# Patient Record
Sex: Female | Born: 1980
Health system: Southern US, Community
[De-identification: ages and names within clinical notes are randomized; demographics above are authoritative.]

## PROBLEM LIST (undated history)

## (undated) ENCOUNTER — Emergency Department (HOSPITAL_COMMUNITY): Discharge status: Absent without leave | Payer: Medicare Other

## (undated) DIAGNOSIS — F29 Unspecified psychosis not due to a substance or known physiological condition: Secondary | ICD-10-CM

## (undated) DIAGNOSIS — F419 Anxiety disorder, unspecified: Secondary | ICD-10-CM

## (undated) DIAGNOSIS — M549 Dorsalgia, unspecified: Secondary | ICD-10-CM

## (undated) DIAGNOSIS — R32 Unspecified urinary incontinence: Secondary | ICD-10-CM

## (undated) DIAGNOSIS — I4891 Unspecified atrial fibrillation: Secondary | ICD-10-CM

## (undated) DIAGNOSIS — F209 Schizophrenia, unspecified: Secondary | ICD-10-CM

## (undated) DIAGNOSIS — I499 Cardiac arrhythmia, unspecified: Secondary | ICD-10-CM

## (undated) DIAGNOSIS — F259 Schizoaffective disorder, unspecified: Secondary | ICD-10-CM

## (undated) DIAGNOSIS — F200 Paranoid schizophrenia: Secondary | ICD-10-CM

## (undated) DIAGNOSIS — G8929 Other chronic pain: Secondary | ICD-10-CM

## (undated) DIAGNOSIS — R440 Auditory hallucinations: Secondary | ICD-10-CM

## (undated) HISTORY — DX: Paranoid schizophrenia: F20.0

## (undated) HISTORY — DX: Unspecified psychosis not due to a substance or known physiological condition: F29

## (undated) HISTORY — DX: Schizoaffective disorder, unspecified: F25.9

## (undated) HISTORY — DX: Auditory hallucinations: R44.0

## (undated) HISTORY — DX: Unspecified atrial fibrillation: I48.91

---

## 2011-04-04 ENCOUNTER — Emergency Department (HOSPITAL_COMMUNITY)
Admission: EM | Admit: 2011-04-04 | Discharge: 2011-04-07 | Disposition: A | Discharge status: Other Acute Inpatient Hospital | Payer: Medicaid Other | Source: Home / Self Care | Attending: Emergency Medicine | Admitting: Emergency Medicine

## 2011-04-04 DIAGNOSIS — F29 Unspecified psychosis not due to a substance or known physiological condition: Secondary | ICD-10-CM | POA: Insufficient documentation

## 2011-04-04 LAB — DIFFERENTIAL
Basophils Absolute: 0 10*3/uL (ref 0.0–0.1)
Basophils Relative: 1 % (ref 0–1)
Eosinophils Absolute: 0.1 10*3/uL (ref 0.0–0.7)
Eosinophils Relative: 2 % (ref 0–5)
Monocytes Absolute: 0.4 10*3/uL (ref 0.1–1.0)
Monocytes Relative: 7 % (ref 3–12)

## 2011-04-04 LAB — URINE MICROSCOPIC-ADD ON

## 2011-04-04 LAB — BASIC METABOLIC PANEL
BUN: 12 mg/dL (ref 6–23)
Calcium: 10.1 mg/dL (ref 8.4–10.5)
GFR calc non Af Amer: >60 mL/min (ref >60)
Glucose, Bld: 103 mg/dL — ABNORMAL HIGH (ref 70–99)
Sodium: 135 mEq/L (ref 135–145)

## 2011-04-04 LAB — URINALYSIS, ROUTINE W REFLEX MICROSCOPIC
Specific Gravity, Urine: 1.006 (ref 1.005–1.030)
Urobilinogen, UA: 0.2 mg/dL (ref 0.0–1.0)
pH: 6.5 (ref 5.0–8.0)

## 2011-04-04 LAB — CBC
Hemoglobin: 14.6 g/dL (ref 12.0–15.0)
MCH: 30.2 pg (ref 26.0–34.0)
MCHC: 33.9 g/dL (ref 30.0–36.0)
Platelets: 219 10*3/uL (ref 150–400)
RDW: 12.6 % (ref 11.5–15.5)

## 2011-04-04 LAB — RAPID URINE DRUG SCREEN, HOSP PERFORMED
Opiates: NOT DETECTED
Tetrahydrocannabinol: NOT DETECTED

## 2011-04-06 ENCOUNTER — Emergency Department (HOSPITAL_COMMUNITY): Payer: Medicaid Other

## 2011-04-06 DIAGNOSIS — F29 Unspecified psychosis not due to a substance or known physiological condition: Secondary | ICD-10-CM

## 2011-04-07 ENCOUNTER — Inpatient Hospital Stay (HOSPITAL_COMMUNITY)
Admission: AD | Admit: 2011-04-07 | Discharge: 2011-04-14 | DRG: 885 | Disposition: A | Discharge status: Home / Self Care | Payer: Medicaid Other | Source: Ambulatory Visit | Attending: Psychiatry | Admitting: Psychiatry

## 2011-04-07 DIAGNOSIS — F29 Unspecified psychosis not due to a substance or known physiological condition: Secondary | ICD-10-CM

## 2011-04-07 DIAGNOSIS — Z733 Stress, not elsewhere classified: Secondary | ICD-10-CM

## 2011-04-07 NOTE — Consult Note (Addendum)
NAMEMILEIDY, ATKIN             ACCOUNT NO.:  [id number]  MEDICAL RECORD NO.:  [id number]  LOCATION:  WLED                         FACILITY:  [facility]  PHYSICIAN:  Shirin Echeverry T. Desani Sprung, M.D.   DATE OF BIRTH:  03/06/1981  DATE OF CONSULTATION: 04/06/11                                 CONSULTATION   HISTORY OF PRESENT ILLNESS:  The patient is 30 year old African-American female who is brought to the ED after the patient was experiencing severe psychosis and mother was concerned about her behavior. Apparently the patient has been hallucinating and talking to herself, believing that her baby daddy was killed by the family member.  The patient was given Geodon in the unit that helped somewhat.  However, the patient remains very psychotic, delusional and talking to herself.  The patient told that she has moved from [place] almost 4 months ago to live close to her mother who is in [place] and she believes that she has missed her injection, however, she mentioned that she missed her Depo injection so that she do not get pregnant.  She admitted since she has stopped the medication for past 3 months she is having lot of sleep issue.  She is scared, terrified and having nightmares.  The patient believes that lot of her family members have been doing conspiracy against her.  She has seen her baby daddy either killed or maybe tortured in detail.  The patient is very distraught, tearful and difficult to engage in the conversation.  The patient denies any previous psych history.  She does not recall seeing psychiatrist in the past.  PAST PSYCHIATRIC HISTORY:  At this time the patient is denying any previous psych history, however, the patient is very psychotic and did not provide much information.  MEDICAL HISTORY:  The patient endorsed sometimes she do get chest pain, however, there has been no documented active medical illness.  ALCOHOL AND SUBSTANCE USE HISTORY:  The patient's urine drug  screen was negative.  PSYCHOSOCIAL HISTORY:  The patient told that she was born in [place]. [place] but then she moved to [place] when she lived there for many years and recently she moved to [place] to live close to her mother.  The patient does not want to go to [place] as she remembers "bad things happen there."  The patient did not provide much information about her family history and psychosocial history.  MENTAL STATUS EXAM:  The patient is mildly obese female who is wearing the hospital PJs.  She is tearful, anxious and easily crying.  Her thought process was circumstantial and tangential.  She has poor concentration, poor attention.  She endorsed auditory hallucinations that people are calling her name.  She also endorsed paranoid delusions that her baby daddy has been killed by the family member or maybe he is tortured in the jail.  She is alert and oriented only x2.  She denies any suicidal thinking or homicidal thinking but appears very paranoid and psychotic.  At times she was seen talking to herself as responding to internal stimuli.  Her insight, judgment, impulse control is poor.  DIAGNOSIS:  AXIS I:  Psychosis, not otherwise specified. AXIS II:  Borderline  intellect. AXIS III:  See medical history. AXIS IV:  Severe. AXIS V:  20-25.  PLAN:  We will start the Haldol starting medication 5 mg twice a day. We will get increase collateral information.  At this time it is unclear if the patient has any previous psych history.  I would recommended her to have CT scan to rule out any organic cause of psychosis.  If she is medically cleared, we will admit this patient to [facility] for stabilization.  She will continue to get Geodon 20 mg IM for severe agitation.     Kathy Brown, M.D.     STA/MEDQ  D:  04/06/2011  T:  04/06/2011  Job:  [id number]  Electronically Signed by Kathryne Sharper M.D. on 04/07/2011 08:45:55 AM

## 2011-04-15 NOTE — Discharge Summary (Signed)
  NAMEKAMIA, INSALACO NO.:  [id number]  MEDICAL RECORD NO.:  [id number]  LOCATION:  0303                          FACILITY:  [facility]  PHYSICIAN:  Eulogio Ditch, MD DATE OF BIRTH:  1981/09/05  DATE OF ADMISSION:  04/07/2011 DATE OF DISCHARGE:  04/14/2011                              DISCHARGE SUMMARY   HISTORY OF PRESENT ILLNESS:  A 30 year old African American female who was admitted for hallucinations and paranoid behavior.  Patient was saying that family members are doing conspiracy against her.  HOSPITAL COURSE:  During the hospital stay, patient was started on 5 mg of Haldol which was increased to 10 mg at bedtime.  Patient was also given Cogentin 0.5 mg twice a day to prevent any EPS symptoms on the Haldol.  The patient responded to the medication well without any side effects.  The patient participated in all the groups.  No agitation was present during the hospital stay.  Patient's family was involved in the treatment.  At the time of discharge, the patient was not suicidal or homicidal, not delusional, not hallucinating.  She was very calm, cooperative, pleasant on approach.  Patient's insight improved and she was agreeable to take medications daily.  DISCHARGE DIAGNOSES:  AXIS I:  Psychosis, not otherwise specified. AXIS II:  Deferred. AXIS III:  No active medical issue. AXIS IV:  None. AXIS V:  50.  DISCHARGE MEDICATIONS: 1. Cogentin 0.5 mg at bedtime. 2. Haldol 10 mg at bedtime. 3. Ativan 1 mg 2 tablets at bedtime as needed for anxiety.  DISCHARGE FOLLOWUP:  She will follow with [facility], phone number [phone], appointment April 16, 2011, between 9 to 11 a.m.     Eulogio Ditch, MD     SA/MEDQ  D:  04/15/2011  T:  04/15/2011  Job:  [id number]  Electronically Signed by Eulogio Ditch  on 04/15/2011 09:58:36 AM

## 2011-04-26 ENCOUNTER — Emergency Department (HOSPITAL_COMMUNITY)
Admission: EM | Admit: 2011-04-26 | Discharge: 2011-04-29 | Disposition: A | Discharge status: Home / Self Care | Payer: Medicare Other | Attending: Emergency Medicine | Admitting: Emergency Medicine

## 2011-04-26 DIAGNOSIS — F29 Unspecified psychosis not due to a substance or known physiological condition: Secondary | ICD-10-CM

## 2011-04-26 DIAGNOSIS — Z79899 Other long term (current) drug therapy: Secondary | ICD-10-CM | POA: Insufficient documentation

## 2011-04-26 LAB — CK TOTAL AND CKMB (NOT AT ARMC)
CK, MB: 3 ng/mL (ref 0.3–4.0)
Relative Index: 1.2 (ref 0.0–2.5)
Total CK: 260 U/L — ABNORMAL HIGH (ref 7–177)

## 2011-04-26 LAB — POCT I-STAT, CHEM 8
BUN: 5 mg/dL — ABNORMAL LOW (ref 6–23)
Calcium, Ion: 1.16 mmol/L (ref 1.12–1.32)
Creatinine, Ser: 0.8 mg/dL (ref 0.50–1.10)
TCO2: 23 mmol/L (ref 0–100)

## 2011-04-26 LAB — RAPID URINE DRUG SCREEN, HOSP PERFORMED
Barbiturates: NOT DETECTED
Tetrahydrocannabinol: NOT DETECTED

## 2011-04-27 DIAGNOSIS — F29 Unspecified psychosis not due to a substance or known physiological condition: Secondary | ICD-10-CM

## 2011-05-02 ENCOUNTER — Inpatient Hospital Stay (HOSPITAL_COMMUNITY)
Admission: AD | Admit: 2011-05-02 | Discharge: 2011-05-02 | Disposition: A | Discharge status: Home / Self Care | Payer: Medicaid Other | Source: Ambulatory Visit | Attending: Obstetrics and Gynecology | Admitting: Obstetrics and Gynecology

## 2011-05-02 DIAGNOSIS — N938 Other specified abnormal uterine and vaginal bleeding: Secondary | ICD-10-CM | POA: Insufficient documentation

## 2011-05-02 DIAGNOSIS — N949 Unspecified condition associated with female genital organs and menstrual cycle: Secondary | ICD-10-CM | POA: Insufficient documentation

## 2011-05-02 LAB — CBC
HCT: 42.3 % (ref 36.0–46.0)
MCH: 30.5 pg (ref 26.0–34.0)
MCHC: 34 g/dL (ref 30.0–36.0)
MCV: 89.6 fL (ref 78.0–100.0)
RDW: 12.5 % (ref 11.5–15.5)

## 2011-05-02 LAB — WET PREP, GENITAL: Clue Cells Wet Prep HPF POC: NONE SEEN

## 2011-05-02 NOTE — H&P (Signed)
Kathy Brown, Kathy Brown NO.:  [id number]  MEDICAL RECORD NO.:  [id number]  LOCATION:  0405                          FACILITY:  [facility]  PHYSICIAN:  Vic Ripper, P.A.-C.DATE OF BIRTH:  [birth year]  DATE OF ADMISSION:  04/07/2011 DATE OF DISCHARGE:                      PSYCHIATRIC ADMISSION ASSESSMENT   This is a voluntary admission to the services of Dr. Rogers Blocker.  This is a 30 year old single [place] American female.  She was brought to the emergency department in florid psychosis.  She was hearing voices and seeing family members.  She was very paranoid, very concerned about her family members.  She also reported that her "baby's daddy" had been shot by her mother's boyfriend.  The patient's mother reported that the patient had been having delusional thinking along with auditory/visual hallucinations for about three days; this is new.  The patient's mother fears that she is a danger to others because she has been making threatening remarks and is not interpreting reality correctly.  The patient herself denied being suicidal.  PAST PSYCHIATRIC HISTORY:  There is no prior psych history as far as we know.  MEDICAL HISTORY:  She reports that occasionally she gets chest pain; however, there has been no documented active medical illness.  ALCOHOL AND DRUG HISTORY:  She denies any issues with substance abuse, and her UDS was negative.  PSYCHOSOCIAL HISTORY:  She was born in [place]. [place].  She then moved to [place], lived there for many years.  Recently has come to [place] to live close to her mother.  I am not sure what her child age is; despite asking her, she could not say.  MEDICATIONS:  She is not prescribed any.  MEDICAL PROBLEMS:  None are known.  DRUG ALLERGIES:  There are no known drug allergies.  POSITIVE PHYSICAL FINDINGS:  Well-developed, well-nourished African American female who appears her stated age.  She is, however, in distress  responding to auditory/visual hallucinations.  MENTAL STATUS EXAM:  Tonight the only clear conversation I could get out of her was that she wanted to shower.  She was drooling.  It was very difficult to understand her speech.  Dr. Lolly Mustache did interview her over in the emergency room.  She was in hospital pajamas.  She was tearful, anxious, labile, cried easily.  Her thought process was circumstantial and tangential.  She had poor concentration and attention.  However, she did acknowledge auditory hallucinations (people were calling her name). She also endorsed paranoid delusions that her baby's daddy had been killed by a family member or maybe he is being tortured in jail.  She was alert.  She was only oriented x2.  She denied any suicidal thinking or homicidal thinking, but she appeared very paranoid and psychotic. She was observed talking to herself and responding to internal stimuli. Her insight, judgment, impulse control were poor.  AXIS I:  First psychotic break, psychosis not otherwise specified. AXIS II:  Evaluate for borderline intellect. AXIS III:  She is reporting some chest pain but this could be anxiety. AXIS IV:  Severe. AXIS V:  20-25.  She was started on Haldol and Cogentin.  She was also ordered Geodon 20 mg IM for severe agitation.  To  my knowledge, she did not have to have that.  The plan is to admit for safety and stabilization, to adjust her meds as indicated.  We will have to get further data and see if her prehospitalization setting is appropriate once discharged.     Vic Ripper, P.A.-C.     MD/MEDQ  D:  04/07/2011  T:  04/08/2011  Job:  [id number]  Electronically Signed by Jaci Lazier ADAMS P.A.-C. on 04/10/2011 07:36:20 PM Electronically Signed by Eulogio Ditch  on 05/02/2011 07:31:16 AM

## 2011-05-03 LAB — GC/CHLAMYDIA PROBE AMP, GENITAL: Chlamydia, DNA Probe: NEGATIVE

## 2011-10-03 ENCOUNTER — Encounter: Payer: Self-pay | Admitting: *Deleted

## 2011-10-03 ENCOUNTER — Emergency Department (HOSPITAL_COMMUNITY)
Admission: EM | Admit: 2011-10-03 | Discharge: 2011-10-04 | Disposition: A | Discharge status: Home / Self Care | Payer: Medicare Other | Attending: Emergency Medicine | Admitting: Emergency Medicine

## 2011-10-03 DIAGNOSIS — R109 Unspecified abdominal pain: Secondary | ICD-10-CM | POA: Insufficient documentation

## 2011-10-03 DIAGNOSIS — R10816 Epigastric abdominal tenderness: Secondary | ICD-10-CM | POA: Insufficient documentation

## 2011-10-03 DIAGNOSIS — R1110 Vomiting, unspecified: Secondary | ICD-10-CM | POA: Insufficient documentation

## 2011-10-03 NOTE — ED Notes (Signed)
Pt in c/o abd pain with n/v x1 week

## 2011-10-04 ENCOUNTER — Emergency Department (HOSPITAL_COMMUNITY): Payer: Medicare Other

## 2011-10-04 ENCOUNTER — Encounter (HOSPITAL_COMMUNITY): Payer: Self-pay | Admitting: Emergency Medicine

## 2011-10-04 LAB — COMPREHENSIVE METABOLIC PANEL
AST: 17 U/L (ref 0–37)
Albumin: 4.2 g/dL (ref 3.5–5.2)
Alkaline Phosphatase: 62 U/L (ref 39–117)
Chloride: 100 mEq/L (ref 96–112)
Potassium: 3.6 mEq/L (ref 3.5–5.1)
Sodium: 139 mEq/L (ref 135–145)
Total Bilirubin: 0.4 mg/dL (ref 0.3–1.2)

## 2011-10-04 LAB — DIFFERENTIAL
Basophils Absolute: 0 10*3/uL (ref 0.0–0.1)
Basophils Relative: 1 % (ref 0–1)
Neutro Abs: 3.6 10*3/uL (ref 1.7–7.7)
Neutrophils Relative %: 56 % (ref 43–77)

## 2011-10-04 LAB — CBC
Hemoglobin: 14.6 g/dL (ref 12.0–15.0)
MCHC: 34.5 g/dL (ref 30.0–36.0)
RDW: 12.3 % (ref 11.5–15.5)

## 2011-10-04 MED ORDER — ONDANSETRON HCL 4 MG/2ML IJ SOLN
4.0000 mg | Freq: Once | INTRAMUSCULAR | Status: AC
  Administered 2011-10-04: 4 mg via INTRAVENOUS
  Filled 2011-10-04: qty 2

## 2011-10-04 MED ORDER — ONDANSETRON HCL 4 MG PO TABS: 4.0000 mg | ORAL_TABLET | Freq: Four times a day (QID) | ORAL | Status: AC

## 2011-10-04 MED ORDER — SODIUM CHLORIDE 0.9 % IV BOLUS (SEPSIS)
1000.0000 mL | Freq: Once | INTRAVENOUS | Status: AC
  Administered 2011-10-04: 1000 mL via INTRAVENOUS

## 2011-10-04 MED ORDER — HYDROMORPHONE HCL PF 1 MG/ML IJ SOLN
1.0000 mg | Freq: Once | INTRAMUSCULAR | Status: AC
  Administered 2011-10-04: 1 mg via INTRAVENOUS
  Filled 2011-10-04: qty 1

## 2011-10-04 MED ORDER — ONDANSETRON HCL 4 MG/2ML IJ SOLN
INTRAMUSCULAR | Status: AC
  Administered 2011-10-04: 4 mg
  Filled 2011-10-04: qty 2

## 2011-10-04 NOTE — ED Notes (Signed)
Ultrasound at bedside

## 2011-10-04 NOTE — ED Provider Notes (Signed)
History     CSN: [id number] Arrival date & time: 10/03/2011 11:04 PM   First MD Initiated Contact with Patient 10/04/11 0305      Chief Complaint  Patient presents with  . Emesis    (Consider location/radiation/quality/duration/timing/severity/associated sxs/prior treatment) Patient is a 30 y.o. female presenting with vomiting. The history is provided by the patient.  Emesis  This is a new problem. Episode onset: About a week ago. The problem occurs 2 to 4 times per day. The problem has not changed since onset.The emesis has an appearance of stomach contents. There has been no fever. Pertinent negatives include no abdominal pain, no arthralgias, no chills, no cough, no diarrhea, no fever, no headaches, no myalgias and no sweats. Risk factors: No recent travel or antibiotics or known sick contacts.   moderate in severity. Has some associated epigastric discomfort and no history of gallbladder problems. No radiation. No heartburn. No bloody or bilious emesis. Has not tried any medications for this and has no known aggravating or alleviating factors other than she is unable to hold anything down.  History reviewed. No pertinent past medical history.  History reviewed. No pertinent past surgical history.  History reviewed. No pertinent family history.  History  Substance Use Topics  . Smoking status: Never Smoker   . Smokeless tobacco: Not on file  . Alcohol Use: No    OB History    Grav Para Term Preterm Abortions TAB SAB Ect Mult Living                  Review of Systems  Constitutional: Negative for fever and chills.  HENT: Negative for neck pain and neck stiffness.   Eyes: Negative for pain.  Respiratory: Negative for cough and shortness of breath.   Cardiovascular: Negative for chest pain and palpitations.  Gastrointestinal: Positive for nausea and vomiting. Negative for abdominal pain, diarrhea, blood in stool and rectal pain.  Genitourinary: Negative for dysuria.    Musculoskeletal: Negative for myalgias, back pain and arthralgias.  Skin: Negative for rash.  Neurological: Negative for headaches.  All other systems reviewed and are negative.    Allergies  Review of patient's allergies indicates no known allergies.  Home Medications  No current outpatient prescriptions on file.  BP 109/50  Pulse 89  Temp(Src) 98.6 F (37 C) (Oral)  Resp 18  SpO2 96%  LMP 10/03/2011  Physical Exam  Constitutional: She is oriented to person, place, and time. She appears well-developed and well-nourished.  HENT:  Head: Normocephalic and atraumatic.  Eyes: Conjunctivae and EOM are normal. Pupils are equal, round, and reactive to light.  Neck: Trachea normal. Neck supple. No thyromegaly present.  Cardiovascular: Normal rate, regular rhythm, S1 normal, S2 normal and normal pulses.     No systolic murmur is present   No diastolic murmur is present  Pulses:      Radial pulses are 2+ on the right side, and 2+ on the left side.  Pulmonary/Chest: Effort normal and breath sounds normal. She has no wheezes. She has no rhonchi. She has no rales. She exhibits no tenderness.  Abdominal: Soft. Normal appearance and bowel sounds are normal. She exhibits no distension. There is no rebound, no guarding, no CVA tenderness and negative Murphy's sign.       Mild epigastric tenderness without any peritonitis  Musculoskeletal:       BLE:s Calves nontender, no cords or erythema, negative Homans sign  Neurological: She is alert and oriented to person, place, and  time. She has normal strength. No cranial nerve deficit or sensory deficit. GCS eye subscore is 4. GCS verbal subscore is 5. GCS motor subscore is 6.  Skin: Skin is warm and dry. No rash noted. She is not diaphoretic.  Psychiatric: Her speech is normal.       Cooperative and appropriate    ED Course  Procedures (including critical care time)  Labs Reviewed  COMPREHENSIVE METABOLIC PANEL - Abnormal; Notable for the  following:    Glucose, Bld 112 (*)    All other components within normal limits  CBC  DIFFERENTIAL  LIPASE, BLOOD   US Abdomen Complete  10/04/2011  *RADIOLOGY REPORT*  Clinical Data:  Right upper quadrant and epigastric pain with nausea and vomiting for 5 days.  COMPLETE ABDOMINAL ULTRASOUND  Comparison:  None  Findings:  Gallbladder:  No gallstones, gallbladder wall thickening, or pericholecystic fluid.  Common bile duct:  Normal caliber, with diameter measured at about 4 mm.  Only segmental visualization is available due to overlying bowel gas.  Liver:  Limited visualization with difficulty penetrating, possibly due to the patient's body habitus.  Infiltrative process such as fatty infiltration or cirrhosis are not excluded.  IVC:  Appears normal.  Pancreas:  The pancreas is mostly obscured by overlying bowel gas and is not well visualized.  Spleen:  Spleen measures 7.3 cm in length.  Parenchymal echotexture appears homogeneous.  Limited visualization due to overlying rib shadows.  Right Kidney:  Right kidney measures 10.8 cm length.  No hydronephrosis.  Left Kidney:  Left kidney measures 10.3 cm length.  No hydronephrosis.  Abdominal aorta:  Distal abdominal aorta is not visualized due to overlying bowel gas.  Visualized portions of the aorta demonstrate normal caliber.  IMPRESSION: Technically limited study due to the patient's body habitus and bowel gas.  No specific abnormality is identified.  Original Report Authenticated By: Marlon Pel, M.D.      MDM   IV fluids and Zofran provided. Serial ABD exams no peritonitis. Ultrasound obtained and gallbladder appears normal. Patient feeling much better at 7 AM and is requesting something to eat and she tolerates fluids well. She is improved and stable for discharge home and primary care followup as needed.        Sunnie Nielsen, MD 10/04/11 [phone]

## 2011-10-04 NOTE — ED Notes (Signed)
Pt vomitted after trying to eat.Small amount Zofran given

## 2011-10-04 NOTE — ED Notes (Signed)
Pt states she see Dr Stann Mainland at [facility]

## 2012-01-16 ENCOUNTER — Encounter (HOSPITAL_COMMUNITY): Payer: Self-pay | Admitting: Family Medicine

## 2012-01-16 ENCOUNTER — Emergency Department (HOSPITAL_COMMUNITY)
Admission: EM | Admit: 2012-01-16 | Discharge: 2012-01-16 | Disposition: A | Discharge status: Home / Self Care | Payer: Medicare Other | Attending: Emergency Medicine | Admitting: Emergency Medicine

## 2012-01-16 DIAGNOSIS — H9209 Otalgia, unspecified ear: Secondary | ICD-10-CM

## 2012-01-16 DIAGNOSIS — K0889 Other specified disorders of teeth and supporting structures: Secondary | ICD-10-CM

## 2012-01-16 DIAGNOSIS — K089 Disorder of teeth and supporting structures, unspecified: Secondary | ICD-10-CM | POA: Insufficient documentation

## 2012-01-16 MED ORDER — OXYCODONE-ACETAMINOPHEN 5-325 MG PO TABS
2.0000 | ORAL_TABLET | Freq: Once | ORAL | Status: AC
  Administered 2012-01-16: 2 via ORAL
  Filled 2012-01-16: qty 2

## 2012-01-16 MED ORDER — CARBAMIDE PEROXIDE 6.5 % OT SOLN
10.0000 [drp] | Freq: Once | OTIC | Status: AC
  Administered 2012-01-16: 10 [drp] via OTIC
  Filled 2012-01-16: qty 15

## 2012-01-16 NOTE — ED Provider Notes (Signed)
History     CSN: [id number]  Arrival date & time 01/16/12  Kathy Brown   First MD Initiated Contact with Patient 01/16/12 1943      No chief complaint on file.   (Consider location/radiation/quality/duration/timing/severity/associated sxs/prior treatment) HPI Comments: Patient reports that she has been having left upper dental pain and left ear pain for the past month.  She saw her dentist on 12/26/11 and was given a prescription for Amoxicillin and ibuprofen 800mg , which she reports that she is taking as directed.  She was referred to an oral surgeon in [facility], which she has not yet seen.  Patient is a 31 y.o. female presenting with tooth pain. The history is provided by the patient.  Dental PainThe primary symptoms include mouth pain. Primary symptoms do not include dental injury, oral bleeding, fever or shortness of breath. The symptoms are worsening.  Additional symptoms include: gum swelling, gum tenderness and ear pain. Additional symptoms do not include: purulent gums, trismus, facial swelling, trouble swallowing and drooling.    No past medical history on file.  No past surgical history on file.  No family history on file.  History  Substance Use Topics  . Smoking status: Never Smoker   . Smokeless tobacco: Not on file  . Alcohol Use: No    OB History    Grav Para Term Preterm Abortions TAB SAB Ect Mult Living                  Review of Systems  Constitutional: Negative for fever and chills.  HENT: Positive for ear pain. Negative for facial swelling, drooling, trouble swallowing, neck pain, neck stiffness and ear discharge.   Respiratory: Negative for shortness of breath.   Neurological: Negative for dizziness.    Allergies  Review of patient's allergies indicates no known allergies.  Home Medications   Current Outpatient Rx  Name Route Sig Dispense Refill  . AMOXICILLIN 500 MG PO CAPS Oral Take 500 mg by mouth 3 (three) times daily.    Marland Kitchen HALOPERIDOL 2 MG PO  TABS Oral Take 2 mg by mouth at bedtime.    . IBUPROFEN 800 MG PO TABS Oral Take 800 mg by mouth every 4 (four) hours as needed. Pain.      BP 145/86  Pulse 93  Temp(Src) 98.3 F (36.8 C) (Oral)  Resp 18  SpO2 96%  Physical Exam  Nursing note and vitals reviewed. Constitutional: She is oriented to person, place, and time. She appears well-developed and well-nourished. No distress.  HENT:  Head: Normocephalic and atraumatic. No trismus in the jaw.  Right Ear: Hearing, tympanic membrane, external ear and ear canal normal.  Left Ear: Hearing, tympanic membrane, external ear and ear canal normal.  Mouth/Throat: Uvula is midline, oropharynx is clear and moist and mucous membranes are normal. Abnormal dentition. No dental abscesses or uvula swelling. No oropharyngeal exudate, posterior oropharyngeal edema, posterior oropharyngeal erythema or tonsillar abscesses.       Poor dental hygiene. Pt able to open and close mouth with out difficulty. Airway intact. Uvula midline. Mild gingival swelling with tenderness over affected area, but no fluctuance. No swelling or tenderness of submental and submandibular regions.  Eyes: Conjunctivae and EOM are normal.  Neck: Normal range of motion and full passive range of motion without pain. Neck supple.  Cardiovascular: Normal rate, regular rhythm and normal heart sounds.   Pulmonary/Chest: Effort normal and breath sounds normal. No stridor. No respiratory distress. She has no wheezes.  Musculoskeletal: Normal  range of motion.  Lymphadenopathy:       Head (right side): No submental, no submandibular, no tonsillar, no preauricular and no posterior auricular adenopathy present.       Head (left side): No submental, no submandibular, no tonsillar, no preauricular and no posterior auricular adenopathy present.    She has no cervical adenopathy.  Neurological: She is alert and oriented to person, place, and time.  Skin: Skin is warm and dry. No rash noted. She  is not diaphoretic.    ED Course  Procedures (including critical care time)  Labs Reviewed - No data to display No results found.   No diagnosis found.  9:13 PM Patient's left ear completely occluded with wax.  Awaiting ear irrigation.    Ear irrigated and TM visualized.  Normal TM  MDM  Patient with toothache.  No gross abscess.  Exam unconcerning for Ludwig's angina or spread of infection.  Patient already prescribed Amoxicillin by dentist, therefore, patient not given any antibiotics today.  Urged patient to follow-up with oral surgeon as scheduled.         Pascal Lux Falman, PA-C 01/17/12 [phone]

## 2012-01-16 NOTE — Discharge Instructions (Signed)
You have a dental injury. Use the resource guide listed below to help you find a dentist if you do not already have one to followup with. It is very important that you get evaluated by a dentist as soon as possible. Call tomorrow to schedule an appointment. Use your pain medication as prescribed and do not operate heavy machinery while on pain medication. Note that your pain medication contains acetaminophen (Tylenol) & its is not reccommended that you use additional acetaminophen (Tylenol) while taking this medication. Take your full course of antibiotics. Read the instructions below.  Eat a soft or liquid diet and rinse your mouth out after meals with warm water. You should see a dentist or return here at once if you have increased swelling, increased pain or uncontrolled bleeding from the site of your injury.   SEEK MEDICAL CARE IF:   You have increased pain not controlled with medicines.   You have swelling around your tooth, in your face or neck.   You have bleeding which starts, continues, or gets worse.   You have a fever >101  If you are unable to open your mouth  RESOURCE GUIDE  Dental Problems  Patients with Medicaid: [facility]                     [facility] [address]                                           [address] Phone:  [phone]                                                  Phone:  [phone]  If unable to pay or uninsured, contact:  [facility] or [facility] to become qualified for the adult dental clinic.  Chronic Pain Problems Contact  [facility]  [phone] Patients need to be referred by their primary care doctor.  Insufficient Money for Medicine Contact [employer]:  call "211" or [facility] 271-5999.  No Primary Care Doctor Call [facility]  832-8000 Other agencies that provide inexpensive medical care    [facility]  832-8035    [facility]  832-7272    [facility]  [phone]    Women's Clinic  832-4777    [facility]  [phone]    [facility]  272-1050  Psychological Services [facility]  832-9600 [facility]  [phone] [facility]   800 853-5163 (emergency services 641-4993)  Substance Abuse Resources [facility]  336-882-2125 [facility] 336-784-9470 [facility] 336-285-9073 [facility] 336-845-3988 Residential & Outpatient Substance Abuse Program  800-659-3381  Abuse/Neglect [place] Child Abuse Hotline (336) 641-3795 [place] Child Abuse Hotline 800-378-5315 (After Hours)  Emergency Shelter [facility] (336) 271-5985  [facility] (336) 275-9566 [facility] (704) 372-4663  MRSA Hotline #:   832-7006    [place] Resources  [facility] [place]     [employer]                            [facility] [address] [place]                       [address]      [address]  [place]                                                [place]                            [place] Phone:  [phone]                                   Phone:  [phone]                 Phone:  [phone]  [facility] Phone:  342-8316  [place] Child Abuse Hotline (336) 342-1394 (336) 342-3537 (After Hours)    

## 2012-01-16 NOTE — ED Notes (Signed)
Patient states that her left ear has been hurting for "a month."

## 2012-01-18 NOTE — ED Provider Notes (Signed)
Medical screening examination/treatment/procedure(s) were performed by non-physician practitioner and as supervising physician I was immediately available for consultation/collaboration. Devoria Albe, MD, Armando Gang   Ward Givens, MD 01/18/12 Marlyne Beards

## 2012-07-29 ENCOUNTER — Emergency Department (HOSPITAL_COMMUNITY)
Admission: EM | Admit: 2012-07-29 | Discharge: 2012-07-29 | Disposition: A | Discharge status: Home Health | Payer: Medicare Other | Attending: Emergency Medicine | Admitting: Emergency Medicine

## 2012-07-29 ENCOUNTER — Encounter (HOSPITAL_COMMUNITY): Payer: Self-pay | Admitting: Emergency Medicine

## 2012-07-29 DIAGNOSIS — T85848A Pain due to other internal prosthetic devices, implants and grafts, initial encounter: Secondary | ICD-10-CM

## 2012-07-29 DIAGNOSIS — K029 Dental caries, unspecified: Secondary | ICD-10-CM | POA: Insufficient documentation

## 2012-07-29 DIAGNOSIS — K089 Disorder of teeth and supporting structures, unspecified: Secondary | ICD-10-CM | POA: Insufficient documentation

## 2012-07-29 MED ORDER — OXYCODONE-ACETAMINOPHEN 5-325 MG PO TABS
2.0000 | ORAL_TABLET | ORAL | Status: DC | PRN
Start: 2012-07-29 — End: 2013-05-20

## 2012-07-29 MED ORDER — PENICILLIN V POTASSIUM 500 MG PO TABS: 500.0000 mg | ORAL_TABLET | Freq: Four times a day (QID) | ORAL | Status: AC

## 2012-07-29 MED ORDER — OXYCODONE-ACETAMINOPHEN 5-325 MG PO TABS
2.0000 | ORAL_TABLET | Freq: Once | ORAL | Status: AC
  Administered 2012-07-29: 2 via ORAL
  Filled 2012-07-29: qty 2

## 2012-07-29 MED ORDER — CHLORHEXIDINE DIGLUCONATE 20 % SOLN: Status: DC

## 2012-07-29 NOTE — ED Provider Notes (Signed)
Medical screening examination/treatment/procedure(s) were performed by non-physician practitioner and as supervising physician I was immediately available for consultation/collaboration.   Kathy Brown. Osvaldo Lamping, MD 07/29/12 1411

## 2012-07-29 NOTE — ED Provider Notes (Signed)
History     CSN: [id number]  Arrival date & time 07/29/12  4098   First MD Initiated Contact with Patient 07/29/12 [phone]      Chief Complaint  Patient presents with  . Dental Pain    (Consider location/radiation/quality/duration/timing/severity/associated sxs/prior treatment) HPI Comments: Kathy Brown 31 y.o. female   The chief complaint is: Patient presents with:   Dental Pain    31 year old female the chief complaint of left sided maxillary pain. Tooth pain. Patient is currently on Haldol for unknown reasons. She is a very poor historian and has flat affect. Patient was seen here. This year for similar complaint and denies this. She says she does not remember. Patient states that she has had 2 days of worsening pain on the left side of her mouth. The maxillary region. She has poor dentition and multiple cavities broken teeth. Patient states that she is throbbing, constant pain on the left and some facial tenderness. Next to the nasal cavity. She denies any sore throat, difficulty swallowing, difficulty breathing. She does have some jaw pain, but no trismus. She denies otalgia. She had slight headache. Last night and difficulty sleeping due to pain. She denies fevers, chills, arthralgias, myalgias. She denies sinus pressure or pain. She denies chest pain or shortness of breath.    Patient is a 31 y.o. female presenting with tooth pain. The history is provided by the patient and medical records. The history is limited by the condition of the patient. No language interpreter was used.  Dental PainThe primary symptoms include mouth pain and headaches. Primary symptoms do not include dental injury, oral bleeding, oral lesions, fever, shortness of breath, sore throat, angioedema or cough. The symptoms began 2 days ago. The symptoms are worsening. The symptoms are recurrent. The symptoms occur constantly.  Mouth pain began 24 -48 hours ago. Mouth pain occurs constantly. Mouth pain is  worsening. Affected locations include: gum(s). At its highest the mouth pain was at 10/10. The mouth pain is currently at 10/10.  The headache developed gradually. The pain from the headache is at a severity of 4/10. Location/region(s) of the headache: left unilateral. Associated with: dental pain. The headache is not associated with aura, photophobia, eye pain, visual change, neck stiffness, paresthesias, weakness or loss of balance.  Additional symptoms include: dental sensitivity to temperature, gum swelling, gum tenderness, purulent gums, facial swelling and swollen glands. Additional symptoms do not include: trismus, jaw pain, trouble swallowing, pain with swallowing, excessive salivation, dry mouth, taste disturbance, smell disturbance, drooling, ear pain, hearing loss, nosebleeds, goiter and fatigue. Medical issues do not include: alcohol problem, smoking, chewing tobacco, immunosuppression, periodontal disease and cancer.    History reviewed. No pertinent past medical history.  History reviewed. No pertinent past surgical history.  No family history on file.  History  Substance Use Topics  . Smoking status: Never Smoker   . Smokeless tobacco: Not on file  . Alcohol Use: No    OB History    Grav Para Term Preterm Abortions TAB SAB Ect Mult Living                  Review of Systems  Constitutional: Negative for fever, chills and fatigue.  HENT: Positive for facial swelling. Negative for hearing loss, ear pain, nosebleeds, sore throat, drooling, trouble swallowing, neck stiffness and voice change.   Eyes: Negative for photophobia and pain.  Respiratory: Negative for cough and shortness of breath.   Cardiovascular: Negative for chest pain.  Gastrointestinal: Negative for  nausea, vomiting, abdominal pain and diarrhea.  Genitourinary: Negative for dysuria.  Musculoskeletal: Negative for myalgias, arthralgias and gait problem.  Neurological: Positive for headaches. Negative for  dizziness, weakness, light-headedness, paresthesias and loss of balance.    Allergies  Review of patient's allergies indicates no known allergies.  Home Medications   Current Outpatient Rx  Name Route Sig Dispense Refill  . ACETAMINOPHEN 500 MG PO TABS Oral Take 1,000 mg by mouth every 6 (six) hours as needed. For pain    . HALOPERIDOL 2 MG PO TABS Oral Take 2 mg by mouth at bedtime.    . CHLORHEXIDINE DIGLUCONATE 20 % SOLN  Swish for 30 seconds with one capful) after brushing your teeth, then spit. Repeat this twice a day, once in the morning and once at night after brushing your teeth. 100 mL 0  . OXYCODONE-ACETAMINOPHEN 5-325 MG PO TABS Oral Take 2 tablets by mouth every 4 (four) hours as needed for pain. 10 tablet 0  . PENICILLIN V POTASSIUM 500 MG PO TABS Oral Take 1 tablet (500 mg total) by mouth 4 (four) times daily. 40 tablet 0    BP 135/84  Pulse 103  Temp 99.1 F (37.3 C) (Oral)  Resp 18  SpO2 96%  Physical Exam  Constitutional: She is oriented to person, place, and time. She appears well-developed and well-nourished. No distress.  HENT:  Head: Normocephalic and atraumatic. No trismus in the jaw.  Mouth/Throat: Uvula is midline. No oral lesions. Abnormal dentition. Dental caries present. No dental abscesses, uvula swelling or lacerations.         Patient with multiple broken teeth. Dental caries and poor dentition. There is gingival erythema throughout. Patient is very tender to palpation of the comments along teeth 14 through 16. He is tender to palpation of the skin surface along the face. There are no areas of fluctuance noted. There is induration of the gum. Airway is clear and patent.  Eyes: Conjunctivae normal are normal. No scleral icterus.  Neck: Normal range of motion.  Cardiovascular: Normal rate, regular rhythm and normal heart sounds.  Exam reveals no gallop and no friction rub.   No murmur heard. Pulmonary/Chest: Effort normal and breath sounds normal. No  respiratory distress.  Abdominal: Soft. Bowel sounds are normal. She exhibits no distension and no mass. There is no tenderness. There is no guarding.  Musculoskeletal: Normal range of motion.  Lymphadenopathy:       Head (right side): Tonsillar adenopathy present.       Head (left side): Tonsillar adenopathy present.  Neurological: She is alert and oriented to person, place, and time. No cranial nerve deficit. Coordination normal.  Skin: Skin is warm and dry. She is not diaphoretic.    ED Course  Procedures (including critical care time)  Labs Reviewed - No data to display No results found.   1. Dental implant pain       MDM  Patient with multiple dental issues. I have given the patient. Oral oxycodone here in the hospital. I've also given her followup with a dentist and instructed that she should call within 24 hours of discharge. Patient seems to understand, however, due to  flat affect. It is unclear of her perceptions. I've also given the patient. Chlorhexidine and penicillin. Along with outpatient narcotics for pain relief. There is no sign of Ludwig's angina. Airway is patent. She is not running a fever. Believe her blood pressure and tachycardia were do to pain. Her discharge patient home with dental  followup        Arthor Captain, PA-C 07/29/12 1118

## 2012-07-29 NOTE — ED Notes (Signed)
Pt c/o left upper tooth and mouth pain with swelling X3d, denies fever, no airway compromise, no other complaints, NAD

## 2013-05-20 ENCOUNTER — Emergency Department (HOSPITAL_COMMUNITY)
Admission: EM | Admit: 2013-05-20 | Discharge: 2013-05-20 | Disposition: A | Discharge status: Home / Self Care | Payer: Medicare Other | Attending: Emergency Medicine | Admitting: Emergency Medicine

## 2013-05-20 ENCOUNTER — Encounter (HOSPITAL_COMMUNITY): Payer: Self-pay | Admitting: Emergency Medicine

## 2013-05-20 DIAGNOSIS — Z79899 Other long term (current) drug therapy: Secondary | ICD-10-CM | POA: Insufficient documentation

## 2013-05-20 DIAGNOSIS — F411 Generalized anxiety disorder: Secondary | ICD-10-CM | POA: Insufficient documentation

## 2013-05-20 DIAGNOSIS — M549 Dorsalgia, unspecified: Secondary | ICD-10-CM | POA: Insufficient documentation

## 2013-05-20 HISTORY — DX: Anxiety disorder, unspecified: F41.9

## 2013-05-20 MED ORDER — ONDANSETRON 4 MG PO TBDP
8.0000 mg | ORAL_TABLET | Freq: Once | ORAL | Status: AC
  Administered 2013-05-20: 8 mg via ORAL
  Filled 2013-05-20: qty 2

## 2013-05-20 MED ORDER — HYDROCODONE-ACETAMINOPHEN 5-325 MG PO TABS: 1.0000 | ORAL_TABLET | Freq: Four times a day (QID) | ORAL | Status: DC | PRN

## 2013-05-20 MED ORDER — PROMETHAZINE HCL 25 MG PO TABS: 25.0000 mg | ORAL_TABLET | Freq: Four times a day (QID) | ORAL | Status: DC | PRN

## 2013-05-20 MED ORDER — HYDROCODONE-ACETAMINOPHEN 5-325 MG PO TABS
2.0000 | ORAL_TABLET | Freq: Once | ORAL | Status: AC
  Administered 2013-05-20: 2 via ORAL
  Filled 2013-05-20: qty 2

## 2013-05-20 NOTE — ED Notes (Signed)
Patient came out of the room again and states "I need a doctor, I need something to eat".   Patient redirected back to room and advised that PA has to come in to see her first.

## 2013-05-20 NOTE — ED Notes (Signed)
Patient states that "I am ready for dinner now".   Patient advised nothing by mouth until after PA sees her.

## 2013-05-20 NOTE — ED Notes (Signed)
staes her 7 year has been sleeping with her and has been  Kicking her in the back and her duaghter has to sleep with her also and it makes her back hurt

## 2013-05-20 NOTE — ED Provider Notes (Signed)
CSN: [id number]     Arrival date & time 05/20/13  1247 History  This chart was scribed for non-physician practitioner working with Suzi Roots, MD, by Ardelia Mems ED Scribe. This patient was seen in room TR07C/TR07C and the patient's care was started at 3:09 PM.   None    Chief Complaint  Patient presents with  . Back Pain    The history is provided by the patient. No language interpreter was used.   HPI Comments: Kathy Brown is a 32 y.o. female who presents to the Emergency Department complaining of gradual onset, gradually worsening, constant, moderate, sharp, non-radiating back pain over the past week. She states that her two children have been sleeping in her bed and have been kicking her in her sleep, causing her this pain. She denies any injury or recent trauma, and she denies having a history of back pain. She states that she has tried nothing to relieve her pain. She did not drive to the ED. She denies illicit drug use or history of cancer. She denies bowel or bladder incontinence, tingling, numbness, fever or any other symptoms.  PCP- None  Past Medical History  Diagnosis Date  . Anxiety    No past surgical history on file. No family history on file. History  Substance Use Topics  . Smoking status: Never Smoker   . Smokeless tobacco: Not on file  . Alcohol Use: No   OB History   Grav Para Term Preterm Abortions TAB SAB Ect Mult Living                 Review of Systems  Constitutional: Negative for fever.  Gastrointestinal:       Denies bowel incontinence.  Genitourinary:       Denies bladder incontinence.  Musculoskeletal: Positive for back pain.  Neurological: Negative for numbness.       Denies tingling.  All other systems reviewed and are negative.    Allergies  Review of patient's allergies indicates no known allergies.  Home Medications   Current Outpatient Rx  Name  Route  Sig  Dispense  Refill  . benztropine (COGENTIN) 0.5 MG tablet    Oral   Take 0.5 mg by mouth 2 (two) times daily.         . haloperidol (HALDOL) 2 MG tablet   Oral   Take 2 mg by mouth at bedtime.          Triage Vitals: BP 137/79  Pulse 100  Temp(Src) 98.1 F (36.7 C)  Resp 16  SpO2 99%  Filed Vitals:   05/20/13 1537  BP: 119/86  Pulse: 83  Temp: 97.7 F (36.5 C)  Resp: 20     Physical Exam  Nursing note and vitals reviewed. Constitutional: She is oriented to person, place, and time. She appears well-developed and well-nourished. No distress.  HENT:  Head: Normocephalic and atraumatic.  Right Ear: External ear normal.  Left Ear: External ear normal.  Nose: Nose normal.  Mouth/Throat: Oropharynx is clear and moist.  Eyes: Conjunctivae are normal.  Neck: Normal range of motion.  Cardiovascular: Normal rate, regular rhythm, normal heart sounds and intact distal pulses.   Pulmonary/Chest: Effort normal and breath sounds normal. No stridor. No respiratory distress. She has no wheezes. She has no rales.  Abdominal: Soft. She exhibits no distension.  Musculoskeletal: Normal range of motion.       Cervical back: She exhibits tenderness and pain. She exhibits normal range of motion, no bony  tenderness and no deformity.       Thoracic back: She exhibits normal range of motion, no tenderness and no bony tenderness.       Lumbar back: She exhibits normal range of motion, no tenderness and no bony tenderness.  Tender to palpation in paraspinal muscles of back. Full ROM of neck.  Neurological: She is alert and oriented to person, place, and time. She has normal strength.  Strength normal.  Skin: Skin is warm and dry. She is not diaphoretic. No erythema.  Psychiatric: She has a normal mood and affect. Her behavior is normal.    ED Course   Procedures (including critical care time)  DIAGNOSTIC STUDIES: Oxygen Saturation is 99% on RA, normal by my interpretation.    COORDINATION OF CARE: 3:15 PM- Pt advised of plan for treatment with  Norco and Zofran in the ED, along with plan to receive prescriptions upon discharge and pt agrees.  Medications  HYDROcodone-acetaminophen (NORCO/VICODIN) 5-325 MG per tablet 2 tablet (not administered)  ondansetron (ZOFRAN-ODT) disintegrating tablet 8 mg (not administered)    Labs Reviewed - No data to display  No results found.  1. Back pain     MDM  Patient with back pain.  No neurological deficits and normal neuro exam.  Patient can walk.  No loss of bowel or bladder control.  No concern for cauda equina.  No fever, night sweats, weight loss, h/o cancer, IVDU.  RICE protocol and pain medicine indicated and discussed with patient.     I personally performed the services described in this documentation, which was scribed in my presence. The recorded information has been reviewed and is accurate.     Mora Bellman, PA-C 05/20/13 [phone]

## 2013-05-20 NOTE — ED Notes (Signed)
Brought patient to room and asked what brought her in.  She states that she has had upper back pain x 2 wks.   I asked patient if she had been injured any way, she started crying and said "not that I know of".   Patient asked if she had taken anything at home for her pain.  She advised "I don't have anything there".

## 2013-05-24 NOTE — ED Provider Notes (Signed)
Medical screening examination/treatment/procedure(s) were performed by non-physician practitioner and as supervising physician I was immediately available for consultation/collaboration.   Cassadie Pankonin E Xerxes Agrusa, MD 05/24/13 0832 

## 2014-01-13 ENCOUNTER — Emergency Department (HOSPITAL_COMMUNITY)
Admission: EM | Admit: 2014-01-13 | Discharge: 2014-01-13 | Disposition: A | Discharge status: Home / Self Care | Payer: Medicare Other | Attending: Emergency Medicine | Admitting: Emergency Medicine

## 2014-01-13 ENCOUNTER — Encounter (HOSPITAL_COMMUNITY): Payer: Self-pay | Admitting: Emergency Medicine

## 2014-01-13 DIAGNOSIS — Z79899 Other long term (current) drug therapy: Secondary | ICD-10-CM | POA: Insufficient documentation

## 2014-01-13 DIAGNOSIS — F419 Anxiety disorder, unspecified: Secondary | ICD-10-CM

## 2014-01-13 DIAGNOSIS — F411 Generalized anxiety disorder: Secondary | ICD-10-CM | POA: Insufficient documentation

## 2014-01-13 DIAGNOSIS — F39 Unspecified mood [affective] disorder: Secondary | ICD-10-CM | POA: Insufficient documentation

## 2014-01-13 DIAGNOSIS — R443 Hallucinations, unspecified: Secondary | ICD-10-CM | POA: Insufficient documentation

## 2014-01-13 DIAGNOSIS — Z760 Encounter for issue of repeat prescription: Secondary | ICD-10-CM | POA: Insufficient documentation

## 2014-01-13 MED ORDER — HALOPERIDOL 2 MG PO TABS: 2.0000 mg | ORAL_TABLET | Freq: Every day | ORAL | Status: DC

## 2014-01-13 MED ORDER — BENZTROPINE MESYLATE 0.5 MG PO TABS: 0.5000 mg | ORAL_TABLET | Freq: Two times a day (BID) | ORAL | Status: DC

## 2014-01-13 NOTE — ED Notes (Signed)
Per pt, states she has been out of meds and mother states she has been "hollering and acting crazy"-states she is a client at [facility] but has not been

## 2014-01-13 NOTE — ED Provider Notes (Signed)
Medical screening examination/treatment/procedure(s) were performed by non-physician practitioner and as supervising physician I was immediately available for consultation/collaboration.   EKG Interpretation None        Richardean Canalavid H Yao, MD 01/13/14 1550

## 2014-01-13 NOTE — Discharge Instructions (Signed)
Please call your doctor for a followup appointment within 24-48 hours. When you talk to your doctor please let them know that you were seen in the emergency department and have them acquire all of your records so that they can discuss the findings with you and formulate a treatment plan to fully care for your new and ongoing problems. Please call and set-up an appointment with your primary physician at [facility] to be seen within 24-48 hours Please call and set-up an appointment with [facility] to be seen and re-assessed within 24-48 hours Please take medications as prescribed Please rest and stay hydrated Please continue to monitor symptoms and if symptoms are to worsen or change (fever greater than 101, chills, sweating, nausea, vomiting, diarrhea, chest pain, shortness of breath, difficulty breathing, confusion, auditory or visual hallucinations, thoughts of hurting herself or thoughts of hurting others, increased depression) please report back to the ED immediately   Medication Refill, Emergency Department We have refilled your medication today as a courtesy to you. It is best for your medical care, however, to take care of getting refills done through your primary caregiver's office. They have your records and can do a better job of follow-up than we can in the emergency department. On maintenance medications, we often only prescribe enough medications to get you by until you are able to see your regular caregiver. This is a more expensive way to refill medications. In the future, please plan for refills so that you will not have to use the emergency department for this. Thank you for your help. Your help allows [place] to better take care of the daily emergencies that enter our department. Document Released: 01/30/2004 Document Revised: 01/05/2012 Document Reviewed: 10/13/2005 [facility] Patient Information 2014 [place], [place].   Emergency Department Resource Guide 1) Find a Doctor and Pay Out of  Pocket Although you won't have to find out who is covered by your insurance plan, it is a good idea to ask around and get recommendations. You will then need to call the office and see if the doctor you have chosen will accept you as a new patient and what types of options they offer for patients who are self-pay. Some doctors offer discounts or will set up payment plans for their patients who do not have insurance, but you will need to ask so you aren't surprised when you get to your appointment.  2) Contact Your Local Health Department Not all health departments have doctors that can see patients for sick visits, but many do, so it is worth a call to see if yours does. If you don't know where your local health department is, you can check in your phone book. The CDC also has a tool to help you locate your state's health department, and many state websites also have listings of all of their local health departments.  3) Find a Walk-in Clinic If your illness is not likely to be very severe or complicated, you may want to try a walk in clinic. These are popping up all over the country in pharmacies, drugstores, and shopping centers. They're usually staffed by nurse practitioners or physician assistants that have been trained to treat common illnesses and complaints. They're usually fairly quick and inexpensive. However, if you have serious medical issues or chronic medical problems, these are probably not your best option.  No Primary Care Doctor: - Call Health Connect at  [phone] - they can help you locate a primary care doctor that  accepts your insurance,  provides certain services, etc. - Physician Referral Service- [phone]  Chronic Pain Problems: Organization         Address  Phone   Notes  [facility]  [phone] Patients need to be referred by their primary care doctor.   Medication Assistance: Organization         Address  Phone   Notes  [facility]  Medication [facility] [address] [place] [place], [place] [address] [phone] --Must be a resident of [facility] -- Must have NO insurance coverage whatsoever (no Medicaid/ Medicare, etc.) -- The pt. MUST have a primary care doctor that directs their care regularly and follows them in the community   [facility]ist  [phone]   [employer]  [phone]    Agencies that provide inexpensive medical care: Organization         Address  Phone   Notes  [facility]  [phone]   [facility]    [phone]   [facility] [address] [place], [place] [address] [phone]   [facility] [address] [place] [phone]   Planned Parenthood    [phone]   [facility]    [phone]   [facility]  [address], [place] Phone:  [phone], Fax:  [phone] Hours of Operation:  9 am - 6 pm, M-F.  Also accepts Medicaid/Medicare and self-pay.  [facility]  [address], [place] Phone: [phone], Fax: [phone]. Hours of Operation:  8:30 am - 5:30 pm, M-F.  Also accepts Medicaid and self-pay.  [facility] [place] [address] [place] Phone: [phone]   [facility] [address] Natasha Bence [place], [place] [phone], Ext. [phone] Mondays & Thursdays: 7-9 AM.  First 15 patients are seen on a first come, first serve basis.    Medicaid-accepting [facility] [place] [facility] Providers:  Organization         Address  Phone   Notes  [facility] [address], [place] [phone] Also accepts self-pay patients.  [facility] [address] Laurell Josephs [employer], [place]  [phone]   [facility] [address], [place] [phone]   [facility] [address] [place] [phone]   Renaye Rakers [address], [place]   [phone] Only accepts [place] Access [place] patients after they have their name applied to their card.   Self-Pay (no insurance) in [facility]:  Organization         Address  Phone   Notes  Sickle Cell Patients, [facility] [address] [employer], [place] [phone]   [facility] [address] [place], [place] [phone]   [facility] [place]  [address], [place] [phone]   [facility]/Dr. Osei-Bonsu  [address], [place] or [address], [place] [phone] Phone number for both [place] and [place] locations is the same.  [facility]gent [facility] and [facility] [address] [facility] [phone]   [facility] [address] [place] or [address] [phone]   [facility] [address] [place] [phone], phone; [phone], fax Sees patients 1st and 3rd Saturday of every month.  Must not qualify for public or private insurance (i.e. Medicaid, Medicare, [place] Health Choice, Veterans' Benefits)  Household income should be no more than 200% of the poverty level The clinic cannot treat you if you are pregnant or think you are pregnant  Sexually transmitted diseases are not treated at the clinic.    Dental Care: Organization         Address  Phone  Notes  [facility] of [facility] [address] [place], [place] [phone] Accepts children up to age 18 who are enrolled in [place] or [place] Health Choice; pregnant women with a Medicaid card; and children who have applied for Medicaid or [place] Health Choice, but were declined, whose parents can pay a reduced fee at time of service.  [facility] Department of [facility]  [address], [place]  [phone] Accepts children up to age 50 who are enrolled in [place] or [place] Health Choice; pregnant women with a Medicaid card; and children who have applied for Medicaid or [place] Health Choice, but were declined, whose parents can pay a reduced fee at time of service.  [facility]  [address] [place], [place] [phone] Patients are seen by appointment only. Walk-ins are not accepted. [facility] will see patients 72 years of age and older. Monday - Tuesday (8am-5pm) Most Wednesdays (8:30-5pm) $30 per visit, cash only  [facility] Adult Dental Access PROGRAM  [address], [facility] [phone] Patients are seen by appointment only. Walk-ins are not accepted. [facility] will see patients 94 years of age and older. One Wednesday Evening (Monthly: Volunteer Based).  $30 per visit, cash only  [employer]  [phone] for adults; Children under age 25, call Graduate Pediatric Dentistry at [phone]. Children aged 70-14, please call [phone] to request a pediatric application.  Dental services are provided in all areas of dental care including fillings, crowns and bridges, complete and partial dentures, implants, gum treatment, root canals, and extractions. Preventive care is also provided. Treatment is provided to both adults and children. Patients are selected via a lottery and there is often a waiting list.   [facility] [address], [place]  [phone] [email or web address]   [facility] [address] [place], [place] [phone], Ext. [phone] Second and Fourth Thursday of each month, opens at 6:30 AM; Clinic ends at 9 AM.  Patients are seen on a first-come first-served basis, and a limited number are seen during each clinic.   [facility] [place] [facility]  [address] Ether Griffins [place], [place] [phone]   Eligibility Requirements You must have lived in [place], [place], or [place]  counties for at least the last three months.   You cannot be eligible for state or federal sponsored [place], including [employer], [place], or [employer].   You generally cannot be eligible for healthcare insurance through your employer.    How to apply: Eligibility screenings are held every Tuesday and Wednesday afternoon from 1:00 pm until 4:00 pm. You do not need an appointment for the interview!  [facility] [address] [place], [place] [phone]   [facility] Health Department  [phone]   [facility] Health Department  [phone]   [facility] Health Department  [phone]  Behavioral Health Resources in the Community: Intensive Outpatient Programs Organization         Address  Phone  Notes  [facility] [address], [place], [place] [phone]   [facility] [address], [place], [place] [phone]   [facility] [address], [place], [place]  [phone]   [facility] [address],  [place], [place] [phone] or [phone]   Substance Abuse Resources Organization         Address  Phone  Notes  [facility]  [phone]   [facility]  [phone]   [facility]  [phone]   [facility]  [phone]   Residential & Outpatient Substance Abuse Program  [phone]   Psychological Services Organization         Address  Phone  Notes  [facility]  [phone]- [phone]   [facility]  [phone]- [phone]   [facility] [address], [place] [phone] or [phone]    Mobile Crisis Teams Organization         Address  Phone  Notes  [facility], Mobile Crisis Care Unit  [phone]   [facility]  [address] [place], [place] [phone]   Doristine LocksSharon DeEsch [address] [place]  [place] [phone]    Self-Help/Support Groups Organization         Address  Phone             Notes  Mental Health Assoc. of  - variety of support groups  [phone] Call for more information  Narcotics Anonymous (NA), [facility] [address], [place]  2 meetings at this location   Statisticianesidential Treatment Programs Organization         Address  Phone  Notes  [facility] [address],    [place]  [phone]   [facility]  [address], [place] [phone], [place], [place] [phone]   [facility] [address], [place] [phone] Admissions: 8am-3pm M-F  [facility] [address]    [place], [place] [phone]   [facility] [address], [place], [place] [phone]   [facility] [address]  [place], [place] [phone]   [facility] - Intensive Outpatient [address], [place], [place] [phone]   [facility] ([facility] [address]  [place], [place] [phone] or [phone]   [facility] ([facility]) [address] [place], [place] [phone] Accepts Medicaid  [facility] [address]  [place] [phone] Substance Abuse/Addiction Treatment   [facility] Resources Organization         Address  Phone  Notes  [facility]  [phone]) [phone]   Angie FavaJulie Brannon, PhD [address] [place], [place]   [phone](336) [phone] or [phone]) [phone]   [facility]   [address] [place], [place] [phone]) [phone]   [facility] [address], [place], [place] [phone](336) [phone] Insurance/Medicaid/sponsorship through [employer] [facility]  [facility] [address]                                    [place], [place] [phone]) [phone] Therapy/tele-psych/case  [facility] [address]   [place],  [place] [phone]    Dr. Lolly Mustache  [phone]   [facility] [place]  [employer] [facility] Dept. 1) [address] [place] 2) [address] [place] 3)  [address], [place] [phone]  [phone]   [facility] Child Abuse Hotline ([phone] or [phone] (After Hours)

## 2014-01-13 NOTE — ED Notes (Signed)
Pt escorted to discharge window. Verbalized understanding discharge instructions. In no acute distress.   

## 2014-01-13 NOTE — ED Provider Notes (Signed)
CSN: [id number]     Arrival date & time 01/13/14  4098 History   First MD Initiated Contact with Patient 01/13/14 1007     Chief Complaint  Patient presents with  . Medical Clearance     (Consider location/radiation/quality/duration/timing/severity/associated sxs/prior Treatment) The history is provided by the patient and a parent. No language interpreter was used.  Kathy Brown is a 33 y/o F with PMHx of anxiety presenting to the ED with mother who reported that patient was seen by a nurse yesterday and was told to come into the ED to get assessed and be an inpatient regarding her psychological issues. Patient reported that she is on Haldol and has not taken her medications in over a month. Mother reported that the patient lives with her and stated that she has been acting out, hollering, yelling, and reported people coming after her. Mother reported that she had an episode yesterday and this morning. Mother reported that this is normal to have these episodes when she is not on her medications for a long period of time. Patient reported that she is a patient at [facility] and [place]. When asked if she has reached out to any of these places to be seen regarding her medications to be refilled, both patient and mother declined. Denied homicidal ideation, suicidal ideation, chest pain, shortness of breath, difficulty breathing, abdominal pain, nausea, vomiting, diarrhea. PCP Barney Drain  Past Medical History  Diagnosis Date  . Anxiety    History reviewed. No pertinent past surgical history. No family history on file. History  Substance Use Topics  . Smoking status: Never Smoker   . Smokeless tobacco: Not on file  . Alcohol Use: No   OB History   Grav Para Term Preterm Abortions TAB SAB Ect Mult Living                 Review of Systems  Constitutional: Negative for fever and chills.  Respiratory: Negative for chest tightness and shortness of breath.   Cardiovascular: Negative  for chest pain.  Gastrointestinal: Negative for nausea, vomiting, abdominal pain and diarrhea.  Neurological: Negative for weakness and headaches.  Psychiatric/Behavioral: Positive for hallucinations and dysphoric mood. Negative for suicidal ideas and self-injury.  All other systems reviewed and are negative.      Allergies  Review of patient's allergies indicates no known allergies.  Home Medications   Current Outpatient Rx  Name  Route  Sig  Dispense  Refill  . benztropine (COGENTIN) 0.5 MG tablet   Oral   Take 1 tablet (0.5 mg total) by mouth 2 (two) times daily.   20 tablet   0   . haloperidol (HALDOL) 2 MG tablet   Oral   Take 1 tablet (2 mg total) by mouth at bedtime.   10 tablet   0   . HYDROcodone-acetaminophen (NORCO/VICODIN) 5-325 MG per tablet   Oral   Take 1 tablet by mouth every 6 (six) hours as needed for pain.   6 tablet   0   . promethazine (PHENERGAN) 25 MG tablet   Oral   Take 1 tablet (25 mg total) by mouth every 6 (six) hours as needed for nausea.   12 tablet   0    BP 128/88  Pulse 77  Temp(Src) 98.2 F (36.8 C) (Oral)  Resp 16  SpO2 100%  LMP 12/16/2013 Physical Exam  Nursing note and vitals reviewed. Constitutional: She is oriented to person, place, and time. She appears well-developed and well-nourished. No  distress.  HENT:  Head: Normocephalic and atraumatic.  Mouth/Throat: Oropharynx is clear and moist. No oropharyngeal exudate.  Eyes: Conjunctivae and EOM are normal. Pupils are equal, round, and reactive to light. Right eye exhibits no discharge. Left eye exhibits no discharge.  Neck: Normal range of motion. Neck supple. No tracheal deviation present.  Negative neck stiffness Negative nuchal rigidity Negative cervical lymphadenopathy  Cardiovascular: Normal rate, regular rhythm and normal heart sounds.  Exam reveals no friction rub.   No murmur heard. Pulmonary/Chest: Effort normal and breath sounds normal. No respiratory  distress. She has no wheezes. She has no rales.  Patient able to speak in full sentences without difficulty Negative use of accessory muscles  Abdominal: Soft. Bowel sounds are normal. There is no tenderness. There is no guarding.  Musculoskeletal: Normal range of motion.  Full ROM to upper and lower extremities without difficulty noted, negative ataxia noted.  Lymphadenopathy:    She has no cervical adenopathy.  Neurological: She is alert and oriented to person, place, and time. No cranial nerve deficit. She exhibits normal muscle tone. Coordination normal.  Cranial nerves III-XII grossly intact Strength 5+/5+ to upper and lower extremities bilaterally with resistance applied, equal distribution noted  Skin: Skin is warm and dry. No rash noted. She is not diaphoretic. No erythema.  Psychiatric:  Flat affect  Monotone Low toned speech     ED Course  Procedures (including critical care time)  10:23 AM This provider spoke with Dr. Cheri Rous regarding case. As per physician, agreed that patient does not need to go through the process of medical clearance - reported that since patient in no HI/SI patient can be discharged with medications and referrals. As per physician, patient to be discharged with medications.   10:49 AM This provider discussed plan for discharge in great detail with patient and mother. Patient and mother understood. Mother reported that she will hold the medications and administer the medication since patient does not take them on her own. Patient reports that she normally does not take them on her own even when she has medication filled. Discussed with patient importance of taking her own medications. Discussed importance of following up with an outpatient. Patient denied suicidal ideation, homicidal ideation, thoughts of self injury.  Labs Review Labs Reviewed - No data to display Imaging Review No results found.   EKG Interpretation None      MDM   Final  diagnoses:  Medication refill  Anxiety   Filed Vitals:   01/13/14 0935  BP: 128/88  Pulse: 77  Temp: 98.2 F (36.8 C)  TempSrc: Oral  Resp: 16  SpO2: 100%    Patient presenting to the ED, with mother regarding patient having outbursts of hollering, yelling, people chasing after her that has been ongoing for the past month. Mother reported that patient has been out of her medications for over a month. Patient reported that she is a patient at [facility] and [facility] - stated that she has not reached out to either facilities regarding medications to be refilled.  Alert and oriented. GCS 15. Heart rate and rhythm normal. Lungs clear to auscultation to upper and lower lobes bilaterally. Radial and DP pulses 2+ bilaterally. Bowel sounds are normoactive in all 4 quadrants-soft upon palpation-benign abdominal exam. Full range of motion to upper and lower extremities bilaterally without difficulty noted. Strength intact to equal grip strength. Flat affect. Monotone, low toned speech. Patient presenting to the ED with request for medication refill-mother reported that nurse  that saw the patient yesterday recommended patient to be admitted as an inpatient. Patient has not been taking her medications for almost a month-patient is [facility], mother and patient reported that neither of these facilities have been called or notified regarding medications or to be reassessed. Patient denies homicidal and suicidal ideation. This provider spoke with attending physician who agreed with patient to be discharged with medications and referrals. Patient stable, afebrile. Patient appears not to be a harm to herself or others. Will discharge patient with medications - haldol and cogentin that patient has been on for years, as per patient and mother report. Discussed with patient to [facility] with [facility]-phone numbers given in discharge paperwork. Discussed with patient  to followup. Discussed with patient to rest and stay hydrated. Discussed with patient importance of medication maintenance. Discussed with patient to closely monitor symptoms and if symptoms are to worsen or change to report back to the ED - strict return instructions given.  Patient agreed to plan of care, understood, all questions answered.   Raymon MuttonMarissa Aftin Lye, PA-C 01/13/14 [address]1113  Tela Kotecki, PA-C 01/13/14 1118

## 2014-01-29 ENCOUNTER — Emergency Department (HOSPITAL_COMMUNITY)
Admission: EM | Admit: 2014-01-29 | Discharge: 2014-01-29 | Disposition: A | Discharge status: Home / Self Care | Payer: Medicare Other | Attending: Emergency Medicine | Admitting: Emergency Medicine

## 2014-01-29 DIAGNOSIS — F22 Delusional disorders: Secondary | ICD-10-CM | POA: Insufficient documentation

## 2014-01-29 DIAGNOSIS — Z79899 Other long term (current) drug therapy: Secondary | ICD-10-CM | POA: Insufficient documentation

## 2014-01-29 DIAGNOSIS — F411 Generalized anxiety disorder: Secondary | ICD-10-CM | POA: Insufficient documentation

## 2014-01-29 DIAGNOSIS — F209 Schizophrenia, unspecified: Secondary | ICD-10-CM | POA: Insufficient documentation

## 2014-01-29 DIAGNOSIS — F259 Schizoaffective disorder, unspecified: Secondary | ICD-10-CM

## 2014-01-29 LAB — CBC
HCT: 39.3 % (ref 36.0–46.0)
HEMOGLOBIN: 13 g/dL (ref 12.0–15.0)
MCH: 29.6 pg (ref 26.0–34.0)
MCHC: 33.1 g/dL (ref 30.0–36.0)
MCV: 89.5 fL (ref 78.0–100.0)
PLATELETS: 253 10*3/uL (ref 150–400)
RBC: 4.39 MIL/uL (ref 3.87–5.11)
RDW: 14 % (ref 11.5–15.5)
WBC: 6.9 10*3/uL (ref 4.0–10.5)

## 2014-01-29 LAB — BASIC METABOLIC PANEL
BUN: 8 mg/dL (ref 6–23)
CALCIUM: 9.6 mg/dL (ref 8.4–10.5)
CO2: 28 meq/L (ref 19–32)
Chloride: 100 mEq/L (ref 96–112)
Creatinine, Ser: 0.75 mg/dL (ref 0.50–1.10)
GFR calc Af Amer: >90 mL/min (ref >90)
GFR calc non Af Amer: >90 mL/min (ref >90)
GLUCOSE: 101 mg/dL — AB (ref 70–99)
POTASSIUM: 4.3 meq/L (ref 3.7–5.3)
SODIUM: 139 meq/L (ref 137–147)

## 2014-01-29 LAB — RAPID URINE DRUG SCREEN, HOSP PERFORMED
Amphetamines: NOT DETECTED
BENZODIAZEPINES: NOT DETECTED
Barbiturates: NOT DETECTED
COCAINE: NOT DETECTED
OPIATES: NOT DETECTED
TETRAHYDROCANNABINOL: NOT DETECTED

## 2014-01-29 LAB — ETHANOL: Alcohol, Ethyl (B): <11 mg/dL (ref 0–11)

## 2014-01-29 MED ORDER — LORAZEPAM 1 MG PO TABS: 1.0000 mg | ORAL_TABLET | Freq: Three times a day (TID) | ORAL | Status: DC | PRN

## 2014-01-29 MED ORDER — IBUPROFEN 200 MG PO TABS: 600.0000 mg | ORAL_TABLET | Freq: Three times a day (TID) | ORAL | Status: DC | PRN

## 2014-01-29 MED ORDER — ACETAMINOPHEN 325 MG PO TABS: 650.0000 mg | ORAL_TABLET | ORAL | Status: DC | PRN

## 2014-01-29 MED ORDER — ONDANSETRON HCL 4 MG PO TABS: 4.0000 mg | ORAL_TABLET | Freq: Three times a day (TID) | ORAL | Status: DC | PRN

## 2014-01-29 MED ORDER — NICOTINE 21 MG/24HR TD PT24: 21.0000 mg | MEDICATED_PATCH | Freq: Every day | TRANSDERMAL | Status: DC

## 2014-01-29 MED ORDER — ZOLPIDEM TARTRATE 5 MG PO TABS: 5.0000 mg | ORAL_TABLET | Freq: Every evening | ORAL | Status: DC | PRN

## 2014-01-29 MED ORDER — ALUM & MAG HYDROXIDE-SIMETH 200-200-20 MG/5ML PO SUSP: 30.0000 mL | ORAL | Status: DC | PRN

## 2014-01-29 NOTE — ED Provider Notes (Signed)
CSN: [id number]     Arrival date & time 01/29/14  0825 History   First MD Initiated Contact with Patient 01/29/14 [phone]     Chief Complaint  Patient presents with  . Medical Clearance     (Consider location/radiation/quality/duration/timing/severity/associated sxs/prior Treatment) HPI Pt presenting with c/o feeling paranoid.  Pt called 911 she states that she hasn't been able to sleep in several days.  She states she feels that someone will come into her house.  She states that yesterday she was talking with neighbors in the yard and a car came speeding up her driveway.  She also talks of being suspicious of [employer] coming in front of her house.  Pt denies having auditory hallucinations.  Denies SI/HI.  States she has been very stressed and cannot sleep.  She denies substance use.  Per her mother patient has been more paranoid, and mother has been keeping her children away from her because she feels patient is not doing well.  Denies recent illness, no fever, no vomiting or diarrhea, no cough. There are no other associated systemic symptoms, there are no other alleviating or modifying factors.   Past Medical History  Diagnosis Date  . Anxiety    No past surgical history on file. No family history on file. History  Substance Use Topics  . Smoking status: Never Smoker   . Smokeless tobacco: Not on file  . Alcohol Use: No   OB History   Grav Para Term Preterm Abortions TAB SAB Ect Mult Living                 Review of Systems ROS reviewed and all otherwise negative except for mentioned in HPI    Allergies  Review of patient's allergies indicates no known allergies.  Home Medications   Current Outpatient Rx  Name  Route  Sig  Dispense  Refill  . benztropine (COGENTIN) 0.5 MG tablet   Oral   Take 1 tablet (0.5 mg total) by mouth 2 (two) times daily.   20 tablet   0   . haloperidol (HALDOL) 2 MG tablet   Oral   Take 1 tablet (2 mg total) by mouth at bedtime.   10 tablet   0     BP 136/86  Pulse 98  Temp(Src) 98.3 F (36.8 C) (Oral)  Resp 16  SpO2 98%  LMP 12/16/2013 Vitals reviewed Physical Exam Physical Examination: General appearance - alert, well appearing, and in no distress Mental status - alert, oriented to person, place, and time Eyes - no conjunctival injection, no scleral icterus Mouth - mucous membranes moist, pharynx normal without lesions Chest - clear to auscultation, normal respiratory effort Heart - normal rate, regular rhythm, normal S1, S2, no murmurs, rubs, clicks or gallops Extremities - peripheral pulses normal, no pedal edema, no clubbing or cyanosis Skin - normal coloration and turgor, no rashes Psych- tangential thoughts, poor concentration, cooperative  ED Course  Procedures (including critical care time) Labs Review Labs Reviewed  BASIC METABOLIC PANEL - Abnormal; Notable for the following:    Glucose, Bld 101 (*)    All other components within normal limits  CBC  URINE RAPID DRUG SCREEN (HOSP PERFORMED)  ETHANOL   Imaging Review No results found.   EKG Interpretation None      MDM   Final diagnoses:  Paranoia  Schizophrenic disorder    Pt presenting with paranoia, decreased sleep.  No SI/HI.  Pt is medically cleared, psych holding orders written.  Pt seen  by psych NP who has discharged patient.  [facility].      Ethelda ChickMartha K Linker, MD 01/29/14 [phone]

## 2014-01-29 NOTE — ED Notes (Signed)
Pt's mother has been called to come pick her up multiple times.  States that she wants us to "call 911 and have them take her home".  After it was explained how EMS works, pt's mother stated that she would have pt's brother pick her up in 30 mins.

## 2014-01-29 NOTE — Consult Note (Signed)
[facility] Face-to-Face Psychiatry Consult   Reason for Consult:  Schizophrenia Referring Physician:  EDP  Kathy Brown is an 33 y.o. female. Total Time spent with patient: 45 minutes  Assessment: AXIS I:  Schizoaffective Disorder AXIS II:  Deferred AXIS III:   Past Medical History  Diagnosis Date  . Anxiety    AXIS IV:  other psychosocial or environmental problems AXIS V:  61-70 mild symptoms  Plan:  No evidence of imminent risk to self or others at present.   Patient does not meet criteria for psychiatric inpatient admission. Supportive therapy provided about ongoing stressors. Discussed crisis plan, support from social network, calling 911, coming to the Emergency Department, and calling Suicide Hotline.  Subjective:   Kathy Brown is a 33 y.o. female patient admitted with Schizoaffective Disorder.  HPI:  Patient states that she lives with her mom and her 3 kids. "They not home last night.  They stay at grandma's house so they can go to church together.  I was at home by myself. I don't like to stay there by myself."  Patient denies suicidal/homicidal ideation and psychosis. Patient does states that she feel like someone will come into home and get her when she is alone.  Patient stated that she has a history of hearing voices but is not hearing any at this time.  When asked "No I don't hear nothing but you (interviewer); No I just see the TV."  Patient states that she is taking her medication and is compliant; Mom helps with medication. Patient mother called and states that she is willing to pick patient up.  HPI Elements:   Location:  Paranoia. Quality:  Afraid to be home alone   . Severity:  Afraid to be home alone and called police. Timing:  Last night.  Past Psychiatric History: Past Medical History  Diagnosis Date  . Anxiety     reports that she has never smoked. She does not have any smokeless tobacco history on file. She reports that she does not drink alcohol or use  illicit drugs. No family history on file.         Allergies:  No Known Allergies  ACT Assessment Complete:  No:   Past Psychiatric History: Diagnosis:  Paranoia and history of Schizophrenia  Hospitalizations:  Yes  Outpatient Care:  Yes  Substance Abuse Care:  Denies  Self-Mutilation:  Denies  Suicidal Attempts:  Denies  Homicidal Behaviors:  Denies   Violent Behaviors:  Denies    Place of Residence:  [place] Marital Status:  Single Employed/Unemployed:  Unemployed/Disability Education:   Family Supports:  Mother Objective: Blood pressure 122/79, pulse 96, temperature 98.3 F (36.8 C), temperature source Oral, resp. rate 14, last menstrual period 12/16/2013, SpO2 96.00%.There is no height or weight on file to calculate BMI. Results for orders placed during the hospital encounter of 01/29/14 (from the past 72 hour(s))  CBC     Status: None   Collection Time    01/29/14  9:54 AM      Result Value Ref Range   WBC 6.9  4.0 - 10.5 K/uL   RBC 4.39  3.87 - 5.11 MIL/uL   Hemoglobin 13.0  12.0 - 15.0 g/dL   HCT 39.3  36.0 - 46.0 %   MCV 89.5  78.0 - 100.0 fL   MCH 29.6  26.0 - 34.0 pg   MCHC 33.1  30.0 - 36.0 g/dL   RDW 14.0  11.5 - 15.5 %   Platelets 253  150 -  400 K/uL  BASIC METABOLIC PANEL     Status: Abnormal   Collection Time    01/29/14  9:54 AM      Result Value Ref Range   Sodium 139  137 - 147 mEq/L   Potassium 4.3  3.7 - 5.3 mEq/L   Chloride 100  96 - 112 mEq/L   CO2 28  19 - 32 mEq/L   Glucose, Bld 101 (*) 70 - 99 mg/dL   BUN 8  6 - 23 mg/dL   Creatinine, Ser 0.75  0.50 - 1.10 mg/dL   Calcium 9.6  8.4 - 10.5 mg/dL   GFR calc non Af Amer >90  >90 mL/min   GFR calc Af Amer >90  >90 mL/min   Comment: (NOTE)     The eGFR has been calculated using the CKD EPI equation.     This calculation has not been validated in all clinical situations.     eGFR's persistently <90 mL/min signify possible Chronic Kidney     Disease.  URINE RAPID DRUG SCREEN (HOSP  PERFORMED)     Status: None   Collection Time    01/29/14  9:54 AM      Result Value Ref Range   Opiates NONE DETECTED  NONE DETECTED   Cocaine NONE DETECTED  NONE DETECTED   Benzodiazepines NONE DETECTED  NONE DETECTED   Amphetamines NONE DETECTED  NONE DETECTED   Tetrahydrocannabinol NONE DETECTED  NONE DETECTED   Barbiturates NONE DETECTED  NONE DETECTED   Comment:            DRUG SCREEN FOR MEDICAL PURPOSES     ONLY.  IF CONFIRMATION IS NEEDED     FOR ANY PURPOSE, NOTIFY LAB     WITHIN 5 DAYS.                LOWEST DETECTABLE LIMITS     FOR URINE DRUG SCREEN     Drug Class       Cutoff (ng/mL)     Amphetamine      1000     Barbiturate      200     Benzodiazepine   846     Tricyclics       659     Opiates          300     Cocaine          300     THC              50  ETHANOL     Status: None   Collection Time    01/29/14  9:54 AM      Result Value Ref Range   Alcohol, Ethyl (B) <11  0 - 11 mg/dL   Comment:            LOWEST DETECTABLE LIMIT FOR     SERUM ALCOHOL IS 11 mg/dL     FOR MEDICAL PURPOSES ONLY   Labs are reviewed and are pertinent for no critical findings.  Medication reviewed and no changes made  Current Facility-Administered Medications  Medication Dose Route Frequency Provider Last Rate Last Dose  . acetaminophen (TYLENOL) tablet 650 mg  650 mg Oral Q4H PRN Threasa Beards, MD      . alum & mag hydroxide-simeth (MAALOX/MYLANTA) 200-200-20 MG/5ML suspension 30 mL  30 mL Oral PRN Threasa Beards, MD      . ibuprofen (ADVIL,MOTRIN) tablet 600 mg  600 mg Oral Q8H PRN  Threasa Beards, MD      . LORazepam (ATIVAN) tablet 1 mg  1 mg Oral Q8H PRN Threasa Beards, MD      . nicotine (NICODERM CQ - dosed in mg/24 hours) patch 21 mg  21 mg Transdermal Daily Threasa Beards, MD      . ondansetron [facility]) tablet 4 mg  4 mg Oral Q8H PRN Threasa Beards, MD      . zolpidem (AMBIEN) tablet 5 mg  5 mg Oral QHS PRN Threasa Beards, MD       Current Outpatient  Prescriptions  Medication Sig Dispense Refill  . benztropine (COGENTIN) 0.5 MG tablet Take 1 tablet (0.5 mg total) by mouth 2 (two) times daily.  20 tablet  0  . haloperidol (HALDOL) 2 MG tablet Take 1 tablet (2 mg total) by mouth at bedtime.  10 tablet  0    Psychiatric Specialty Exam:     Blood pressure 122/79, pulse 96, temperature 98.3 F (36.8 C), temperature source Oral, resp. rate 14, last menstrual period 12/16/2013, SpO2 96.00%.There is no height or weight on file to calculate BMI.  General Appearance: Disheveled  Eye Contact::  Good  Speech:  Clear and Coherent and Slow  Volume:  Normal  Mood:  "I'm tired"  Affect:  Depressed  Thought Process:  "I don't like to be in the house by myself"  Orientation:  Other:  Person and place  Thought Content:  Paranoid Ideation  Suicidal Thoughts:  No  Homicidal Thoughts:  No  Memory:  Immediate;   Good Recent;   Fair Remote;   Fair  Judgement:  Fair  Insight:  Lacking  Psychomotor Activity:  Normal  Concentration:  Poor  Recall:  [employer] of Knowledge:Fair  Language: Fair  Akathisia:  No  Handed:  Right  AIMS (if indicated):     Assets:  Desire for Improvement Housing Social Support  Sleep:      Musculoskeletal: Strength & Muscle Tone: within normal limits Gait & Station: normal Patient leans: Right  Treatment Plan Summary: Follow up with outpatient provider  Disposition:  Discharge home.  Patient to follow with her primary psych provider.  Discharge Assessment     Demographic Factors:  NA  Total Time spent with patient: 15 minutes  Psychiatric Specialty Exam: Same as above  Musculoskeletal: Same as above  Mental Status Per Nursing Assessment::   On Admission:     Current Mental Status by Physician: Patient denies suicidal/homicidal ideation and psychosis  Loss Factors: NA  Historical Factors: History of schizophrenia   Risk Reduction Factors:   Responsible for children under 10 years of age  and Living with another person, especially a relative  Continued Clinical Symptoms:  Schizophrenia:   Paranoid or undifferentiated type  Cognitive Features That Contribute To Risk:  Loss of executive function    Suicide Risk:  Minimal: No identifiable suicidal ideation.  Patients presenting with no risk factors but with morbid ruminations; may be classified as minimal risk based on the severity of the depressive symptoms  Discharge Diagnoses:  Same as above  Plan Of Care/Follow-up recommendations:  Activity:  Resume usual activity Diet:  Resume usual diet  Is patient on multiple antipsychotic therapies at discharge:  No   Has Patient had three or more failed trials of antipsychotic monotherapy by history:  No  Recommended Plan for Multiple Antipsychotic Therapies: NA  Rankin, Shuvon, FNP-BC 01/29/2014 11:48 AM  Patient was seen face-to-face for psychiatric evaluation  and examination, suicide risk assessment case discussed with treatment team, physician extender and made appropriate disposition plan. Reviewed the information documented and agree with the treatment plan.  Mar Zettler,JANARDHAHA R. 01/29/2014 3:33 PM

## 2014-01-29 NOTE — ED Notes (Addendum)
Pt escorted to discharge window. Verbalized understanding discharge instructions. In no acute distress.   
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
## 2014-01-29 NOTE — Discharge Instructions (Signed)
Hallucinations and Delusions You seem to be having hallucinations and/or delusions. You may be hearing voices that no one else can hear. This can seem very real to you. You may be having thoughts and fears that do not make sense to others. This condition can be due to mental disease like schizophrenia. It may be caused by a medical condition, such as an infection or electrolyte disturbance. These symptoms are also seen in drug abusers, especially those who use crack cocaine and amphetamines. Drugs like PCP, LSD, MDMA, peyote, and psilocybin can also cause frightening hallucinations and loss of control. If your symptoms are due to drug abuse, your mental state should improve as the drug(s) leave your system. Someone you trust should be with you until you are better to protect you and calm your fears. Often tranquilizers are very helpful at controlling hallucinations, anxiety, and destructive behavior. Getting a proper diet and enough sleep is important to recovery. If your symptoms are not due to drugs, or do not improve over several days after stopping drug use, you need further medical or mental health care. SEEK IMMEDIATE MEDICAL CARE IF:   Your symptoms get worse, especially if you think your life is in danger  You have violent or destructive thoughts. Recovery is possible, but you have to get proper treatment and avoid drugs that are known to cause you trouble. Document Released: 11/20/2004 Document Revised: 01/05/2012 Document Reviewed: 10/13/2005 [facility] Patient Information 2014 [place], [place].  Schizophrenia Schizophrenia is a mental illness. It may cause disturbed or disorganized thinking, speech, or behavior. People with schizophrenia have problems functioning in one or more areas of life: work, school, home, or relationships. People with schizophrenia are at increased risk for suicide, certain chronic physical illnesses, and unhealthy behaviors, such as smoking and drug use. People who have  family members with schizophrenia are at higher risk of developing the illness. Schizophrenia affects men and women equally but usually appears at an earlier age (teenage or early adult years) in men.  SYMPTOMS The earliest symptoms are often subtle (prodrome) and may go unnoticed until the illness becomes more severe (first-break psychosis). Symptoms of schizophrenia may be continuous or may come and go in severity. Episodes often are triggered by major life events, such as family stress, college, [employer], marriage, pregnancy or child birth, divorce, or loss of a loved one. People with schizophrenia may see, hear, or feel things that do not exist (hallucinations). They may have false beliefs in spite of obvious proof to the contrary (delusions). Sometimes speech is incoherent or behavior is odd or withdrawn.  DIAGNOSIS Schizophrenia is diagnosed through an assessment by your caregiver. Your caregiver will ask questions about your thoughts, behavior, mood, and ability to function in daily life. Your caregiver may ask questions about your medical history and use of alcohol or drugs, including prescription medication. Your caregiver may also order blood tests and imaging exams. Certain medical conditions and substances can cause symptoms that resemble schizophrenia. Your caregiver may refer you to a mental health specialist for evaluation. There are three major criterion for a diagnosis of schizophrenia:  Two or more of the following five symptoms are present for a month or longer:  Delusions. Often the delusions are that you are being attacked, harassed, cheated, persecuted or conspired against (persecutory delusions).  Hallucinations.   Disorganized speech that does not make sense to others.  Grossly disorganized (confused or unfocused) behavior or extremely overactive or underactive motor activity (catatonia).  Negative symptoms such as bland  or blunted emotions (flat affect), loss of will  power (avolition), and withdrawal from social contacts (social isolation).  Level of functioning in one or more major areas of life (work, school, relationships, or self-care) is markedly below the level of functioning before the onset of illness.   There are continuous signs of illness (either mild symptoms or decreased level of functioning) for at least 6 months or longer. TREATMENT  Schizophrenia is a long-term illness. It is best controlled with continuous treatment rather than treatment only when symptoms occur. The following treatments are used to manage schizophrenia:  Medication Medication is the most effective and important form of treatment for schizophrenia. Antipsychotic medications are usually prescribed to help manage schizophrenia. Other types of medication may be added to relieve any symptoms that may occur despite the use of antipsychotic medications.  Counseling or talk therapy Individual, group, or family counseling may be helpful in providing education, support, and guidance. Many people with schizophrenia also benefit from social skills and job skills (vocational) training. A combination of medication and counseling is best for managing the disorder over time. A procedure in which electricity is applied to the brain through the scalp (electroconvulsive therapy) may be used to treat catatonic schizophrenia or schizophrenia in people who cannot take or do not respond to medication and counseling. Document Released: 10/10/2000 Document Revised: 06/15/2013 Document Reviewed: 01/05/2013 [facility] Patient Information 2014 [place], [place].  Paranoia Paranoia is a distrust of others that is not based on a real reason for distrust. This may reach delusional levels. This means the paranoid person feels the world is against them when there is no reason to make them feel that way. People with paranoia feel as though people around them are "out to get them".  SIMILAR MENTAL  ILNESSES  Depression is a feeling as though you are down all the time. It is normal in some situations where you have just lost a loved one. It is abnormal if you are having feelings of paranoia with it.  Dementia is a physical problem with the brain in which the brain no longer works properly. There are problems with daily activities of living. Alzheimer's disease is one example of this. Dementia is also caused by old age changes in the brain which come with the death of brain cells and small strokes.  Paranoidschizophrenia. People with paranoid schizophrenia and persecutory delusional disorder have delusions in which they feel people around them are plotting against them. Persecutory delusions in paranoid schizophrenia are bizarre, sometimes grandiose, and often accompanied by auditory hallucinations. This means the person is hearing voices that are not there.  Delusionaldisorder (persecutory type). Delusions experienced by individuals with delusional disorder are more believable than those experienced by paranoid schizophrenics; they are not bizarre, though still unjustified. Individuals with delusional disorder may seem offbeat or quirky rather than mentally ill, and therefore, may never seek treatment. All of these problems usually do not allow these people to interact socially in an acceptable manner. CAUSES The cause of paranoia is often not known. It is common in people with extended abuse of:  Cocaine.  Amphetamine.  Marijuana.  Alcohol. Sometimes there is an inherited tendency. It may be associated with stress or changes in brain chemistry. DIAGNOSIS  When paranoia is present, your caregiver may:  Refer you to a specialist.  Do a physical exam.  Perform other tests on you to make sure there are not other problems causing the paranoia including:  Physical problems.  Mental problems.  Chemical problems (  other than drugs). Testing may be done to determine if there is a  psychiatric disability present that can be treated with medicine. TREATMENT   Paranoia that is a symptom of a psychiatric problem should be treated by professionals.  Medicines are available which can help this disorder. Antipsychotic medicine may be prescribed by your caregiver.  Sometimes psychotherapy may be useful.  Conditions such as depression or drug abuse are treated individually. If the paranoia is caused by drug abuse, a treatment facility may be helpful. Depression may be helped by antidepressants. PROGNOSIS   Paranoid people are difficult to treat because of their belief that everyone is out to get them or harm them. Because of this mistrust, they often must be talked into entering treatment by a trusted family member or friend. They may not want to take medicine as they may see this as an attempt to poison them.  Gradual gains in the trust of a therapist or caregiver helps in a successful treatment plan.  Some people with PPD or persecutory delusional disorder function in society without treatment in limited fashion. Document Released: 10/16/2003 Document Revised: 01/05/2012 Document Reviewed: 06/20/2008 [facility] Patient Information 2014 [place], [place].

## 2014-01-29 NOTE — ED Notes (Signed)
MOM--[name] [phone] Linwood DibblesGRANDMOTHER--JOANN Bores [phone]

## 2014-01-29 NOTE — ED Notes (Signed)
Per EMS: Pt from home.  Called 911.  When police got there, found the pt to be confused and anxious.  Pt was crying and talking about how she could not sleep.  States that she doesn't know where her family is. Got out her phone, showed a number that is labled as "mom" but she says that it is her grandmother that lives in [place].  Thinks that she thinks her family left her and went down to [place].  Has 3 kids that she states are with her mother.  Before EMS arrived, [name] asked if she had a phone, pt denied but when EMS arrived and asked the same, she pulled it out of her coat pocket.  Pt has medications (2 identical bottles of 60 tabs of 1 mg haldol that was filled on 01/23/14 (6 days ago).  1 bottle has 50 tabs missing.  The other bottle is full.  Pt also has a bottle of 0.5 mg tabs of cogentin.  There were 9 missing from that bottle (if she was taking it appropriately, there should be 12 missing).

## 2014-01-29 NOTE — ED Notes (Signed)
Pt states that her children were with her yesterday but now she does not know where they are and that they could be in [place] or [place].  States that her children have passports and could be leaving the country on an [place].  When asked about why there are 50 haldol tabs missing, pt states that she does not know.  Pt appears very confused.  Denies SI/HI.

## 2014-03-08 ENCOUNTER — Emergency Department (HOSPITAL_COMMUNITY)
Admission: EM | Admit: 2014-03-08 | Discharge: 2014-03-08 | Disposition: A | Discharge status: Home / Self Care | Payer: Medicare Other | Attending: Emergency Medicine | Admitting: Emergency Medicine

## 2014-03-08 ENCOUNTER — Encounter (HOSPITAL_COMMUNITY): Payer: Self-pay | Admitting: Emergency Medicine

## 2014-03-08 DIAGNOSIS — Z8659 Personal history of other mental and behavioral disorders: Secondary | ICD-10-CM | POA: Insufficient documentation

## 2014-03-08 DIAGNOSIS — J45909 Unspecified asthma, uncomplicated: Secondary | ICD-10-CM | POA: Insufficient documentation

## 2014-03-08 DIAGNOSIS — G4700 Insomnia, unspecified: Secondary | ICD-10-CM | POA: Insufficient documentation

## 2014-03-08 DIAGNOSIS — Z79899 Other long term (current) drug therapy: Secondary | ICD-10-CM | POA: Insufficient documentation

## 2014-03-08 NOTE — ED Notes (Signed)
Pt. Reports she takes haloperidol and cogentin daily but  can't sleep at night. States "My mom keeps arguing with me and I need something to help me sleep at night".
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
## 2014-03-08 NOTE — ED Notes (Signed)
Pt states the medications she was prescribed by her PCP for sleep that is not working.

## 2014-03-08 NOTE — Discharge Instructions (Signed)
You can try taking 25 mg Benadryl to help you sleep at night but will need to discuss this with your regular doctor.   Insomnia Insomnia is frequent trouble falling and/or staying asleep. Insomnia can be a long term problem or a short term problem. Both are common. Insomnia can be a short term problem when the wakefulness is related to a certain stress or worry. Long term insomnia is often related to ongoing stress during waking hours and/or poor sleeping habits. Overtime, sleep deprivation itself can make the problem worse. Every little thing feels more severe because you are overtired and your ability to cope is decreased. CAUSES   Stress, anxiety, and depression.  Poor sleeping habits.  Distractions such as TV in the bedroom.  Naps close to bedtime.  Engaging in emotionally charged conversations before bed.  Technical reading before sleep.  Alcohol and other sedatives. They may make the problem worse. They can hurt normal sleep patterns and normal dream activity.  Stimulants such as caffeine for several hours prior to bedtime.  Pain syndromes and shortness of breath can cause insomnia.  Exercise late at night.  Changing time zones may cause sleeping problems (jet lag). It is sometimes helpful to have someone observe your sleeping patterns. They should look for periods of not breathing during the night (sleep apnea). They should also look to see how long those periods last. If you live alone or observers are uncertain, you can also be observed at a sleep clinic where your sleep patterns will be professionally monitored. Sleep apnea requires a checkup and treatment. Give your caregivers your medical history. Give your caregivers observations your family has made about your sleep.  SYMPTOMS   Not feeling rested in the morning.  Anxiety and restlessness at bedtime.  Difficulty falling and staying asleep. TREATMENT   Your caregiver may prescribe treatment for an underlying medical  disorders. Your caregiver can give advice or help if you are using alcohol or other drugs for self-medication. Treatment of underlying problems will usually eliminate insomnia problems.  Medications can be prescribed for short time use. They are generally not recommended for lengthy use.  Over-the-counter sleep medicines are not recommended for lengthy use. They can be habit forming.  You can promote easier sleeping by making lifestyle changes such as:  Using relaxation techniques that help with breathing and reduce muscle tension.  Exercising earlier in the day.  Changing your diet and the time of your last meal. No night time snacks.  Establish a regular time to go to bed.  Counseling can help with stressful problems and worry.  Soothing music and white noise may be helpful if there are background noises you cannot remove.  Stop tedious detailed work at least one hour before bedtime. HOME CARE INSTRUCTIONS   Keep a diary. Inform your caregiver about your progress. This includes any medication side effects. See your caregiver regularly. Take note of:  Times when you are asleep.  Times when you are awake during the night.  The quality of your sleep.  How you feel the next day. This information will help your caregiver care for you.  Get out of bed if you are still awake after 15 minutes. Read or do some quiet activity. Keep the lights down. Wait until you feel sleepy and go back to bed.  Keep regular sleeping and waking hours. Avoid naps.  Exercise regularly.  Avoid distractions at bedtime. Distractions include watching television or engaging in any intense or detailed activity like attempting  to balance the household checkbook.  Develop a bedtime ritual. Keep a familiar routine of bathing, brushing your teeth, climbing into bed at the same time each night, listening to soothing music. Routines increase the success of falling to sleep faster.  Use relaxation techniques.  This can be using breathing and muscle tension release routines. It can also include visualizing peaceful scenes. You can also help control troubling or intruding thoughts by keeping your mind occupied with boring or repetitive thoughts like the old concept of counting sheep. You can make it more creative like imagining planting one beautiful flower after another in your backyard garden.  During your day, work to eliminate stress. When this is not possible use some of the previous suggestions to help reduce the anxiety that accompanies stressful situations. MAKE SURE YOU:   Understand these instructions.  Will watch your condition.  Will get help right away if you are not doing well or get worse. Document Released: 10/10/2000 Document Revised: 01/05/2012 Document Reviewed: 11/10/2007 [facility] Patient Information 2014 [place].   RESOURCE GUIDE  Chronic Pain Problems: Contact [facility]  [phone] Patients need to be referred by their primary care doctor.  Insufficient Money for Medicine: Contact [employer]:  call "211" or [place] 6293853147.  No Primary Care Doctor: Call Health Connect  [phone] - can help you locate a primary care doctor that  accepts your insurance, provides certain services, etc. Physician Referral Service- [phone]  Agencies that provide inexpensive medical care: [facility]  [facility]  [phone] [facility]  [phone] [place] [facility]  [phone] Planned Parenthood  [phone] [facility]  [phone]  [place] Providers: [facility]- [address], [address]  [phone], Mon-Fri 9am-7pm, Sat 9am-1pm [place], [address] [place]  [place], [address] [place]  [place]- [address]  [place], [address], [phone]  Only accepts [place] patients after they have their name  applied to their card  Self Pay (no insurance) in [facility]: Sickle Cell Patients: Dr Kevan Ny, [facility]  [place], [facility] [place]  [facility] [place]- [address], [facility]- see information above (Speak to [employer] if you do not have insurance)       -  [facility]- [place], [place],  [place]       -  [place] [address], [phone]       -  Dr Vista Lawman-  [address], [address], [place], [facility]- [address] [phone]       -  [facility] - [address], [place], also [address], [phone]       -    [facility]- [address], [place], 1st & 3rd Saturday   every month, 10am-1pm  1) Find a Tax adviser and Pay Out of [place]  Although you won't have to find out who is covered by your insurance plan, it is a good idea to ask around and get recommendations. You will then need to call the office and see if the doctor you have chosen will accept you as a new patient and what types of options they offer for patients who are self-pay. Some doctors offer discounts or will set up payment plans for their patients who do not have insurance, but you will need to ask so you aren't surprised when you get to your appointment.  2) Contact Your Local Health Department Not all health departments have doctors that can see patients for sick visits, but many do, so it is worth a call to see if yours does. If you don't know where your local health department is, you can check in your phone book. The CDC also has a tool to help you locate your state's health  department, and many state websites also have listings of all of their local health departments.  3) Find a [facility] If your illness is not likely to be very severe or complicated, you may want to try a walk in clinic. These are popping up all over the country in pharmacies, drugstores, and shopping centers. They're usually staffed by nurse practitioners or physician assistants that have been trained to treat common illnesses and complaints. They're usually fairly quick and inexpensive. However, if you have serious medical issues or chronic medical problems, these are probably not your best option  [facility] [place], [facility], [address] [place], phone [phone] or [phone].  Monday - Friday, call for an appointment. [place], [facility], [address], [place], phone [phone] or [phone].  Monday - Friday, call for an appointment.  Abuse/Neglect: [place] [phone] [place] [phone] (After Hours)  Emergency Shelter:  [facility] [phone]  Maternity Homes: Room at the [place] [phone] [place] [phone]  MRSA Hotline #:   (971)694-2048  [facility] [place] Dept. [address]         [place] Phone:  [phone]                                  Phone:  [phone]                   Phone:  [place], [place] in [place], [address]  [phone], [place] [phone] or [phone] (After Hours)   [place]  Substance Abuse Resources: Alcohol and Drug Services  [place] [phone] [facility] [place] Chinita Pester [phone] Residential & Outpatient Substance Abuse Program  [phone]  Psychological Services: [place]  [phone] [place]  [place], [place] [address] [place], [place]: [phone] or [phone], [email or web address]  Dental Assistance  If unable to pay or uninsured, contact:  [facility] or [facility]. to become qualified for the adult dental clinic.  Patients with Medicaid: [facility] [phone] [address] [place], [place] [address] [phone]  If unable to pay, or uninsured, contact [facility] [phone]) or [place] [phone] in [place], [place] in [facility]) to become qualified for the adult dental clinic  Other [place]- [place], [place], [place], [address], [place], [place], 2nd and 4th Thursday of the month at 6:30am.  10 clients each day by appointment, can sometimes see walk-in patients if someone does not show for an appointment. [facility] [address] Hillard Danker [place], [place], [address], [place], [place], [place], [address], [place] [facility] [phone]  Please make every effort to establish with a primary care physician for routine medical care  [place]  The [place] provides a wide range of adult health services. Some of these  services are designed to address the healthcare needs of all [facility] residents and all services are designed to meet the needs of uninsured/underinsured low income residents. Some services are available to any resident of [place], call [phone] for details. ] The [facility], a new medical clinic for adults, is now open. For more information about the Center and its services please call [phone]. For information on our [place] services, click here.  For more information on any of the following Department of Public Health programs, including hours of service, click on the highlighted link.  SERVICES FOR WOMEN (Adults and Teens) [employer] provide a full range of birth control options plus education and counseling. New patient visit and annual return visits include a complete examination, pap test as indicated, and other laboratory as indicated. Included is our [employer] for men.  Maternity Care is provided through pregnancy, including a six week post partum exam. Women who meet eligibility criteria for the Medicaid for Pregnant Women program, receive care free. Other women are charged on a sliding scale according to income. Note: [facility] provides services to pregnant women who have a Medicaid card. Call [phone] for an appointment in [place] or [phone] for an appointment in [facility].  Primary Care for Medicaid [place] Access Women is available through the [place]. As primary care provider for the [place] program, women may designate the [facility] clinic as their primary care provider.  PLEASE CALL [phone] FOR AN APPOINTMENT FOR THE ABOVE SERVICES IN EITHER [place] OR [place]. Information available in [place] and [place].   Childbirth Education Classes are open to the public and offered to help families  prepare for the best possible childbirth experience as well as to promote lifelong health and wellness. Classes are offered  throughout the year and meet on the same night once a week for five weeks. Medicaid covers the cost of the classes for the mother-to-be and her partner. For participants without Medicaid, the cost of the class series is $45.00 for the mother-to-be and her partner. Class size is limited and registration is required. For more information or to register call [phone]. Baby items donated by [employer] and the [employer] [place] are given away during each class series.  SERVICES FOR WOMEN AND MEN Sexually Transmitted Infection appointments, including HIV testing, are available daily (weekdays, except holidays). Call early as same-day appointments are limited. For an appointment in either [facility] or [place], call [phone]. Services are confidential and free of charge.  Skin Testing for Tuberculosis Please call [phone]. Adult Immunizations are available, usually for a fee. Please call [phone] for details.  PLEASE CALL [phone] FOR AN APPOINTMENT FOR THE ABOVE SERVICES IN EITHER [place] OR [place].   [facility] provides up to the minute recommended vaccines for your travel destination. We also provide essential health and political information to help insure a safe and pleasurable travel experience. This program is self-sustaining, however, fees are very competitive. We are a CERTIFIED YELLOW FEVER IMMUNIZATION approved clinic site. PLEASE CALL [phone] FOR AN APPOINTMENT IN EITHER [place] OR [place].   If you have questions about the services listed above, we want to answer them! Email [place] at: [email or web address] Home Visiting Services for elderly and the disabled are available to residents of [facility] who are in need of care that compares to the care offered by a nursing home, have needs that can be met by the program, and  have [place] Medicaid. Other short term services are available to residents 18 years and older who are unable to meet requirements for eligibility to receive services from a certified home health agency, spend the majority of time at home, and need care for six months or less.  PLEASE CALL [id number] OR [phone] FOR MORE INFORMATION. Medication Assistance Program serves as a link between pharmaceutical companies and patients to provide low cost or free prescription medications. This servce is available for residents who meet certain income restrictions and have no insurance coverage.  PLEASE CALL [phone] ([place]) OR [phone] ([place]) FOR MORE INFORMATION.  Updated Feb. 21, 2013

## 2014-03-08 NOTE — ED Notes (Signed)
Pt name called without answer 

## 2014-03-08 NOTE — ED Notes (Signed)
Pt given taxi voucher home.  Address verified by phone with pt's grandmother.  Per grandmother, pt's mother is at home to receive her.

## 2014-03-08 NOTE — ED Notes (Signed)
Pt. Mother's number that is listed in contact information called. Female answered stating he did not know anyone by that name.

## 2014-03-13 NOTE — ED Provider Notes (Signed)
Medical screening examination/treatment/procedure(s) were performed by non-physician practitioner and as supervising physician I was immediately available for consultation/collaboration.  Yousuf Ager T Tenae Graziosi, MD 03/13/14 0737 

## 2014-03-24 ENCOUNTER — Emergency Department (EMERGENCY_DEPARTMENT_HOSPITAL)
Admission: EM | Admit: 2014-03-24 | Discharge: 2014-03-25 | Disposition: A | Discharge status: Other Acute Inpatient Hospital | Payer: Medicare Other | Source: Home / Self Care

## 2014-03-24 ENCOUNTER — Encounter (HOSPITAL_COMMUNITY): Payer: Self-pay | Admitting: Emergency Medicine

## 2014-03-24 DIAGNOSIS — F22 Delusional disorders: Secondary | ICD-10-CM

## 2014-03-24 DIAGNOSIS — F259 Schizoaffective disorder, unspecified: Secondary | ICD-10-CM | POA: Diagnosis present

## 2014-03-24 DIAGNOSIS — F29 Unspecified psychosis not due to a substance or known physiological condition: Secondary | ICD-10-CM

## 2014-03-24 DIAGNOSIS — F209 Schizophrenia, unspecified: Secondary | ICD-10-CM

## 2014-03-24 HISTORY — DX: Schizophrenia, unspecified: F20.9

## 2014-03-24 LAB — CBC
HCT: 38.8 % (ref 36.0–46.0)
HEMOGLOBIN: 12.9 g/dL (ref 12.0–15.0)
MCH: 29.7 pg (ref 26.0–34.0)
MCHC: 33.2 g/dL (ref 30.0–36.0)
MCV: 89.2 fL (ref 78.0–100.0)
Platelets: 237 10*3/uL (ref 150–400)
RBC: 4.35 MIL/uL (ref 3.87–5.11)
RDW: 14.4 % (ref 11.5–15.5)
WBC: 7.1 10*3/uL (ref 4.0–10.5)

## 2014-03-24 LAB — URINALYSIS, ROUTINE W REFLEX MICROSCOPIC
BILIRUBIN URINE: NEGATIVE
Glucose, UA: NEGATIVE mg/dL
Hgb urine dipstick: NEGATIVE
KETONES UR: NEGATIVE mg/dL
Leukocytes, UA: NEGATIVE
NITRITE: NEGATIVE
Protein, ur: NEGATIVE mg/dL
Specific Gravity, Urine: 1.027 (ref 1.005–1.030)
Urobilinogen, UA: 0.2 mg/dL (ref 0.0–1.0)
pH: 6 (ref 5.0–8.0)

## 2014-03-24 LAB — WET PREP, GENITAL
Clue Cells Wet Prep HPF POC: NONE SEEN
Trich, Wet Prep: NONE SEEN
WBC, Wet Prep HPF POC: NONE SEEN
YEAST WET PREP: NONE SEEN

## 2014-03-24 LAB — ETHANOL: Alcohol, Ethyl (B): <11 mg/dL (ref 0–11)

## 2014-03-24 LAB — RAPID URINE DRUG SCREEN, HOSP PERFORMED
Amphetamines: NOT DETECTED
BARBITURATES: NOT DETECTED
Benzodiazepines: NOT DETECTED
COCAINE: NOT DETECTED
Opiates: NOT DETECTED
TETRAHYDROCANNABINOL: NOT DETECTED

## 2014-03-24 LAB — COMPREHENSIVE METABOLIC PANEL
ALK PHOS: 60 U/L (ref 39–117)
ALT: 11 U/L (ref 0–35)
AST: 16 U/L (ref 0–37)
Albumin: 4 g/dL (ref 3.5–5.2)
BILIRUBIN TOTAL: 0.3 mg/dL (ref 0.3–1.2)
BUN: 9 mg/dL (ref 6–23)
CO2: 30 meq/L (ref 19–32)
Calcium: 9.5 mg/dL (ref 8.4–10.5)
Chloride: 99 mEq/L (ref 96–112)
Creatinine, Ser: 0.8 mg/dL (ref 0.50–1.10)
GFR calc non Af Amer: >90 mL/min (ref >90)
GLUCOSE: 91 mg/dL (ref 70–99)
POTASSIUM: 4.2 meq/L (ref 3.7–5.3)
Sodium: 139 mEq/L (ref 137–147)
TOTAL PROTEIN: 8.1 g/dL (ref 6.0–8.3)

## 2014-03-24 LAB — POC URINE PREG, ED: Preg Test, Ur: NEGATIVE

## 2014-03-24 LAB — ACETAMINOPHEN LEVEL

## 2014-03-24 LAB — SALICYLATE LEVEL: Salicylate Lvl: <2 mg/dL — ABNORMAL LOW (ref 2.8–20.0)

## 2014-03-24 MED ORDER — BENZTROPINE MESYLATE 1 MG PO TABS
0.5000 mg | ORAL_TABLET | Freq: Two times a day (BID) | ORAL | Status: DC
  Administered 2014-03-24 – 2014-03-25 (×2): 0.5 mg via ORAL
  Filled 2014-03-24 (×2): qty 1

## 2014-03-24 MED ORDER — HALOPERIDOL 2 MG PO TABS
2.0000 mg | ORAL_TABLET | Freq: Every day | ORAL | Status: DC
  Administered 2014-03-24: 2 mg via ORAL
  Filled 2014-03-24: qty 1

## 2014-03-24 NOTE — BH Assessment (Signed)
Tele-assessment completed. Consulted with Alberteen Sam, NP who agrees that patient meets inpatient criteria. Dr. Denton Lank has been notified of recommendations. Pt will need a 400 hall bed. TTS will seek placement at other facilities.

## 2014-03-24 NOTE — ED Notes (Signed)
TTS in progress 
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
## 2014-03-24 NOTE — BH Assessment (Signed)
Assessment Note  Kathy Brown is an 33 y.o. female presenting to [facility] ED reporting that her medication does not work. Pt stated "my medicine is not working and I sleep a lot". Pt also reported "I hear voices and I talk to myself".  Pt is alert and oriented x3. Pt reported that she has suicidal thoughts but denies having a plan at this time. When prompted about thoughts to harm herself and a plan patient stated "a little bit, I don't know". Pt denied HI and VH at this time. Pt is currently endorsing AH and shared that the voices usually tells her to do the dishes or clean her room. Pt is currently endorsing depressive symptoms such as despondent, tearfulness, isolation, and fatigue. Pt reported that she has been sleeping a lot and will stay in bed all day. Pt denied any illicit substance use. Pt has been hospitalized in the past and is currently seeing a psychiatrist and therapist at [facility]. Pt reported that she lives at home with her mother and three children ages 58, 13 and 62. Pt's mother reported that her medication was recently increased; however pt continues to hear voices ad will carry on a full conversation with herself. She aslo reported that pt has been crying more frequently I the past two weeks. She shared that patient has made statements about not wanting to live. She reported that pt has said 'I don't want to live" or "I wish I was dead".  Axis I: Schizophrenia Axis II: Deferred Axis III:  Past Medical History  Diagnosis Date  . Anxiety   . Asthma   . Schizophrenia    Axis IV: other psychosocial or environmental problems Axis V: 21-30 behavior considerably influenced by delusions or hallucinations OR serious impairment in judgment, communication OR inability to function in almost all areas  Past Medical History:  Past Medical History  Diagnosis Date  . Anxiety   . Asthma   . Schizophrenia     History reviewed. No pertinent past surgical history.  Family History: History  reviewed. No pertinent family history.  Social History:  reports that she has never smoked. She does not have any smokeless tobacco history on file. She reports that she does not drink alcohol or use illicit drugs.  Additional Social History:  Alcohol / Drug Use History of alcohol / drug use?: No history of alcohol / drug abuse  CIWA: CIWA-Ar BP: 150/83 mmHg Pulse Rate: 100 COWS:    Allergies: No Known Allergies  Home Medications:  (Not in a hospital admission)  OB/GYN Status:  No LMP recorded.  General Assessment Data Location of Assessment: [facility] ED Is this a Tele or Face-to-Face Assessment?: Face-to-Face Is this an Initial Assessment or a Re-assessment for this encounter?: Initial Assessment Living Arrangements: Children;Parent Can pt return to current living arrangement?: Yes Admission Status: Voluntary Is patient capable of signing voluntary admission?: Yes Transfer from: Acute Hospital Referral Source: Self/Family/Friend     [facility] Crisis Care Plan Living Arrangements: Children;Parent Name of Psychiatrist: Vesta Mixer  Name of Therapist: [facility]   Education Status Is patient currently in school?: No Current Grade: NA Highest grade of school patient has completed: 12 Name of school: NA Contact person: NA  Risk to self Suicidal Ideation: Yes-Currently Present Suicidal Intent: No Is patient at risk for suicide?: No Suicidal Plan?: No Access to Means: No What has been your use of drugs/alcohol within the last 12 months?: None Previous Attempts/Gestures: No How many times?: 0 Other Self Harm Risks: None reported  Triggers for Past Attempts: None known Intentional Self Injurious Behavior: None Family Suicide History: Unable to assess Recent stressful life event(s):  (none reported) Persecutory voices/beliefs?: No Depression: Yes Depression Symptoms: Despondent;Tearfulness;Fatigue Substance abuse history and/or treatment for substance abuse?: No Suicide prevention  information given to non-admitted patients: Not applicable  Risk to Others Homicidal Ideation: No Thoughts of Harm to Others: No Current Homicidal Intent: No Current Homicidal Plan: No Access to Homicidal Means: No Identified Victim: NA History of harm to others?: No Assessment of Violence: None Noted Violent Behavior Description: No violent behavior reported  Does patient have access to weapons?: No Criminal Charges Pending?: No Does patient have a court date: No  Psychosis Hallucinations: Auditory Delusions: None noted  Mental Status Report Appear/Hygiene: Disheveled;In scrubs Eye Contact: Fair Motor Activity: Freedom of movement Speech: Logical/coherent Level of Consciousness: Quiet/awake;Alert Mood: Depressed Affect: Appropriate to circumstance Anxiety Level: Minimal Thought Processes: Relevant;Coherent;Circumstantial Judgement: Partial Orientation: Appropriate for developmental age Obsessive Compulsive Thoughts/Behaviors: None  Cognitive Functioning Concentration: Normal Memory: Recent Intact;Remote Intact IQ: Average Insight: Fair Impulse Control: Good Appetite: Good Weight Loss: 0 Sleep: Increased Total Hours of Sleep: 10 Vegetative Symptoms: Staying in bed  ADLScreening [facility]) Patient's cognitive ability adequate to safely complete daily activities?: Yes Patient able to express need for assistance with ADLs?: Yes Independently performs ADLs?: Yes (appropriate for developmental age)  Prior Inpatient Therapy Prior Inpatient Therapy: Yes Prior Therapy Dates: 2012 [facility] Reason for Treatment: Psychosis  Prior Outpatient Therapy Prior Outpatient Therapy: Yes Prior Therapy Dates: 2012 Prior Therapy Facilty/Provider(s): [facility]  Reason for Treatment: Schizophrenia   ADL Screening (condition at time of admission) Patient's cognitive ability adequate to safely complete daily activities?: Yes Is the  patient deaf or have difficulty hearing?: No Does the patient have difficulty seeing, even when wearing glasses/contacts?: No Does the patient have difficulty concentrating, remembering, or making decisions?: No Patient able to express need for assistance with ADLs?: Yes Does the patient have difficulty dressing or bathing?: No Independently performs ADLs?: Yes (appropriate for developmental age) Does the patient have difficulty walking or climbing stairs?: No       Abuse/Neglect Assessment (Assessment to be complete while patient is alone) Physical Abuse: Denies Verbal Abuse: Denies Sexual Abuse: Denies Exploitation of patient/patient's resources: Denies Self-Neglect: Denies Values / Beliefs Cultural Requests During Hospitalization: None Spiritual Requests During Hospitalization: None        Additional Information 1:1 In Past 12 Months?: No CIRT Risk: No Elopement Risk: No Does patient have medical clearance?: Yes     Disposition: Consulted with Alberteen SamFran Hobson, NP who agrees that patient meets inpatient criteria. Dr. Arizona ConstableStienl has been notified of the recommendation. TTS will contact other facilities for placement.  Disposition Initial Assessment Completed for this Encounter: Yes Disposition of Patient: Inpatient treatment program Type of inpatient treatment program: Adult  On Site Evaluation by:   Reviewed with Physician:    Lahoma RockerLaquesta S Demica Zook 03/24/2014 11:26 PM

## 2014-03-24 NOTE — ED Notes (Signed)
SANE RN, currently involved in a case and will be here after 1900

## 2014-03-24 NOTE — ED Notes (Signed)
Per Kennyth Arnold, charge nurse patient is not moving back to a room due to complaint and SANE RN coming to see patient after 7p.

## 2014-03-25 ENCOUNTER — Inpatient Hospital Stay (HOSPITAL_COMMUNITY)
Admission: AD | Admit: 2014-03-25 | Discharge: 2014-03-28 | DRG: 885 | Disposition: A | Discharge status: Home / Self Care | Payer: Medicare Other | Source: Intra-hospital | Attending: Psychiatry | Admitting: Psychiatry

## 2014-03-25 ENCOUNTER — Encounter (HOSPITAL_COMMUNITY): Payer: Self-pay | Admitting: *Deleted

## 2014-03-25 ENCOUNTER — Encounter (HOSPITAL_COMMUNITY): Payer: Self-pay | Admitting: Psychiatry

## 2014-03-25 DIAGNOSIS — F29 Unspecified psychosis not due to a substance or known physiological condition: Secondary | ICD-10-CM

## 2014-03-25 DIAGNOSIS — F259 Schizoaffective disorder, unspecified: Secondary | ICD-10-CM | POA: Diagnosis present

## 2014-03-25 DIAGNOSIS — J45909 Unspecified asthma, uncomplicated: Secondary | ICD-10-CM | POA: Diagnosis present

## 2014-03-25 DIAGNOSIS — F22 Delusional disorders: Secondary | ICD-10-CM

## 2014-03-25 DIAGNOSIS — Z5987 Material hardship due to limited financial resources, not elsewhere classified: Secondary | ICD-10-CM

## 2014-03-25 DIAGNOSIS — F411 Generalized anxiety disorder: Secondary | ICD-10-CM | POA: Diagnosis present

## 2014-03-25 DIAGNOSIS — Z598 Other problems related to housing and economic circumstances: Secondary | ICD-10-CM

## 2014-03-25 DIAGNOSIS — F329 Major depressive disorder, single episode, unspecified: Secondary | ICD-10-CM | POA: Diagnosis present

## 2014-03-25 HISTORY — DX: Unspecified psychosis not due to a substance or known physiological condition: F29

## 2014-03-25 HISTORY — DX: Schizoaffective disorder, unspecified: F25.9

## 2014-03-25 LAB — GC/CHLAMYDIA PROBE AMP
CT PROBE, AMP APTIMA: NEGATIVE
GC PROBE AMP APTIMA: NEGATIVE

## 2014-03-25 MED ORDER — BENZTROPINE MESYLATE 0.5 MG PO TABS
0.5000 mg | ORAL_TABLET | Freq: Two times a day (BID) | ORAL | Status: DC
  Administered 2014-03-25 – 2014-03-28 (×6): 0.5 mg via ORAL
  Filled 2014-03-25 (×9): qty 1

## 2014-03-25 MED ORDER — ALUM & MAG HYDROXIDE-SIMETH 200-200-20 MG/5ML PO SUSP
30.0000 mL | ORAL | Status: DC | PRN
  Administered 2014-03-27 (×3): 30 mL via ORAL

## 2014-03-25 MED ORDER — LORAZEPAM 0.5 MG PO TABS
0.5000 mg | ORAL_TABLET | Freq: Once | ORAL | Status: AC
  Administered 2014-03-25: 0.5 mg via ORAL
  Filled 2014-03-25: qty 1

## 2014-03-25 MED ORDER — TRAZODONE HCL 100 MG PO TABS
100.0000 mg | ORAL_TABLET | Freq: Every day | ORAL | Status: DC
  Administered 2014-03-25 – 2014-03-27 (×3): 100 mg via ORAL
  Filled 2014-03-25 (×4): qty 1

## 2014-03-25 MED ORDER — ACETAMINOPHEN 325 MG PO TABS: 650.0000 mg | ORAL_TABLET | Freq: Four times a day (QID) | ORAL | Status: DC | PRN

## 2014-03-25 MED ORDER — RISPERIDONE 1 MG PO TABS
1.0000 mg | ORAL_TABLET | Freq: Two times a day (BID) | ORAL | Status: DC
  Administered 2014-03-25 – 2014-03-28 (×6): 1 mg via ORAL
  Filled 2014-03-25 (×9): qty 1

## 2014-03-25 MED ORDER — MAGNESIUM HYDROXIDE 400 MG/5ML PO SUSP: 30.0000 mL | Freq: Every day | ORAL | Status: DC | PRN

## 2014-03-25 NOTE — Progress Notes (Signed)
Adult Psychoeducational Group Note  Date:  03/25/2014 Time:  11:42 PM  Group Topic/Focus:  Wrap-Up Group:   The focus of this group is to help patients review their daily goal of treatment and discuss progress on daily workbooks.  Participation Level:  Active  Participation Quality:  Appropriate  Affect:  Appropriate  Cognitive:  Appropriate  Insight: Appropriate  Engagement in Group:  Engaged  Modes of Intervention:  Discussion  Additional Comments:  The patient expressed she felt  better today since her medication were corrected.  Kathy Brown 03/25/2014, 11:42 PM

## 2014-03-25 NOTE — BH Assessment (Signed)
[facility] currently does not have an appropriate bed available. Contacted the following facilities for placement:  BED AVAILABLE. FAXED CLINICAL INFORMATION: [facility]  AT CAPACITY: [facility]: Per [facility]: Per [facility]: Per [facility]: [facility]: [facility]: [facility]: Per [facility]: Per [facility]: Per Jana [facility]: Per Rene Kocher  NO ANSWER: [facility]   [address] [place], [place], [facility] Triage Specialist [phone]

## 2014-03-25 NOTE — ED Notes (Signed)
Patient was crying in her room when writer asked what was wrong patient stated she was hungry. Patient was given a [place] sandwich and a ginger ale soda.

## 2014-03-25 NOTE — Progress Notes (Signed)
Patient ID: Kathy Brown, female   DOB: 12-26-1980, 33 y.o.   MRN: [id number]  33 year old black female admitted after she presented to [facility] with SI and voices telling her to hurt self. Pt reported that her medication was not working and that the voices were getting worse. Pt reported that she lives at home with her mother and her siblings. Pts mother reported that when patient was at home she was sleeping a lot and that she would be in the bed all day. Pts mother also reported that patient has also been having frequent crying spells and was reporting that she wished she was dead. At time of admission patient reported being positive SI, however was able to contract for safety. Pt reported that she was stilling hearing voices and that sometimes she sees things. Pt reported that she talks back to the voices.

## 2014-03-25 NOTE — ED Notes (Signed)
Was in her room crying. Stated she didn't want to go to a hospital, she wanted to go home. Provided emotional support. Helped her into bed and covers pulled up. She didn't sleep good last night because she was here. Encouraged to try to relax and rest.

## 2014-03-25 NOTE — Tx Team (Signed)
Initial Interdisciplinary Treatment Plan  PATIENT STRENGTHS: (choose at least two) Supportive family/friends  PATIENT STRESSORS: Traumatic event   PROBLEM LIST: Problem List/Patient Goals Date to be addressed Date deferred Reason deferred Estimated date of resolution  Depression 03/25/14                                                      DISCHARGE CRITERIA:  Improved stabilization in mood, thinking, and/or behavior  PRELIMINARY DISCHARGE PLAN: Return to previous living arrangement  PATIENT/FAMIILY INVOLVEMENT: This treatment plan has been presented to and reviewed with the patient, Yudith Coffell, and/or family member.  The patient and family have been given the opportunity to ask questions and make suggestions.  Basel Defalco Shanta Kazuo Durnil 03/25/2014, 1:10 PM

## 2014-03-25 NOTE — ED Notes (Signed)
[facility] here to transport to [facility]. Belongings bag x3 given to driver. Ambulatory without difficulty. Denies pain. No complaints voiced.

## 2014-03-25 NOTE — [facility] Group Notes (Signed)
[facility] Group Notes: (Clinical Social Work)   03/25/2014 1:15-2:15PM   Summary of Progress/Problems: The main focus of today's process group was for the patient to identify ways in which they have sabotaged their own mental health wellness/recovery. Motivational interviewing and a handout were used to explore the benefits and costs of their self-sabotaging behavior as well as the benefits and costs of changing this behavior. The Stages of Change were explained to the group using a handout, and patients identified where they are with regard to changing self-defeating behaviors. The patient expressed she self-sabotages with stopping her medications, said she did not know why. She was in and out of the room multiple times, kept changing chairs.   Type of Therapy: Process Group   Participation Level: Mimimal   Participation Quality: Inattentive   Affect: Depressed and Flat   Cognitive: Disorganized   Insight: Limited   Engagement in Therapy: Limited   Modes of Intervention: Education, Motivational Interviewing   Ambrose Mantle, LCSW  03/25/2014, 4:00pm

## 2014-03-25 NOTE — Discharge Instructions (Signed)
Confusion °Confusion is the inability to think with your usual speed or clarity. Confusion may come on quickly or slowly over time. How quickly the confusion comes on depends on the cause. Confusion can be due to any number of causes. °CAUSES  °· Concussion, head injury, or head trauma. °· Seizures. °· Stroke. °· Fever. °· Senility. °· Heightened emotional states like rage or terror. °· Mental illness in which the person loses the ability to determine what is real and what is not (hallucinations). °· Infections. °· Toxic effects from alcohol, drugs, or prescription medicines. °· Dehydration and an imbalance of salts in the body (electrolytes). °· Lack of sleep. °· Low blood sugar (diabetes). °· Low levels of oxygen (for example from chronic lung disorders). °· Drug interactions or other medication side effects. °· Nutritional deficiencies, especially niacin, thiamine, vitamin C, or vitamin B. °· Sudden drop in body temperature (hypothermia). °· Illness in the elderly. Constipation can result in confusion. An elderly person who is hospitalized may become confused due to change in daily routine. °SYMPTOMS  °People often describe their thinking as cloudy or unclear when they are confused. Confusion can also include feeling disoriented. That means you are unaware of where or who you are. You may also not know what the date or time is. If confused, you may also have difficulty paying attention, remembering and making decisions. Some people also act aggressively when they are confused.  °DIAGNOSIS  °The medical evaluation of confusion may include: °· Blood and urine tests. °· X-rays. °· Brain and nervous system tests. °· Analyzing your brain waves (electroencphalogram or EEG). °· A special X-ray (MRI) of your head or other special studies. °Your physician will ask questions such as: °· Do you get days and nights mixed up? °· Are you awake during regular sleep times? °· Do you have trouble recognizing people? °· Do you  know where you are? °· Do you know the date and time? °· Does the confusion come and go? °· Is the confusion quickly getting worse? °· Has there been a recent illness? °· Has there been a recent head injury? °· Are you diabetic? °· Do you have a lung disorder? °· What medication are you taking? °· Have you taken drugs or alcohol? °TREATMENT  °An admission to the hospital may not be needed, but a confused person should not be left alone. Stay with a family member or friend until the confusion clears. Avoid alcohol, pain relievers or sedative drugs until you have fully recovered. Do not drive until your caregiver says it is okay. °HOME CARE INSTRUCTIONS °What family and friends can do: °· To find out if someone is confused ask him or her their name, age, and the date. If the person is unsure or answers incorrectly, he or she is confused. °· Always introduce yourself, no matter how well the person knows you. °· Often remind the person of his or her location. °· Place a calendar and clock near the confused person. °· Talk about current events and plans for the day. °· Try to keep the environment calm, quiet and peaceful. °· Make sure the patient keeps follow up appointments with their physician. °PREVENTION  °Ways to prevent confusion: °· Avoid alcohol. °· Eat a balanced diet. °· Get enough sleep. °· Do not become isolated. Spend time with other people and make plans for your days. °· Keep careful watch on your blood sugar levels if you are diabetic. °SEEK IMMEDIATE MEDICAL CARE IF:  °· You develop   severe headaches, repeated vomiting, seizures, blackouts or slurred speech. °· There is increasing confusion, weakness, numbness, restlessness or personality changes. °· You develop a loss of balance, have marked dizziness, feel uncoordinated or fall. °· You have delusions, hallucinations or develop severe anxiety. °· Your family members think you need to be rechecked. °Document Released: 11/20/2004 Document Revised:  01/05/2012 Document Reviewed: 07/18/2008 °[employer]® Patient Information ©2014 [employer]. ° °

## 2014-03-25 NOTE — Progress Notes (Signed)
Spoke to mother and coordinated call between mom and Dr. Tenny Craw

## 2014-03-25 NOTE — Progress Notes (Signed)
Adult Psychoeducational Group Note  Date:  03/25/2014 Time:  3:45 PM  Group Topic/Focus:  Recovery Goals:   The focus of this group is to identify appropriate goals for recovery and establish a plan to achieve them.  Participation Level:  Minimal  Participation Quality:  Inattentive  Affect:  Flat  Cognitive:  Confused  Insight: Limited  Engagement in Group:  Limited  Modes of Intervention:  Education  Additional Comments:  Patient was asked what her goals would be when she left to ensure she did not return. Patient stated she was not taking her medication before she came and had been yelling at her mother. When asked for her plan she responded by complaining about her mother and the situation of her three children who are staying with her grandmother. Patient could not offer a plan and only stated she didn't want to yell at her children as much.  Merleen Milliner 03/25/2014, 3:45 PM
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
## 2014-03-26 DIAGNOSIS — F29 Unspecified psychosis not due to a substance or known physiological condition: Secondary | ICD-10-CM

## 2014-03-26 NOTE — Progress Notes (Signed)
Psychoeducational Group Note  Date: 03/26/2014 Time: 0930 Group Topic/Focus:  Gratefulness:  The focus of this group is to help patients identify what two things they are most grateful for in their lives. What helps ground them and to center them on their work to their recovery.  Participation Level: Did not attend Nerida Boivin A   

## 2014-03-26 NOTE — Progress Notes (Signed)
Writer has observed patient up in the dayroom watching tv, laughing and talking with select peers. She attended group and participated. She voiced no complaints and denies si/hi/a/v hallucinations. She voiced no complaints and writer informed her of medications scheduled. Support given and encouraged her to stay on her medications once discharge.

## 2014-03-26 NOTE — Progress Notes (Signed)
Writer observed patient up in the dayroom watching tv with no interaction with peers. Writer spoke with her 1:1 and she asked if we could get her moms cell phone number out of her cell phone in her locker. Writer discovered that the number on her face sheet was actually the patients cell number. She has not spoken to her mother since being here and is in need of clothes. An exception was made and phone number was retrieved from locker by [facility]. Patient spoke with Clinical research associate after using the phone and was able to talk to her mom and children. Patient is compliant with medications and denies si/hi/a/v hallucinations. Safety maintained on unit with 15 min checks.

## 2014-03-26 NOTE — [facility] Suicide Risk Assessment (Signed)
Suicide Risk Assessment  Suicide Risk Assessment Admission Assessment  Nursing information obtained from:  Patient  Demographic factors: ;Low socioeconomic status  Current Mental Status : NA  Loss Factors  not able to function well  Historical Factors : impulsive,  Risk Reduction Factors : Sense of responsibility to family  Total Time spent with patient : 20 minutes  CLINICAL FACTORS:  psychotic Psychiatric Specialty Exam: ?  ?  .   General Appearance: Casual   Eye Contact::  Poor   Speech:  Pressured   Volume:  Increased   Mood:  Depressed   Affect:  Blunt   Thought Process:  Goal Directed   Orientation:  Full (Time, Place, and Person)   Thought Content:  WDL   Suicidal Thoughts:  No   Homicidal Thoughts:  No   Memory:  NA   Judgement:  Poor   Insight:  Shallow   Psychomotor Activity:  Restlessness   Concentration:  Poor   Recall:  Poor   Fund of Knowledge: Poor   Language:  Poor   Akathisia:  No   Handed:  Right   AIMS (if indicated):   Assets:  Desire for Improvement   Sleep:   Musculoskeletal: Strength & Muscle Tone: within normal limits  Gait & Station: normal  Patient leans: Right  COGNITIVE FEATURES THAT CONTRIBUTE TO RISK:  Closed-mindedness  SUICIDE RISK:  Mild: Suicidal ideation of limited frequency, intensity, duration, and specificity. There are no identifiable plans, no associated intent, mild dysphoria and related symptoms, good self-control (both objective and subjective assessment), few other risk factors, and identifiable protective factors, including available and accessible social support.  PLAN OF CARE: ? Continue current meds,  I certify that inpatient services furnished can reasonably be expected to improve the patient's condition.

## 2014-03-26 NOTE — Progress Notes (Signed)
Psychoeducational Group Note  Date:  03/26/2014 Time:  1015  Group Topic/Focus:  Making Healthy Choices:   The focus of this group is to help patients identify negative/unhealthy choices they were using prior to admission and identify positive/healthier coping strategies to replace them upon discharge.  Participation Level:  Active  Participation Quality:  Appropriate  Affect:  Appropriate  Cognitive:  Oriented  Insight:  Improving  Engagement in Group:  Engaged   Additional Comments:  Pt has attended the group and participated.   Shloima Clinch A 03/26/2014  

## 2014-03-26 NOTE — H&P (Signed)
  Pt was seen by me today and I agree with the key elements documented in H&P.  

## 2014-03-26 NOTE — H&P (Signed)
Psychiatric Admission Assessment Adult  Patient Identification:  Kathy Brown Date of Evaluation:  03/26/2014 Chief Complaint:  MDD History of Present Illness::  Long h/o mental health issues.  Admitted from [facility] ED.  Patient lives with her mother and three children.  Experiencing increasing agitation demonstrated with increased yelling at her children and her mother.  Patient is treated at [facility] and has been taking her medications, as prescribed,  "my mother makes me take my meds".   Her mother reports that she is talking to people who are not there, however the patient says she is just "talking to myself". Presently she denies visual or auditory hallucinations.  She calm and cooperative now at the Adult [facility] Attending groups.  Denies [facility]       Elements:  Location:  Adult [place]. Quality:  acute. Severity:  severe. Timing:  constant. Duration:  last two weeks. Context:  "medications are not working" increased agitation . Associated Signs/Synptoms: Depression Symptoms:   (Hypo) Manic Symptoms:   Anxiety Symptoms:  Excessive Worry, She worries that "people say things about me that are not true" ie smoking cigarettes Psychotic Symptoms:  Hallucinations: Auditory (per family).  Patient denies presently  PTSD Symptoms:  Total Time spent with patient: 30 minutes  Psychiatric Specialty Exam: Physical Exam  Constitutional: She is oriented to person, place, and time. She appears well-developed and well-nourished.  HENT:  Head: Normocephalic and atraumatic.  Neck: Normal range of motion. Neck supple.  Respiratory: Effort normal.  Genitourinary:  patient reports her vaginal itching is "getting better"  Musculoskeletal: Normal range of motion.  Neurological: She is alert and oriented to person, place, and time.  Skin: Skin is warm and dry.    ROS  Blood pressure 105/70, pulse 86, temperature 98 F (36.7 C), temperature source Oral, resp. rate 18, height 5' 2"  (1.575 m), weight  92.08 kg (203 lb), SpO2 98.00%.Body mass index is 37.12 kg/(m^2).  General Appearance: Casual  Eye Contact::  Fair  Speech:  Garbled  Volume:  Normal  Mood:  Anxious  Affect:  Congruent  Thought Process:  Loose  Orientation:  Full (Time, Place, and Person)  Thought Content:  Patient denies AH/VH/reported per motehr  Suicidal Thoughts:  No  Homicidal Thoughts:  No  Memory:  Immediate;   Fair Recent;   Fair Remote;   Fair  Judgement:  Poor  Insight:  Lacking  Psychomotor Activity:  Negative  Concentration:  Fair  Recall:  [employer] of Knowledge:Poor  Language: Poor  Akathisia:  Negative  Handed:  Right  AIMS (if indicated):     Assets:  Financial Resources/Insurance Housing Resilience Social Support  Sleep:  Number of Hours: 4    Musculoskeletal: Strength & Muscle Tone: within normal limits Gait & Station: normal Patient leans: N/A  Past Psychiatric History: Diagnosis: psychosis   Hospitalizations: yes "alot of times"  Outpatient Care: yes [facility]  Substance Abuse Care:no  Self-Mutilation:none  Suicidal Attempts:none  Violent Behaviors: none   Past Medical History:   Past Medical History  Diagnosis Date  . Anxiety   . Asthma   . Schizophrenia    None. Allergies:  No Known Allergies PTA Medications: Prescriptions prior to admission  Medication Sig Dispense Refill  . benztropine (COGENTIN) 0.5 MG tablet Take 1 tablet (0.5 mg total) by mouth 2 (two) times daily.  20 tablet  0  . haloperidol (HALDOL) 2 MG tablet Take 1 tablet (2 mg total) by mouth at bedtime.  10 tablet  0  Previous Psychotropic Medications:  Medication/Dose    See MAR             Substance Abuse History in the last 12 months:  no  Consequences of Substance Abuse: NA  Social History:  reports that she has never smoked. She does not have any smokeless tobacco history on file. She reports that she does not drink alcohol or use illicit drugs. Additional Social History: Pain  Medications: none noted Prescriptions: none noted Over the Counter: none noted History of alcohol / drug use?: No history of alcohol / drug abuse                   Current Place of Residence:  [place] of Birth: [place]  Family Members: Lives with mother Marital Status:  Single Children:  Sons: one age 290  Daughters: two age 29/ 20 Relationships: Education:  HS Graduate Educational Problems/Performance: Religious Beliefs/Practices: History of Abuse (Emotional/Phsycial/Sexual)-denies [employer] History:  None. Legal History: Hobbies/Interests:  Family History:  History reviewed. No pertinent family history.  Results for orders placed during the hospital encounter of 03/24/14 (from the past 72 hour(s))  URINE RAPID DRUG SCREEN (HOSP PERFORMED)     Status: None   Collection Time    03/24/14  5:34 PM      Result Value Ref Range   Opiates NONE DETECTED  NONE DETECTED   Cocaine NONE DETECTED  NONE DETECTED   Benzodiazepines NONE DETECTED  NONE DETECTED   Amphetamines NONE DETECTED  NONE DETECTED   Tetrahydrocannabinol NONE DETECTED  NONE DETECTED   Barbiturates NONE DETECTED  NONE DETECTED   Comment:            DRUG SCREEN FOR MEDICAL PURPOSES     ONLY.  IF CONFIRMATION IS NEEDED     FOR ANY PURPOSE, NOTIFY LAB     WITHIN 5 DAYS.                LOWEST DETECTABLE LIMITS     FOR URINE DRUG SCREEN     Drug Class       Cutoff (ng/mL)     Amphetamine      1000     Barbiturate      200     Benzodiazepine   425     Tricyclics       956     Opiates          300     Cocaine          300     THC              50  URINALYSIS, ROUTINE W REFLEX MICROSCOPIC     Status: Abnormal   Collection Time    03/24/14  5:36 PM      Result Value Ref Range   Color, Urine YELLOW  YELLOW   APPearance CLOUDY (*) CLEAR   Specific Gravity, Urine 1.027  1.005 - 1.030   pH 6.0  5.0 - 8.0   Glucose, UA NEGATIVE  NEGATIVE mg/dL   Hgb urine dipstick NEGATIVE   NEGATIVE   Bilirubin Urine NEGATIVE  NEGATIVE   Ketones, ur NEGATIVE  NEGATIVE mg/dL   Protein, ur NEGATIVE  NEGATIVE mg/dL   Urobilinogen, UA 0.2  0.0 - 1.0 mg/dL   Nitrite NEGATIVE  NEGATIVE   Leukocytes, UA NEGATIVE  NEGATIVE   Comment: MICROSCOPIC NOT DONE ON URINES WITH NEGATIVE PROTEIN, BLOOD, LEUKOCYTES, NITRITE, OR GLUCOSE <1000 mg/dL.  POC URINE PREG, ED     Status: None   Collection Time    03/24/14  5:44 PM      Result Value Ref Range   Preg Test, Ur NEGATIVE  NEGATIVE   Comment:            THE SENSITIVITY OF THIS     METHODOLOGY IS >24 mIU/mL  ACETAMINOPHEN LEVEL     Status: None   Collection Time    03/24/14  5:55 PM      Result Value Ref Range   Acetaminophen (Tylenol), Serum <15.0  10 - 30 ug/mL   Comment:            THERAPEUTIC CONCENTRATIONS VARY     SIGNIFICANTLY. A RANGE OF 10-30     ug/mL MAY BE AN EFFECTIVE     CONCENTRATION FOR MANY PATIENTS.     HOWEVER, SOME ARE BEST TREATED     AT CONCENTRATIONS OUTSIDE THIS     RANGE.     ACETAMINOPHEN CONCENTRATIONS     >150 ug/mL AT 4 HOURS AFTER     INGESTION AND >50 ug/mL AT 12     HOURS AFTER INGESTION ARE     OFTEN ASSOCIATED WITH TOXIC     REACTIONS.  CBC     Status: None   Collection Time    03/24/14  5:55 PM      Result Value Ref Range   WBC 7.1  4.0 - 10.5 K/uL   RBC 4.35  3.87 - 5.11 MIL/uL   Hemoglobin 12.9  12.0 - 15.0 g/dL   HCT 38.8  36.0 - 46.0 %   MCV 89.2  78.0 - 100.0 fL   MCH 29.7  26.0 - 34.0 pg   MCHC 33.2  30.0 - 36.0 g/dL   RDW 14.4  11.5 - 15.5 %   Platelets 237  150 - 400 K/uL  COMPREHENSIVE METABOLIC PANEL     Status: None   Collection Time    03/24/14  5:55 PM      Result Value Ref Range   Sodium 139  137 - 147 mEq/L   Potassium 4.2  3.7 - 5.3 mEq/L   Chloride 99  96 - 112 mEq/L   CO2 30  19 - 32 mEq/L   Glucose, Bld 91  70 - 99 mg/dL   BUN 9  6 - 23 mg/dL   Creatinine, Ser 0.80  0.50 - 1.10 mg/dL   Calcium 9.5  8.4 - 10.5 mg/dL   Total Protein 8.1  6.0 - 8.3 g/dL    Albumin 4.0  3.5 - 5.2 g/dL   AST 16  0 - 37 U/L   ALT 11  0 - 35 U/L   Alkaline Phosphatase 60  39 - 117 U/L   Total Bilirubin 0.3  0.3 - 1.2 mg/dL   GFR calc non Af Amer >90  >90 mL/min   GFR calc Af Amer >90  >90 mL/min   Comment: (NOTE)     The eGFR has been calculated using the CKD EPI equation.     This calculation has not been validated in all clinical situations.     eGFR's persistently <90 mL/min signify possible Chronic Kidney     Disease.  ETHANOL     Status: None   Collection Time    03/24/14  5:55 PM      Result Value Ref Range   Alcohol, Ethyl (B) <11  0 - 11 mg/dL   Comment:  LOWEST DETECTABLE LIMIT FOR     SERUM ALCOHOL IS 11 mg/dL     FOR MEDICAL PURPOSES ONLY  SALICYLATE LEVEL     Status: Abnormal   Collection Time    03/24/14  5:55 PM      Result Value Ref Range   Salicylate Lvl <5.2 (*) 2.8 - 20.0 mg/dL  WET PREP, GENITAL     Status: None   Collection Time    03/24/14  9:28 PM      Result Value Ref Range   Yeast Wet Prep HPF POC NONE SEEN  NONE SEEN   Trich, Wet Prep NONE SEEN  NONE SEEN   Clue Cells Wet Prep HPF POC NONE SEEN  NONE SEEN   WBC, Wet Prep HPF POC NONE SEEN  NONE SEEN  GC/CHLAMYDIA PROBE AMP     Status: None   Collection Time    03/24/14  9:28 PM      Result Value Ref Range   CT Probe RNA NEGATIVE  NEGATIVE   GC Probe RNA NEGATIVE  NEGATIVE   Comment: (NOTE)                                                                                               **Normal Reference Range: Negative**          Assay performed using the Gen-Probe APTIMA COMBO2 (R) Assay.     Acceptable specimen types for this assay include APTIMA Swabs (Unisex,     endocervical, urethral, or vaginal), first void urine, and ThinPrep     liquid based cytology samples.     Performed at [facility]   Psychological Evaluations:  Assessment:   DSM5:  Schizophrenia Disorders:  Schizophrenia (295.7) Obsessive-Compulsive Disorders:  Trauma-Stressor  Disorders:   Substance/Addictive Disorders:   Depressive Disorders:   AXIS I:  Psychotic Disorder NOS AXIS II:  Questionable IQ level  AXIS III:   Past Medical History  Diagnosis Date  . Anxiety   . Asthma   . Schizophrenia    AXIS IV:  other psychosocial or environmental problems, problems related to social environment and problems with primary support group AXIS V:  31-40 impairment in reality testing  Treatment Plan/Recommendations:     Review of chart, vital signs, medications and notes Daily contact with the patient to assess and evaluate synmptoms and progress in treatment  1. Continue crisis management and stabilization. Estimated length of stay 5-7 days 2.  Medication management to reduce current symptoms to base line and improve patient's overall level of functioning     Medications reviewed with the pateint and no untoward effects      -Risperdal 1 mg bid      -Cogentin 0.5 mg bid Individual and group therapy encouraged Coping skills/Parentling skills Inclusion of family and support persons for successful maintenance  3.  Treat health problems as indicated. 4.  Develop treatment plan to decrease risk of relapse upon discharge and the need for readmission 5.  Psych-social education regarding relapse prevention and self care. 6.  Health care follow up as needed for medical problems 7.  Continue home  medications where appropriate 8.  Disposition in progress   Treatment Plan Summary: Daily contact with patient to assess and evaluate symptoms and progress in treatment Medication management Current Medications:  Current Facility-Administered Medications  Medication Dose Route Frequency Provider Last Rate Last Dose  . acetaminophen (TYLENOL) tablet 650 mg  650 mg Oral Q6H PRN Waylan Boga, NP      . alum & mag hydroxide-simeth (MAALOX/MYLANTA) 200-200-20 MG/5ML suspension 30 mL  30 mL Oral Q4H PRN Waylan Boga, NP      . benztropine (COGENTIN) tablet 0.5 mg  0.5 mg Oral  BID Waylan Boga, NP   0.5 mg at 03/26/14 0906  . magnesium hydroxide (MILK OF MAGNESIA) suspension 30 mL  30 mL Oral Daily PRN Waylan Boga, NP      . risperiDONE (RISPERDAL) tablet 1 mg  1 mg Oral BID Waylan Boga, NP   1 mg at 03/26/14 0906  . traZODone (DESYREL) tablet 100 mg  100 mg Oral QHS Waylan Boga, NP   100 mg at 03/25/14 2130    Observation Level/Precautions:  15 minute checks  Laboratory: reviewed from ED  Psychotherapy:  groups  Medications:  See [facility]   Consultations:  As needed  Discharge Concerns:  Social support and maintanence    Estimated LOS: 5-7 days  Other:     I certify that inpatient services furnished can reasonably be expected to improve the patient's condition.   [place] 5/31/201510:52 AM

## 2014-03-26 NOTE — Progress Notes (Signed)
Adult Psychoeducational Group Note  Date:  03/26/2014 Time:  10:59 PM  Group Topic/Focus:  Wrap-Up Group:   The focus of this group is to help patients review their daily goal of treatment and discuss progress on daily workbooks.  Participation Level:  Active  Participation Quality:  Appropriate  Affect:  Appropriate  Cognitive:  Appropriate  Insight: Appropriate  Engagement in Group:  Engaged  Modes of Intervention:  Discussion  Additional Comments: The  Patient expressed that she did not pay attention in group.  Laneta Simmers Mahnoor Mathisen 03/26/2014, 10:59 PM

## 2014-03-26 NOTE — [facility] Group Notes (Signed)
[facility] Group Notes:  (Clinical Social Work)  03/26/2014   1:15-2:15PM  Summary of Progress/Problems: The main focus of today's process group was to decide on additional supports that can be put in place to help patients remain mentally healthy and in the community. There was an extensive discussion about what constitutes a healthy support versus an unhealthy support. An emphasis was placed on using counselor, doctor, therapy groups, 12-step groups, and problem-specific support groups to expand supports. The patient expressed that she does not want to add anything, but she had been in and out of the room numerous times, and did not really participate or listen.   Type of Therapy:  Process Group  Participation Level:  Limited  Participation Quality:  Inattentive and Distracting  Affect:  Blunted  Cognitive: Alert  Insight:  Limited  Engagement in Therapy:  Limited  Modes of Intervention:  Education,  Support and [employer], LCSW 03/26/2014, 4:00pm

## 2014-03-26 NOTE — Progress Notes (Signed)
D Zariana is seen out in the milieu,. She is flat and slow. SHe speaks very  sloowly and deliberately. She walks with her pajama pants waist band underneath her umbilicus such that her abdomen is seen protruding out from her shirt.   A She takes her medications as ordered. She completes her morning inventory and on it she rates her depression and hopelessness " no / no " and says she denies SI within the past 24 hrs. When asked about her DC plans she just stares at this nurse and says nothing.   R Pt cont  Status quop. Questionalble thought blocking with MR with slow processing and paranoia.

## 2014-03-27 MED ORDER — FAMOTIDINE 20 MG PO TABS
20.0000 mg | ORAL_TABLET | Freq: Two times a day (BID) | ORAL | Status: DC
  Administered 2014-03-27 – 2014-03-28 (×2): 20 mg via ORAL
  Filled 2014-03-27 (×6): qty 1

## 2014-03-27 NOTE — [facility] Group Notes (Signed)
[facility] LCSW Aftercare Discharge Planning Group Note   03/27/2014 8:45 AM  Participation Quality:  Alert, Appropriate and Oriented  Mood/Affect:  Calm  Depression Rating:  "a little"  Anxiety Rating:  "a little"  Thoughts of Suicide:  Pt denies SI/HI  Will you contract for safety?   Yes  Current AVH:  Pt denies  Plan for Discharge/Comments:  Pt attended discharge planning group and actively participated in group.  CSW provided pt with today's workbook.  Pt reports feeling well and is hoping to d/c soon.  Pt will return home with family support in [place] and has follow up scheduled at [facility] for outpatient medication management and therapy.  No further needs voiced by pt at this time.    Transportation Means: Pt reports access to transportation - family will pick pt up  Supports: Family is supportive  Reyes Ivan, LCSW 03/27/2014 10:02 AM

## 2014-03-27 NOTE — Progress Notes (Signed)
Adult Psychoeducational Group Note  Date:  03/27/2014 Time:  08:00pm  Group Topic/Focus:  Wrap-Up Group:   The focus of this group is to help patients review their daily goal of treatment and discuss progress on daily workbooks.  Participation Level:  Did Not Attend  Participation Quality:    Affect:   Cognitive:    Insight:   Engagement in Group:    Modes of Intervention:    Additional Comments:  Pt did not attend group.  Pryor Curia 03/27/2014, 9:19 PM

## 2014-03-27 NOTE — [facility] Group Notes (Signed)
[facility] LCSW Group Therapy  03/27/2014   1:15 PM   Type of Therapy:  Group Therapy  Participation Level:  Active  Participation Quality:  Attentive, Sharing and Supportive  Affect:  Calm  Cognitive:  Alert and Oriented  Insight:  Developing/Improving and Engaged  Engagement in Therapy:  Developing/Improving and Engaged  Modes of Intervention:  Clarification, Confrontation, Discussion, Education, Exploration, Limit-setting, Orientation, Problem-solving, Rapport Building, Dance movement psychotherapist, Socialization and Support  Summary of Progress/Problems: Pt identified obstacles faced currently and processed barriers involved in overcoming these obstacles. Pt identified steps necessary for overcoming these obstacles and explored motivation (internal and external) for facing these difficulties head on. Pt further identified one area of concern in their lives and chose a goal to focus on for today. Pt shared that her biggest obstacle was her medication not working, and was able to identify the behaviors that were problematic in the home.  Pt actively participated and was engaged in group discussion.    Kathy Ivan, LCSW 03/27/2014  2:50 PM

## 2014-03-27 NOTE — Tx Team (Signed)
Interdisciplinary Treatment Plan Update (Adult)  Date: 03/27/2014  Time Reviewed:  9:45 AM  Progress in Treatment: Attending groups: Yes Participating in groups:  Yes Taking medication as prescribed:  Yes Tolerating medication:  Yes Family/Significant othe contact made: CSW assessing  Patient understands diagnosis:  Yes Discussing patient identified problems/goals with staff:  Yes Medical problems stabilized or resolved:  Yes Denies suicidal/homicidal ideation: Yes Issues/concerns per patient self-inventory:  Yes Other:  New problem(s) identified: N/A  Discharge Plan or Barriers: CSW assessing for appropriate referrals.  Reason for Continuation of Hospitalization: Anxiety Depression Medication Stabilization  Comments: N/A  Estimated length of stay: 1-2 days  For review of initial/current patient goals, please see plan of care.  Attendees: Patient:   Family:     Physician:  Dr. Javier Glazier 03/27/2014 10:18 AM   Nursing:   Nestor Ramp, RN 03/27/2014 10:18 AM   Clinical Social Worker:  Reyes Ivan, LCSW 03/27/2014 10:18 AM   Other: Verne Spurr, PA 03/27/2014 10:18 AM   Other:  Sherrye Payor, care coordination 03/27/2014 10:18 AM   Other:  Juline Patch, LCSW 03/27/2014 10:18 AM   Other:  Quintella Reichert, RN 03/27/2014 10:22 AM   Other: Onnie Boer, UR case manager 03/27/2014 10:22 AM   Other:    Other:    Other:    Other:      Scribe for Treatment Team:   Carmina Miller, 03/27/2014 , 10:18 AM

## 2014-03-27 NOTE — [facility] Suicide Risk Assessment (Signed)
[facility] INPATIENT:  Family/Significant Other Suicide Prevention Education  Suicide Prevention Education:  Education Completed; Delton Coombes - mother [phone]),  (name of family member/significant other) has been identified by the patient as the family member/significant other with whom the patient will be residing, and identified as the person(s) who will aid the patient in the event of a mental health crisis (suicidal ideations/suicide attempt).  With written consent from the patient, the family member/significant other has been provided the following suicide prevention education, prior to the and/or following the discharge of the patient.  The suicide prevention education provided includes the following:  Suicide risk factors  Suicide prevention and interventions  National Suicide Hotline telephone number  [facility] assessment telephone number  [facility] Emergency Assistance 911  [facility] and/or Residential Mobile Crisis Unit telephone number  Request made of family/significant other to:  Remove weapons (e.g., guns, rifles, knives), all items previously/currently identified as safety concern.    Remove drugs/medications (over-the-counter, prescriptions, illicit drugs), all items previously/currently identified as a safety concern.  The family member/significant other verbalizes understanding of the suicide prevention education information provided.  The family member/significant other agrees to remove the items of safety concern listed above. Mother states that pt was crying and paranoid and said medications weren't working, prior to admission.  Mother is concerned pt is not stable this quickly and worried how her behaviors are affecting her kids in the home.    Libbey Duce N Horton 03/27/2014, 11:05 AM

## 2014-03-27 NOTE — Progress Notes (Signed)
Patient ID: Kathy Brown, female   DOB: 01-20-1981, 33 y.o.   MRN: [id number] PER STATE REGULATIONS 482.30  THIS CHART WAS REVIEWED FOR MEDICAL NECESSITY WITH RESPECT TO THE PATIENT'S ADMISSION/ DURATION OF STAY.  NEXT REVIEW DATE: 03/29/2014  Willa Rough, RN, BSN CASE MANAGER

## 2014-03-27 NOTE — Progress Notes (Signed)
Patient ID: Kathy Brown, female   DOB: [birth year], 33 y.o.   MRN: [id number] D: Pt. Seen eating in dayroom after expressing indigestion, c/o pains of still having heartburn. A: Writer encouraged client to elevate head of bed slightly when lying down to help prevent reflux. Staff with Jilda Roche PA, note client asking for Mylanta with each meal, received new order for Prevacid (see MAR). R: Reviewed medication with client, administered as ordered.

## 2014-03-27 NOTE — Progress Notes (Signed)
Patient ID: Kathy Brown, female   DOB: 08/18/1981, 33 y.o.   MRN: [id number]  D: Patient pleasant on approach this am. Reports that she feels that her medications are working for her now. Reports she feels ready to go home. Hopeful that her mother will bring her some clothes from home. States tired of wearing the same clothing. Gives depression and hopelessness "1". Denies any issues with these at present. Currently denies any SI at present.  A: Staff will monitor on q 15 minute checks, follow treatment plan, and give meds as ordered. R: Cooperative on the unit.

## 2014-03-27 NOTE — Progress Notes (Signed)
Pt attended spiritual care group on grief and loss facilitated by chaplain Burnis Kingfisher.  Group opened with brief discussion and psycho-social ed around grief and loss in relationships and in relation to self. Group identified life patterns, circumstances, changes that cause losses. Established group norm of speaking from own life experience. Group goal of establishing open and affirming space for members to share loss and experience with grief, normalize grief experience and provide psycho social education and grief support, identify ways illness intersects with grief.    Mersadies was present throughout group.  She fell asleep shortly after group began and was snoring.  Group leader woke he and asked her to attempt to stay awake during group or to leave dayroom, as her snoring was disrupting to others.  She stayed awake through remainder of group, but did not engage with other members.   Clover Mealy MDiv
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
## 2014-03-27 NOTE — BHH Counselor (Signed)
Adult Comprehensive Assessment  Patient ID: Janean SarkLatasha Zwick, female   DOB: 1981-05-29, 33 y.o.   MRN: [id number]  Information Source: Information source: Patient  Current Stressors:  Educational / Learning stressors: Pt appears limited Employment / Job issues: On disability Family Relationships: family conflict Financial / Lack of resources (include bankruptcy): on fixed income Housing / Lack of housing: conflict in the home, CPS involved with children  Living/Environment/Situation:  Living Arrangements: Children;Parent Living conditions (as described by patient or guardian): [place]  Pt reports this is a good environment.   How long has patient lived in current situation?: 3 years What is atmosphere in current home: Supportive;Loving;Comfortable  Family History:  Marital status: Single Does patient have children?: Yes How many children?: 3 How is patient's relationship with their children?: Pt reports having a good relationship with her 3 children.  Pt reports CPS is involved.    Childhood History:  By whom was/is the patient raised?: Mother Additional childhood history information: Pt describes her childhood as a good.   Description of patient's relationship with caregiver when they were a child: Pt reports getting along okay with mother growing up.  Patient's description of current relationship with people who raised him/her: Pt reports still getting along well with mother.   Does patient have siblings?: Yes Number of Siblings: 3 Description of patient's current relationship with siblings: 1 brother is deceased, distant relationship with siblings Did patient suffer any verbal/emotional/physical/sexual abuse as a child?: No Did patient suffer from severe childhood neglect?: No Has patient ever been sexually abused/assaulted/raped as an adolescent or adult?: No Was the patient ever a victim of a crime or a disaster?: No Witnessed domestic  violence?: No Has patient been effected by domestic violence as an adult?: No  Education:  Highest grade of school patient has completed: graduated high school Currently a student?: No Name of school: N/A Learning disability?: Yes What learning problems does patient have?: pt states that she was in special education classes  Employment/Work Situation:   Employment situation: On disability Why is patient on disability: pt is not sure How long has patient been on disability: pt states a long time Patient's job has been impacted by current illness: No What is the longest time patient has a held a job?: pt reports never working before Where was the patient employed at that time?: N/A Has patient ever been in the [employer] and Companymilitary?: No Has patient ever served in Buyer, retailcombat?: No  Financial Resources:   Surveyor, quantityinancial resources: Miranteceives SSDI;Medicaid;Medicare;Food stamps Does patient have a representative payee or guardian?: Yes Name of representative payee or guardian: Mother is payee  Alcohol/Substance Abuse:   What has been your use of drugs/alcohol within the last 12 months?: Pt denies alcohol and drug abuse If attempted suicide, did drugs/alcohol play a role in this?: No Alcohol/Substance Abuse Treatment Hx: Denies past history If yes, describe treatment: N/A Has alcohol/substance abuse ever caused legal problems?: No  Social Support System:   Conservation officer, natureatient's Community Support System: Fair Describe Community Support System: Pt reports mother and grandmother are supportive Type of faith/religion: Ephriam KnucklesChristian How does patient's faith help to cope with current illness?: prayer  Leisure/Recreation:   Leisure and Hobbies: swimming, trampoline, travel  Strengths/Needs:   What things does the patient do well?: "everything" In what areas does patient struggle / problems for patient: Depression, anxiety, SI  Discharge Plan:   Does patient have access to transportation?: Yes Will patient be returning to  same living  situation after discharge?: Yes Currently receiving community mental health services: Yes (From Whom) Vesta Mixer for medication management and therapy) If no, would patient like referral for services when discharged?: Yes (What county?) [facility]) Does patient have financial barriers related to discharge medications?: No  Summary/Recommendations:     Patient is a 33 year old African American female with a diagnosis of Schizophrenia.  Patient lives in [place] with her family.  Pt reports coming to the hospital because her meds were not working.  Pt is a poor historian and appears limited.  Patient will benefit from crisis stabilization, medication evaluation, group therapy and psycho education in addition to case management for discharge planning.    Kherington Meraz N Horton. 03/27/2014

## 2014-03-27 NOTE — Progress Notes (Signed)
Patient ID: Kathy Brown, female   DOB: Feb 18, 1981, 33 y.o.   MRN: [id number] D: pt. In bed, reports medications given earlier made her sleepy, also reports indigestion. A: Writer introduced self to client, provided emotional support administered Mylanta for indigestion. Encouraged group. Staff will monitor q62min for safety. R: Pt. Is safe on the unit, did not attend group.

## 2014-03-28 DIAGNOSIS — F209 Schizophrenia, unspecified: Secondary | ICD-10-CM

## 2014-03-28 MED ORDER — TRAZODONE HCL 100 MG PO TABS: 100.0000 mg | ORAL_TABLET | Freq: Every day | ORAL | Status: DC

## 2014-03-28 MED ORDER — RISPERIDONE 1 MG PO TABS: 1.0000 mg | ORAL_TABLET | Freq: Two times a day (BID) | ORAL | Status: DC

## 2014-03-28 MED ORDER — BENZTROPINE MESYLATE 0.5 MG PO TABS: 0.5000 mg | ORAL_TABLET | Freq: Two times a day (BID) | ORAL | Status: DC

## 2014-03-28 NOTE — Discharge Summary (Signed)
Physician Discharge Summary Note  Patient:  Kathy Brown is an 33 y.o., female MRN:  [id number] DOB:  27-Oct-1981 Patient phone:  [phone] (home)  Patient address:   [address]  [place] [address],  Total Time spent with patient: 30 minutes  Date of Admission:  03/25/2014 Date of Discharge: 03/28/2014  Reason for Admission:  Psychosis  Discharge Diagnoses: Active Problems:   Psychosis   Psychiatric Specialty Exam:   Please see D/C SRA Physical Exam  ROS  Blood pressure 102/71, pulse 109, temperature 97 F (36.1 C), temperature source Oral, resp. rate 16, height 5\' 2"  (1.575 m), weight 92.08 kg (203 lb), SpO2 98.00%.Body mass index is 37.12 kg/(m^2).   Past Psychiatric History:  Diagnosis: psychosis   Hospitalizations: yes "alot of times"   Outpatient Care: yes [facility]   Substance Abuse Care:no   Self-Mutilation:none   Suicidal Attempts:none   Violent Behaviors: none    Discharge Diagnoses:  AXIS I: Schizophrenia  AXIS II: Deferred  AXIS III:  Past Medical History   Diagnosis  Date   .  Anxiety    .  Asthma    .  Schizophrenia     AXIS IV: economic problems and educational problems  AXIS V: 61-70 mild symptoms at this time symptoms improved/mild  Level of Care:  OP  Hospital Course:       Aidan Caloca is a 33 year old SF who presented to the [facility] with her mother who reported that "her medications are no longer working for her."        Emogene was a poor historian, but the mother noted that Shilee is yelling at her 3 children more lately and is staying in bed a lot, sleeping all the time. Her mother states that she is also talking to people who aren't there. Despite being seen recently at [facility], very few medication changes were made and Aubriegh seemed to be getting worse. She was accepted to [facility] for further stabilization and crisis management.        Chauna  was admitted to the adult unit where she was evaluated and her symptoms were identified.  Medication management was discussed and implemented. She was encouraged to participate in unit programming. Medical problems were identified and treated appropriately. Home medication was restarted as needed.                She was evaluated each day by a clinical provider to ascertain the patient's response to treatment.  Improvement was noted by the patient's report of decreasing symptoms, improved sleep and appetite, affect, medication tolerance, behavior, and participation in unit programming.  The patient was asked each day to complete a self inventory noting mood, mental status, pain, new symptoms, anxiety and concerns.         She responded well to medication and being in a therapeutic and supportive environment. Positive and appropriate behavior was noted and the patient was motivated for recovery.           By the day of discharge she was in much improved condition than upon admission.  Symptoms were reported as significantly decreased or resolved completely. The patient denied SI/HI and voiced no AVH. She was motivated to continue taking medication with a goal of continued improvement in mental health.          Jessi Pitstick was discharged home with a plan to follow up as noted below.  Consults:  None and psychiatry  Significant Diagnostic Studies:  None  Discharge  Vitals:   Blood pressure 102/71, pulse 109, temperature 97 F (36.1 C), temperature source Oral, resp. rate 16, height 5\' 2"  (1.575 m), weight 92.08 kg (203 lb), SpO2 98.00%. Body mass index is 37.12 kg/(m^2). Lab Results:   No results found for this or any previous visit (from the past 72 hour(s)).  Physical Findings: AIMS: Facial and Oral Movements Muscles of Facial Expression: None, normal Lips and Perioral Area: None, normal Jaw: None, normal Tongue: None, normal,Extremity Movements Upper (arms, wrists, hands, fingers): None, normal Lower (legs, knees, ankles, toes): None, normal, Trunk Movements Neck, shoulders,  hips: None, normal, Overall Severity Severity of abnormal movements (highest score from questions above): None, normal Incapacitation due to abnormal movements: None, normal Patient's awareness of abnormal movements (rate only patient's report): No Awareness, Dental Status Current problems with teeth and/or dentures?: No Does patient usually wear dentures?: No  CIWA:    COWS:     Psychiatric Specialty Exam: See Psychiatric Specialty Exam and Suicide Risk Assessment completed by Attending Physician prior to discharge.  Discharge destination:  Home  Is patient on multiple antipsychotic therapies at discharge:  No   Has Patient had three or more failed trials of antipsychotic monotherapy by history:  No  Recommended Plan for Multiple Antipsychotic Therapies: NA  Discharge Instructions   Diet - low sodium heart healthy    Complete by:  As directed      Discharge instructions    Complete by:  As directed   Take all of your medications as directed. Be sure to keep all of your follow up appointments.  If you are unable to keep your follow up appointment, call your Doctor's office to let them know, and reschedule.  Make sure that you have enough medication to last until your appointment. Be sure to get plenty of rest. Going to bed at the same time each night will help. Try to avoid sleeping during the day.  Increase your activity as tolerated. Regular exercise will help you to sleep better and improve your mental health. Eating a heart healthy diet is recommended. Try to avoid salty or fried foods. Be sure to avoid all alcohol and illegal drugs.     Increase activity slowly    Complete by:  As directed             Medication List    STOP taking these medications       haloperidol 2 MG tablet  Commonly known as:  HALDOL      TAKE these medications     Indication   benztropine 0.5 MG tablet  Commonly known as:  COGENTIN  Take 1 tablet (0.5 mg total) by mouth 2 (two) times daily.    Indication:  Extrapyramidal Reaction caused by Medications     risperiDONE 1 MG tablet  Commonly known as:  RISPERDAL  Take 1 tablet (1 mg total) by mouth 2 (two) times daily.   Indication:  Easily Angered or Annoyed, schizoaffective disorder     traZODone 100 MG tablet  Commonly known as:  DESYREL  Take 1 tablet (100 mg total) by mouth at bedtime.   Indication:  Trouble Sleeping           Follow-up Information   Follow up with [facility] On 04/11/2014. (Appointment scheduled at 9:00 am with Lindley Magnus for medication management)    Contact information:   [address], [place] [address] Phone: [phone] Fax: [phone]      Follow-up recommendations:  Activities: Resume activity as tolerated. Diet: Heart healthy low sodium diet Tests: Follow up testing will be determined by your out patient provider.  Comments:    Total Discharge Time:  Greater than 30 minutes.  Signed: Rona RavensNeil T. Lamekia Nolden RPAC 8:39 PM 03/29/2014

## 2014-03-28 NOTE — BHH Suicide Risk Assessment (Signed)
   Demographic Factors:  Low socioeconomic status and Unemployed  Total Time spent with patient: 30 minutes  I met with patient and also discussed case with treatment team and nursing staff- she is doing better, symptoms have significantly improved, and she is currently denying any SI or HI, and there are no current/active psychotic symptoms. She is tolerating Risperidone well. She will follow up at [facility] and has a scheduled appt she plans to keep.   Psychiatric Specialty Exam: Physical Exam  ROS  Blood pressure 102/71, pulse 109, temperature 97 F (36.1 C), temperature source Oral, resp. rate 16, height 5' 2" (1.575 m), weight 92.08 kg (203 lb), SpO2 98.00%.Body mass index is 37.12 kg/(m^2).  General Appearance: improved grooming   Eye Contact::  Good  Speech:  Normal Rate  Volume:  Normal  Mood:  Euthymic  Affect:  Appropriate  Thought Process:  Linear  Orientation:  Other:  fully alert and attentive- oriented  Thought Content:  Negative and at this time denies any hallucinations and no delusions expressed  Suicidal Thoughts:  No  Homicidal Thoughts:  No  Memory:  Negative  Judgement:  Fair  Insight:  Fair  Psychomotor Activity:  Normal  Concentration:  Good  Recall:  Good  Fund of Knowledge:Good  Language: Good  Akathisia:  No  Handed:  Right  AIMS (if indicated):     Assets:  Desire for Improvement Housing Social Support  Sleep:  Number of Hours: 5.75    Musculoskeletal: Strength & Muscle Tone: within normal limits Gait & Station: normal Patient leans: N/A   Mental Status Per Nursing Assessment::   On Admission:  Suicidal ideation indicated by patient  Current Mental Status by Physician: see above MSE- as noted at this time patient NOT suicidal, denying any thoughts of hurting self or anyone else  Loss Factors: chronic mental illness, disability  Historical Factors: Impulsivity  Risk Reduction Factors:   Responsible for children under 18 years of  age, Sense of responsibility to family, Living with another person, especially a relative and Positive social support  Continued Clinical Symptoms:  Schizophrenia:   Less than 40 years old  Cognitive Features That Contribute To Risk:  Loss of executive function    Suicide Risk:  Mild:  Suicidal ideation of limited frequency, intensity, duration, and specificity.  There are no identifiable plans, no associated intent, mild dysphoria and related symptoms, good self-control (both objective and subjective assessment), few other risk factors, and identifiable protective factors, including available and accessible social support.  Discharge Diagnoses:   AXIS I:  Schizophrenia AXIS II:  Deferred AXIS III:   Past Medical History  Diagnosis Date  . Anxiety   . Asthma   . Schizophrenia    AXIS IV:  economic problems and educational problems AXIS V:  61-70 mild symptoms at this time symptoms improved/mild  Plan Of Care/Follow-up recommendations:  Activity:  as tolerated , Diet: unchanged, as prior to admission  Is patient on multiple antipsychotic therapies at discharge:  No   Has Patient had three or more failed trials of antipsychotic monotherapy by history:  No  Recommended Plan for Multiple Antipsychotic Therapies: NA    Fernando Cobos 03/28/2014, 12:17 PM 

## 2014-03-28 NOTE — Progress Notes (Signed)
[facility] - 0900  The focus of this group is to educate the patient on the purpose and policies of crisis stabilization and provide a format to answer questions about their admission.  The group details unit policies and expectations of patients while admitted. 

## 2014-03-28 NOTE — [facility] Suicide Risk Assessment (Signed)
   Demographic Factors:  Adolescent or young adult, Low socioeconomic status and Unemployed  Total Time spent with patient: 30 minutes  Psychiatric Specialty Exam: Physical Exam  ROS  Blood pressure 102/71, pulse 109, temperature 97 F (36.1 C), temperature source Oral, resp. rate 16, height 5\' 2"  (1.575 m), weight 92.08 kg (203 lb), SpO2 98.00%.Body mass index is 37.12 kg/(m^2).  General Appearance: Casual  Eye Contact::  Good  Speech:  Clear and Coherent and Slow  Volume:  Normal  Mood:  Euphoric  Affect:  Appropriate and Congruent  Thought Process:  Coherent and Goal Directed  Orientation:  Full (Time, Place, and Person)  Thought Content:  WDL  Suicidal Thoughts:  No  Homicidal Thoughts:  No  Memory:  Immediate;   Good Recent;   Good  Judgement:  Intact  Insight:  Fair  Psychomotor Activity:  Decreased  Concentration:  Fair  Recall:  Fair  Fund of Knowledge:Fair  Language: Good  Akathisia:  NA  Handed:  Right  AIMS (if indicated):     Assets:  Communication Skills Desire for Improvement Financial Resources/Insurance Housing Intimacy Leisure Time Physical Health Resilience Social Support  Sleep:  Number of Hours: 5.75    Musculoskeletal: Strength & Muscle Tone: within normal limits Gait & Station: normal Patient leans: N/A   Mental Status Per Nursing Assessment::   On Admission:  Suicidal ideation indicated by patient  Current Mental Status by Physician: NA  Loss Factors: NA  Historical Factors: NA  Risk Reduction Factors:   Sense of responsibility to family, Religious beliefs about death, Living with another person, especially a relative, Positive social support and Positive therapeutic relationship  Continued Clinical Symptoms:  Schizophrenia:   Paranoid or undifferentiated type Previous Psychiatric Diagnoses and Treatments  Cognitive Features That Contribute To Risk:  Polarized thinking    Suicide Risk:  Minimal: No identifiable suicidal  ideation.  Patients presenting with no risk factors but with morbid ruminations; may be classified as minimal risk based on the severity of the depressive symptoms  Discharge Diagnoses:   AXIS I:  Schizoaffective Disorder AXIS II:  Deferred AXIS III:   Past Medical History  Diagnosis Date  . Anxiety   . Asthma   . Schizophrenia    AXIS IV:  other psychosocial or environmental problems, problems related to social environment and problems with primary support group AXIS V:  61-70 mild symptoms  Plan Of Care/Follow-up recommendations:  Activity:  As tolerated Diet:  Regular  Is patient on multiple antipsychotic therapies at discharge:  No   Has Patient had three or more failed trials of antipsychotic monotherapy by history:  No  Recommended Plan for Multiple Antipsychotic Therapies: NA    Janardhaha R Treyton Slimp 03/28/2014, 11:03 AM

## 2014-03-28 NOTE — Progress Notes (Signed)
Pt discharged per MD orders; pt currently denies SI/HI and auditory/visual hallucinations; pt was given education by RN regarding follow-up appointments and medications and pt denied any questions or concerns about these instructions; pt was then escorted to search room to retrieve her belongings by RN before being discharged to hospital lobby. 

## 2014-03-28 NOTE — Progress Notes (Signed)
[facility] Adult Case Management Discharge Plan :  Will you be returning to the same living situation after discharge: Yes,  returning home with mother At discharge, do you have transportation home?:Yes,  [facility] transitional care team will transport pt home today Do you have the ability to pay for your medications:Yes,  provided pt with prescriptions and pt verbalizes ability to afford meds  Release of information consent forms completed and in the chart;  Patient's signature needed at discharge.  Patient to Follow up at: Follow-up Information   Follow up with [facility] On 04/11/2014. (Appointment scheduled at 9:00 am with Lindley Magnus for medication management)    Contact information:   [address], [place] [address] Phone: [phone] Fax: [phone]      Patient denies SI/HI:   Yes,  denies SI/HI    Safety Planning and Suicide Prevention discussed:  Yes,  discussed with pt and pt's mother.  See suicide prevention education note.   Kathy Brown 03/28/2014, 11:09 AM

## 2014-03-31 NOTE — Progress Notes (Signed)
Patient Discharge Instructions:  After Visit Summary (AVS):   Faxed to:  03/31/14 Discharge Summary Note:   Faxed to:  03/31/14 Psychiatric Admission Assessment Note:   Faxed to:  03/31/14 Suicide Risk Assessment - Discharge Assessment:   Faxed to:  03/31/14 Faxed/Sent to the Next Level Care provider:  03/31/14 Faxed to [facility] @ [phone]  Jerelene Redden, 03/31/2014, 1:53 PM

## 2014-05-11 ENCOUNTER — Encounter (HOSPITAL_COMMUNITY): Payer: Self-pay | Admitting: Emergency Medicine

## 2014-05-11 ENCOUNTER — Emergency Department (HOSPITAL_COMMUNITY)
Admission: EM | Admit: 2014-05-11 | Discharge: 2014-05-11 | Disposition: A | Discharge status: Home / Self Care | Payer: Medicare Other | Attending: Emergency Medicine | Admitting: Emergency Medicine

## 2014-05-11 DIAGNOSIS — F22 Delusional disorders: Secondary | ICD-10-CM | POA: Insufficient documentation

## 2014-05-11 DIAGNOSIS — F4322 Adjustment disorder with anxiety: Secondary | ICD-10-CM

## 2014-05-11 DIAGNOSIS — Z79899 Other long term (current) drug therapy: Secondary | ICD-10-CM | POA: Diagnosis not present

## 2014-05-11 DIAGNOSIS — F209 Schizophrenia, unspecified: Secondary | ICD-10-CM | POA: Diagnosis not present

## 2014-05-11 DIAGNOSIS — J45909 Unspecified asthma, uncomplicated: Secondary | ICD-10-CM | POA: Diagnosis not present

## 2014-05-11 DIAGNOSIS — F411 Generalized anxiety disorder: Secondary | ICD-10-CM | POA: Insufficient documentation

## 2014-05-11 MED ORDER — HALOPERIDOL 1 MG PO TABS
0.5000 mg | ORAL_TABLET | Freq: Two times a day (BID) | ORAL | Status: DC
  Administered 2014-05-11: 0.5 mg via ORAL
  Filled 2014-05-11: qty 1

## 2014-05-11 MED ORDER — ACETAMINOPHEN 325 MG PO TABS: 650.0000 mg | ORAL_TABLET | ORAL | Status: DC | PRN

## 2014-05-11 MED ORDER — ONDANSETRON HCL 4 MG PO TABS: 4.0000 mg | ORAL_TABLET | Freq: Three times a day (TID) | ORAL | Status: DC | PRN

## 2014-05-11 MED ORDER — IBUPROFEN 200 MG PO TABS: 600.0000 mg | ORAL_TABLET | Freq: Three times a day (TID) | ORAL | Status: DC | PRN

## 2014-05-11 MED ORDER — BENZTROPINE MESYLATE 1 MG PO TABS
0.5000 mg | ORAL_TABLET | Freq: Every day | ORAL | Status: DC
  Administered 2014-05-11: 0.5 mg via ORAL
  Filled 2014-05-11: qty 1

## 2014-05-11 MED ORDER — HALOPERIDOL LACTATE 5 MG/ML IJ SOLN
3.0000 mg | Freq: Once | INTRAMUSCULAR | Status: AC
  Administered 2014-05-11: 3 mg via INTRAMUSCULAR
  Filled 2014-05-11: qty 1

## 2014-05-11 MED ORDER — TRAZODONE HCL 100 MG PO TABS: 100.0000 mg | ORAL_TABLET | Freq: Every day | ORAL | Status: DC

## 2014-05-11 MED ORDER — LORAZEPAM 1 MG PO TABS: 1.0000 mg | ORAL_TABLET | Freq: Three times a day (TID) | ORAL | Status: DC | PRN

## 2014-05-11 MED ORDER — RISPERIDONE 1 MG PO TABS
1.0000 mg | ORAL_TABLET | Freq: Two times a day (BID) | ORAL | Status: DC
  Administered 2014-05-11: 1 mg via ORAL
  Filled 2014-05-11: qty 1

## 2014-05-11 NOTE — ED Notes (Signed)
Pt brought in to the hospital this morning by EMS after she had called 911 three separate times in the past 24 hrs telling them that she was scared to be home by herself    Pt has hx of schizophrenia

## 2014-05-11 NOTE — [facility] Suicide Risk Assessment (Cosign Needed)
Suicide Risk Assessment  Discharge Assessment     Demographic Factors:  Black female  Total Time spent with patient: 45 minutes Psychiatric Specialty Exam:      Blood pressure 127/59, pulse 90, temperature 97.8 F (36.6 C), temperature source Oral, resp. rate 18, SpO2 98.00%.There is no weight on file to calculate BMI.   General Appearance: Bizarre   Eye Contact:: Good   Speech: Clear and Coherent and Normal Rate   Volume: Normal   Mood: Anxious   Affect: Congruent   Thought Process: Circumstantial   Orientation: Full (Time, Place, and Person)   Thought Content: Rumination   Suicidal Thoughts: No   Homicidal Thoughts: No   Memory: Immediate; Good  Recent; Fair  Remote; Fair   Judgement: Fair   Insight: Present   Psychomotor Activity: Normal   Concentration: Fair   Recall: [employer]   Fund of Knowledge:Fair   Language: Fair   Akathisia: No   Handed: Right   AIMS (if indicated):   Assets: Communication Skills  Desire for Improvement  Housing  Social Support   Sleep:   Musculoskeletal:  Strength & Muscle Tone: within normal limits  Gait & Station: normal  Patient leans: N/A    Mental Status Per Nursing Assessment::   On Admission:     Current Mental Status by Physician: Patient denies suicidal/homicidal ideatioon, psychosis  Loss Factors: NA  Historical Factors: NA  Risk Reduction Factors:   Sense of responsibility to family, Living with another person, especially a relative and Positive social support  Continued Clinical Symptoms:  Previous Psychiatric Diagnoses and Treatments  Cognitive Features That Contribute To Risk:  Closed-mindedness    Suicide Risk:  Minimal: No identifiable suicidal ideation.  Patients presenting with no risk factors but with morbid ruminations; may be classified as minimal risk based on the severity of the depressive symptoms  Discharge Diagnoses:  AXIS I: Adjustment Disorder with Anxiety and Paranoia  AXIS II: Deferred   AXIS III:  Past Medical History   Diagnosis  Date   .  Anxiety    .  Asthma    .  Schizophrenia     AXIS IV: other psychosocial or environmental problems  AXIS V: 61-70 mild symptoms  Plan Of Care/Follow-up recommendations:  Activity:  Resume usual activity Diet:  Resume usual diet  Is patient on multiple antipsychotic therapies at discharge:  No   Has Patient had three or more failed trials of antipsychotic monotherapy by history:  No  Recommended Plan for Multiple Antipsychotic Therapies: NA    Johnella Crumm, FNP-BC 05/11/2014, 1:41 PM

## 2014-05-11 NOTE — ED Provider Notes (Signed)
CSN: [id number]     Arrival date & time 05/11/14  [id number] History   First MD Initiated Contact with Patient 05/11/14 0701     Chief Complaint  Patient presents with  . Paranoid     (Consider location/radiation/quality/duration/timing/severity/associated sxs/prior Treatment) HPI  32yF with paranoia. Hx of schizophrenia. Pt concerned that someone might be in her home and is very scared about this. Denies hallucinations though. Pt lives with mother. [place] family [place]. Pt unsure how long she will be gone for. Rare for mother to leave like this. Apparently called 911 several times reporting that she was scared to be alone. Pt says she has no other family in the area or anyone else she can stay with or can stay with her. Reports compliance with medications, but feels like they aren't working. No SI/HI.  Past Medical History  Diagnosis Date  . Anxiety   . Asthma   . Schizophrenia    History reviewed. No pertinent past surgical history. History reviewed. No pertinent family history. History  Substance Use Topics  . Smoking status: Never Smoker   . Smokeless tobacco: Not on file  . Alcohol Use: No   OB History   Grav Para Term Preterm Abortions TAB SAB Ect Mult Living                 Review of Systems  All systems reviewed and negative, other than as noted in HPI.   Allergies  Review of patient's allergies indicates no known allergies.  Home Medications   Prior to Admission medications   Medication Sig Start Date End Date Taking? Authorizing Provider  benztropine (COGENTIN) 0.5 MG tablet Take 0.5 mg by mouth daily.   Yes Historical Provider, MD  haloperidol (HALDOL) 0.5 MG tablet Take 0.5 mg by mouth 2 (two) times daily.   Yes Historical Provider, MD  haloperidol decanoate (HALDOL DECANOATE) 100 MG/ML injection Inject 100 mg into the muscle every 30 (thirty) days. 05/08/14  Yes Historical Provider, MD  risperiDONE  (RISPERDAL) 1 MG tablet Take 1 tablet (1 mg total) by mouth 2 (two) times daily. 03/28/14  Yes Verne SpurrNeil Mashburn, PA-C  traZODone (DESYREL) 100 MG tablet Take 1 tablet (100 mg total) by mouth at bedtime. 03/28/14  Yes Neil Mashburn, PA-C   BP 125/67  Pulse 102  Temp(Src) 97.8 F (36.6 C) (Oral)  Resp 18  SpO2 98% Physical Exam  Nursing note and vitals reviewed. Constitutional: She appears well-developed and well-nourished. No distress.  HENT:  Head: Normocephalic and atraumatic.  Eyes: Conjunctivae are normal. Pupils are equal, round, and reactive to light. Right eye exhibits no discharge. Left eye exhibits no discharge.  Neck: Neck supple.  Cardiovascular: Normal rate, regular rhythm and normal heart sounds.  Exam reveals no gallop and no friction rub.   No murmur heard. Pulmonary/Chest: Effort normal and breath sounds normal. No respiratory distress.  Abdominal: Soft. She exhibits no distension. There is no tenderness.  Musculoskeletal: She exhibits no edema and no tenderness.  Neurological: She is alert. No cranial nerve deficit. She exhibits normal muscle tone.  Skin: Skin is warm and dry.  Psychiatric:  Speech clear. Short attention span. Paranoid thought.     ED Course  Procedures (including critical care time) Labs Review Labs Reviewed - No data to display  Imaging Review No results found.   EKG Interpretation None      MDM   Final diagnoses:  Paranoia  Raeford Razor, MD 05/17/14 [phone]

## 2014-05-11 NOTE — Discharge Instructions (Signed)
Paranoia °Paranoia is a distrust of others that is not based on a real reason for distrust. This may reach delusional levels. This means the paranoid person feels the world is against them when there is no reason to make them feel that way. People with paranoia feel as though people around them are "out to get them".  °SIMILAR MENTAL ILNESSES °· Depression is a feeling as though you are down all the time. It is normal in some situations where you have just lost a loved one. It is abnormal if you are having feelings of paranoia with it. °· Dementia is a physical problem with the brain in which the brain no longer works properly. There are problems with daily activities of living. Alzheimer's disease is one example of this. Dementia is also caused by old age changes in the brain which come with the death of brain cells and small strokes. °· Paranoidschizophrenia. People with paranoid schizophrenia and persecutory delusional disorder have delusions in which they feel people around them are plotting against them. Persecutory delusions in paranoid schizophrenia are bizarre, sometimes grandiose, and often accompanied by auditory hallucinations. This means the person is hearing voices that are not there. °· Delusionaldisorder (persecutory type). Delusions experienced by individuals with delusional disorder are more believable than those experienced by paranoid schizophrenics; they are not bizarre, though still unjustified. Individuals with delusional disorder may seem offbeat or quirky rather than mentally ill, and therefore, may never seek treatment. °All of these problems usually do not allow these people to interact socially in an acceptable manner. °CAUSES °The cause of paranoia is often not known. It is common in people with extended abuse of: °· Cocaine. °· Amphetamine. °· Marijuana. °· Alcohol. °Sometimes there is an inherited tendency. It may be associated with stress or changes in brain chemistry. °DIAGNOSIS    °When paranoia is present, your caregiver may: °· Refer you to a specialist. °· Do a physical exam. °· Perform other tests on you to make sure there are not other problems causing the paranoia including: °¨ Physical problems. °¨ Mental problems. °¨ Chemical problems (other than drugs). °Testing may be done to determine if there is a psychiatric disability present that can be treated with medicine. °TREATMENT  °· Paranoia that is a symptom of a psychiatric problem should be treated by professionals. °· Medicines are available which can help this disorder. Antipsychotic medicine may be prescribed by your caregiver. °· Sometimes psychotherapy may be useful. °· Conditions such as depression or drug abuse are treated individually. If the paranoia is caused by drug abuse, a treatment facility may be helpful. Depression may be helped by antidepressants. °PROGNOSIS  °· Paranoid people are difficult to treat because of their belief that everyone is out to get them or harm them. Because of this mistrust, they often must be talked into entering treatment by a trusted family member or friend. They may not want to take medicine as they may see this as an attempt to poison them. °· Gradual gains in the trust of a therapist or caregiver helps in a successful treatment plan. °· Some people with PPD or persecutory delusional disorder function in society without treatment in limited fashion. °Document Released: 10/16/2003 Document Revised: 01/05/2012 Document Reviewed: 06/20/2008 °ExitCare® Patient Information ©2015 [employer]. This information is not intended to replace advice given to you by your health care provider. Make sure you discuss any questions you have with your health care provider. ° °

## 2014-05-11 NOTE — Consult Note (Signed)
[facility] Face-to-Face Psychiatry Consult   Reason for Consult:  Paranoia  Referring Physician:  EDP  Kathy Brown is an 33 y.o. female. Total Time spent with patient: 45 minutes  Assessment: AXIS I:  Adjustment Disorder with Anxiety and Paranoia AXIS II:  Deferred AXIS III:   Past Medical History  Diagnosis Date  . Anxiety   . Asthma   . Schizophrenia    AXIS IV:  other psychosocial or environmental problems AXIS V:  61-70 mild symptoms  Plan:  No evidence of imminent risk to self or others at present.   Patient does not meet criteria for psychiatric inpatient admission. Supportive therapy provided about ongoing stressors. Discussed crisis plan, support from social network, calling 911, coming to the Emergency Department, and calling Suicide Hotline.  Subjective:   Kathy Brown is a 33 y.o. female patient.  HPI:  Patient states that she lives with her mother and her 3 children. "MY mother and kids went to [facility] Wednesday.  The care was to small for all of [place].  I just got scared and thought some of the people in the neighborhood come after me.  I don't know when she is coming back. I tried to call her two time but go to voice mail."  Patient denies suicidal/homicidal ideation, and psychosis.  Patient states that she has no family in [place].    HPI Elements:   Location:  Paranoia. Quality:  Afraid of being in house alone. Severity:  Feel that the people in neighborhood will come after her if know she is alone. Timing:  1 day.  Past Psychiatric History: Past Medical History  Diagnosis Date  . Anxiety   . Asthma   . Schizophrenia     reports that she has never smoked. She does not have any smokeless tobacco history on file. She reports that she does not drink alcohol or use illicit drugs. History reviewed. No pertinent family history.         Allergies:  No Known Allergies  ACT Assessment Complete:  Yes:    Educational Status    Risk to Self: Risk to self Is patient  at risk for suicide?: No, but patient needs Medical Clearance Substance abuse history and/or treatment for substance abuse?: No  Risk to Others:    Abuse:    Prior Inpatient Therapy:    Prior Outpatient Therapy:    Additional Information:      Objective: Blood pressure 127/59, pulse 90, temperature 97.8 F (36.6 C), temperature source Oral, resp. rate 18, SpO2 98.00%.There is no weight on file to calculate BMI.No results found for this or any previous visit (from the past 72 hour(s)). Labs are reviewed see above values.  Medications reviewed and no changes made  Current Facility-Administered Medications  Medication Dose Route Frequency Provider Last Rate Last Dose  . acetaminophen (TYLENOL) tablet 650 mg  650 mg Oral Q4H PRN Raeford Razor, MD      . benztropine (COGENTIN) tablet 0.5 mg  0.5 mg Oral Daily Raeford Razor, MD      . haloperidol (HALDOL) tablet 0.5 mg  0.5 mg Oral BID Raeford Razor, MD      . ibuprofen (ADVIL,MOTRIN) tablet 600 mg  600 mg Oral Q8H PRN Raeford Razor, MD      . LORazepam (ATIVAN) tablet 1 mg  1 mg Oral Q8H PRN Raeford Razor, MD      . ondansetron [facility]) tablet 4 mg  4 mg Oral Q8H PRN Raeford Razor, MD      .  risperiDONE (RISPERDAL) tablet 1 mg  1 mg Oral BID Raeford RazorStephen Kohut, MD      . traZODone (DESYREL) tablet 100 mg  100 mg Oral QHS Raeford RazorStephen Kohut, MD       Current Outpatient Prescriptions  Medication Sig Dispense Refill  . benztropine (COGENTIN) 0.5 MG tablet Take 0.5 mg by mouth daily.      . haloperidol (HALDOL) 0.5 MG tablet Take 0.5 mg by mouth 2 (two) times daily.      . haloperidol decanoate (HALDOL DECANOATE) 100 MG/ML injection Inject 100 mg into the muscle every 30 (thirty) days.      . risperiDONE (RISPERDAL) 1 MG tablet Take 1 tablet (1 mg total) by mouth 2 (two) times daily.  60 tablet  0  . traZODone (DESYREL) 100 MG tablet Take 1 tablet (100 mg total) by mouth at bedtime.  30 tablet  0    Psychiatric Specialty Exam:     Blood pressure  127/59, pulse 90, temperature 97.8 F (36.6 C), temperature source Oral, resp. rate 18, SpO2 98.00%.There is no weight on file to calculate BMI.  General Appearance: Bizarre  Eye Contact::  Good  Speech:  Clear and Coherent and Normal Rate  Volume:  Normal  Mood:  Anxious  Affect:  Congruent  Thought Process:  Circumstantial  Orientation:  Full (Time, Place, and Person)  Thought Content:  Paranoid Ideation  Suicidal Thoughts:  No  Homicidal Thoughts:  No  Memory:  Immediate;   Good Recent;   Fair Remote;   Fair  Judgement:  Fair  Insight:  Present  Psychomotor Activity:  Normal  Concentration:  Fair  Recall:  [employer]  Fund of Knowledge:Fair  Language: Fair  Akathisia:  No  Handed:  Right  AIMS (if indicated):     Assets:  Communication Skills Desire for Improvement Housing Social Support  Sleep:      Musculoskeletal: Strength & Muscle Tone: within normal limits Gait & Station: normal Patient leans: N/A  Treatment Plan Summary: Discharge home.  Follow up with primary psych provider  Assunta FoundRankin, Shuvon, FNP-BC 05/11/2014 1:32 PM   Patient seen for a face-to-face psychiatric evaluation and examination along with the physician assistant, case discussed and formulated at appropriate treatment plan.Reviewed the information documented and agree with the treatment plan.  Telford Archambeau,JANARDHAHA R. 05/11/2014 5:26 PM

## 2014-05-11 NOTE — ED Notes (Signed)
Pt states her mother went out of town and left her home alone and she is scared to be there by herself

## 2014-05-11 NOTE — Progress Notes (Signed)
CSW provided bus pass and directions home. CSW and pt discussed directions home. Pt thanked CSW for resources. CSW requested RN and tech to assist pt to bus stop.   Kathy LasterAlexandra Ladarrious Brown LCSWA,     ED CSW  phone: [phone] 1:43pm

## 2014-05-11 NOTE — ED Notes (Signed)
Pt up at bedside eating.

## 2014-07-19 ENCOUNTER — Encounter (HOSPITAL_COMMUNITY): Payer: Self-pay | Admitting: Emergency Medicine

## 2014-07-19 ENCOUNTER — Emergency Department (HOSPITAL_COMMUNITY)
Admission: EM | Admit: 2014-07-19 | Discharge: 2014-07-19 | Disposition: A | Discharge status: Home / Self Care | Payer: Medicare Other | Attending: Emergency Medicine | Admitting: Emergency Medicine

## 2014-07-19 DIAGNOSIS — Z8659 Personal history of other mental and behavioral disorders: Secondary | ICD-10-CM | POA: Diagnosis not present

## 2014-07-19 DIAGNOSIS — Z79899 Other long term (current) drug therapy: Secondary | ICD-10-CM | POA: Diagnosis not present

## 2014-07-19 DIAGNOSIS — G8929 Other chronic pain: Secondary | ICD-10-CM | POA: Diagnosis not present

## 2014-07-19 DIAGNOSIS — J45909 Unspecified asthma, uncomplicated: Secondary | ICD-10-CM | POA: Insufficient documentation

## 2014-07-19 DIAGNOSIS — M79609 Pain in unspecified limb: Secondary | ICD-10-CM | POA: Insufficient documentation

## 2014-07-19 DIAGNOSIS — M79604 Pain in right leg: Secondary | ICD-10-CM

## 2014-07-19 HISTORY — DX: Other chronic pain: G89.29

## 2014-07-19 HISTORY — DX: Dorsalgia, unspecified: M54.9

## 2014-07-19 MED ORDER — HYDROCODONE-ACETAMINOPHEN 5-325 MG PO TABS: 1.0000 | ORAL_TABLET | Freq: Four times a day (QID) | ORAL | Status: DC | PRN

## 2014-07-19 MED ORDER — IBUPROFEN 800 MG PO TABS: 800.0000 mg | ORAL_TABLET | Freq: Three times a day (TID) | ORAL | Status: DC | PRN

## 2014-07-19 NOTE — Discharge Instructions (Signed)
Return here as needed.  Followup with your primary care Dr. as scheduled °

## 2014-07-19 NOTE — ED Provider Notes (Signed)
CSN: [id number]     Arrival date & time 07/19/14  1038 History   First MD Initiated Contact with Patient 07/19/14 1052     Chief Complaint  Patient presents with  . Leg Pain     (Consider location/radiation/quality/duration/timing/severity/associated sxs/prior Treatment) HPI Patient presents to the emergency department with right leg pain, that she states has been ongoing for the last 2 days.  She states that he hurts all over and she points to her calf is the main spot.  Patient denies chest pain, shortness of breath, nausea, vomiting, weakness, dizziness, headache, blurred vision, back pain, neck pain, dysuria, incontinence, fever or syncope patient, states nothing seems make her condition, better and palpation makes the pain, worse.  Patient did not take any medications prior to route Past Medical History  Diagnosis Date  . Anxiety   . Asthma   . Schizophrenia   . Chronic back pain    History reviewed. No pertinent past surgical history. History reviewed. No pertinent family history. History  Substance Use Topics  . Smoking status: Never Smoker   . Smokeless tobacco: Not on file  . Alcohol Use: No   OB History   Grav Para Term Preterm Abortions TAB SAB Ect Mult Living                 Review of Systems All other systems negative except as documented in the HPI. All pertinent positives and negatives as reviewed in the HPI.  Allergies  Review of patient's allergies indicates no known allergies.  Home Medications   Prior to Admission medications   Medication Sig Start Date End Date Taking? Authorizing Provider  haloperidol (HALDOL) 0.5 MG tablet Take 0.5 mg by mouth 2 (two) times daily.   Yes Historical Provider, MD  haloperidol (HALDOL) 2 MG tablet Take 2 mg by mouth at bedtime.   Yes Historical Provider, MD  haloperidol decanoate (HALDOL DECANOATE) 100 MG/ML injection Inject 100 mg into the muscle every 30 (thirty) days. 05/08/14  Yes Historical Provider, MD  traZODone  (DESYREL) 50 MG tablet Take 50 mg by mouth at bedtime as needed for sleep.   Yes Historical Provider, MD   BP 122/70  Pulse 82  Temp(Src) 98.9 F (37.2 C) (Oral)  Resp 20  SpO2 98%  LMP 07/19/2014 Physical Exam  Nursing note and vitals reviewed. Constitutional: She is oriented to person, place, and time. She appears well-developed and well-nourished.  HENT:  Head: Normocephalic and atraumatic.  Mouth/Throat: Oropharynx is clear and moist.  Eyes: Pupils are equal, round, and reactive to light.  Neck: Normal range of motion. Neck supple.  Cardiovascular: Normal rate, regular rhythm and normal heart sounds.  Exam reveals no gallop and no friction rub.   No murmur heard. Pulmonary/Chest: Effort normal and breath sounds normal. No respiratory distress.  Musculoskeletal:       Right lower leg: She exhibits tenderness. She exhibits no bony tenderness, no swelling, no edema and no deformity.       Legs: Neurological: She is alert and oriented to person, place, and time. She exhibits normal muscle tone. Coordination normal.  Skin: Skin is warm and dry. No rash noted. No erythema.    ED Course  Procedures (including critical care time) Labs Review Labs Reviewed - No data to display   Patient has negative venous doppler. Will be referred back to her PCP and has an appointment for tomorrow.  Carlyle Dolly, PA-C 07/20/14 [phone]

## 2014-07-19 NOTE — ED Notes (Signed)
Pt from via [facility] with c/o increasing right leg pain for the last several days.  Pt reports it has hurted for several years since she fell on ice.  Pt ambulatory in room, NAD, alert.

## 2014-07-19 NOTE — ED Notes (Signed)
Pt has ambulated to restroom numerous times with ease.

## 2014-07-19 NOTE — ED Notes (Signed)
Patient up to the bathroom. Patient walked back in the room and closed the door. Patient made aware she is waiting for Venous Doppler.

## 2014-07-19 NOTE — Progress Notes (Signed)
VASCULAR LAB PRELIMINARY  PRELIMINARY  PRELIMINARY  PRELIMINARY  Right lower extremity venous duplex completed.    Preliminary report:  Right:  No evidence of DVT, superficial thrombosis, or Baker's cyst.  Kathy Brown, RVS 07/19/2014, 3:14 PM

## 2014-07-19 NOTE — Progress Notes (Signed)
  CARE MANAGEMENT ED NOTE 07/19/2014  Patient:  Kathy Brown, Kathy Brown   Account Number:  [id number]  Date Initiated:  07/19/2014  Documentation initiated by:  Ferdinand Cava  Subjective/Objective Assessment:   33 yo female presenting to the ED with c/o leg pain     Subjective/Objective Assessment Detail:     Action/Plan:   Patient will follow up with PCP tomorrow and Medicaid transportation has been arranged.   Action/Plan Detail:   Anticipated DC Date:       Status Recommendation to Physician:   Result of Recommendation:  Agreed    DC Planning Services  CM consult  PCP issues  Other    Choice offered to / List presented to:            Status of service:  Completed, signed off  ED Comments:   ED Comments Detail:  This CM spoke with patient regarding ED visit and patient stated she was here for leg pain. This CM spoke with patient about PCP and patient provided her Medicaid card to this CM and PCP provider listed on the card. The patient stated to this CM that she hasn't been to her provider in a while but could not specify. The patient did state that she follows up at [facility] for her mental health care and medication management. This Cm contacted [facility]gent [facility] and they stated that the patient can come in tomorrow before 7pm. The patient stated that her preference was in the morning. This CM contacted Medicaid transportation and scheduled pick up at 9:10 at patients home and then [facility] transportation will pick patient up after appointment at 12:30 to return home. This information was written down for patient and patient read back to CM. The patient verbalized understanding and had no other questions or concerns.

## 2014-07-19 NOTE — ED Notes (Signed)
PA at the bedside.

## 2014-07-19 NOTE — ED Notes (Signed)
The pt thinks she is  Ready to go home

## 2014-07-21 NOTE — ED Provider Notes (Signed)
Medical screening examination/treatment/procedure(s) were performed by non-physician practitioner and as supervising physician I was immediately available for consultation/collaboration.   EKG Interpretation None        Samuel Jester, DO 07/21/14 2147

## 2014-09-19 ENCOUNTER — Encounter (HOSPITAL_COMMUNITY): Payer: Self-pay | Admitting: Emergency Medicine

## 2014-09-19 ENCOUNTER — Emergency Department (HOSPITAL_COMMUNITY)
Admission: EM | Admit: 2014-09-19 | Discharge: 2014-09-20 | Disposition: A | Discharge status: Home / Self Care | Payer: Medicare Other | Attending: Emergency Medicine | Admitting: Emergency Medicine

## 2014-09-19 DIAGNOSIS — Z79899 Other long term (current) drug therapy: Secondary | ICD-10-CM | POA: Diagnosis not present

## 2014-09-19 DIAGNOSIS — G8929 Other chronic pain: Secondary | ICD-10-CM | POA: Insufficient documentation

## 2014-09-19 DIAGNOSIS — J45909 Unspecified asthma, uncomplicated: Secondary | ICD-10-CM | POA: Insufficient documentation

## 2014-09-19 DIAGNOSIS — F419 Anxiety disorder, unspecified: Secondary | ICD-10-CM | POA: Insufficient documentation

## 2014-09-19 DIAGNOSIS — F22 Delusional disorders: Secondary | ICD-10-CM

## 2014-09-19 DIAGNOSIS — Z3202 Encounter for pregnancy test, result negative: Secondary | ICD-10-CM | POA: Diagnosis not present

## 2014-09-19 DIAGNOSIS — F200 Paranoid schizophrenia: Secondary | ICD-10-CM | POA: Insufficient documentation

## 2014-09-19 DIAGNOSIS — R443 Hallucinations, unspecified: Secondary | ICD-10-CM | POA: Diagnosis present

## 2014-09-19 DIAGNOSIS — F259 Schizoaffective disorder, unspecified: Secondary | ICD-10-CM | POA: Diagnosis present

## 2014-09-19 LAB — COMPREHENSIVE METABOLIC PANEL
ALT: 9 U/L (ref 0–35)
AST: 13 U/L (ref 0–37)
Albumin: 3.8 g/dL (ref 3.5–5.2)
Alkaline Phosphatase: 61 U/L (ref 39–117)
Anion gap: 13 (ref 5–15)
BILIRUBIN TOTAL: 0.3 mg/dL (ref 0.3–1.2)
BUN: 7 mg/dL (ref 6–23)
CHLORIDE: 100 meq/L (ref 96–112)
CO2: 25 meq/L (ref 19–32)
Calcium: 9.7 mg/dL (ref 8.4–10.5)
Creatinine, Ser: 0.76 mg/dL (ref 0.50–1.10)
GLUCOSE: 102 mg/dL — AB (ref 70–99)
POTASSIUM: 3.9 meq/L (ref 3.7–5.3)
SODIUM: 138 meq/L (ref 137–147)
Total Protein: 7.7 g/dL (ref 6.0–8.3)

## 2014-09-19 LAB — CBC WITH DIFFERENTIAL/PLATELET
Basophils Absolute: 0 10*3/uL (ref 0.0–0.1)
Basophils Relative: 1 % (ref 0–1)
Eosinophils Absolute: 0.1 10*3/uL (ref 0.0–0.7)
Eosinophils Relative: 1 % (ref 0–5)
HCT: 36.2 % (ref 36.0–46.0)
Hemoglobin: 12 g/dL (ref 12.0–15.0)
LYMPHS ABS: 1.3 10*3/uL (ref 0.7–4.0)
LYMPHS PCT: 20 % (ref 12–46)
MCH: 30.2 pg (ref 26.0–34.0)
MCHC: 33.1 g/dL (ref 30.0–36.0)
MCV: 91.2 fL (ref 78.0–100.0)
Monocytes Absolute: 0.4 10*3/uL (ref 0.1–1.0)
Monocytes Relative: 7 % (ref 3–12)
NEUTROS ABS: 4.6 10*3/uL (ref 1.7–7.7)
NEUTROS PCT: 71 % (ref 43–77)
PLATELETS: 226 10*3/uL (ref 150–400)
RBC: 3.97 MIL/uL (ref 3.87–5.11)
RDW: 13.2 % (ref 11.5–15.5)
WBC: 6.4 10*3/uL (ref 4.0–10.5)

## 2014-09-19 LAB — ETHANOL

## 2014-09-19 LAB — RAPID URINE DRUG SCREEN, HOSP PERFORMED
Amphetamines: NOT DETECTED
BARBITURATES: NOT DETECTED
Benzodiazepines: NOT DETECTED
Cocaine: NOT DETECTED
Opiates: NOT DETECTED
Tetrahydrocannabinol: NOT DETECTED

## 2014-09-19 LAB — POC URINE PREG, ED: PREG TEST UR: NEGATIVE

## 2014-09-19 NOTE — ED Notes (Signed)
SW called and message left regarding pt needing to be evaluated.

## 2014-09-19 NOTE — ED Notes (Signed)
SW called and was told the pt would be evaluated soon.

## 2014-09-19 NOTE — ED Notes (Signed)
Bed: [facility] Expected date:  Expected time:  Means of arrival:  Comments: EMS-schitzo

## 2014-09-19 NOTE — BH Assessment (Signed)
TTS assessment completed. Medical laboratory scientific officerWriter clinicals by provider, Assunta FoundShuvon Rankin, NP. Patient to remain in the ED overnight for observation, per Assunta FoundShuvon Rankin, NP. Psychiatry will reassess in the am to determine disposition.

## 2014-09-19 NOTE — Progress Notes (Signed)
ED CM received a call from TCU RN stating she had a call from the mother confirming she was "[place]", "we had talked about this for a month" and she did "take her kids"   ED CM received a voice message left by pt's mother.  Cm returned a call to mother at 1700 to  [phone].  Mother states she left pt home "because she did not want to go"  When asked who the legal guardian is, the mother replied "I'm her mother"  CM asked who receives the pt's "check" and the mother stated "I get her check"  CM informed mother if she receives pt's "check" then she is pt's legal guardian in [place].  Cm asked mother about how pt was left at home. Inquired about food, meds, furniture and emergency contacts for pt. Mother states she left pt with "some food", "her tv and her medicine bottles that had been filled" Mother confirms she did take "the kids tv and my tv" Mother reports "before when I left she let some boys in and they stole all out things"  Confirmed pt pcp and that [facility].  Mother states,  "I did not know" it was not a good idea to leave pt alone.  Reports she is leaving [place] on Saturday and [place]" Reports she has a brother in [place], "Molly MaduroRobert" [phone] that she will call to update him so if pt is d/c he can come to pick up pt. CM encouraged mother to call Molly MaduroRobert to update him on pt issues and that he and/or mother will be called when pt is ready for d/c.  Mother then states she was going to call Molly MaduroRobert "to have him take pt to his house today" CM informed mother pt will be evaluated and mother will be updated by nursing staff.

## 2014-09-19 NOTE — Progress Notes (Signed)
CSW spoke with CPS worker Kenyatta, who took the information about Pt for APS report. Kenyatta says she will be notifying the APS social worker.  Trish MageBrittney Novie Maggio, LCSWA [phone] ED CSW 09/19/2014 7:59 PM

## 2014-09-19 NOTE — BH Assessment (Addendum)
Assessment Note  Kathy Brown is an 33 y.o. female with history of Schizophrenia and Anxiety. [facility]. Patient called EMS herself requesting for help. Patient stated to EMS staff, "The voices were telling me that someone was coming after me". Patient is afraid of the voices. Sts that she has heard such voices for several months. Patient became afraid of the voices b/c she was home alone. Sts that she lives with her mother, 3 children, and niece. Pt sts that her mother is legal guardian and payee. Patient sts that her mother went to [place] with her children without her b/c she didn't clean her room or have any money to contribute to the trip.  Patient denies SI and HI. Patient denies associated hx. Patient also denies hx of inpatient hospitalizations. Patient admits to mild depression. She is unable to provide details of depressive symptoms. Patient also admits to feeling increasingly anxious since her mother and children left for vacation.  Upon further review patient was in fact [facility]. Patient [facility]. Sts that she is compliant with all medications.   SEE DOCUMENTATION NOTED BY KIMBERLY GIBBS (collateral information) 09/19/2014 5:13PM   Axis I: Schizophrenia, Depressive Disorder NOS, and Anxiety Disorder NOS Axis II: Deferred Axis III:  Past Medical History  Diagnosis Date  . Anxiety   . Asthma   . Schizophrenia   . Chronic back pain    Axis IV: other psychosocial or environmental problems, problems related to social environment, problems with access to health care services and problems with primary support group Axis V: 31-40 impairment in reality testing  Past Medical History:  Past Medical History  Diagnosis Date  . Anxiety   . Asthma   . Schizophrenia   . Chronic back pain     History reviewed. No pertinent past surgical history.  Family History: No family history on file.  Social History:   reports that she has never smoked. She does not have any smokeless tobacco history on file. She reports that she does not drink alcohol or use illicit drugs.  Additional Social History:  Alcohol / Drug Use Pain Medications: SEE MAR Prescriptions: SEE MAR Over the Counter: SEE MAR History of alcohol / drug use?: No history of alcohol / drug abuse  CIWA: CIWA-Ar BP: 137/89 mmHg Pulse Rate: 105 COWS:    Allergies: No Known Allergies  Home Medications:  (Not in a hospital admission)  OB/GYN Status:  Patient's last menstrual period was 08/25/2014.  General Assessment Data [facility] of Assessment: [facility] Is this a Tele or Face-to-Face Assessment?: Face-to-Face Is this an Initial Assessment or a Re-assessment for this encounter?: Initial Assessment Living Arrangements: Other (Comment) (3 children, mom, and niece) Can pt return to current living arrangement?: Yes Admission Status: Voluntary Is patient capable of signing voluntary admission?: Yes Transfer from: Acute Hospital Referral Source: Self/Family/Friend     [facility] Crisis [facility] Living Arrangements: Other (Comment) (3 children, mom, and niece) Name of Psychiatrist:  Museum/gallery curator([facility] ) Name of Therapist:  Museum/gallery curator([facility] )  Education Status Is patient currently in school?: No  Risk to self with the past 6 months Suicidal Ideation: No Suicidal Intent: No Is patient at risk for suicide?: No Suicidal Plan?: No Access to Means: No What has been your use of drugs/alcohol within the last 12 months?:  (n/a) Previous Attempts/Gestures: No How many times?:  (n/a) Other Self Harm Risks:  (n/a) Triggers for Past Attempts: Other (Comment) (patient  reports no previous attempts/gestures ) Intentional Self Injurious Behavior: None Family Suicide History: No Persecutory voices/beliefs?: No Depression: Yes Depression Symptoms: Loss of interest in usual pleasures, Tearfulness, Fatigue, Isolating, Despondent Substance abuse history and/or treatment  for substance abuse?: No Suicide prevention information given to non-admitted patients: Not applicable  Risk to Others within the past 6 months Homicidal Ideation: No Thoughts of Harm to Others: No Current Homicidal Intent: No Current Homicidal Plan: No Access to Homicidal Means: No Identified Victim:  (n/a) History of harm to others?: No Assessment of Violence: None Noted Violent Behavior Description:  (patient calm and cooperative ) Does patient have access to weapons?: No Criminal Charges Pending?: No Does patient have a court date: No  Psychosis Hallucinations: Auditory (Pt sts, "I feel that someone is after me", paranoid, etc. ) Delusions: Unspecified (paranoid that someone is after her)  Mental Status Report Appear/Hygiene: Disheveled Eye Contact: Fair Motor Activity: Freedom of movement Speech: Logical/coherent Level of Consciousness: Alert Mood: Depressed, Anxious, Helpless Affect: Appropriate to circumstance, Frightened Anxiety Level: Minimal Thought Processes: Coherent, Relevant Judgement: Unimpaired Orientation: Person, Time, Situation, Place Obsessive Compulsive Thoughts/Behaviors: None  Cognitive Functioning Concentration: Decreased Memory: Recent Impaired, Remote Impaired IQ: Average Insight: Poor Impulse Control: Fair Appetite: Poor (pt has not eaten any food since mom left home yesterday) Weight Loss:  (unk) Weight Gain:  (unk) Sleep: Decreased (pt reports lack of sleep since mom left home yesterday) Total Hours of Sleep:  (varies ) Vegetative Symptoms: None  ADLScreening [facility] Assessment Services) Patient's cognitive ability adequate to safely complete daily activities?: Yes Patient able to express need for assistance with ADLs?: Yes Independently performs ADLs?: Yes (appropriate for developmental age)  Prior Inpatient Therapy Prior Inpatient Therapy: No (Pt denies; ED [facility]) Prior Therapy Dates:  (n/a) Prior  Therapy Facilty/Provider(s):  (n/a) Reason for Treatment:  (n/a)  Prior Outpatient Therapy Prior Outpatient Therapy: Yes Prior Therapy Dates:  (current) Prior Therapy Facilty/Provider(s):  Museum/gallery curator([facility] ) Reason for Treatment:  (medication managment )  ADL Screening (condition at time of admission) Patient's cognitive ability adequate to safely complete daily activities?: Yes Is the patient deaf or have difficulty hearing?: No Does the patient have difficulty seeing, even when wearing glasses/contacts?: No Does the patient have difficulty concentrating, remembering, or making decisions?: Yes Patient able to express need for assistance with ADLs?: Yes Does the patient have difficulty dressing or bathing?: No Independently performs ADLs?: Yes (appropriate for developmental age) Does the patient have difficulty walking or climbing stairs?: No Weakness of Legs: None Weakness of Arms/Hands: None  Home Assistive Devices/Equipment Home Assistive Devices/Equipment: None    Abuse/Neglect Assessment (Assessment to be complete while patient is alone) Physical Abuse: Denies Verbal Abuse: Denies Sexual Abuse: Denies Exploitation of patient/patient's resources: Denies Self-Neglect: Denies Values / Beliefs Cultural Requests During Hospitalization: None Spiritual Requests During Hospitalization: None   Advance Directives (For Healthcare) Does patient have an advance directive?: Yes Nutrition Screen- [facility] Adult/[facility] Patient's home diet: Regular  Additional Information 1:1 In Past 12 Months?: No CIRT Risk: No Elopement Risk: No Does patient have medical clearance?: Yes     Disposition:  Disposition Initial Assessment Completed for this Encounter: Yes Disposition of Patient: Other dispositions (Per Shuvon Rankin, NP evaluate overnight & re-assess in am) Other disposition(s): Information only (Observe in the ED overnight; re-assess in the am; SW F/U)  On Site Evaluation by:   Reviewed  with Physician:    Melynda Rippleerry, Ariela [facility]izuki [facility] 09/19/2014 6:19 PM

## 2014-09-19 NOTE — Progress Notes (Addendum)
1520 called [place] to confirm with [name] at office that pt had not been since august 2014  1509 cm dialed  [name],[name] Mother [phone]  And left a voice message for her. ED CM consulted by ED charge RN about [facility] after being left at home per pt by family.  ED CM spoke with pt who states that she does not have a father, her mother left her at home, her brother is in [place].  She states she called the EMS but does not know why Pt noted to be smiling at intervals during CM interaction with her especially when she said she called EMS. Pt with 4 ED visits & 1 admission in the last 6 months.  Pt able to tell Cm her mother name is "tammy frazier" and her brother is "[name]"  Pt had a cell phone with her that is not charged. CM encouraged her to get her charger out her bag and plug it in to charge.  Pt got her wallet out and states she has an old license.  CM observed the license with a different address listed. Pt states it is her old address.  Pt states she does not know what a group home is.

## 2014-09-19 NOTE — ED Notes (Signed)
Patient has a urinal and will call when he can go

## 2014-09-19 NOTE — Progress Notes (Signed)
CSW called APS after hours number. Representative took CSW's Cell number, and said she would inform the APS social worker to call back.  CSW will finish the report for Pt once she receives the call back.   Trish MageBrittney Tinia Oravec, LCSWA [phone] ED CSW 09/19/2014 7:23 PM

## 2014-09-19 NOTE — Progress Notes (Signed)
Patient to be seen by TTS per EDRN.

## 2014-09-19 NOTE — ED Notes (Signed)
Pt gave me permission to go thru her cell phone and retrieve any family members numbers including her mothers so that I could try and get in touch with them. After doing so I was able to get in touch with her mother, Kathy Brown, who admitted that she had left the pt here in [place] in the home that they share to go to [place]. The pts mother stated that she took the pts 3 children and her keys and left her at home alone because the pt said she did not want to go with the rest of the family. Pt states that she is scared being at home alone and is having hallucinations although when questioned about her hallucinations and asked to describe them she states "I don"t know. Denies SI/HI at this time. Pt placed in paper scrubs and her one bag of personal belongings were locked in locker 29. Will continue to monitor.

## 2014-09-19 NOTE — ED Provider Notes (Addendum)
CSN: [id number]     Arrival date & time 09/19/14  [id number] History   First MD Initiated Contact with Patient 09/19/14 0755     Chief Complaint  Patient presents with  . Hallucinations     (Consider location/radiation/quality/duration/timing/severity/associated sxs/prior Treatment) HPI Comments: Patient presents to the ER stating that she cannot sleep. Patient comes to the ER by ambulance. She apparently called EMS herself. Patient is somewhat disorganized, not able to answer questions appropriately. She tells me that she normally lives with her mother, but her mother has gone to [place]. She is by herself. She reports that she has not been able to sleep during the day or night for very long time. She has been hearing voices, but will not tell me what they say. She denies active homicidality or suicidality currently.   Past Medical History  Diagnosis Date  . Anxiety   . Asthma   . Schizophrenia   . Chronic back pain    History reviewed. No pertinent past surgical history. No family history on file. History  Substance Use Topics  . Smoking status: Never Smoker   . Smokeless tobacco: Not on file  . Alcohol Use: No   OB History    No data available     Review of Systems  Psychiatric/Behavioral: Positive for hallucinations and sleep disturbance.  All other systems reviewed and are negative.     Allergies  Review of patient's allergies indicates no known allergies.  Home Medications   Prior to Admission medications   Medication Sig Start Date End Date Taking? Authorizing Provider  haloperidol (HALDOL) 2 MG tablet Take 2 mg by mouth at bedtime.   Yes Historical Provider, MD  haloperidol decanoate (HALDOL DECANOATE) 100 MG/ML injection Inject 100 mg into the muscle every 30 (thirty) days. 05/08/14  Yes Historical Provider, MD  traZODone (DESYREL) 50 MG tablet Take 25 mg by mouth at bedtime as needed for sleep.    Yes Historical Provider, MD  HYDROcodone-acetaminophen  (NORCO/VICODIN) 5-325 MG per tablet Take 1 tablet by mouth every 6 (six) hours as needed for moderate pain. Patient not taking: Reported on 09/19/2014 07/19/14   Jamesetta Orleanshristopher W Lawyer, PA-C  ibuprofen (ADVIL,MOTRIN) 800 MG tablet Take 1 tablet (800 mg total) by mouth every 8 (eight) hours as needed. Patient not taking: Reported on 09/19/2014 07/19/14   Jamesetta Orleanshristopher W Lawyer, PA-C   BP 147/76 mmHg  Pulse 109  Temp(Src) 98.1 F (36.7 C)  Resp 16  SpO2 98%  LMP 08/25/2014 Physical Exam  Constitutional: She is oriented to person, place, and time. She appears well-developed and well-nourished. No distress.  HENT:  Head: Normocephalic and atraumatic.  Right Ear: Hearing normal.  Left Ear: Hearing normal.  Nose: Nose normal.  Mouth/Throat: Oropharynx is clear and moist and mucous membranes are normal.  Eyes: Conjunctivae and EOM are normal. Pupils are equal, round, and reactive to light.  Neck: Normal range of motion. Neck supple.  Cardiovascular: Regular rhythm, S1 normal and S2 normal.  Exam reveals no gallop and no friction rub.   No murmur heard. Pulmonary/Chest: Effort normal and breath sounds normal. No respiratory distress. She exhibits no tenderness.  Abdominal: Soft. Normal appearance and bowel sounds are normal. There is no hepatosplenomegaly. There is no tenderness. There is no rebound, no guarding, no tenderness at McBurney's point and negative Murphy's sign. No hernia.  Musculoskeletal: Normal range of motion.  Neurological: She is alert and oriented to person, place, and time. She has normal strength. No cranial  nerve deficit or sensory deficit. Coordination normal. GCS eye subscore is 4. GCS verbal subscore is 5. GCS motor subscore is 6.  Skin: Skin is warm, dry and intact. No rash noted. No cyanosis.  Psychiatric: She has a normal mood and affect. Her behavior is normal. Thought content normal. Her speech is delayed.  Nursing note and vitals reviewed.   ED Course  Procedures  (including critical care time) Labs Review Labs Reviewed  CBC WITH DIFFERENTIAL  COMPREHENSIVE METABOLIC PANEL  URINE RAPID DRUG SCREEN (HOSP PERFORMED)  ETHANOL    Imaging Review No results found.   EKG Interpretation None      MDM   Final diagnoses:  None   insomnia Auditory hallucinations  Patient with previous history of schizophrenia presents to the ER complaining of insomnia. Patient reports that she has not been able to sleep for a very long time. She is somewhat disorganized, unable to answer simple questions. She endorses hallucinations, but denies homicidality and suicidality. Social situation is unclear at this time, she reports that she is home alone after her mother has gone out of town. Patient does not appear to be acutely decompensated from a psychiatric standpoint. She is not a danger to herself. I do not feel that she requires psychiatric evaluation at this point, but does not appear capable of taking care of herself if she is in fact on her own currently. Social services consult to evaluate her living situation.   Gilda Creasehristopher J. Pollina, MD 09/19/14 [id number]  Gilda Creasehristopher J. Pollina, MD 09/19/14 [phone]

## 2014-09-19 NOTE — ED Notes (Signed)
Per EMS, initial call for insomnia.  Pt has hx of schizophrenia.  Pt states mom, uncle and three children moved out over night and moved to [place].  Pt wandering and mentation is younger than age.  Pt found in home.  No t.v's in home.  Main furniture remains however no one else in home.  Pt called EMS on home phone.  Social consult needed.   No SI/HI voiced.  Vitals: 140/70, hr 76, cbg 140, resp 16.

## 2014-09-20 ENCOUNTER — Encounter (HOSPITAL_COMMUNITY): Payer: Self-pay | Admitting: Registered Nurse

## 2014-09-20 DIAGNOSIS — F200 Paranoid schizophrenia: Secondary | ICD-10-CM | POA: Insufficient documentation

## 2014-09-20 DIAGNOSIS — F259 Schizoaffective disorder, unspecified: Secondary | ICD-10-CM

## 2014-09-20 LAB — URINALYSIS, ROUTINE W REFLEX MICROSCOPIC
BILIRUBIN URINE: NEGATIVE
Glucose, UA: NEGATIVE mg/dL
HGB URINE DIPSTICK: NEGATIVE
KETONES UR: NEGATIVE mg/dL
Leukocytes, UA: NEGATIVE
NITRITE: NEGATIVE
PROTEIN: NEGATIVE mg/dL
Specific Gravity, Urine: 1.007 (ref 1.005–1.030)
UROBILINOGEN UA: 0.2 mg/dL (ref 0.0–1.0)
pH: 7.5 (ref 5.0–8.0)

## 2014-09-20 MED ORDER — TRAZODONE 25 MG HALF TABLET: 25.0000 mg | ORAL_TABLET | Freq: Every evening | ORAL | Status: DC | PRN

## 2014-09-20 MED ORDER — TRAZODONE HCL 50 MG PO TABS: 25.0000 mg | ORAL_TABLET | Freq: Every evening | ORAL | Status: DC | PRN

## 2014-09-20 MED ORDER — HALOPERIDOL 2 MG PO TABS: 2.0000 mg | ORAL_TABLET | Freq: Every day | ORAL | Status: DC

## 2014-09-20 NOTE — ED Notes (Signed)
Pt d/c with her uncle. All items returned. D/C instructions given and prescriptions given. Pt denies si and hi.

## 2014-09-20 NOTE — ED Notes (Signed)
Pt has been sitting in her room this morning calm waiting on her breakfast and wanting to go home. Pt denies si and hi. She denies hallucinations. When asked about reason for coming to the hospital, she reports that her mother took her three children and went out of town. She said that there was not enough room in the car and she did not have enough money to go. Offered support and encouragement. Safety maintained in the SAPPU.

## 2014-09-20 NOTE — Consult Note (Signed)
[facility] Face-to-Face Psychiatry Consult   Reason for Consult:  Paranoia Referring Physician:  EDP  Kathy Brown is an 33 y.o. female. Total Time spent with patient: 45 minutes  Assessment: AXIS I:  Schizoaffective Disorder AXIS II:  Deferred AXIS III:   Past Medical History  Diagnosis Date  . Anxiety   . Asthma   . Schizophrenia   . Chronic back pain    AXIS IV:  other psychosocial or environmental problems AXIS V:  61-70 mild symptoms  Plan:  No evidence of imminent risk to self or others at present.   Patient does not meet criteria for psychiatric inpatient admission. Supportive therapy provided about ongoing stressors. Discussed crisis plan, support from social network, calling 911, coming to the Emergency Department, and calling Suicide Hotline.  Subjective:   Kathy Brown is a 33 y.o. female patient presents to [facility] via EMS with complaints of paranoia.  HPI:  Patient states that she was left home while her mother went out of town.   Patient states that she is ready to go home because her bed feels better "this bed"  Patient states that she feels better and ready to go home.  "I was scared; thought my medicine won't working. My mama gone out town she won't be back til Monday."  TTS [place] Porch) spoke with the mother to patient and was informed that she would be back in town on Sunday or Monday but patient could stay with her uncle Herbie Baltimore.  Patient uncle states that he will pick patient up around 3:30 and 4:00 PM  Patient denies suicidal/homicidal ideation and psychosis.  Review of Systems  Psychiatric/Behavioral: Negative for depression, suicidal ideas, hallucinations, memory loss and substance abuse. The patient is not nervous/anxious and does not have insomnia.   All other systems reviewed and are negative.  History reviewed. No pertinent family history.  HPI Elements:   Location:  Paranoia. Quality:  home alone. Severity:  afraid of being home alone. Timing:  1  day.  Past Psychiatric History: Past Medical History  Diagnosis Date  . Anxiety   . Asthma   . Schizophrenia   . Chronic back pain     reports that she has never smoked. She does not have any smokeless tobacco history on file. She reports that she does not drink alcohol or use illicit drugs. History reviewed. No pertinent family history. Family History Substance Abuse: No Family Supports: Yes, List: Living Arrangements: Other (Comment) (3 children, mom, and niece) Can pt return to current living arrangement?: Yes Abuse/Neglect [facility]) Physical Abuse: Denies Verbal Abuse: Denies Sexual Abuse: Denies Allergies:  No Known Allergies  ACT Assessment Complete:  Yes:    Educational Status    Risk to Self: Risk to self with the past 6 months Suicidal Ideation: No Suicidal Intent: No Is patient at risk for suicide?: No Suicidal Plan?: No Access to Means: No What has been your use of drugs/alcohol within the last 12 months?:  (n/a) Previous Attempts/Gestures: No How many times?:  (n/a) Other Self Harm Risks:  (n/a) Triggers for Past Attempts: Other (Comment) (patient reports no previous attempts/gestures ) Intentional Self Injurious Behavior: None Family Suicide History: No Persecutory voices/beliefs?: No Depression: Yes Depression Symptoms: Loss of interest in usual pleasures, Tearfulness, Fatigue, Isolating, Despondent Substance abuse history and/or treatment for substance abuse?: No Suicide prevention information given to non-admitted patients: Not applicable  Risk to Others: Risk to Others within the past 6 months Homicidal Ideation: No Thoughts of Harm to Others: No Current  Homicidal Intent: No Current Homicidal Plan: No Access to Homicidal Means: No Identified Victim:  (n/a) History of harm to others?: No Assessment of Violence: None Noted Violent Behavior Description:  (patient calm and cooperative ) Does patient have access to weapons?: No Criminal Charges Pending?:  No Does patient have a court date: No  Abuse: Abuse/Neglect Assessment (Assessment to be complete while patient is alone) Physical Abuse: Denies Verbal Abuse: Denies Sexual Abuse: Denies Exploitation of patient/patient's resources: Denies Self-Neglect: Denies  Prior Inpatient Therapy: Prior Inpatient Therapy Prior Inpatient Therapy: No (Pt denies; ED notes indicate that pt was at [facility] 03/15/14) Prior Therapy Dates:  (n/a) Prior Therapy Facilty/Provider(s):  (n/a) Reason for Treatment:  (n/a)  Prior Outpatient Therapy: Prior Outpatient Therapy Prior Outpatient Therapy: Yes Prior Therapy Dates:  (current) Prior Therapy Facilty/Provider(s):  Consulting civil engineer ) Reason for Treatment:  (medication managment )  Additional Information: Additional Information 1:1 In Past 12 Months?: No CIRT Risk: No Elopement Risk: No Does patient have medical clearance?: Yes        Objective: Blood pressure 108/63, pulse 99, temperature 98 F (36.7 C), temperature source Oral, resp. rate 16, last menstrual period 08/25/2014, SpO2 100 %.There is no weight on file to calculate BMI. Results for orders placed or performed during the hospital encounter of 09/19/14 (from the past 72 hour(s))  POC urine preg, ED (not at [facility])     Status: None   Collection Time: 09/19/14  8:43 AM  Result Value Ref Range   Preg Test, Ur NEGATIVE NEGATIVE    Comment:        THE SENSITIVITY OF THIS METHODOLOGY IS >24 mIU/mL   CBC WITH DIFFERENTIAL     Status: None   Collection Time: 09/19/14  8:50 AM  Result Value Ref Range   WBC 6.4 4.0 - 10.5 K/uL   RBC 3.97 3.87 - 5.11 MIL/uL   Hemoglobin 12.0 12.0 - 15.0 g/dL   HCT 36.2 36.0 - 46.0 %   MCV 91.2 78.0 - 100.0 fL   MCH 30.2 26.0 - 34.0 pg   MCHC 33.1 30.0 - 36.0 g/dL   RDW 13.2 11.5 - 15.5 %   Platelets 226 150 - 400 K/uL   Neutrophils Relative % 71 43 - 77 %   Neutro Abs 4.6 1.7 - 7.7 K/uL   Lymphocytes Relative 20 12 - 46 %   Lymphs Abs 1.3 0.7 - 4.0 K/uL   Monocytes  Relative 7 3 - 12 %   Monocytes Absolute 0.4 0.1 - 1.0 K/uL   Eosinophils Relative 1 0 - 5 %   Eosinophils Absolute 0.1 0.0 - 0.7 K/uL   Basophils Relative 1 0 - 1 %   Basophils Absolute 0.0 0.0 - 0.1 K/uL  Comprehensive metabolic panel     Status: Abnormal   Collection Time: 09/19/14  8:50 AM  Result Value Ref Range   Sodium 138 137 - 147 mEq/L   Potassium 3.9 3.7 - 5.3 mEq/L   Chloride 100 96 - 112 mEq/L   CO2 25 19 - 32 mEq/L   Glucose, Bld 102 (H) 70 - 99 mg/dL   BUN 7 6 - 23 mg/dL   Creatinine, Ser 0.76 0.50 - 1.10 mg/dL   Calcium 9.7 8.4 - 10.5 mg/dL   Total Protein 7.7 6.0 - 8.3 g/dL   Albumin 3.8 3.5 - 5.2 g/dL   AST 13 0 - 37 U/L   ALT 9 0 - 35 U/L   Alkaline Phosphatase 61 39 - 117  U/L   Total Bilirubin 0.3 0.3 - 1.2 mg/dL   GFR calc non Af Amer >90 >90 mL/min   GFR calc Af Amer >90 >90 mL/min    Comment: (NOTE) The eGFR has been calculated using the CKD EPI equation. This calculation has not been validated in all clinical situations. eGFR's persistently <90 mL/min signify possible Chronic Kidney Disease.    Anion gap 13 5 - 15  Ethanol     Status: None   Collection Time: 09/19/14  8:50 AM  Result Value Ref Range   Alcohol, Ethyl (B) <11 0 - 11 mg/dL    Comment:        LOWEST DETECTABLE LIMIT FOR SERUM ALCOHOL IS 11 mg/dL FOR MEDICAL PURPOSES ONLY   Drug screen panel, emergency     Status: None   Collection Time: 09/19/14  9:01 AM  Result Value Ref Range   Opiates NONE DETECTED NONE DETECTED   Cocaine NONE DETECTED NONE DETECTED   Benzodiazepines NONE DETECTED NONE DETECTED   Amphetamines NONE DETECTED NONE DETECTED   Tetrahydrocannabinol NONE DETECTED NONE DETECTED   Barbiturates NONE DETECTED NONE DETECTED    Comment:        DRUG SCREEN FOR MEDICAL PURPOSES ONLY.  IF CONFIRMATION IS NEEDED FOR ANY PURPOSE, NOTIFY LAB WITHIN 5 DAYS.        LOWEST DETECTABLE LIMITS FOR URINE DRUG SCREEN Drug Class       Cutoff (ng/mL) Amphetamine       1000 Barbiturate      200 Benzodiazepine   076 Tricyclics       808 Opiates          300 Cocaine          300 THC              50   Urinalysis, Routine w reflex microscopic     Status: Abnormal   Collection Time: 09/20/14 11:35 AM  Result Value Ref Range   Color, Urine YELLOW YELLOW   APPearance CLOUDY (A) CLEAR   Specific Gravity, Urine 1.007 1.005 - 1.030   pH 7.5 5.0 - 8.0   Glucose, UA NEGATIVE NEGATIVE mg/dL   Hgb urine dipstick NEGATIVE NEGATIVE   Bilirubin Urine NEGATIVE NEGATIVE   Ketones, ur NEGATIVE NEGATIVE mg/dL   Protein, ur NEGATIVE NEGATIVE mg/dL   Urobilinogen, UA 0.2 0.0 - 1.0 mg/dL   Nitrite NEGATIVE NEGATIVE   Leukocytes, UA NEGATIVE NEGATIVE    Comment: MICROSCOPIC NOT DONE ON URINES WITH NEGATIVE PROTEIN, BLOOD, LEUKOCYTES, NITRITE, OR GLUCOSE <1000 mg/dL.   Labs are reviewed see values above.  Medications reviewed and no changes made  No current facility-administered medications for this encounter.   Current Outpatient Prescriptions  Medication Sig Dispense Refill  . haloperidol (HALDOL) 2 MG tablet Take 2 mg by mouth at bedtime.    . haloperidol decanoate (HALDOL DECANOATE) 100 MG/ML injection Inject 100 mg into the muscle every 30 (thirty) days.    . traZODone (DESYREL) 50 MG tablet Take 25 mg by mouth at bedtime as needed for sleep.     Marland Kitchen HYDROcodone-acetaminophen (NORCO/VICODIN) 5-325 MG per tablet Take 1 tablet by mouth every 6 (six) hours as needed for moderate pain. (Patient not taking: Reported on 09/19/2014) 15 tablet 0  . ibuprofen (ADVIL,MOTRIN) 800 MG tablet Take 1 tablet (800 mg total) by mouth every 8 (eight) hours as needed. (Patient not taking: Reported on 09/19/2014) 21 tablet 0    Psychiatric Specialty Exam:  Blood pressure 108/63, pulse 99, temperature 98 F (36.7 C), temperature source Oral, resp. rate 16, last menstrual period 08/25/2014, SpO2 100 %.There is no weight on file to calculate BMI.  General Appearance: Casual  Eye  Contact::  Good  Speech:  Clear and Coherent and Normal Rate  Volume:  Normal  Mood:  Anxious  Affect:  Congruent  Thought Process:  Circumstantial  Orientation:  Full (Time, Place, and Person)  Thought Content:  Rumination  Suicidal Thoughts:  No  Homicidal Thoughts:  No  Memory:  Immediate;   Good Recent;   Good Remote;   Fair  Judgement:  Fair  Insight:  Fair  Psychomotor Activity:  Normal  Concentration:  Fair  Recall:  Good  Fund of [place]  Language: Fair  Akathisia:  No  Handed:  Right  AIMS (if indicated):     Assets:  Communication Skills Desire for Improvement  Sleep:      Musculoskeletal: Strength & Muscle Tone: within normal limits Gait & Station: normal Patient leans: N/A  Treatment Plan Summary: Discharge home.  Patient uncle Herbie [place]) is to pick patient up.  RX for one week of medication  Earleen Newport, FNP-BC 09/20/2014 2:44 PM  Patient seen, evaluated and I agree with notes by Nurse Practitioner. Corena Pilgrim, MD

## 2014-09-20 NOTE — Discharge Instructions (Signed)
Paranoia °Paranoia is a distrust of others that is not based on a real reason for distrust. This may reach delusional levels. This means the paranoid person feels the world is against them when there is no reason to make them feel that way. People with paranoia feel as though people around them are "out to get them".  °SIMILAR MENTAL ILNESSES °· Depression is a feeling as though you are down all the time. It is normal in some situations where you have just lost a loved one. It is abnormal if you are having feelings of paranoia with it. °· Dementia is a physical problem with the brain in which the brain no longer works properly. There are problems with daily activities of living. Alzheimer's disease is one example of this. Dementia is also caused by old age changes in the brain which come with the death of brain cells and small strokes. °· Paranoidschizophrenia. People with paranoid schizophrenia and persecutory delusional disorder have delusions in which they feel people around them are plotting against them. Persecutory delusions in paranoid schizophrenia are bizarre, sometimes grandiose, and often accompanied by auditory hallucinations. This means the person is hearing voices that are not there. °· Delusionaldisorder (persecutory type). Delusions experienced by individuals with delusional disorder are more believable than those experienced by paranoid schizophrenics; they are not bizarre, though still unjustified. Individuals with delusional disorder may seem offbeat or quirky rather than mentally ill, and therefore, may never seek treatment. °All of these problems usually do not allow these people to interact socially in an acceptable manner. °CAUSES °The cause of paranoia is often not known. It is common in people with extended abuse of: °· Cocaine. °· Amphetamine. °· Marijuana. °· Alcohol. °Sometimes there is an inherited tendency. It may be associated with stress or changes in brain chemistry. °DIAGNOSIS    °When paranoia is present, your caregiver may: °· Refer you to a specialist. °· Do a physical exam. °· Perform other tests on you to make sure there are not other problems causing the paranoia including: °¨ Physical problems. °¨ Mental problems. °¨ Chemical problems (other than drugs). °Testing may be done to determine if there is a psychiatric disability present that can be treated with medicine. °TREATMENT  °· Paranoia that is a symptom of a psychiatric problem should be treated by professionals. °· Medicines are available which can help this disorder. Antipsychotic medicine may be prescribed by your caregiver. °· Sometimes psychotherapy may be useful. °· Conditions such as depression or drug abuse are treated individually. If the paranoia is caused by drug abuse, a treatment facility may be helpful. Depression may be helped by antidepressants. °PROGNOSIS  °· Paranoid people are difficult to treat because of their belief that everyone is out to get them or harm them. Because of this mistrust, they often must be talked into entering treatment by a trusted family member or friend. They may not want to take medicine as they may see this as an attempt to poison them. °· Gradual gains in the trust of a therapist or caregiver helps in a successful treatment plan. °· Some people with PPD or persecutory delusional disorder function in society without treatment in limited fashion. °Document Released: 10/16/2003 Document Revised: 01/05/2012 Document Reviewed: 06/20/2008 °ExitCare® Patient Information ©2015 [employer]. This information is not intended to replace advice given to you by your health care provider. Make sure you discuss any questions you have with your health care provider. ° °

## 2014-09-20 NOTE — BH Assessment (Signed)
D/C home per Dr. Jannifer FranklinAkintayo and Assunta FoundShuvon Rankin, NP. Patient lives with mom and her 3 children. Writer under the impression that mom may be legal guardian. Writer contacted mom who is out of town until Saturday or Sunday. Writer clarified with mom that she is not patient's legal guardian. Sts that she doesn't have any legal papers indicating that she has guardianship. Mom does however admit that she is the payee. Writer asked mom if patient could go home with a family member. Mom suggested that patient may be able to go home with uncle, Molly MaduroRobert [phone]. Writer contacted patient's uncle and he is agreeable to pick patient up from the ED between 3pm and 4pm.

## 2014-09-20 NOTE — BHH Suicide Risk Assessment (Cosign Needed)
Suicide Risk Assessment  Discharge Assessment     Demographic Factors:  Female  Total Time spent with patient: 30 minutes  Psychiatric Specialty Exam:     Blood pressure 108/63, pulse 99, temperature 98 F (36.7 C), temperature source Oral, resp. rate 16, last menstrual period 08/25/2014, SpO2 100 %.There is no weight on file to calculate BMI.  General Appearance: Casual  Eye Contact:: Good  Speech: Clear and Coherent and Normal Rate  Volume: Normal  Mood: Anxious  Affect: Congruent  Thought Process: Circumstantial  Orientation: Full (Time, Place, and Person)  Thought Content: Rumination  Suicidal Thoughts: No  Homicidal Thoughts: No  Memory: Immediate; Good Recent; Good Remote; Fair  Judgement: Fair  Insight: Fair  Psychomotor Activity: Normal  Concentration: Fair  Recall: Good  Fund of Knowledge:Fair  Language: Fair  Akathisia: No  Handed: Right  AIMS (if indicated):    Assets: Communication Skills Desire for Improvement  Sleep:     Musculoskeletal: Strength & Muscle Tone: within normal limits Gait & Station: normal Patient leans: N/A   Mental Status Per Nursing Assessment::   On Admission:     Current Mental Status by Physician: denies suicidal/homicidal ideation and psychosis  Loss Factors: NA  Historical Factors: NA  Risk Reduction Factors:   Sense of responsibility to family and Living with another person, especially a relative  Continued Clinical Symptoms:  Previous Psychiatric Diagnoses and Treatments  Cognitive Features That Contribute To Risk:  Loss of executive function    Suicide Risk:  Minimal: No identifiable suicidal ideation.  Patients presenting with no risk factors but with morbid ruminations; may be classified as minimal risk based on the severity of the depressive symptoms  Discharge Diagnoses:  AXIS I: Schizoaffective Disorder AXIS II: Deferred AXIS III:  Past  Medical History  Diagnosis Date  . Anxiety   . Asthma   . Schizophrenia   . Chronic back pain    AXIS IV: other psychosocial or environmental problems AXIS V: 61-70 mild symptoms  Plan Of Care/Follow-up recommendations:  Activity:  as tolerated Diet:  as tolerated  Is patient on multiple antipsychotic therapies at discharge:  No   Has Patient had three or more failed trials of antipsychotic monotherapy by history:  No  Recommended Plan for Multiple Antipsychotic Therapies: NA    Kinberly Perris, FNP-BC 09/20/2014, 3:15 PM

## 2014-11-02 DIAGNOSIS — F209 Schizophrenia, unspecified: Secondary | ICD-10-CM | POA: Diagnosis not present

## 2014-12-05 DIAGNOSIS — F209 Schizophrenia, unspecified: Secondary | ICD-10-CM | POA: Diagnosis not present

## 2015-01-11 DIAGNOSIS — F70 Mild intellectual disabilities: Secondary | ICD-10-CM | POA: Diagnosis not present

## 2015-01-11 DIAGNOSIS — F250 Schizoaffective disorder, bipolar type: Secondary | ICD-10-CM | POA: Diagnosis not present

## 2015-01-16 DIAGNOSIS — F70 Mild intellectual disabilities: Secondary | ICD-10-CM | POA: Diagnosis not present

## 2015-01-16 DIAGNOSIS — F250 Schizoaffective disorder, bipolar type: Secondary | ICD-10-CM | POA: Diagnosis not present

## 2015-02-15 DIAGNOSIS — F209 Schizophrenia, unspecified: Secondary | ICD-10-CM | POA: Diagnosis not present

## 2015-03-15 DIAGNOSIS — F209 Schizophrenia, unspecified: Secondary | ICD-10-CM | POA: Diagnosis not present

## 2015-03-19 DIAGNOSIS — F209 Schizophrenia, unspecified: Secondary | ICD-10-CM | POA: Diagnosis not present

## 2015-04-19 DIAGNOSIS — F209 Schizophrenia, unspecified: Secondary | ICD-10-CM | POA: Diagnosis not present

## 2015-04-25 ENCOUNTER — Emergency Department (HOSPITAL_COMMUNITY)
Admission: EM | Admit: 2015-04-25 | Discharge: 2015-04-25 | Disposition: A | Discharge status: Home / Self Care | Payer: Medicare Other | Attending: Emergency Medicine | Admitting: Emergency Medicine

## 2015-04-25 ENCOUNTER — Encounter (HOSPITAL_COMMUNITY): Payer: Self-pay

## 2015-04-25 DIAGNOSIS — Z008 Encounter for other general examination: Secondary | ICD-10-CM | POA: Diagnosis present

## 2015-04-25 DIAGNOSIS — G8929 Other chronic pain: Secondary | ICD-10-CM | POA: Diagnosis not present

## 2015-04-25 DIAGNOSIS — G4700 Insomnia, unspecified: Secondary | ICD-10-CM | POA: Insufficient documentation

## 2015-04-25 DIAGNOSIS — F419 Anxiety disorder, unspecified: Secondary | ICD-10-CM | POA: Insufficient documentation

## 2015-04-25 DIAGNOSIS — Z79899 Other long term (current) drug therapy: Secondary | ICD-10-CM | POA: Diagnosis not present

## 2015-04-25 DIAGNOSIS — F200 Paranoid schizophrenia: Secondary | ICD-10-CM | POA: Diagnosis not present

## 2015-04-25 DIAGNOSIS — J45909 Unspecified asthma, uncomplicated: Secondary | ICD-10-CM | POA: Diagnosis not present

## 2015-04-25 MED ORDER — TRAZODONE HCL 50 MG PO TABS: 25.0000 mg | ORAL_TABLET | Freq: Every evening | ORAL | Status: DC | PRN

## 2015-04-25 NOTE — ED Notes (Signed)
Mother (Mrs. Emelda FearFerguson) in [place] [phone].

## 2015-04-25 NOTE — ED Notes (Signed)
Bed: [id number] Expected date:  Expected time:  Means of arrival:  Comments: EMS- 30s, psych, out of meds

## 2015-04-25 NOTE — ED Provider Notes (Signed)
CSN: [id number]     Arrival date & time 04/25/15  0908 History   First MD Initiated Contact with Patient 04/25/15 [phone]     Chief Complaint  Patient presents with  . Medical Clearance     (Consider location/radiation/quality/duration/timing/severity/associated sxs/prior Treatment) HPI Comments: Patient reports she thinks her grandmother came into her house relate last night when she got up this morning.  All of her family including her mother and 3 kids were gone to [place].  Per EMS, she left for [place] on the 28th and will be returning tomorrow.  Patient denied SI or HI to EMS.  She has been out of her trazodone about 1 week  Patient is a 34 y.o. female presenting with mental health disorder.  Mental Health Problem Presenting symptoms: delusional and paranoid behavior   Presenting symptoms: no agitation   Patient accompanied by: EMS. Degree of incapacity (severity):  Moderate Duration:  1 day Timing:  Constant Progression:  Unchanged Chronicity:  New Context: noncompliance   Treatment compliance:  Most of the time Time since last psychoactive medication taken:  1 week Relieved by:  Nothing Worsened by:  Nothing tried Ineffective treatments:  None tried Associated symptoms: no abdominal pain, no appetite change, no chest pain, no fatigue and no headaches     Past Medical History  Diagnosis Date  . Anxiety   . Asthma   . Schizophrenia   . Chronic back pain    No past surgical history on file. No family history on file. History  Substance Use Topics  . Smoking status: Never Smoker   . Smokeless tobacco: Not on file  . Alcohol Use: No   OB History    No data available     Review of Systems  Constitutional: Negative for fever, chills, diaphoresis, activity change, appetite change and fatigue.  HENT: Negative for congestion, facial swelling, rhinorrhea and sore throat.   Eyes: Negative for photophobia and discharge.  Respiratory: Negative for cough, chest  tightness and shortness of breath.   Cardiovascular: Negative for chest pain, palpitations and leg swelling.  Gastrointestinal: Negative for nausea, vomiting, abdominal pain and diarrhea.  Endocrine: Negative for polydipsia and polyuria.  Genitourinary: Negative for dysuria, frequency, difficulty urinating and pelvic pain.  Musculoskeletal: Negative for back pain, arthralgias, neck pain and neck stiffness.  Skin: Negative for color change and wound.  Allergic/Immunologic: Negative for immunocompromised state.  Neurological: Negative for facial asymmetry, weakness, numbness and headaches.  Hematological: Does not bruise/bleed easily.  Psychiatric/Behavioral: Positive for paranoia. Negative for confusion and agitation.      Allergies  Review of patient's allergies indicates no known allergies.  Home Medications   Prior to Admission medications   Medication Sig Start Date End Date Taking? Authorizing Provider  benztropine (COGENTIN) 0.5 MG tablet Take 0.5 mg by mouth 2 (two) times daily.   Yes Historical Provider, MD  haloperidol decanoate (HALDOL DECANOATE) 100 MG/ML injection Inject 100 mg into the muscle every 30 (thirty) days. 05/08/14  Yes Historical Provider, MD  haloperidol (HALDOL) 2 MG tablet Take 1 tablet (2 mg total) by mouth at bedtime. Patient not taking: Reported on 04/25/2015 09/20/14   Shuvon B Rankin, NP  traZODone (DESYREL) 50 MG tablet Take 0.5 tablets (25 mg total) by mouth at bedtime as needed for sleep. 04/25/15   Toy CookeyMegan Docherty, MD   BP 120/59 mmHg  Pulse 82  Temp(Src) 98.1 F (36.7 C) (Oral)  Resp 18  SpO2 99%  LMP  Physical Exam  Constitutional: She is oriented to person, place, and time. She appears well-developed and well-nourished. No distress.  HENT:  Head: Normocephalic and atraumatic.  Mouth/Throat: No oropharyngeal exudate.  Eyes: Pupils are equal, round, and reactive to light.  Neck: Normal range of motion. Neck supple.  Cardiovascular: Normal  rate, regular rhythm and normal heart sounds.  Exam reveals no gallop and no friction rub.   No murmur heard. Pulmonary/Chest: Effort normal and breath sounds normal. No respiratory distress. She has no wheezes. She has no rales.  Abdominal: Soft. Bowel sounds are normal. She exhibits no distension and no mass. There is no tenderness. There is no rebound and no guarding.  Musculoskeletal: Normal range of motion. She exhibits no edema or tenderness.  Neurological: She is alert and oriented to person, place, and time.  Skin: Skin is warm and dry.  Psychiatric: She has a normal mood and affect. Her speech is normal and behavior is normal. Thought content is paranoid.    ED Course  Procedures (including critical care time) Labs Review Labs Reviewed - No data to display  Imaging Review No results found.   EKG Interpretation None      MDM   Final diagnoses:  Paranoid schizophrenia  Insomnia    Pt is a 34 y.o. female with Pmhx as above including paranoid schizophrenia who presents with report on insomnia of 1 day, which was her chief complaint to EMS. Per EMS, pt' mother is out of town and she also reported she has been out of her trazodone for 3 days. She denied SI/HI to EMS. In chart review, pt has hx of paranoid schizophrenia and has had several visits for paranoia, insomnia when mother has been out of town in the past.  Per EMS report mother will be returning tomorrow.  Patient reported that she did not feel safe at home to nursing, although denies SI or HI to me.  I believe these delusions that someone is breaking into her house to be chronic.  She does not appear acutely psychotic.  I've attempted to call her mother, at [phone], the left a message but received no return call.  I will refill her trazodone.  I've asked her to contact her grandmother to come stay with her.      [name] evaluation in the Emergency Department is complete. It has been determined that no acute  conditions requiring further emergency intervention are present at this time. The patient/guardian have been advised of the diagnosis and plan. We have discussed signs and symptoms that warrant return to the ED, such as changes or worsening in symptoms, SI/HI      Toy Cookey, MD 04/27/15 1121

## 2015-04-25 NOTE — ED Notes (Signed)
She remains in no distress.  Our social worker has engaged in numerous phone calls to attempt to appropriately  Send pt. Home (we had to find where home is).

## 2015-04-25 NOTE — ED Notes (Signed)
She remains in no distress and ambulates occasionally without difficulty to our b.r.  We are awaiting social work to issue taxi voucher.

## 2015-04-25 NOTE — ED Notes (Signed)
She called EMS to report she was "unable to sleep" last night, as she is out of her Trazadone.  When asked if she feels safe at home or if she feels like hurting herself, she tell us she would not feel safe and that she might hurt herself--although she expresses no plan.

## 2015-04-25 NOTE — Discharge Instructions (Signed)
Insomnia Insomnia is frequent trouble falling and/or staying asleep. Insomnia can be a long term problem or a short term problem. Both are common. Insomnia can be a short term problem when the wakefulness is related to a certain stress or worry. Long term insomnia is often related to ongoing stress during waking hours and/or poor sleeping habits. Overtime, sleep deprivation itself can make the problem worse. Every little thing feels more severe because you are overtired and your ability to cope is decreased. CAUSES   Stress, anxiety, and depression.  Poor sleeping habits.  Distractions such as TV in the bedroom.  Naps close to bedtime.  Engaging in emotionally charged conversations before bed.  Technical reading before sleep.  Alcohol and other sedatives. They may make the problem worse. They can hurt normal sleep patterns and normal dream activity.  Stimulants such as caffeine for several hours prior to bedtime.  Pain syndromes and shortness of breath can cause insomnia.  Exercise late at night.  Changing time zones may cause sleeping problems (jet lag). It is sometimes helpful to have someone observe your sleeping patterns. They should look for periods of not breathing during the night (sleep apnea). They should also look to see how long those periods last. If you live alone or observers are uncertain, you can also be observed at a sleep clinic where your sleep patterns will be professionally monitored. Sleep apnea requires a checkup and treatment. Give your caregivers your medical history. Give your caregivers observations your family has made about your sleep.  SYMPTOMS   Not feeling rested in the morning.  Anxiety and restlessness at bedtime.  Difficulty falling and staying asleep. TREATMENT   Your caregiver may prescribe treatment for an underlying medical disorders. Your caregiver can give advice or help if you are using alcohol or other drugs for self-medication. Treatment  of underlying problems will usually eliminate insomnia problems.  Medications can be prescribed for short time use. They are generally not recommended for lengthy use.  Over-the-counter sleep medicines are not recommended for lengthy use. They can be habit forming.  You can promote easier sleeping by making lifestyle changes such as:  Using relaxation techniques that help with breathing and reduce muscle tension.  Exercising earlier in the day.  Changing your diet and the time of your last meal. No night time snacks.  Establish a regular time to go to bed.  Counseling can help with stressful problems and worry.  Soothing music and white noise may be helpful if there are background noises you cannot remove.  Stop tedious detailed work at least one hour before bedtime. HOME CARE INSTRUCTIONS   Keep a diary. Inform your caregiver about your progress. This includes any medication side effects. See your caregiver regularly. Take note of:  Times when you are asleep.  Times when you are awake during the night.  The quality of your sleep.  How you feel the next day. This information will help your caregiver care for you.  Get out of bed if you are still awake after 15 minutes. Read or do some quiet activity. Keep the lights down. Wait until you feel sleepy and go back to bed.  Keep regular sleeping and waking hours. Avoid naps.  Exercise regularly.  Avoid distractions at bedtime. Distractions include watching television or engaging in any intense or detailed activity like attempting to balance the household checkbook.  Develop a bedtime ritual. Keep a familiar routine of bathing, brushing your teeth, climbing into bed at the same  time each night, listening to soothing music. Routines increase the success of falling to sleep faster.  Use relaxation techniques. This can be using breathing and muscle tension release routines. It can also include visualizing peaceful scenes. You can  also help control troubling or intruding thoughts by keeping your mind occupied with boring or repetitive thoughts like the old concept of counting sheep. You can make it more creative like imagining planting one beautiful flower after another in your backyard garden.  During your day, work to eliminate stress. When this is not possible use some of the previous suggestions to help reduce the anxiety that accompanies stressful situations. MAKE SURE YOU:   Understand these instructions.  Will watch your condition.  Will get help right away if you are not doing well or get worse. Document Released: 10/10/2000 Document Revised: 01/05/2012 Document Reviewed: 11/10/2007 [facility] Patient Information 2015 [place]. This information is not intended to replace advice given to you by your health care provider. Make sure you discuss any questions you have with your health care provider.  Paranoia Paranoia is a distrust of others that is not based on a real reason for distrust. This may reach delusional levels. This means the paranoid person feels the world is against them when there is no reason to make them feel that way. People with paranoia feel as though people around them are "out to get them".  SIMILAR MENTAL ILNESSES  Depression is a feeling as though you are down all the time. It is normal in some situations where you have just lost a loved one. It is abnormal if you are having feelings of paranoia with it.  Dementia is a physical problem with the brain in which the brain no longer works properly. There are problems with daily activities of living. Alzheimer's disease is one example of this. Dementia is also caused by old age changes in the brain which come with the death of brain cells and small strokes.  Paranoidschizophrenia. People with paranoid schizophrenia and persecutory delusional disorder have delusions in which they feel people around them are plotting against them. Persecutory  delusions in paranoid schizophrenia are bizarre, sometimes grandiose, and often accompanied by auditory hallucinations. This means the person is hearing voices that are not there.  Delusionaldisorder (persecutory type). Delusions experienced by individuals with delusional disorder are more believable than those experienced by paranoid schizophrenics; they are not bizarre, though still unjustified. Individuals with delusional disorder may seem offbeat or quirky rather than mentally ill, and therefore, may never seek treatment. All of these problems usually do not allow these people to interact socially in an acceptable manner. CAUSES The cause of paranoia is often not known. It is common in people with extended abuse of:  Cocaine.  Amphetamine.  Marijuana.  Alcohol. Sometimes there is an inherited tendency. It may be associated with stress or changes in brain chemistry. DIAGNOSIS  When paranoia is present, your caregiver may:  Refer you to a specialist.  Do a physical exam.  Perform other tests on you to make sure there are not other problems causing the paranoia including:  Physical problems.  Mental problems.  Chemical problems (other than drugs). Testing may be done to determine if there is a psychiatric disability present that can be treated with medicine. TREATMENT   Paranoia that is a symptom of a psychiatric problem should be treated by professionals.  Medicines are available which can help this disorder. Antipsychotic medicine may be prescribed by your caregiver.  Sometimes psychotherapy  may be useful.  Conditions such as depression or drug abuse are treated individually. If the paranoia is caused by drug abuse, a treatment facility may be helpful. Depression may be helped by antidepressants. PROGNOSIS   Paranoid people are difficult to treat because of their belief that everyone is out to get them or harm them. Because of this mistrust, they often must be talked into  entering treatment by a trusted family member or friend. They may not want to take medicine as they may see this as an attempt to poison them.  Gradual gains in the trust of a therapist or caregiver helps in a successful treatment plan.  Some people with PPD or persecutory delusional disorder function in society without treatment in limited fashion. Document Released: 10/16/2003 Document Revised: 01/05/2012 Document Reviewed: 06/20/2008 [facility] Patient Information 2015 [place], [place]. This information is not intended to replace advice given to you by your health care provider. Make sure you discuss any questions you have with your health care provider.

## 2015-04-25 NOTE — ED Notes (Signed)
Pt offered sandwich and ginger ale, currently eating.

## 2015-04-25 NOTE — ED Notes (Signed)
Per Baxter HireKristen CSW, Pt is staying w/ her grandmother while her mother is out of town.  Sts "she wanted to sleep in her own bed last night, so they let her.  She got scared and called EMS w/o letting anyone know.  Her grandmother is at church, but should be able to come and get her after 1pm."

## 2015-04-25 NOTE — Progress Notes (Signed)
CSW received consult for transportation assistance. CSW met with pt who states that she does not know her address but knows she does not live at [address]. Anymore. Pt states that her grandmother helps her to get around and lives at the home, but was not home right now. Pt states that grandmother was probably at church since its Wednesday.  CSW left message for pt mother Mrs. Ferguson at [phone]. CSW also called mother's number in chart [phone] CSW left message for pt grandmother at [phone] (number listed in chart)   CSW reached pt mother, Ms. FRAZIER at [phone] who states that she is out of town in [place] and patient is staying with pt grandmother. Pt mother states that patient was suppose to stay with pt grandomther however pt wanted to stay at her house last night and before pt grandmother knew, pt was on her way to the hospital. Patient mother states that patient should be able to pick up patient after 1pm when church is out. Pt states her mother will not answer the phone while she's in church. CSW confirmed # with pt daughter. Pt address  Is   [address]  [place], [place] [address]   Kathy Brown, [place] Work  [employer] [phone]

## 2015-04-25 NOTE — ED Notes (Signed)
Attempted to track down address pt was picked up from by ems this am by speaking with Communications. Ultimately transferred to shift commander who was unavailable, left message requesting callback.

## 2015-06-14 DIAGNOSIS — F209 Schizophrenia, unspecified: Secondary | ICD-10-CM | POA: Diagnosis not present

## 2015-08-02 DIAGNOSIS — F411 Generalized anxiety disorder: Secondary | ICD-10-CM

## 2015-08-02 DIAGNOSIS — G4700 Insomnia, unspecified: Secondary | ICD-10-CM

## 2015-08-03 NOTE — Congregational Nurse Program (Signed)
CN and CSWEI Loma Messing met with Kathy Brown who states she is concerned about her three children not having rec'd their MCD cards as yet.  Marianna Fuss will follow up with Client.

## 2015-08-30 DIAGNOSIS — Z23 Encounter for immunization: Secondary | ICD-10-CM

## 2015-08-30 NOTE — Patient Instructions (Signed)
Return at 1pm next Thursday to meet with RN and CSWEI Social Worker, Flonnie OvermanK. Brown.

## 2015-08-30 NOTE — Congregational Nurse Program (Signed)
Congregational Nurse Program Note  Date of Encounter: 08/30/2015  Past Medical History: Past Medical History  Diagnosis Date  . Anxiety   . Asthma   . Schizophrenia   . Chronic back pain     Encounter Details:     CNP Questionnaire - 08/30/15 2032    Patient Demographics   Is this a new or existing patient? Existing   Patient is considered a/an Not Applicable   Patient Assistance   Patient's financial/insurance status Medicaid   Patient referred to apply for the following financial assistance Not Applicable   Food insecurities addressed Not Applicable   Transportation assistance Yes  transportation needed to get to medical appts.,   Assistance securing medications Yes  pt. needs assistance securing medications   Educational health offerings Behavioral health;Medications;Navigating the healthcare system;Exercise/physical activity   Encounter Details   Primary purpose of visit Education/Health Concerns;Other (comment)   Was an Emergency Department visit averted? Not Applicable   Does patient have a medical provider? No  unsure of PCP, pt. does not name any specific provider.    Patient referred to Area Agency;Establish PCP;Medication Assistance Programs   Was a mental health screening completed? (GAINS tool) No   Does patient have dental issues? Yes   Was a dental referral made? No   Since previous encounter, have you referred patient for abnormal blood pressure that resulted in a new diagnosis or medication change? No   Since previous encounter, have you referred patient for abnormal blood glucose that resulted in a new diagnosis or medication change? No      BP 115/75 mmHg  Pulse 96 Pt. Denies being anxious,  Flu  Shot given, discussed referral to a mental health provider, pt. Refused saying "not right now."  Transportation needs discussed.  Pt. States "her case worker never calls her back."  Obvious dental caries noted.  Pt. States "she has never been to a dentist."  RN  requested pt. Return next Thursday to discuss needs she has along with the CSWEI, Flonnie OvermanK. Martinez to help with transportation, communication with case mgr., establishing a PCP for follow up care so that hospital visits can be less. Pt. Agrees she will return next week.  Gave RN her mother's cell number.

## 2015-11-15 DIAGNOSIS — Z7189 Other specified counseling: Secondary | ICD-10-CM

## 2015-11-15 NOTE — Congregational Nurse Program (Signed)
Congregational Nurse Program Note  Date of Encounter: 11/15/2015  Past Medical History: Past Medical History  Diagnosis Date  . Anxiety   . Asthma   . Schizophrenia   . Chronic back pain     Encounter Details:     CNP Questionnaire - 11/15/15 1419    Patient Demographics   Is this a new or existing patient? Existing   Patient is considered a/an Not Applicable   Race African-American/Black   Patient Assistance   Location of Patient Assistance [facility]   Patient's financial/insurance status Medicaid   Uninsured Patient No   Patient referred to apply for the following financial assistance Not Applicable   Food insecurities addressed Not Applicable   Transportation assistance No   Assistance securing medications No   Educational health offerings Not Applicable   Encounter Details   Primary purpose of visit Other   Was an Emergency Department visit averted? Not Applicable   Does patient have a medical provider? No   Patient referred to Other (comment)  RN called left message for social worker, K. Bradly Bienenstock to inform her of mother saying she left  her MCD papers in [place].  RN asked pt. to have her mother come to talk wtih RN.  SW not present today   Was a mental health screening completed? (GAINS tool) No   Does patient have dental issues? Yes   Was a dental referral made? --  has no way to get to dentist nor PCP for a referral.   Since previous encounter, have you referred patient for abnormal blood pressure that resulted in a new diagnosis or medication change? No   Since previous encounter, have you referred patient for abnormal blood glucose that resulted in a new diagnosis or medication change? No       Pt. Here today. She is mentally challenged and sometimes it is hard to understand her reasonings or requests.  RN understands her to say her children "don't have their MCD"  And she left all her MCD papers in [place] over Christmas when they visited.  She  says she can't go to her MD or get any medicines.  RN requested she have her mother come to talk with RN.  Pt. Says she doesn't have a cell #.  RN asked pt. To return later this pm with her mother.  RN also called and lvm with Flonnie Overman, Social Worker to have her input.

## 2015-12-06 DIAGNOSIS — Z719 Counseling, unspecified: Secondary | ICD-10-CM

## 2015-12-06 NOTE — Congregational Nurse Program (Signed)
Congregational Nurse Program Note  Date of Encounter: 12/06/2015  Past Medical History: Past Medical History  Diagnosis Date  . Anxiety   . Asthma   . Schizophrenia   . Chronic back pain     Encounter Details:     CNP Questionnaire - 12/06/15 1457    Patient Demographics   Is this a new or existing patient? Existing   Patient is considered a/an Not Applicable   Race African-American/Black   Patient Assistance   Location of Patient Assistance [facility]   Patient's financial/insurance status Medicaid   Uninsured Patient No   Patient referred to apply for the following financial assistance Not Applicable   Food insecurities addressed Not Applicable   Transportation assistance No   Assistance securing medications No   Educational health offerings Behavioral health   Encounter Details   Primary purpose of visit Education/Health Concerns   Was an Emergency Department visit averted? Not Applicable   Does patient have a medical provider? No   Patient referred to Other (comment)  RN called left message for social worker, K. Bradly Bienenstock to inform her of mother saying she left  her MCD papers in [place].  RN asked pt. to have her mother come to talk wtih RN.  SW not present today   Was a mental health screening completed? (GAINS tool) No   Does patient have dental issues? Yes   Was a dental referral made? No  has no way to get to dentist nor PCP for a referral.   Does patient have vision issues? No   Since previous encounter, have you referred patient for abnormal blood pressure that resulted in a new diagnosis or medication change? No   Since previous encounter, have you referred patient for abnormal blood glucose that resulted in a new diagnosis or medication change? No       12/06/15  Patient lives with her 3 chidren and her mother -Delton Coombes phone # [phone] who has legal guardianship of patient.  Patient has Medicaid and is receiving food stamps, (Medicaid card  accidentally left in [place] and her relatives have not mailed back to her) history of Paranoid schizophrenia and currently not on any medications nor receiving treatment.  Patients mother has agreed to come and meet with me and the CSWEI next week to arrange an appointment and transportation to seek treatment.  Patient and mother do not have a car, although grandmother does.  Fransisco Beau RN, Teacher, adult education (office # [phone])

## 2015-12-13 DIAGNOSIS — Z7189 Other specified counseling: Secondary | ICD-10-CM

## 2015-12-14 NOTE — Congregational Nurse Program (Signed)
Congregational Nurse Program Note  Date of Encounter: 12/13/2015  Past Medical History: Past Medical History  Diagnosis Date  . Anxiety   . Asthma   . Schizophrenia   . Chronic back pain     Encounter Details:     CNP Questionnaire - 12/13/15 0916    Patient Demographics   Is this a new or existing patient? Existing   Patient is considered a/an Not Applicable   Race African-American/Black   Patient Assistance   Location of Patient [place]   Patient's financial/insurance status Medicaid   Uninsured Patient No   Patient referred to apply for the following financial assistance Not Applicable   Food insecurities addressed Not Applicable   Transportation assistance Yes   Type of Assistance Other   Assistance securing medications Yes   Type of Assistance Other   Educational health offerings Behavioral health;Medications;Navigating the healthcare system   Encounter Details   Primary purpose of visit Education/Health Concerns;Navigating the Healthcare System   Was an Emergency Department visit averted? Not Applicable   Does patient have a medical provider? No   Patient referred to Other (comment)  RN called left message for social worker, K. Edsel Petrin to inform her of mother saying she left  her MCD papers in [place].  RN asked pt. to have her mother come to talk wtih RN.  SW not present today   Was a mental health screening completed? (GAINS tool) Yes   Was a mental health referral made? Yes   Does patient have dental issues? Yes   Was a dental referral made? No  has no way to get to dentist nor PCP for a referral.   Does patient have vision issues? No   Since previous encounter, have you referred patient for abnormal blood pressure that resulted in a new diagnosis or medication change? No   Since previous encounter, have you referred patient for abnormal blood glucose that resulted in a new diagnosis or medication change? No      12/13/15 Met with  patient and patient's mother, GAINS screening performed by K. Finis Bud student, patient is currently not on any medications for Paranoid Schizophrenia and has not received any treatment/counseling since August 2016.  Mother states patient also has a learning disability.  Appt arranged for patient at [facility] on Monday, December 17, 2015 at 10:00, transportation has been arranged thru [place], mother states she will go with patient to this appt.  Patient's new Medicaid card is currently being mailed to her from [place], [place], she should receive it within 1 week. Learned that patients DSS case worker is G. Phineas Semen CSWEI spoke with her over the telephone.

## 2015-12-17 DIAGNOSIS — F209 Schizophrenia, unspecified: Secondary | ICD-10-CM | POA: Diagnosis not present

## 2015-12-20 DIAGNOSIS — Z09 Encounter for follow-up examination after completed treatment for conditions other than malignant neoplasm: Secondary | ICD-10-CM

## 2015-12-20 NOTE — Congregational Nurse Program (Signed)
Congregational Nurse Program Note  Date of Encounter: 12/20/2015  Past Medical History: Past Medical History  Diagnosis Date  . Anxiety   . Asthma   . Schizophrenia   . Chronic back pain     Encounter Details:     CNP Questionnaire - 12/20/15 1404    Patient Demographics   Is this a new or existing patient? Existing   Patient is considered a/an Not Applicable   Race African-American/Black   Patient Assistance   Location of Patient Assistance [facility]   Patient's financial/insurance status Medicaid   Uninsured Patient No   Patient referred to apply for the following financial assistance Not Applicable   Food insecurities addressed Not Applicable   Transportation assistance No   Type of Assistance Other   Assistance securing medications No   Educational health offerings Behavioral health;Other;Medications   Encounter Details   Primary purpose of visit Education/Health Concerns;Navigating the Healthcare System   Was an Emergency Department visit averted? Not Applicable   Patient referred to --  RN called left message for social worker, Kirtland Bouchard. Bradly Bienenstock to inform her of mother saying she left  her MCD papers in [place].  RN asked pt. to have her mother come to talk wtih RN.  SW not present today   Was a mental health screening completed? (GAINS tool) No   Was a mental health referral made? No   Does patient have dental issues? Yes   Was a dental referral made? No  has no way to get to dentist nor PCP for a referral.   Does patient have vision issues? No   Since previous encounter, have you referred patient for abnormal blood pressure that resulted in a new diagnosis or medication change? No   Since previous encounter, have you referred patient for abnormal blood glucose that resulted in a new diagnosis or medication change? No     12/20/15 follow up visit at [facility], patient was seen by [facility] on Monday, Dec 10, 2015, used [employer]. Patient states she is taking her new medications and feels better since starting them. Patient stated she would like to get a job but does not have any method of transportation. Fransisco Beau CNP [phone]

## 2015-12-27 DIAGNOSIS — Z139 Encounter for screening, unspecified: Secondary | ICD-10-CM

## 2015-12-27 NOTE — Congregational Nurse Program (Signed)
Congregational Nurse Program Note  Date of Encounter: 12/27/2015  Past Medical History: Past Medical History  Diagnosis Date  . Anxiety   . Asthma   . Schizophrenia   . Chronic back pain     Encounter Details:     CNP Questionnaire - 12/27/15 1648    Patient Demographics   Is this a new or existing patient? Existing   Patient is considered a/an Not Applicable   Race African-American/Black   Patient Assistance   Location of Patient Assistance [facility]   Patient's financial/insurance status Medicaid   Uninsured Patient No   Patient referred to apply for the following financial assistance Not Applicable   Food insecurities addressed Not Applicable   Transportation assistance No   Type of Assistance Other   Assistance securing medications No   Type of Assistance Other   Educational health offerings Behavioral health;Other;Medications   Encounter Details   Primary purpose of visit Education/Health Concerns;Navigating the Healthcare System   Was an Emergency Department visit averted? Not Applicable   Does patient have a medical provider? Yes   Patient referred to Not Applicable  RN called left message for social worker, Kirtland Bouchard. Bradly Bienenstock to inform her of mother saying she left  her MCD papers in [place].  RN asked pt. to have her mother come to talk wtih RN.  SW not present today   Was a mental health screening completed? (GAINS tool) No   Was a mental health referral made? No   Does patient have dental issues? Yes   Was a dental referral made? No  has no way to get to dentist nor PCP for a referral.   Does patient have vision issues? No   Since previous encounter, have you referred patient for abnormal blood pressure that resulted in a new diagnosis or medication change? No   Since previous encounter, have you referred patient for abnormal blood glucose that resulted in a new diagnosis or medication change? No     Patient seen at [facility], she brought  her 3 new prescriptions prescribed by Dr. Winifred Olive on 12/18/15, learned that patient has not been taking medications properly.  Called patients mother to confirm and she stated that she does not manage the medications for the patient and was unaware that she was taking them incorrectly.  Shared with mother and patient that I will call Dr. Weston Settle to make him aware of this. 1503 called and left message with Nurse Maxine Glenn at [facility] [phone] message contained the following information: 1)  Benztropine 0.5mg , 1 tablet BID (dispensed 60, have 26 remaining) 2)  Trazodone 50 mg, 1/2 tablet at bedtime (dispensed 15, have none left) 3)  Haloperidol 2 mg, 1 tablet at bedtime (dispensed 30, have none left) Left call back number. Medications were filled at [place], [facility] [phone] Provided education on how to take medications properly, wrote instructions on pill container.

## 2015-12-29 ENCOUNTER — Encounter (HOSPITAL_COMMUNITY): Payer: Self-pay | Admitting: *Deleted

## 2015-12-29 ENCOUNTER — Emergency Department (HOSPITAL_COMMUNITY)
Admission: EM | Admit: 2015-12-29 | Discharge: 2015-12-29 | Disposition: A | Discharge status: Home / Self Care | Payer: Medicare Other | Attending: Emergency Medicine | Admitting: Emergency Medicine

## 2015-12-29 DIAGNOSIS — F419 Anxiety disorder, unspecified: Secondary | ICD-10-CM | POA: Insufficient documentation

## 2015-12-29 DIAGNOSIS — R112 Nausea with vomiting, unspecified: Secondary | ICD-10-CM | POA: Insufficient documentation

## 2015-12-29 DIAGNOSIS — F99 Mental disorder, not otherwise specified: Secondary | ICD-10-CM | POA: Diagnosis not present

## 2015-12-29 DIAGNOSIS — R440 Auditory hallucinations: Secondary | ICD-10-CM

## 2015-12-29 DIAGNOSIS — G8929 Other chronic pain: Secondary | ICD-10-CM | POA: Insufficient documentation

## 2015-12-29 DIAGNOSIS — Z79899 Other long term (current) drug therapy: Secondary | ICD-10-CM | POA: Diagnosis not present

## 2015-12-29 DIAGNOSIS — Z008 Encounter for other general examination: Secondary | ICD-10-CM | POA: Diagnosis present

## 2015-12-29 DIAGNOSIS — R4589 Other symptoms and signs involving emotional state: Secondary | ICD-10-CM

## 2015-12-29 DIAGNOSIS — F29 Unspecified psychosis not due to a substance or known physiological condition: Secondary | ICD-10-CM | POA: Diagnosis present

## 2015-12-29 DIAGNOSIS — F200 Paranoid schizophrenia: Secondary | ICD-10-CM | POA: Diagnosis not present

## 2015-12-29 DIAGNOSIS — J45909 Unspecified asthma, uncomplicated: Secondary | ICD-10-CM | POA: Insufficient documentation

## 2015-12-29 DIAGNOSIS — R1111 Vomiting without nausea: Secondary | ICD-10-CM | POA: Diagnosis not present

## 2015-12-29 DIAGNOSIS — R45851 Suicidal ideations: Secondary | ICD-10-CM | POA: Diagnosis not present

## 2015-12-29 DIAGNOSIS — Z3202 Encounter for pregnancy test, result negative: Secondary | ICD-10-CM | POA: Insufficient documentation

## 2015-12-29 LAB — CBC
HCT: 40.7 % (ref 36.0–46.0)
Hemoglobin: 12.6 g/dL (ref 12.0–15.0)
MCH: 28.1 pg (ref 26.0–34.0)
MCHC: 31 g/dL (ref 30.0–36.0)
MCV: 90.8 fL (ref 78.0–100.0)
PLATELETS: 279 10*3/uL (ref 150–400)
RBC: 4.48 MIL/uL (ref 3.87–5.11)
RDW: 14.3 % (ref 11.5–15.5)
WBC: 6 10*3/uL (ref 4.0–10.5)

## 2015-12-29 LAB — COMPREHENSIVE METABOLIC PANEL
ALT: 14 U/L (ref 14–54)
AST: 17 U/L (ref 15–41)
Albumin: 4 g/dL (ref 3.5–5.0)
Alkaline Phosphatase: 57 U/L (ref 38–126)
Anion gap: 10 (ref 5–15)
BUN: 8 mg/dL (ref 6–20)
CHLORIDE: 105 mmol/L (ref 101–111)
CO2: 24 mmol/L (ref 22–32)
CREATININE: 0.7 mg/dL (ref 0.44–1.00)
Calcium: 9.8 mg/dL (ref 8.9–10.3)
GFR calc non Af Amer: >60 mL/min (ref >60)
Glucose, Bld: 111 mg/dL — ABNORMAL HIGH (ref 65–99)
POTASSIUM: 4.3 mmol/L (ref 3.5–5.1)
Sodium: 139 mmol/L (ref 135–145)
Total Bilirubin: 0.6 mg/dL (ref 0.3–1.2)
Total Protein: 7.9 g/dL (ref 6.5–8.1)

## 2015-12-29 LAB — LIPASE, BLOOD: LIPASE: 24 U/L (ref 11–51)

## 2015-12-29 LAB — ACETAMINOPHEN LEVEL: Acetaminophen (Tylenol), Serum: <10 ug/mL — ABNORMAL LOW (ref 10–30)

## 2015-12-29 LAB — PREGNANCY, URINE: PREG TEST UR: NEGATIVE

## 2015-12-29 LAB — RAPID URINE DRUG SCREEN, HOSP PERFORMED
Amphetamines: NOT DETECTED
Barbiturates: NOT DETECTED
Benzodiazepines: NOT DETECTED
COCAINE: NOT DETECTED
OPIATES: NOT DETECTED
Tetrahydrocannabinol: NOT DETECTED

## 2015-12-29 LAB — ETHANOL

## 2015-12-29 LAB — SALICYLATE LEVEL

## 2015-12-29 MED ORDER — BENZTROPINE MESYLATE 1 MG PO TABS
0.5000 mg | ORAL_TABLET | Freq: Two times a day (BID) | ORAL | Status: DC
  Administered 2015-12-29: 0.5 mg via ORAL
  Filled 2015-12-29: qty 1

## 2015-12-29 MED ORDER — ONDANSETRON HCL 4 MG PO TABS: 4.0000 mg | ORAL_TABLET | Freq: Three times a day (TID) | ORAL | Status: DC | PRN

## 2015-12-29 MED ORDER — ACETAMINOPHEN 325 MG PO TABS: 650.0000 mg | ORAL_TABLET | ORAL | Status: DC | PRN

## 2015-12-29 MED ORDER — ALUM & MAG HYDROXIDE-SIMETH 200-200-20 MG/5ML PO SUSP: 30.0000 mL | ORAL | Status: DC | PRN

## 2015-12-29 MED ORDER — HALOPERIDOL 2 MG PO TABS: 2.0000 mg | ORAL_TABLET | Freq: Every day | ORAL | Status: DC

## 2015-12-29 MED ORDER — IBUPROFEN 200 MG PO TABS: 600.0000 mg | ORAL_TABLET | Freq: Three times a day (TID) | ORAL | Status: DC | PRN

## 2015-12-29 MED ORDER — ONDANSETRON 4 MG PO TBDP
4.0000 mg | ORAL_TABLET | Freq: Once | ORAL | Status: AC
  Administered 2015-12-29: 4 mg via ORAL
  Filled 2015-12-29: qty 1

## 2015-12-29 MED ORDER — LORAZEPAM 1 MG PO TABS
1.0000 mg | ORAL_TABLET | Freq: Three times a day (TID) | ORAL | Status: DC | PRN
  Administered 2015-12-29: 1 mg via ORAL
  Filled 2015-12-29: qty 1

## 2015-12-29 MED ORDER — TRAZODONE HCL 50 MG PO TABS: 25.0000 mg | ORAL_TABLET | Freq: Every evening | ORAL | Status: DC | PRN

## 2015-12-29 MED ORDER — ZOLPIDEM TARTRATE 5 MG PO TABS: 5.0000 mg | ORAL_TABLET | Freq: Every evening | ORAL | Status: DC | PRN

## 2015-12-29 NOTE — Discharge Instructions (Signed)
Paranoia Paranoia is a general mistrust of others. People with paranoia may feel as though people are "out to get them" and the world is against them without reason. They may be afraid of others or behave in an angry or hostile way toward others. HOME CARE INSTRUCTIONS Monitor your thoughts and mood for any changes. Take these steps to help with your condition:  Take over-the-counter and prescription medicines only as told by your health care provider.  Check with your health care provider before taking any herbs or supplements.  Keep all follow-up visits as told by your health care provider.This is important.  Maintain a healthy lifestyle:  Eat a healthy diet.  Exercise regularly.  Get plenty of sleep.  Avoid alcohol and drugs.  Learn ways to reduce stress and cope with stress, such as with yoga and meditation.  Talk about your feelings with family members or health care providers.  Make time for yourself to do things that you enjoy. SEEK MEDICAL CARE IF:  Medicines do not seem to be helping.  You feel extremely fearful and suspicious that something will harm you.  You feel hopeless and overwhelmed.  You feel like you cannot leave your house.  You have trouble taking care of yourself. SEEK IMMEDIATE MEDICAL CARE IF:  You have serious thoughts about hurting yourself or others.   This information is not intended to replace advice given to you by your health care provider. Make sure you discuss any questions you have with your health care provider.   Document Released: 10/16/2003 Document Revised: 02/27/2015 Document Reviewed: 08/20/2014 Elsevier Interactive Patient Education 2016 [employer]  Schizophrenia Schizophrenia is a mental illness. It may cause disturbed or disorganized thinking, speech, or behavior. People with schizophrenia have problems functioning in one or more areas of life: work, school, home, or relationships. People with schizophrenia are at  increased risk for suicide, certain chronic physical illnesses, and unhealthy behaviors, such as smoking and drug use. People who have family members with schizophrenia are at higher risk of developing the illness. Schizophrenia affects men and women equally but usually appears at an earlier age (teenage or early adult years) in men.  SYMPTOMS The earliest symptoms are often subtle (prodrome) and may go unnoticed until the illness becomes more severe (first-break psychosis). Symptoms of schizophrenia may be continuous or may come and go in severity. Episodes often are triggered by major life events, such as family stress, college, [employer] service, marriage, pregnancy or child birth, divorce, or loss of a loved one. People with schizophrenia may see, hear, or feel things that do not exist (hallucinations). They may have false beliefs in spite of obvious proof to the contrary (delusions). Sometimes speech is incoherent or behavior is odd or withdrawn.  DIAGNOSIS Schizophrenia is diagnosed through an assessment by your caregiver. Your caregiver will ask questions about your thoughts, behavior, mood, and ability to function in daily life. Your caregiver may ask questions about your medical history and use of alcohol or drugs, including prescription medication. Your caregiver may also order blood tests and imaging exams. Certain medical conditions and substances can cause symptoms that resemble schizophrenia. Your caregiver may refer you to a mental health specialist for evaluation. There are three major criterion for a diagnosis of schizophrenia:  Two or more of the following five symptoms are present for a month or longer:  Delusions. Often the delusions are that you are being attacked, harassed, cheated, persecuted or conspired against (persecutory delusions).  Hallucinations.   Disorganized speech  that does not make sense to others.  Grossly disorganized (confused or unfocused) behavior or extremely  overactive or underactive motor activity (catatonia).  Negative symptoms such as bland or blunted emotions (flat affect), loss of will power (avolition), and withdrawal from social contacts (social isolation).  Level of functioning in one or more major areas of life (work, school, relationships, or self-care) is markedly below the level of functioning before the onset of illness.   There are continuous signs of illness (either mild symptoms or decreased level of functioning) for at least 6 months or longer. TREATMENT  Schizophrenia is a long-term illness. It is best controlled with continuous treatment rather than treatment only when symptoms occur. The following treatments are used to manage schizophrenia:  Medication--Medication is the most effective and important form of treatment for schizophrenia. Antipsychotic medications are usually prescribed to help manage schizophrenia. Other types of medication may be added to relieve any symptoms that may occur despite the use of antipsychotic medications.  Counseling or talk therapy--Individual, group, or family counseling may be helpful in providing education, support, and guidance. Many people with schizophrenia also benefit from social skills and job skills (vocational) training. A combination of medication and counseling is best for managing the disorder over time. A procedure in which electricity is applied to the brain through the scalp (electroconvulsive therapy) may be used to treat catatonic schizophrenia or schizophrenia in people who cannot take or do not respond to medication and counseling.   This information is not intended to replace advice given to you by your health care provider. Make sure you discuss any questions you have with your health care provider.   Document Released: 10/10/2000 Document Revised: 11/03/2014 Document Reviewed: 01/05/2013 Elsevier Interactive Patient Education [employer]

## 2015-12-29 NOTE — ED Notes (Signed)
She is a bit hyperactive; fixated on what time breakfast arrives.  She ambulates without difficulty and I get her a ginger ale at this time per her request.

## 2015-12-29 NOTE — ED Provider Notes (Signed)
CSN: [id number]     Arrival date & time 12/29/15  0423 History   First MD Initiated Contact with Patient 12/29/15 [phone]     Chief Complaint  Patient presents with  . Psychiatric Evaluation     (Consider location/radiation/quality/duration/timing/severity/associated sxs/prior Treatment) HPI   8:02 AM BP 143/80 mmHg  Pulse 93  Temp(Src) 98.7 F (37.1 C)  Resp 18  SpO2 97%  LMP 11/28/2015 Patient is a 35 y/ o female with a pmh of paranoid schizophrenia who presents to the ED with c/o nausea and vomiting along with increased Paranoia. The patient states that she has had daily nausea and vomiting for the past week. She states that she has had nausea and vomiting all week and has not been able to hold down any of her medications. She is unsure if she could be pregnant. She does have 3 children at home. The patient states because she is unable to hold down her medications. Her paranoia has gotten worse. She is hearing command voices that are telling to her to hurt herself. She has never attempted suicide, but she is very frightened and tearful. She denies homicidal ideation, but states she is unsure if she could hurt someone else. She is not using any other illicit substances or alcohol. She has no abdominal pain and is currently holding down fluids. Past Medical History  Diagnosis Date  . Anxiety   . Asthma   . Schizophrenia (HCC)   . Chronic back pain    History reviewed. No pertinent past surgical history. No family history on file. Social History  Substance Use Topics  . Smoking status: Never Smoker   . Smokeless tobacco: None  . Alcohol Use: No   OB History    No data available     Review of Systems  Unable to perform ROS: Psychiatric disorder      Allergies  Review of patient's allergies indicates no known allergies.  Home Medications   Prior to Admission medications   Medication Sig Start Date End Date Taking? Authorizing Provider  benztropine (COGENTIN) 0.5 MG  tablet Take 0.5 mg by mouth 2 (two) times daily.   Yes Historical Provider, MD  haloperidol (HALDOL) 2 MG tablet Take 1 tablet (2 mg total) by mouth at bedtime. Patient not taking: Reported on 04/25/2015 09/20/14   Shuvon B Rankin, NP  haloperidol decanoate (HALDOL DECANOATE) 100 MG/ML injection Inject 100 mg into the muscle every 30 (thirty) days. 05/08/14   Historical Provider, MD  traZODone (DESYREL) 50 MG tablet Take 0.5 tablets (25 mg total) by mouth at bedtime as needed for sleep. Patient not taking: Reported on 12/29/2015 04/25/15   Toy Cookey, MD   BP 143/80 mmHg  Pulse 93  Temp(Src) 98.7 F (37.1 C)  Resp 18  SpO2 97%  LMP 11/28/2015 Physical Exam  Constitutional: She appears well-developed and well-nourished. No distress.  HENT:  Head: Normocephalic and atraumatic.  Poor dentition  Eyes: Conjunctivae are normal. No scleral icterus.  Neck: Normal range of motion.  Cardiovascular: Normal rate, regular rhythm and normal heart sounds.  Exam reveals no gallop and no friction rub.   No murmur heard. Pulmonary/Chest: Effort normal and breath sounds normal. No respiratory distress.  Abdominal: Soft. Bowel sounds are normal. She exhibits no distension and no mass. There is no tenderness. There is no guarding.  Neurological: She is alert.  Skin: Skin is warm and dry. She is not diaphoretic.  Psychiatric: Her mood appears anxious. Her affect is labile. Her speech  is delayed. She is actively hallucinating. Thought content is delusional.  Appears to be responding to internal stimuli. Slowed. Tearful. Agitated and labile.  Nursing note and vitals reviewed.   ED Course  Procedures (including critical care time) Labs Review Labs Reviewed  COMPREHENSIVE METABOLIC PANEL - Abnormal; Notable for the following:    Glucose, Bld 111 (*)    All other components within normal limits  ACETAMINOPHEN LEVEL - Abnormal; Notable for the following:    Acetaminophen (Tylenol), Serum <10 (*)    All  other components within normal limits  ETHANOL  SALICYLATE LEVEL  CBC  URINE RAPID DRUG SCREEN, HOSP PERFORMED  LIPASE, BLOOD  POC URINE PREG, ED    Imaging Review No results found. I have personally reviewed and evaluated these images and lab results as part of my medical decision-making.   EKG Interpretation None      MDM   Final diagnoses:  Paranoid schizophrenia (HCC)  Auditory hallucinations  Thoughts of self harm    Patient here with c/o paranoia, worsening auditory hallucinations with commands for self harm. C/o vominting, but no active vomiting her in the ED and tolerating PO fluids. Her labs show no abnormality to explain her c/o vomiting.   Patient seen by Psych provider. They do not feel she is an immediate threat. She has good OP follow up and they feel she is safe for discharge.  I will discharge the patient .   Arthor Captainbigail Angelo Caroll, PA-C 12/29/15 1609  Lavera Guiseana Duo Liu, MD 12/29/15 [phone]

## 2015-12-29 NOTE — ED Notes (Signed)
Pt says she called ems because she needs some help and wants to be sent to behavioral health, speak to someone,  because she needs help with her medicine to keep her body calm, states she can't sleep at night. Pt denies SI or HI. Pt also states she has had vomiting for a week. Pt says she is hearing voices and seeing things that are not there.

## 2015-12-29 NOTE — ED Notes (Signed)
Pt was informed not to get up without using the call bell

## 2015-12-29 NOTE — ED Notes (Signed)
Patient has two bags of belongings in locker 27. 

## 2015-12-29 NOTE — ED Notes (Signed)
[phone] Mother would like to be called by patient. Tammy

## 2015-12-29 NOTE — Consult Note (Signed)
[facility] Consult   Reason for Consult:  "hearing voices" Referring Physician:  EDP Patient Identification: Kathy Brown MRN:  [id number] Principal Diagnosis: Psychosis Diagnosis:   Patient Active Problem List   Diagnosis Date Noted  . Schizoaffective disorder ([place]) [F25.9] 03/25/2014    Priority: Low  . Auditory hallucinations [R44.0]   . Paranoid schizophrenia ([place]) [F20.0]   . Psychosis [F29] 03/25/2014  . Paranoia ([place]) [F22] 01/29/2014    Total Time spent with patient: 30 minutes  Subjective:   Kathy Brown is a 35 y.o. female patient presented to [facility] via EMS with complaints of nausea/vomiting and states that she was hearing voices.  HPI:  Kathy Brown 35 yr old black female in [place] with complaints of nausea and vomiting that she states is better now.  Patient reports that she called EMS because she did not feel good "I kept throwing up.  I didn't feel good."  Patient states that she does hear voices but none command.  State that she does not take her medication.  Patient admits that she has been hearing the voices but doesn't bother her and doesn't say anything bad.  Patient states that she lives with her mother and her three children.  Patient denies suicidal ideation; stating that she does not want to hurt or kill her self; and homicidal ideation; stating that she does not want to hurt or kill anyone.  Patient also denies paranoia.  Patient seen at [facility] on 12/27/15 where she was given instructions on how to take her medication and Dr. Mortimer Fries to make he was aware.      Past Psychiatric History:  Psychosis, multiple psych hospitalization; last admission to [facility] 02/2014; denies prior suicide attempt and violent behavior.    Risk to Self: Is patient at risk for suicide?: No, but patient needs Medical Clearance Risk to Others:  No Prior Inpatient Therapy:  Yes Prior Outpatient Therapy:  Currently with  [facility] and Dr. Mortimer Fries  Past Medical History:  Past Medical History  Diagnosis Date  . Anxiety   . Asthma   . Schizophrenia ([place])   . Chronic back pain    History reviewed. No pertinent past surgical history. Family History: History reviewed. No pertinent family history. Family Psychiatric  History: Denies Social History:  History  Alcohol Use No     History  Drug Use No    Social History   Social History  . Marital Status: Single    Spouse Name: N/A  . Number of Children: N/A  . Years of Education: N/A   Social History Main Topics  . Smoking status: Never Smoker   . Smokeless tobacco: None  . Alcohol Use: No  . Drug Use: No  . Sexual Activity: Yes   Other Topics Concern  . None   Social History Narrative   Additional Social History:    Allergies:  No Known Allergies  Labs:  Results for orders placed or performed during the hospital encounter of 12/29/15 (from the past 48 hour(s))  Pregnancy, urine     Status: None   Collection Time: 12/29/15  5:00 AM  Result Value Ref Range   Preg Test, Ur NEGATIVE NEGATIVE    Comment:        THE SENSITIVITY OF THIS METHODOLOGY IS >20 mIU/mL.   Comprehensive metabolic panel     Status: Abnormal   Collection Time: 12/29/15  5:02 AM  Result Value Ref Range   Sodium  139 135 - 145 mmol/L   Potassium 4.3 3.5 - 5.1 mmol/L   Chloride 105 101 - 111 mmol/L   CO2 24 22 - 32 mmol/L   Glucose, Bld 111 (H) 65 - 99 mg/dL   BUN 8 6 - 20 mg/dL   Creatinine, Ser 0.70 0.44 - 1.00 mg/dL   Calcium 9.8 8.9 - 10.3 mg/dL   Total Protein 7.9 6.5 - 8.1 g/dL   Albumin 4.0 3.5 - 5.0 g/dL   AST 17 15 - 41 U/L   ALT 14 14 - 54 U/L   Alkaline Phosphatase 57 38 - 126 U/L   Total Bilirubin 0.6 0.3 - 1.2 mg/dL   GFR calc non Af Amer >60 >60 mL/min   GFR calc Af Amer >60 >60 mL/min    Comment: (NOTE) The eGFR has been calculated using the CKD EPI equation. This calculation has not been validated in all clinical  situations. eGFR's persistently <60 mL/min signify possible Chronic Kidney Disease.    Anion gap 10 5 - 15  Ethanol (ETOH)     Status: None   Collection Time: 12/29/15  5:02 AM  Result Value Ref Range   Alcohol, Ethyl (B) <5 <5 mg/dL    Comment:        LOWEST DETECTABLE LIMIT FOR SERUM ALCOHOL IS 5 mg/dL FOR MEDICAL PURPOSES ONLY   Salicylate level     Status: None   Collection Time: 12/29/15  5:02 AM  Result Value Ref Range   Salicylate Lvl <9.0 2.8 - 30.0 mg/dL  Acetaminophen level     Status: Abnormal   Collection Time: 12/29/15  5:02 AM  Result Value Ref Range   Acetaminophen (Tylenol), Serum <10 (L) 10 - 30 ug/mL    Comment:        THERAPEUTIC CONCENTRATIONS VARY SIGNIFICANTLY. A RANGE OF 10-30 ug/mL MAY BE AN EFFECTIVE CONCENTRATION FOR MANY PATIENTS. HOWEVER, SOME ARE BEST TREATED AT CONCENTRATIONS OUTSIDE THIS RANGE. ACETAMINOPHEN CONCENTRATIONS >150 ug/mL AT 4 HOURS AFTER INGESTION AND >50 ug/mL AT 12 HOURS AFTER INGESTION ARE OFTEN ASSOCIATED WITH TOXIC REACTIONS.   CBC     Status: None   Collection Time: 12/29/15  5:02 AM  Result Value Ref Range   WBC 6.0 4.0 - 10.5 K/uL   RBC 4.48 3.87 - 5.11 MIL/uL   Hemoglobin 12.6 12.0 - 15.0 g/dL   HCT 40.7 36.0 - 46.0 %   MCV 90.8 78.0 - 100.0 fL   MCH 28.1 26.0 - 34.0 pg   MCHC 31.0 30.0 - 36.0 g/dL   RDW 14.3 11.5 - 15.5 %   Platelets 279 150 - 400 K/uL  Lipase, blood     Status: None   Collection Time: 12/29/15  5:02 AM  Result Value Ref Range   Lipase 24 11 - 51 U/L  Urine rapid drug screen (hosp performed) (Not at [facility])     Status: None   Collection Time: 12/29/15  5:06 AM  Result Value Ref Range   Opiates NONE DETECTED NONE DETECTED   Cocaine NONE DETECTED NONE DETECTED   Benzodiazepines NONE DETECTED NONE DETECTED   Amphetamines NONE DETECTED NONE DETECTED   Tetrahydrocannabinol NONE DETECTED NONE DETECTED   Barbiturates NONE DETECTED NONE DETECTED    Comment:        DRUG SCREEN FOR MEDICAL  PURPOSES ONLY.  IF CONFIRMATION IS NEEDED FOR ANY PURPOSE, NOTIFY LAB WITHIN 5 DAYS.        LOWEST DETECTABLE LIMITS FOR URINE DRUG SCREEN Drug Class  Cutoff (ng/mL) Amphetamine      1000 Barbiturate      200 Benzodiazepine   903 Tricyclics       009 Opiates          300 Cocaine          300 THC              50     Current Facility-Administered Medications  Medication Dose Route Frequency Provider Last Rate Last Dose  . acetaminophen (TYLENOL) tablet 650 mg  650 mg Oral Q4H PRN Margarita Mail, PA-C      . alum & mag hydroxide-simeth (MAALOX/MYLANTA) 200-200-20 MG/5ML suspension 30 mL  30 mL Oral PRN Margarita Mail, PA-C      . benztropine (COGENTIN) tablet 0.5 mg  0.5 mg Oral BID Margarita Mail, PA-C   0.5 mg at 12/29/15 1041  . haloperidol (HALDOL) tablet 2 mg  2 mg Oral QHS Abigail Harris, PA-C      . ibuprofen (ADVIL,MOTRIN) tablet 600 mg  600 mg Oral Q8H PRN Margarita Mail, PA-C      . LORazepam (ATIVAN) tablet 1 mg  1 mg Oral Q8H PRN Margarita Mail, PA-C   1 mg at 12/29/15 1041  . ondansetron (ZOFRAN) tablet 4 mg  4 mg Oral Q8H PRN Margarita Mail, PA-C      . traZODone (DESYREL) tablet 25 mg  25 mg Oral QHS PRN Margarita Mail, PA-C      . zolpidem (AMBIEN) tablet 5 mg  5 mg Oral QHS PRN Margarita Mail, PA-C       Current Outpatient Prescriptions  Medication Sig Dispense Refill  . benztropine (COGENTIN) 0.5 MG tablet Take 0.5 mg by mouth 2 (two) times daily.    . haloperidol (HALDOL) 2 MG tablet Take 1 tablet (2 mg total) by mouth at bedtime. (Patient not taking: Reported on 04/25/2015) 5 tablet 0  . haloperidol decanoate (HALDOL DECANOATE) 100 MG/ML injection Inject 100 mg into the muscle every 30 (thirty) days.    . traZODone (DESYREL) 50 MG tablet Take 0.5 tablets (25 mg total) by mouth at bedtime as needed for sleep. (Patient not taking: Reported on 12/29/2015) 10 tablet 0    Musculoskeletal: Strength & Muscle Tone: within normal limits Gait & Station:  normal Patient leans: N/A  Psychiatric Specialty Exam: Review of Systems  Gastrointestinal:       At this time denies nausea and vomiting  Psychiatric/Behavioral: Depression: Denies. Suicidal ideas: Denies. Hallucinations: Stable (Chronic) Substance abuse: Denies. Nervous/anxious: Denies. Insomnia: denies.   All other systems reviewed and are negative.   Blood pressure 107/57, pulse 66, temperature 98.7 F (37.1 C), resp. rate 16, last menstrual period 11/28/2015, SpO2 99 %.There is no weight on file to calculate BMI.  General Appearance: Casual  Eye Contact::  Good  Speech:  Clear and Coherent and Normal Rate  Volume:  Normal  Mood:  "I feel better now"  Affect:  Appropriate  Thought Process:  Circumstantial and Goal Directed  Orientation:  Full (Time, Place, and Person)  Thought Content:  Denies delusions and paranoia.    Suicidal Thoughts:  No  Homicidal Thoughts:  No  Memory:  Immediate;   Fair Recent;   Fair Remote;   Fair  Judgement:  Fair  Insight:  Present  Psychomotor Activity:  Normal  Concentration:  Fair  Recall:  [employer] [place]  Language: Fair  Akathisia:  No  Handed:  Right  AIMS (if indicated):  Assets:  Communication Skills Desire for Improvement Social Support  ADL's:  Intact  Cognition: WNL Possible learning disability  Sleep:      Treatment Plan Summary: Plan Once medically cleared patient can be discharged to continue following up with [facility]   Disposition: No evidence of imminent risk to self or others at present.   Patient does not meet criteria for psychiatric inpatient admission. EDP to discharge.  Patient to following up with [facility] and Dr. Daivd Council, Humphrey, NP 12/29/2015 2:15 PM  Patient seen, chart reviewed and case discussed with treatment team for face-to-face psychiatric evaluation and formulated treatment plan. Reviewed the information documented and agree with the treatment  plan.  Jemima Petko,JANARDHAHA R. 12/29/2015 2:47 PM

## 2016-01-03 ENCOUNTER — Telehealth: Payer: Self-pay

## 2016-01-03 NOTE — Telephone Encounter (Signed)
01/02/2016 [place] back [place] [facility], explained how patient had been taking medication incorrectly, she will inform the doctor.

## 2016-01-05 ENCOUNTER — Encounter (HOSPITAL_COMMUNITY): Payer: Self-pay | Admitting: Emergency Medicine

## 2016-01-05 ENCOUNTER — Emergency Department (HOSPITAL_COMMUNITY)
Admission: EM | Admit: 2016-01-05 | Discharge: 2016-01-05 | Disposition: A | Discharge status: Home / Self Care | Payer: Medicare Other | Attending: Emergency Medicine | Admitting: Emergency Medicine

## 2016-01-05 DIAGNOSIS — Z046 Encounter for general psychiatric examination, requested by authority: Secondary | ICD-10-CM | POA: Insufficient documentation

## 2016-01-05 DIAGNOSIS — R45851 Suicidal ideations: Secondary | ICD-10-CM | POA: Diagnosis not present

## 2016-01-05 DIAGNOSIS — F200 Paranoid schizophrenia: Secondary | ICD-10-CM

## 2016-01-05 DIAGNOSIS — J45909 Unspecified asthma, uncomplicated: Secondary | ICD-10-CM | POA: Diagnosis not present

## 2016-01-05 DIAGNOSIS — Z79899 Other long term (current) drug therapy: Secondary | ICD-10-CM | POA: Diagnosis not present

## 2016-01-05 DIAGNOSIS — F419 Anxiety disorder, unspecified: Secondary | ICD-10-CM | POA: Insufficient documentation

## 2016-01-05 DIAGNOSIS — Z008 Encounter for other general examination: Secondary | ICD-10-CM

## 2016-01-05 DIAGNOSIS — G8929 Other chronic pain: Secondary | ICD-10-CM | POA: Diagnosis not present

## 2016-01-05 LAB — COMPREHENSIVE METABOLIC PANEL
ALT: 9 U/L — AB (ref 14–54)
AST: 14 U/L — AB (ref 15–41)
Albumin: 4 g/dL (ref 3.5–5.0)
Alkaline Phosphatase: 44 U/L (ref 38–126)
Anion gap: 9 (ref 5–15)
BILIRUBIN TOTAL: 0.5 mg/dL (ref 0.3–1.2)
BUN: 10 mg/dL (ref 6–20)
CO2: 26 mmol/L (ref 22–32)
CREATININE: 0.58 mg/dL (ref 0.44–1.00)
Calcium: 8.9 mg/dL (ref 8.9–10.3)
Chloride: 106 mmol/L (ref 101–111)
Glucose, Bld: 89 mg/dL (ref 65–99)
POTASSIUM: 3.9 mmol/L (ref 3.5–5.1)
Sodium: 141 mmol/L (ref 135–145)
TOTAL PROTEIN: 7.4 g/dL (ref 6.5–8.1)

## 2016-01-05 LAB — RAPID URINE DRUG SCREEN, HOSP PERFORMED
Amphetamines: NOT DETECTED
Barbiturates: NOT DETECTED
Benzodiazepines: NOT DETECTED
Cocaine: NOT DETECTED
OPIATES: NOT DETECTED
TETRAHYDROCANNABINOL: NOT DETECTED

## 2016-01-05 LAB — CBC
HCT: 35.4 % — ABNORMAL LOW (ref 36.0–46.0)
Hemoglobin: 11.7 g/dL — ABNORMAL LOW (ref 12.0–15.0)
MCH: 28.8 pg (ref 26.0–34.0)
MCHC: 33.1 g/dL (ref 30.0–36.0)
MCV: 87.2 fL (ref 78.0–100.0)
PLATELETS: 253 10*3/uL (ref 150–400)
RBC: 4.06 MIL/uL (ref 3.87–5.11)
RDW: 14.4 % (ref 11.5–15.5)
WBC: 7.3 10*3/uL (ref 4.0–10.5)

## 2016-01-05 LAB — ACETAMINOPHEN LEVEL: Acetaminophen (Tylenol), Serum: <10 ug/mL — ABNORMAL LOW (ref 10–30)

## 2016-01-05 LAB — ETHANOL

## 2016-01-05 LAB — SALICYLATE LEVEL

## 2016-01-05 MED ORDER — HALOPERIDOL 2 MG PO TABS: 2.0000 mg | ORAL_TABLET | Freq: Every day | ORAL | Status: DC

## 2016-01-05 MED ORDER — BENZTROPINE MESYLATE 1 MG PO TABS: 0.5000 mg | ORAL_TABLET | Freq: Two times a day (BID) | ORAL | Status: DC

## 2016-01-05 MED ORDER — TRAZODONE HCL 50 MG PO TABS: 25.0000 mg | ORAL_TABLET | Freq: Every evening | ORAL | Status: DC | PRN

## 2016-01-05 NOTE — ED Notes (Signed)
Patient reports she called the police because she was afraid to be alone at home by herself. Patient endorses SI without plan, denies HI. Patient endorses AH, voices telling her to hurt herself.

## 2016-01-05 NOTE — BH Assessment (Signed)
Attempted to contact patients mother who she reports is her legal guardian at [phone] and there was a recording indicating that the number did not accept [phone] calls.    Contacted patients mother at the number on the chart [phone] and the phone did not ring. There was an option to leave a message but there was no identifying information on the voicemail. Attempted to contact patients grandmother at [phone]    There was another number on the chart [phone] and spoke with patients mother Ms. Bascom LevelsFrazier. Spoke with patients mother Ms. Bascom LevelsFrazier who states that she is not the patients guardian but is her mother. She states that the patient was given medication last time was here and reports that the patient was here last week and reports that she thinks that her medication changed and she thinks that she needs her medications need to be changed. She reports that "she been on the medication for so long, I don't think it's doing anything."  Patients mother states that she feels that the patient can return home and be safe.  Patients mother states that the patient has never done anything to hurt herself or anyone else.   Patients mother states that patient has been [facility] about four years and she goes every three months. She states that the patients last appointment was December 18, 2015.  Patients mother states that she has an appointment in May and was informed that she could call and get the appointment changed. Patients mother states that she feels comfortable with the patient returning home but reports that there is no transportation and there is no bus line and states that she does not have a ride to get the patient.   Davina PokeJoVea Ladaisha Portillo, LCSW Therapeutic Triage Specialist  Health 01/05/2016 5:19 PM

## 2016-01-05 NOTE — ED Provider Notes (Signed)
CSN: [id number]     Arrival date & time 01/05/16  1436 History   First MD Initiated Contact with Patient 01/05/16 1553     Chief Complaint  Patient presents with  . Suicidal   HPI Kathy Brown is a 35 y.o. female PMH significant for schizophrenia, anxiety presenting with suicidal ideations without plan. When asked about homicidal ideation she states "I don't want to hurt the kids" and starts naming off random states and talking about cleaning their rooms. She told the RN that she endorses auditory hallucinations that are telling her to hurt herself; however, when she is asked by me she stares blankly and then smiles. Patient requires a lot of redirecting during interview.Patient denies fevers, chills, chest pain, shortness of breath, abdominal pain, nausea, vomiting, change in bowel or bladder habits.  Past Medical History  Diagnosis Date  . Anxiety   . Asthma   . Schizophrenia ([facility])   . Chronic back pain    History reviewed. No pertinent past surgical history. No family history on file. Social History  Substance Use Topics  . Smoking status: Never Smoker   . Smokeless tobacco: None  . Alcohol Use: No   OB History    No data available     Review of Systems  Ten systems are reviewed and are negative for acute change except as noted in the HPI  Allergies  Review of patient's allergies indicates no known allergies.  Home Medications   Prior to Admission medications   Medication Sig Start Date End Date Taking? Authorizing Provider  benztropine (COGENTIN) 0.5 MG tablet Take 0.5 mg by mouth 2 (two) times daily.   Yes Historical Provider, MD  haloperidol (HALDOL) 2 MG tablet Take 1 tablet (2 mg total) by mouth at bedtime. 09/20/14  Yes Shuvon B Rankin, NP  haloperidol decanoate (HALDOL DECANOATE) 100 MG/ML injection Inject 100 mg into the muscle every 30 (thirty) days. 05/08/14  Yes Historical Provider, MD  traZODone (DESYREL) 50 MG tablet Take 0.5 tablets (25 mg total) by mouth  at bedtime as needed for sleep. 04/25/15  Yes Toy CookeyMegan Docherty, MD   BP 121/65 mmHg  Pulse 92  Resp 20  SpO2 100%  LMP 11/28/2015 Physical Exam  Constitutional: She appears well-developed and well-nourished. No distress.  HENT:  Head: Normocephalic and atraumatic.  Eyes: Conjunctivae are normal. Pupils are equal, round, and reactive to light. Right eye exhibits no discharge. Left eye exhibits no discharge. No scleral icterus.  Neck: No tracheal deviation present.  Cardiovascular: Normal rate.   Pulmonary/Chest: Effort normal. No respiratory distress.  Abdominal: Soft. She exhibits no distension.  Musculoskeletal: She exhibits no edema.  Lymphadenopathy:    She has no cervical adenopathy.  Neurological: She is alert. Coordination normal.  Skin: Skin is warm and dry. No rash noted. She is not diaphoretic. No erythema.  Psychiatric: She has a normal mood and affect. Her behavior is normal.  Nursing note and vitals reviewed.   ED Course  Procedures  Labs Review Labs Reviewed  COMPREHENSIVE METABOLIC PANEL - Abnormal; Notable for the following:    AST 14 (*)    ALT 9 (*)    All other components within normal limits  ACETAMINOPHEN LEVEL - Abnormal; Notable for the following:    Acetaminophen (Tylenol), Serum <10 (*)    All other components within normal limits  CBC - Abnormal; Notable for the following:    Hemoglobin 11.7 (*)    HCT 35.4 (*)    All other components  within normal limits  ETHANOL  SALICYLATE LEVEL  URINE RAPID DRUG SCREEN, HOSP PERFORMED    MDM   Final diagnoses:  Encounter for psychological evaluation   TTS consult placed. Basic labs ordered. TTS evaluated patient and states that patient is denying suicidal ideations, homicidal ideations, and current hallucinations. CMP, ethanol, salicylate, acetaminophen, CBC, UDS unremarkable for acute change. Psych NP will be available for tele consult tonight around 8 PM. Patient will be moved to [facility] until then.  Home meds reordered. Psych NP advises patient is not a threat to self or others and does not meet inpatient criteria at this time. Social work consult placed. Patient may be safely discharged home. Discussed return precautions.  Melton Krebs, PA-C 01/06/16 0037  Rolland [facility], MD 01/14/16 1452
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
## 2016-01-05 NOTE — ED Notes (Signed)
Pt given two bus tickets to ride home

## 2016-01-05 NOTE — Discharge Instructions (Signed)
Ms. Kathy Brown,  Nice meeting you! Please follow-up with your primary care provider and psychiatrist. Return to the emergency department if you develop feelings like you are going to hurt yourself, others, or see or hear things other people may not.

## 2016-01-05 NOTE — Consult Note (Signed)
[facility] Consult   Reason for Consult:  ED referral Referring Physician:  Alisia Ferrari [facility] Patient Identification: Kathy Brown MRN:  [id number] Principal Diagnosis: <principal problem not specified> Diagnosis:   Patient Active Problem List   Diagnosis Date Noted  . Auditory hallucinations [R44.0]   . Paranoid schizophrenia ([place]) [F20.0]   . Schizoaffective disorder ([place]) [F25.9] 03/25/2014  . Psychosis [F29] 03/25/2014  . Paranoia ([place]) [F22] 01/29/2014    Total Time spent with patient: 15 minutes  Subjective:   Kathy Brown is a 34 y.o. female patient admitted with: Pt is a 35 y.o. female with Pmhx as above including paranoid schizophrenia who presents with report on insomnia of 1 day, which was her chief complaint to EMS. Per EMS, pt' mother is out of town and she also reported she has been out of her trazodone for 3 days. She denied SI/HI to EMS. In chart review, pt has hx of paranoid schizophrenia and has had several visits for paranoia, insomnia when mother has been out of town in the past.  Per EMS report mother will be returning tomorrow.  Patient reported that she did not feel safe at home to nursing, although denies SI or HI to me.  I believe these delusions that someone is breaking into her house to be chronic.  She does not appear acutely psychotic.  I've attempted to call her mother, at [phone], the left a message but received no return call.  I will refill her trazodone.  I've asked her to contact her grandmother to come stay with her.   Spoke with patients mother who patients state is her legal guardian. Mother denies that she is patients legal guardian but states that she feels that patient is safe to come home.   HPI: Repeat ED visit after being left home alone.In speaking with pt she reports being afraid of being alone because of news reports of snow after Mom left. Now says Mom is coming home.Volunteers she has 3 children-says they are with  her mother.  Past Psychiatric History:     Patient seen at [facility], she brought her 3 new prescriptions prescribed by Dr. Darcey Nora on 12/18/15, learned that patient has not been taking medications properly.  Called patients mother to confirm and she stated that she does not manage the medications for the patient and was unaware that she was taking them incorrectly.  Shared with mother and patient that I will call Dr. Mortimer Fries to make him aware of this. 1503 called and left message with Nurse Brayton Layman at [place] message contained the following information: 1)  Benztropine 0.13m, 1 tablet BID (dispensed 60, have 26 remaining) 2)  Trazodone 50 mg, 1/2 tablet at bedtime (dispensed 15, have none left) 3)  Haloperidol 2 mg, 1 tablet at bedtime (dispensed 30, have none left) Left call back number. [phone] education on how to take medications properly, wrote instructions on pill container  Consult Note by JAmbrose Finland MD at 12/29/2015  1:43 PM     Author: JAmbrose Finland MD Service: Psychiatry Author Type: Physician    Filed: 12/29/2015 2:47 PM Note Time: 12/29/2015 1:43 PM Status: Signed    Editor: JAmbrose Finland MD (Physician)      Related Notes: Original Note by SConsuello Closs NP (Nurse Practitioner) filed at 12/29/2015  2:15 PM       [facility]   Reason for Consult:  "hearing voices"  Referring Physician:  EDP Patient Identification: Kathy Brown MRN:  [id number] Principal Diagnosis: Psychosis Diagnosis:   Patient Active Problem List     Diagnosis  Date Noted   .  Schizoaffective disorder ([place]) [F25.9]  03/25/2014       Priority: Low   .  Auditory hallucinations [R44.0]     .  Paranoid schizophrenia ([place]) [F20.0]     .  Psychosis [F29]  03/25/2014   .  Paranoia ([place]) [F22]  01/29/2014     Total Time spent with patient: 30  minutes  Subjective:    Kathy Brown is a 35 y.o. female patient presented to [facility] via EMS with complaints of nausea/vomiting and states that she was hearing voices.  HPI:  Kathy Brown 35 yr old black female in [place] with complaints of nausea and vomiting that she states is better now.  Patient reports that she called EMS because she did not feel good "I kept throwing up.  I didn't feel good."  Patient states that she does hear voices but none command.  State that she does not take her medication.  Patient admits that she has been hearing the voices but doesn't bother her and doesn't say anything bad.  Patient states that she lives with her mother and her three children.  Patient denies suicidal ideation; stating that she does not want to hurt or kill her self; and homicidal ideation; stating that she does not want to hurt or kill anyone.  Patient also denies paranoia.  Patient [facility] on 12/27/15 where she was given instructions on how to take her medication and Dr. Mortimer Fries to make he was aware.        Diagnosis: psychosis    Hospitalizations: yes "alot of times"    Outpatient Care: yes [facility]    Substance Abuse Care:no    Self-Mutilation:none    Suicidal Attempts:none    Violent Behaviors: none      Risk to Self: Suicidal Ideation: No Suicidal Intent: No Is patient at risk for suicide?: No Suicidal Plan?: No Access to Means: No What has been your use of drugs/alcohol within the last 12 months?: Denies How many times?: 0 Other Self Harm Risks: Denies Triggers for Past Attempts: None known Intentional Self Injurious Behavior: None Risk to Others: Homicidal Ideation: No Thoughts of Harm to Others: No Current Homicidal Intent: No Current Homicidal Plan: No Access to Homicidal Means: No Identified Victim: Denies History of harm to others?: No Assessment of Violence: None Noted Violent Behavior Description: Denies Does  patient have access to weapons?: No Criminal Charges Pending?: No Does patient have a court date: No Prior Inpatient Therapy: Prior Inpatient Therapy: No Prior Therapy Dates: N/A Prior Therapy Facilty/Provider(s): N/A Reason for Treatment: N/A Prior Outpatient Therapy: Prior Outpatient Therapy: No Prior Therapy Dates: N/A Prior Therapy Facilty/Provider(s): N/A Reason for Treatment: N/A Does patient have an ACCT team?: No Does patient have Intensive In-House Services?  : No Does patient have [facility] services? : No Does patient have P4CC services?: No  Past Medical History:  Past Medical History  Diagnosis Date  . Anxiety   . Asthma   . Schizophrenia ([place])   . Chronic back pain    History reviewed. No pertinent past surgical history. Family History:  History reviewed. No pertinent family history  Family Psychiatric  History: History reviewed. No pertinent family history  Social History:  History  Alcohol Use No     History  Drug  Use No    Social History   Social History  . Marital Status: Single    Spouse Name: N/A  . Number of Children: N/A  . Years of Education: N/A   Social History Main Topics  . Smoking status: Never Smoker   . Smokeless tobacco: None  . Alcohol Use: No  . Drug Use: No  . Sexual Activity: Yes   Other Topics Concern  . None   Social History Narrative   Additional Social History: Hewitt Shorts, MD (Physician) 09/19/2014 14:24      Expand All Collapse All   CSN: [id number]     Arrival date & time 09/19/14  0748 History    First MD Initiated Contact with Patient 09/19/14 0755       Chief Complaint   Patient presents with   .  Hallucinations      (Consider location/radiation/quality/duration/timing/severity/associated sxs/prior Treatment) HPI Comments: Patient presents to the ER stating that she cannot sleep. Patient comes to the ER by ambulance. She apparently called EMS herself. Patient is somewhat disorganized, not able to answer  questions appropriately. She tells me that she normally lives with her mother, but her mother has gone to [place]. She is by herself. She reports that she has not been able to sleep during the day or night for very long time. She has been hearing voices, but will not tell me what they say. She denies active homicidality or suicidality currently.  ED Notes by Kenard Gower, RN at 09/19/2014  7:49 AM     Author: Kenard Gower, RN Service: (none) Author Type: Registered Nurse    Filed: 09/19/2014  7:51 AM Note Time: 09/19/2014 7:49 AM Status: Signed    Editor: Kenard Gower, RN (Registered Nurse)      Expand All Collapse All   Per EMS, initial call for insomnia.  Pt has hx of schizophrenia.  Pt states mom, uncle and three children moved out over night and moved to [place].  Pt wandering and mentation is younger than age.  Pt found in home.  No t.v's in home.  Main furniture remains however no one else in home.  Pt called EMS on home phone.  Social consult needed.   No SI/HI voiced.  Vitals: 140/70, hr 76, cbg 140, resp 16.           Progress Notes by Robbie Lis, RN at 09/19/2014  5:13 PM     Author: Robbie Lis, RN Service: CASE MANAGEMENT Author Type: Case Manager    Filed: 09/19/2014  6:05 PM Note Time: 09/19/2014 5:13 PM Status: Signed    Editor: Robbie Lis, RN (Case Manager)       ED CM received a call from TCU RN stating she had a call from the mother confirming she was "[place]", "we had talked about this for a month" and she did "take her kids"    ED CM received a voice message left by pt's mother.  Cm returned a call to mother at 41 to  [phone].  Mother states she left pt home "because she did not want to go"  When asked who the legal guardian is, the mother replied "I'm her mother"  CM asked who receives the pt's "check" and the mother stated "I get her check"  CM informed mother if she receives pt's "check" then she is pt's legal guardian in [place].   Cm asked mother about how pt was left at home. Inquired about  food, meds, furniture and emergency contacts for pt. Mother states she left pt with "some food", "her tv and her medicine bottles that had been filled" Mother confirms she did take "the kids tv and my tv" Mother reports "before when I left she let some boys in and they stole all out things"  Confirmed pt pcp and that pt is seen at [facility].  Mother states,  "I did not know" it was not a good idea to leave pt alone.  Reports she is leaving [place] on Saturday and should be back in [place] on "sunday" Reports she has a brother in [place], "Molly Maduro" [phone]) that she will call to update him so if pt is d/c he can come to pick up pt. CM encouraged mother to call Molly Maduro to update him on pt issues and that he and/or mother will be called when pt is ready for d/c.  Mother then states she was going to call Molly Maduro "to have him take pt to his house today" CM informed mother pt will be evaluated and mother will be updated by nursing staff.             Notes by Darletta Moll, RN at 04/25/2015  9:13 AM     Author: Darletta Moll, RN Service: (none) Author Type: Registered Nurse    Filed: 04/25/2015  9:14 AM Note Time: 04/25/2015 9:13 AM Status: Signed    Editor: Darletta Moll, RN (Registered Nurse)      Expand All Collapse All   She called EMS to report she was "unable to sleep" last night, as she is out of her Trazadone.  When asked if she feels safe at home or if she feels like hurting herself, she tell us she would not feel safe and that she might hurt herself--although she expresses no plan.        ED Provider Notes by Toy Cookey, MD at 04/25/2015  9:31 AM     Author: Toy Cookey, MD Service: Emergency Medicine Author Type: Physician    Filed: 04/27/2015 11:21 AM Note Time: 04/25/2015 9:31 AM Status: Signed    Editor: Toy Cookey, MD (Physician)      Expand All Collapse All   CSN: [id number]     Arrival date & time 04/25/15   0908 History    First MD Initiated Contact with Patient 04/25/15 [phone]       Chief Complaint   Patient presents with   .  Medical Clearance      (Consider location/radiation/quality/duration/timing/severity/associated sxs/prior Treatment) HPI Comments: Patient reports she thinks her grandmother came into her house relate last night when she got up this morning.  All of her family including her mother and 3 kids were gone to [place].  Per EMS, she left for [place] on the 28th and will be returning tomorrow.  Patient denied SI or HI to EMS.  She has been out of her trazodone about 1 week      Progress Notes by Duke Salvia, LCSW at 04/25/2015 12:03 PM     Author: Duke Salvia, LCSW Service: Clinical Social Work Author Type: Social Worker    Filed: 04/25/2015 12:13 PM Note Time: 04/25/2015 12:03 PM Status: Signed    Editor: Duke Salvia, LCSW (Social Worker)      Expand All Collapse All   CSW received consult for transportation assistance. CSW met with pt who states that she does not know her address but knows she  does not live at [address] Anymore. Pt states that her grandmother helps her to get around and lives at the home, but was not home right now. Pt states that grandmother was probably at church since its Wednesday.  CSW left message for pt mother Mrs. Ferguson at [phone]. CSW also called mother's number in chart [phone] CSW left message for pt grandmother at [phone] (number listed in chart)   CSW reached pt mother, Ms. FRAZIER at [phone] who states that she is out of town in [place] and patient is staying with pt grandmother. Pt mother states that patient was suppose to stay with pt grandomther however pt wanted to stay at her house last night and before pt grandmother knew, pt was on her way to the hospital. Patient mother states that patient should be able to pick up patient after 1pm when church is out. Pt states her mother will not answer the phone  while she's in church. CSW confirmed # with pt daughter. Pt address  Is                    Allergies:  No Known Allergies  Labs:  Results for orders placed or performed during the hospital encounter of 01/05/16 (from the past 48 hour(s))  Urine rapid drug screen (hosp performed) (Not at [facility])     Status: None   Collection Time: 01/05/16  3:38 PM  Result Value Ref Range   Opiates NONE DETECTED NONE DETECTED   Cocaine NONE DETECTED NONE DETECTED   Benzodiazepines NONE DETECTED NONE DETECTED   Amphetamines NONE DETECTED NONE DETECTED   Tetrahydrocannabinol NONE DETECTED NONE DETECTED   Barbiturates NONE DETECTED NONE DETECTED    Comment:        DRUG SCREEN FOR MEDICAL PURPOSES ONLY.  IF CONFIRMATION IS NEEDED FOR ANY PURPOSE, NOTIFY LAB WITHIN 5 DAYS.        LOWEST DETECTABLE LIMITS FOR URINE DRUG SCREEN Drug Class       Cutoff (ng/mL) Amphetamine      1000 Barbiturate      200 Benzodiazepine   962 Tricyclics       836 Opiates          300 Cocaine          300 THC              50   Comprehensive metabolic panel     Status: Abnormal   Collection Time: 01/05/16  3:47 PM  Result Value Ref Range   Sodium 141 135 - 145 mmol/L   Potassium 3.9 3.5 - 5.1 mmol/L   Chloride 106 101 - 111 mmol/L   CO2 26 22 - 32 mmol/L   Glucose, Bld 89 65 - 99 mg/dL   BUN 10 6 - 20 mg/dL   Creatinine, Ser 0.58 0.44 - 1.00 mg/dL   Calcium 8.9 8.9 - 10.3 mg/dL   Total Protein 7.4 6.5 - 8.1 g/dL   Albumin 4.0 3.5 - 5.0 g/dL   AST 14 (L) 15 - 41 U/L   ALT 9 (L) 14 - 54 U/L   Alkaline Phosphatase 44 38 - 126 U/L   Total Bilirubin 0.5 0.3 - 1.2 mg/dL   GFR calc non Af Amer >60 >60 mL/min   GFR calc Af Amer >60 >60 mL/min    Comment: (NOTE) The eGFR has been calculated using the CKD EPI equation. This calculation has not been validated in all clinical situations. eGFR's persistently <60 mL/min signify possible  Chronic Kidney Disease.    Anion gap 9 5 - 15  Ethanol (ETOH)     Status:  None   Collection Time: 01/05/16  3:47 PM  Result Value Ref Range   Alcohol, Ethyl (B) <5 <5 mg/dL    Comment:        LOWEST DETECTABLE LIMIT FOR SERUM ALCOHOL IS 5 mg/dL FOR MEDICAL PURPOSES ONLY   Salicylate level     Status: None   Collection Time: 01/05/16  3:47 PM  Result Value Ref Range   Salicylate Lvl <5.4 2.8 - 30.0 mg/dL  Acetaminophen level     Status: Abnormal   Collection Time: 01/05/16  3:47 PM  Result Value Ref Range   Acetaminophen (Tylenol), Serum <10 (L) 10 - 30 ug/mL    Comment:        THERAPEUTIC CONCENTRATIONS VARY SIGNIFICANTLY. A RANGE OF 10-30 ug/mL MAY BE AN EFFECTIVE CONCENTRATION FOR MANY PATIENTS. HOWEVER, SOME ARE BEST TREATED AT CONCENTRATIONS OUTSIDE THIS RANGE. ACETAMINOPHEN CONCENTRATIONS >150 ug/mL AT 4 HOURS AFTER INGESTION AND >50 ug/mL AT 12 HOURS AFTER INGESTION ARE OFTEN ASSOCIATED WITH TOXIC REACTIONS.   CBC     Status: Abnormal   Collection Time: 01/05/16  3:47 PM  Result Value Ref Range   WBC 7.3 4.0 - 10.5 K/uL   RBC 4.06 3.87 - 5.11 MIL/uL   Hemoglobin 11.7 (L) 12.0 - 15.0 g/dL   HCT 35.4 (L) 36.0 - 46.0 %   MCV 87.2 78.0 - 100.0 fL   MCH 28.8 26.0 - 34.0 pg   MCHC 33.1 30.0 - 36.0 g/dL   RDW 14.4 11.5 - 15.5 %   Platelets 253 150 - 400 K/uL    Current Facility-Administered Medications  Medication Dose Route Frequency Provider Last Rate Last Dose  . benztropine (COGENTIN) tablet 0.5 mg  0.5 mg Oral BID [place] Lions, PA-C      . haloperidol (HALDOL) tablet 2 mg  2 mg Oral QHS [place] Lions, PA-C      . traZODone (DESYREL) tablet 25 mg  25 mg Oral QHS PRN  Lions, PA-C       Current Outpatient Prescriptions  Medication Sig Dispense Refill  . benztropine (COGENTIN) 0.5 MG tablet Take 0.5 mg by mouth 2 (two) times daily.    . haloperidol (HALDOL) 2 MG tablet Take 1 tablet (2 mg total) by mouth at bedtime. 5 tablet 0  . haloperidol decanoate (HALDOL DECANOATE) 100 MG/ML injection Inject 100  mg into the muscle every 30 (thirty) days.    . traZODone (DESYREL) 50 MG tablet Take 0.5 tablets (25 mg total) by mouth at bedtime as needed for sleep. 10 tablet 0    Musculoskeletal: Strength & Muscle Tone: within normal limits Gait & Station: normal Patient leans: N/A  Psychiatric Specialty Exam: Review of Systems  Constitutional: Negative for fever, chills, weight loss, malaise/fatigue and diaphoresis.  HENT: Negative for congestion, ear discharge, ear pain, hearing loss, nosebleeds, sore throat and tinnitus.        Speech impediment  Eyes: Negative.  Negative for blurred vision, double vision, photophobia, pain, discharge and redness.  Respiratory: Negative.  Negative for cough, hemoptysis, sputum production, shortness of breath, wheezing and stridor.   Cardiovascular: Negative.  Negative for chest pain, palpitations, orthopnea, claudication, leg swelling and PND.  Genitourinary: Negative for dysuria, urgency, frequency, hematuria and flank pain.  Neurological: Negative.  Negative for dizziness, tingling, tremors, sensory change, speech change, focal weakness, seizures, loss of consciousness, weakness and  headaches.  Psychiatric/Behavioral: Negative for depression, suicidal ideas, hallucinations, memory loss and substance abuse. The patient is nervous/anxious and has insomnia.        Cannot tolerate being left home alone Mother is payee  All other systems reviewed and are negative.   Blood pressure 121/65, pulse 92, resp. rate 20, last menstrual period 11/28/2015, SpO2 100 %.There is no weight on file to calculate BMI.  General Appearance: Disheveled  Eye Sport and exercise psychologist::  Fair  Speech:  Garbled  Volume:  Normal  Mood:  Variable  Affect:  Full Range  Thought Process:  Goal Directed ready to go home-Mom is back?  Orientation:  Negative  Thought Content:  Ready to go home  Suicidal Thoughts:  No  Homicidal Thoughts:  No  Memory:  Limited  Judgement:  Impaired by her illness   Insight:  Lacking  Psychomotor Activity:  Restlessness  Concentration:  Intact for visit  Recall:  [place]  Language: Fair  Akathisia:  Negative  Handed:  Right  AIMS (if indicated):  Not tested  Assets:  Financial Resources/Insurance Housing  ADL's:  Limited tolerance for being left home alone  Cognition: Limited cannot find record of IQ SSD report  Sleep:  C/o difiiculty especially when left alone   Treatment Plan Summary: Plan return to home and routine care.Request Social Service evaluation mother continuously denying Guardianship yet is payee  Disposition: No evidence of imminent risk to self or others at present.   Patient does not meet criteria for psychiatric inpatient admission.  Darlyne Russian, PA-C 01/05/2016 10:31 PM

## 2016-01-09 ENCOUNTER — Emergency Department (HOSPITAL_COMMUNITY)
Admission: EM | Admit: 2016-01-09 | Discharge: 2016-01-09 | Disposition: A | Discharge status: Home / Self Care | Payer: Medicare Other | Attending: Emergency Medicine | Admitting: Emergency Medicine

## 2016-01-09 ENCOUNTER — Encounter (HOSPITAL_COMMUNITY): Payer: Self-pay | Admitting: Emergency Medicine

## 2016-01-09 DIAGNOSIS — Z609 Problem related to social environment, unspecified: Secondary | ICD-10-CM | POA: Diagnosis not present

## 2016-01-09 DIAGNOSIS — R1084 Generalized abdominal pain: Secondary | ICD-10-CM | POA: Insufficient documentation

## 2016-01-09 DIAGNOSIS — J45909 Unspecified asthma, uncomplicated: Secondary | ICD-10-CM | POA: Diagnosis not present

## 2016-01-09 DIAGNOSIS — R109 Unspecified abdominal pain: Secondary | ICD-10-CM | POA: Diagnosis present

## 2016-01-09 DIAGNOSIS — Z659 Problem related to unspecified psychosocial circumstances: Secondary | ICD-10-CM

## 2016-01-09 DIAGNOSIS — G8929 Other chronic pain: Secondary | ICD-10-CM | POA: Diagnosis not present

## 2016-01-09 DIAGNOSIS — Z79899 Other long term (current) drug therapy: Secondary | ICD-10-CM | POA: Diagnosis not present

## 2016-01-09 DIAGNOSIS — Z3202 Encounter for pregnancy test, result negative: Secondary | ICD-10-CM | POA: Diagnosis not present

## 2016-01-09 DIAGNOSIS — F419 Anxiety disorder, unspecified: Secondary | ICD-10-CM | POA: Insufficient documentation

## 2016-01-09 LAB — COMPREHENSIVE METABOLIC PANEL
ALT: 9 U/L — ABNORMAL LOW (ref 14–54)
ANION GAP: 9 (ref 5–15)
AST: 13 U/L — AB (ref 15–41)
Albumin: 4.3 g/dL (ref 3.5–5.0)
Alkaline Phosphatase: 49 U/L (ref 38–126)
BUN: 9 mg/dL (ref 6–20)
CHLORIDE: 100 mmol/L — AB (ref 101–111)
CO2: 28 mmol/L (ref 22–32)
Calcium: 10.3 mg/dL (ref 8.9–10.3)
Creatinine, Ser: 0.69 mg/dL (ref 0.44–1.00)
Glucose, Bld: 96 mg/dL (ref 65–99)
POTASSIUM: 4.1 mmol/L (ref 3.5–5.1)
Sodium: 137 mmol/L (ref 135–145)
Total Bilirubin: 0.5 mg/dL (ref 0.3–1.2)
Total Protein: 8.3 g/dL — ABNORMAL HIGH (ref 6.5–8.1)

## 2016-01-09 LAB — CBC WITH DIFFERENTIAL/PLATELET
Basophils Absolute: 0 10*3/uL (ref 0.0–0.1)
Basophils Relative: 1 %
EOS PCT: 1 %
Eosinophils Absolute: 0 10*3/uL (ref 0.0–0.7)
HCT: 40.3 % (ref 36.0–46.0)
Hemoglobin: 12.9 g/dL (ref 12.0–15.0)
LYMPHS PCT: 24 %
Lymphs Abs: 1.5 10*3/uL (ref 0.7–4.0)
MCH: 28.9 pg (ref 26.0–34.0)
MCHC: 32 g/dL (ref 30.0–36.0)
MCV: 90.2 fL (ref 78.0–100.0)
MONO ABS: 0.4 10*3/uL (ref 0.1–1.0)
Monocytes Relative: 6 %
Neutro Abs: 4.2 10*3/uL (ref 1.7–7.7)
Neutrophils Relative %: 68 %
PLATELETS: 240 10*3/uL (ref 150–400)
RBC: 4.47 MIL/uL (ref 3.87–5.11)
RDW: 14.4 % (ref 11.5–15.5)
WBC: 6.1 10*3/uL (ref 4.0–10.5)

## 2016-01-09 LAB — LIPASE, BLOOD: LIPASE: 23 U/L (ref 11–51)

## 2016-01-09 LAB — URINALYSIS, ROUTINE W REFLEX MICROSCOPIC
Bilirubin Urine: NEGATIVE
GLUCOSE, UA: NEGATIVE mg/dL
HGB URINE DIPSTICK: NEGATIVE
KETONES UR: NEGATIVE mg/dL
Leukocytes, UA: NEGATIVE
Nitrite: NEGATIVE
PROTEIN: NEGATIVE mg/dL
Specific Gravity, Urine: 1.003 — ABNORMAL LOW (ref 1.005–1.030)
pH: 6.5 (ref 5.0–8.0)

## 2016-01-09 LAB — POC URINE PREG, ED: PREG TEST UR: NEGATIVE

## 2016-01-09 NOTE — Progress Notes (Addendum)
Left a voice message for pt's mother at [phone]  Cm left the numbers for DSS and urban ministries  Added to d/c instructions [facility] Call to get assist with you medications and assist with the medication co pays [address] [place] [id number] [phone]
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
## 2016-01-09 NOTE — Progress Notes (Signed)
Pt given a list of [place] crisis resources for bills to give to her grandmother Pt states she not sure mother can read well "but grandmomma has glasses"

## 2016-01-09 NOTE — Discharge Instructions (Signed)
You do not have signs of a serious medical illness today. We have left to resources above to help with your utilities issues at home. Please call to arrange for assistance. Return without fail for worsening symptoms including vomiting unable to keep down food or fluids, worsening pain, fevers, or any other symptoms concerning to you.  Abdominal Pain, Adult Many things can cause abdominal pain. Usually, abdominal pain is not caused by a disease and will improve without treatment. It can often be observed and treated at home. Your health care provider will do a physical exam and possibly order blood tests and X-rays to help determine the seriousness of your pain. However, in many cases, more time must pass before a clear cause of the pain can be found. Before that point, your health care provider may not know if you need more testing or further treatment. HOME CARE INSTRUCTIONS Monitor your abdominal pain for any changes. The following actions may help to alleviate any discomfort you are experiencing:  Only take over-the-counter or prescription medicines as directed by your health care provider.  Do not take laxatives unless directed to do so by your health care provider.  Try a clear liquid diet (broth, tea, or water) as directed by your health care provider. Slowly move to a bland diet as tolerated. SEEK MEDICAL CARE IF:  You have unexplained abdominal pain.  You have abdominal pain associated with nausea or diarrhea.  You have pain when you urinate or have a bowel movement.  You experience abdominal pain that wakes you in the night.  You have abdominal pain that is worsened or improved by eating food.  You have abdominal pain that is worsened with eating fatty foods.  You have a fever. SEEK IMMEDIATE MEDICAL CARE IF:  Your pain does not go away within 2 hours.  You keep throwing up (vomiting).  Your pain is felt only in portions of the abdomen, such as the right side or the left  lower portion of the abdomen.  You pass bloody or black tarry stools. MAKE SURE YOU:  Understand these instructions.  Will watch your condition.  Will get help right away if you are not doing well or get worse.   This information is not intended to replace advice given to you by your health care provider. Make sure you discuss any questions you have with your health care provider.   Document Released: 07/23/2005 Document Revised: 07/04/2015 Document Reviewed: 06/22/2013 Elsevier Interactive Patient Education [employer]2016 Elsevier Inc.

## 2016-01-09 NOTE — Progress Notes (Signed)
CSW received call from Nurse CM regarding patient needing assistance with water assistance. CSW provided two contact places to Nurse CM for patient to contact: 1. DSS and 2. [place] [facility].  Kathy Brown, LCSWA [phone] ED CSW 01/09/2016 3:31 PM
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
## 2016-01-15 DIAGNOSIS — F209 Schizophrenia, unspecified: Secondary | ICD-10-CM | POA: Diagnosis not present

## 2016-01-17 DIAGNOSIS — Z79899 Other long term (current) drug therapy: Secondary | ICD-10-CM

## 2016-01-17 NOTE — Congregational Nurse Program (Signed)
Congregational Nurse Program Note  Date of Encounter: 01/17/2016  Past Medical History: Past Medical History  Diagnosis Date  . Anxiety   . Asthma   . Schizophrenia ([place])   . Chronic back pain     Encounter Details:     CNP Questionnaire - 01/17/16 1454    Patient Demographics   Is this a new or existing patient? Existing   Patient is considered a/an Not Applicable   Race African-American/Black   Patient Assistance   Location of Patient [place]   Patient's financial/insurance status Medicaid   Uninsured Patient No   Patient referred to apply for the following financial assistance Not Applicable   Food insecurities addressed Not Applicable   Transportation assistance No   Type of Assistance Other   Assistance securing medications No   Type of Assistance Other   Educational health offerings Behavioral health;Other;Medications   Encounter Details   Primary purpose of visit Education/Health Concerns;Navigating the Healthcare System   Was an Emergency Department visit averted? Not Applicable   Does patient have a medical provider? Yes   Patient referred to Not Applicable  RN called left message for social worker, Raliegh Ip. Edsel Petrin to inform her of mother saying she left  her MCD papers in [place].  RN asked pt. to have her mother come to talk wtih RN.  SW not present today   Was a mental health screening completed? (GAINS tool) No   Was a mental health referral made? No   Does patient have dental issues? Yes   Was a dental referral made? No  has no way to get to dentist nor PCP for a referral.   Does patient have vision issues? No   Since previous encounter, have you referred patient for abnormal blood pressure that resulted in a new diagnosis or medication change? No   Since previous encounter, have you referred patient for abnormal blood glucose that resulted in a new diagnosis or medication change? No      01/17/16 met with patient, per her request, wrote  out instructions on how to take medications on a piece of paper, cut her Trazodone tablets in half per directions.  Provided medication management education with patient, she verbalized understanding.  Suggested patient get a pill box to assist with medication management. Patient picked up RX refill on 01/14/16, as of today patient had the following quantity of medications: Benztropine 0.53m (56 remaining out of 60) Haloperidol 2 mg (28 remaining out of 30) Trazodone 533m(13 remaining out of 30) [phone]

## 2016-01-24 ENCOUNTER — Encounter (HOSPITAL_COMMUNITY): Payer: Self-pay

## 2016-01-24 ENCOUNTER — Emergency Department (HOSPITAL_COMMUNITY)
Admission: EM | Admit: 2016-01-24 | Discharge: 2016-01-24 | Disposition: A | Discharge status: Home / Self Care | Payer: Medicare Other | Attending: Emergency Medicine | Admitting: Emergency Medicine

## 2016-01-24 DIAGNOSIS — G8929 Other chronic pain: Secondary | ICD-10-CM | POA: Insufficient documentation

## 2016-01-24 DIAGNOSIS — J45909 Unspecified asthma, uncomplicated: Secondary | ICD-10-CM | POA: Diagnosis not present

## 2016-01-24 DIAGNOSIS — Z79899 Other long term (current) drug therapy: Secondary | ICD-10-CM | POA: Diagnosis not present

## 2016-01-24 DIAGNOSIS — Z72811 Adult antisocial behavior: Secondary | ICD-10-CM | POA: Insufficient documentation

## 2016-01-24 DIAGNOSIS — F419 Anxiety disorder, unspecified: Secondary | ICD-10-CM | POA: Diagnosis not present

## 2016-01-24 DIAGNOSIS — Z046 Encounter for general psychiatric examination, requested by authority: Secondary | ICD-10-CM | POA: Diagnosis present

## 2016-01-24 LAB — COMPREHENSIVE METABOLIC PANEL
ALBUMIN: 4.1 g/dL (ref 3.5–5.0)
ALT: 10 U/L — AB (ref 14–54)
AST: 13 U/L — AB (ref 15–41)
Alkaline Phosphatase: 51 U/L (ref 38–126)
Anion gap: 7 (ref 5–15)
BUN: 6 mg/dL (ref 6–20)
CO2: 25 mmol/L (ref 22–32)
CREATININE: 0.75 mg/dL (ref 0.44–1.00)
Calcium: 9.4 mg/dL (ref 8.9–10.3)
Chloride: 108 mmol/L (ref 101–111)
GFR calc Af Amer: >60 mL/min (ref >60)
GFR calc non Af Amer: >60 mL/min (ref >60)
Glucose, Bld: 101 mg/dL — ABNORMAL HIGH (ref 65–99)
Potassium: 3.8 mmol/L (ref 3.5–5.1)
SODIUM: 140 mmol/L (ref 135–145)
Total Bilirubin: 0.4 mg/dL (ref 0.3–1.2)
Total Protein: 7.9 g/dL (ref 6.5–8.1)

## 2016-01-24 LAB — CBC WITH DIFFERENTIAL/PLATELET
BASOS ABS: 0 10*3/uL (ref 0.0–0.1)
Basophils Relative: 1 %
EOS PCT: 1 %
Eosinophils Absolute: 0.1 10*3/uL (ref 0.0–0.7)
HCT: 39.7 % (ref 36.0–46.0)
HEMOGLOBIN: 13.2 g/dL (ref 12.0–15.0)
LYMPHS ABS: 1.7 10*3/uL (ref 0.7–4.0)
LYMPHS PCT: 33 %
MCH: 28.9 pg (ref 26.0–34.0)
MCHC: 33.2 g/dL (ref 30.0–36.0)
MCV: 86.9 fL (ref 78.0–100.0)
Monocytes Absolute: 0.3 10*3/uL (ref 0.1–1.0)
Monocytes Relative: 6 %
NEUTROS ABS: 3.2 10*3/uL (ref 1.7–7.7)
NEUTROS PCT: 59 %
PLATELETS: 279 10*3/uL (ref 150–400)
RBC: 4.57 MIL/uL (ref 3.87–5.11)
RDW: 14.2 % (ref 11.5–15.5)
WBC: 5.3 10*3/uL (ref 4.0–10.5)

## 2016-01-24 LAB — LIPASE, BLOOD: LIPASE: 20 U/L (ref 11–51)

## 2016-01-24 LAB — SALICYLATE LEVEL: Salicylate Lvl: <4 mg/dL (ref 2.8–30.0)

## 2016-01-24 LAB — ACETAMINOPHEN LEVEL

## 2016-01-24 NOTE — ED Provider Notes (Signed)
CSN: [id number]     Arrival date & time 01/24/16  1142 History   First MD Initiated Contact with Patient 01/24/16 1338     Chief Complaint  Patient presents with  . Psychiatric Evaluation  . Social Problem      (Consider location/radiation/quality/duration/timing/severity/associated sxs/prior Treatment) HPI Charman Blasco is a 35 y.o. female with history of anxiety, asthma and schizophrenia comes in for evaluation of social problem. Patient reports she has schizophrenia, has not taken her medications today but she does not know howTo take them. Reports that her mother typically stays with her and gives her her medications, but her mother left her at home alone and she has been unable to take her medications. She is currently exhibiting tangential thinking. Talking about a road trip to [place] and spring break vacation and her child having a UTI She denies any alcohol use, illicit substance use or other recreational drug use. Denies any headache, vision changes chest pain or short of breath, abdominal pain or any other medical symptoms. Denies any suicidal or homicidal ideations, no auditory or visual hallucinations.  Past Medical History  Diagnosis Date  . Anxiety   . Asthma   . Schizophrenia (HCC)   . Chronic back pain    History reviewed. No pertinent past surgical history. History reviewed. No pertinent family history. Social History  Substance Use Topics  . Smoking status: Never Smoker   . Smokeless tobacco: None  . Alcohol Use: No   OB History    No data available     Review of Systems A 10 point review of systems was completed and was negative except for pertinent positives and negatives as mentioned in the history of present illness     Allergies  Review of patient's allergies indicates no known allergies.  Home Medications   Prior to Admission medications   Medication Sig Start Date End Date Taking? Authorizing Provider  benztropine (COGENTIN) 0.5 MG tablet  Take 0.5 mg by mouth 2 (two) times daily.    Historical Provider, MD  haloperidol (HALDOL) 2 MG tablet Take 1 tablet (2 mg total) by mouth at bedtime. 09/20/14   Shuvon B Rankin, NP  haloperidol decanoate (HALDOL DECANOATE) 100 MG/ML injection Inject 100 mg into the muscle every 30 (thirty) days. 05/08/14   Historical Provider, MD  traZODone (DESYREL) 50 MG tablet Take 0.5 tablets (25 mg total) by mouth at bedtime as needed for sleep. 04/25/15   Toy Cookey, MD   BP 108/83 mmHg  Pulse 81  Temp(Src) 97.5 F (36.4 C) (Oral)  Resp 16  SpO2 98%  LMP 11/28/2015 Physical Exam  Constitutional: She is oriented to person, place, and time. She appears well-developed and well-nourished.  HENT:  Head: Normocephalic and atraumatic.  Mouth/Throat: Oropharynx is clear and moist.  Eyes: Conjunctivae are normal. Pupils are equal, round, and reactive to light. Right eye exhibits no discharge. Left eye exhibits no discharge. No scleral icterus.  Neck: Neck supple.  Cardiovascular: Normal rate, regular rhythm and normal heart sounds.   Pulmonary/Chest: Effort normal and breath sounds normal. No respiratory distress. She has no wheezes. She has no rales.  Abdominal: Soft. There is no tenderness.  Musculoskeletal: She exhibits no tenderness.  Neurological: She is alert and oriented to person, place, and time.  Cranial Nerves II-XII grossly intact  Skin: Skin is warm and dry. No rash noted.  Psychiatric: She has a normal mood and affect.  Patient has very pressured speech, tangential thinking. She does seem somewhat  anxious.  Nursing note and vitals reviewed.   ED Course  Procedures (including critical care time) Labs Review Labs Reviewed  CBC WITH DIFFERENTIAL/PLATELET  ACETAMINOPHEN LEVEL  COMPREHENSIVE METABOLIC PANEL  LIPASE, BLOOD  URINALYSIS, ROUTINE W REFLEX MICROSCOPIC (NOT AT [facility])  PREGNANCY, URINE  SALICYLATE LEVEL    Imaging Review No results found. I have personally reviewed and  evaluated these images and lab results as part of my medical decision-making.   EKG Interpretation None     Meds given in ED:  Medications - No data to display  New Prescriptions   No medications on file   Filed Vitals:   01/24/16 1313  BP: 108/83  Pulse: 81  Temp: 97.5 F (36.4 C)  TempSrc: Oral  Resp: 16  SpO2: 98%    MDM  Janean SarkLatasha Uppal is a 35 y.o. female history of schizophrenia comes in for evaluation of anxiety with associated psychiatric eval. It sounds like she has not been compliant with medications. She denies any other medical problems. She does have some pressured speech with tangential thinking, but overall appears well. No suicidal or homicidal ideations. No active auditory or visual hallucinations. She has a benign physical exam. Vital signs are normal and she is afebrile. Pending basic labs and TTS evaluation. TTS has cleared patient for discharge and given her outpatient resources. Patient care signed out to oncoming provider PA-C [place].  Objective findings, I feel patient can be medically cleared and discharged to follow up on outpatient basis. Final diagnoses:  Anxiety      Joycie PeekBenjamin Jamal Haskin, PA-C 01/24/16 1545  Lorre NickAnthony Allen, MD 01/28/16 [phone]

## 2016-01-24 NOTE — Discharge Instructions (Signed)
For your ongoing behavioral health needs you are advised to follow up with [facility].  New patients are seen at their walk-in clinic.  Walk-in hours are Monday - Friday from 8:00 am - 12:00 pm, and from 1:00 pm - 3:00 pm.  Walk-in patients are seen on a first come, first served basis, so try to arrive as early as possible for the best chance of being seen the same day.  There is an initial fee of $22.50: ° °     [facility] °     [address] °     [place], [place] [address] °     [phone] °

## 2016-01-24 NOTE — ED Notes (Signed)
When this RN went to Triage Pt, she was in the restroom.

## 2016-01-24 NOTE — ED Notes (Signed)
Pt presents to the ED because she is scared to be home alone x 1.5 weeks.  Pt is scared of her children (ages 5711, 2810 and 5).  Sts "they won't listen to me.  They talk back to me."  Hx of anxiety and schizophrenia.  Pt lives w/ her mother and her kids.  Pt reports that she did not take her medication today.

## 2016-01-24 NOTE — BH Assessment (Addendum)
Tele Assessment Note   Kathy Brown is an 35 y.o. female. Pt denies SI/HI. Pt reports AVH when she does not take her medication. Pt is diagnosed with Schizophrenia. Pt is prescribed Cogentin, Trazodone, and Haloperidal from [place]. Pt has not received inpatient mental health treatment. Pt resides with her mother and 3 children. Pt states she came to the ED today because "she is scared to be at home by herself and because her children do not listen to her." Pt states "my kids yell at me." Pt states she needs helps parenting her children. Pt's mother Delton Coombes is her guardian and assists the client with parenting her children. Pt's speech was tangential at times.   Writer consulted with Julieanne Cotton, NP. Per Julieanne Cotton, NP Pt does not meet inpatient criteria. Pt provided with [facility].   Diagnosis:  F20.9 Schizophrenia  Past Medical History:  Past Medical History  Diagnosis Date  . Anxiety   . Asthma   . Schizophrenia (HCC)   . Chronic back pain     History reviewed. No pertinent past surgical history.  Family History: History reviewed. No pertinent family history.  Social History:  reports that she has never smoked. She does not have any smokeless tobacco history on file. She reports that she does not drink alcohol or use illicit drugs.  Additional Social History:  Alcohol / Drug Use Pain Medications: Pt denies  CIWA: CIWA-Ar BP: 108/83 mmHg Pulse Rate: 81 COWS:    PATIENT STRENGTHS: (choose at least two) Communication skills Supportive family/friends  Allergies: No Known Allergies  Home Medications:  (Not in a hospital admission)  OB/GYN Status:  Patient's last menstrual period was 11/28/2015.  General Assessment Data Location of Assessment: [facility] TTS Assessment: In system Is this a Tele or Face-to-Face Assessment?: Tele Assessment Is this an Initial Assessment or a Re-assessment for this encounter?: Initial Assessment Marital  status: Single Maiden name: NA Is patient pregnant?: No Pregnancy Status: No Living Arrangements: Children, Parent Can pt return to current living arrangement?: Yes Admission Status: Voluntary Is patient capable of signing voluntary admission?: Yes Referral Source: Self/Family/Friend Insurance type: Medicare     Crisis Care Plan Living Arrangements: Children, Parent Legal Guardian: Mother Name of Psychiatrist: Transport planner Name of Therapist: NA  Education Status Is patient currently in school?: No Current Grade: NA Highest grade of school patient has completed: 12 Name of school: NA Contact person: NA  Risk to self with the past 6 months Suicidal Ideation: No Has patient been a risk to self within the past 6 months prior to admission? : No Suicidal Intent: No Has patient had any suicidal intent within the past 6 months prior to admission? : No Is patient at risk for suicide?: No Suicidal Plan?: No Has patient had any suicidal plan within the past 6 months prior to admission? : No Access to Means: No What has been your use of drugs/alcohol within the last 12 months?: NA Previous Attempts/Gestures: No How many times?: 0 Other Self Harm Risks: NA Triggers for Past Attempts: None known Intentional Self Injurious Behavior: None Family Suicide History: No Recent stressful life event(s): Other (Comment) (children) Persecutory voices/beliefs?: No Depression: Yes Depression Symptoms: Loss of interest in usual pleasures, Feeling worthless/self pity Substance abuse history and/or treatment for substance abuse?: No Suicide prevention information given to non-admitted patients: Not applicable  Risk to Others within the past 6 months Homicidal Ideation: No Does patient have any lifetime risk of violence toward others beyond the  six months prior to admission? : No Thoughts of Harm to Others: No Current Homicidal Intent: No Current Homicidal Plan: No Access to Homicidal Means:  No Identified Victim: NA History of harm to others?: No Assessment of Violence: None Noted Violent Behavior Description: NA Does patient have access to weapons?: No Criminal Charges Pending?: No Does patient have a court date: No Is patient on probation?: No  Psychosis Hallucinations: Auditory Delusions: None noted  Mental Status Report Appearance/Hygiene: Unremarkable Eye Contact: Fair Motor Activity: Freedom of movement Speech: Tangential Level of Consciousness: Alert Mood: Helpless Affect: Appropriate to circumstance Anxiety Level: Moderate Thought Processes: Tangential Judgement: Impaired Orientation: Person, Place, Time, Situation Obsessive Compulsive Thoughts/Behaviors: None  Cognitive Functioning Concentration: Normal Memory: Recent Intact, Remote Intact IQ: Average Insight: Poor Impulse Control: Poor Appetite: Fair Weight Loss: 0 Weight Gain: 0 Sleep: No Change Total Hours of Sleep: 8 Vegetative Symptoms: None  ADLScreening [facility]) Patient's cognitive ability adequate to safely complete daily activities?: No Patient able to express need for assistance with ADLs?: Yes Independently performs ADLs?: Yes (appropriate for developmental age)  Prior Inpatient Therapy Prior Inpatient Therapy: No Prior Therapy Dates: N/A Prior Therapy Facilty/Provider(s): N/A Reason for Treatment: N/A  Prior Outpatient Therapy Prior Outpatient Therapy: Yes Prior Therapy Dates: 2017 Prior Therapy Facilty/Provider(s): [facility] Reason for Treatment: NA Does patient have an ACCT team?: No Does patient have Intensive In-House Services?  : No Does patient have [facility] services? : No Does patient have P4CC services?: No  ADL Screening (condition at time of admission) Patient's cognitive ability adequate to safely complete daily activities?: No Is the patient deaf or have difficulty hearing?: No Does the patient have difficulty seeing, even when wearing  glasses/contacts?: No Does the patient have difficulty concentrating, remembering, or making decisions?: Yes Patient able to express need for assistance with ADLs?: Yes Does the patient have difficulty dressing or bathing?: No Independently performs ADLs?: Yes (appropriate for developmental age) Does the patient have difficulty walking or climbing stairs?: No Weakness of Legs: None Weakness of Arms/Hands: None       Abuse/Neglect Assessment (Assessment to be complete while patient is alone) Physical Abuse: Denies Verbal Abuse: Denies Sexual Abuse: Denies Exploitation of patient/patient's resources: Denies Self-Neglect: Denies Values / Beliefs Cultural Requests During Hospitalization: None Spiritual Requests During Hospitalization: None   Advance Directives (For Healthcare) Does patient have an advance directive?: No Would patient like information on creating an advanced directive?: No - patient declined information    Additional Information 1:1 In Past 12 Months?: No CIRT Risk: No Elopement Risk: No Does patient have medical clearance?: Yes     Disposition:  Disposition Initial Assessment Completed for this Encounter: Yes Disposition of Patient: Other dispositions, Outpatient treatment Type of outpatient treatment: Adult Other disposition(s): Other (Comment) ([facility] to [facility])  Malachi Kinzler D 01/24/2016 2:47 PM

## 2016-01-24 NOTE — ED Provider Notes (Signed)
Patient signed out to me at shift change by Mayme GentaBen Cartner, PA-C.  35 year old female presents with schizophrenia and medication noncompliance. TTS has evaluated and cleared the patient for discharge. Patient given [facility] resources.  Labs pending. Anticipate discharge home if normal.  CBC negative for leukocytosis or anemia. CMP unremarkable. Lipase within normal limits. Acetaminophen and salicylate negative.  Patient is non-toxic and well-appearing, feel she is stable for discharge at this time. Patient to follow-up as indicated in her discharge paperwork. Return precautions discussed. Patient verbalizes her understanding and is in agreement with plan.  BP 108/83 mmHg  Pulse 81  Temp(Src) 97.5 F (36.4 C) (Oral)  Resp 16  SpO2 98%  LMP 11/28/2015   Mady GemmaElizabeth C Westfall, PA-C 01/24/16 1603  Lorre NickAnthony Allen, MD 01/28/16 [phone]

## 2016-02-12 DIAGNOSIS — F209 Schizophrenia, unspecified: Secondary | ICD-10-CM | POA: Diagnosis not present

## 2016-03-11 DIAGNOSIS — F209 Schizophrenia, unspecified: Secondary | ICD-10-CM | POA: Diagnosis not present

## 2016-04-08 DIAGNOSIS — F209 Schizophrenia, unspecified: Secondary | ICD-10-CM | POA: Diagnosis not present

## 2016-05-06 DIAGNOSIS — F209 Schizophrenia, unspecified: Secondary | ICD-10-CM | POA: Diagnosis not present

## 2016-07-03 DIAGNOSIS — F209 Schizophrenia, unspecified: Secondary | ICD-10-CM | POA: Diagnosis not present

## 2016-07-31 DIAGNOSIS — F209 Schizophrenia, unspecified: Secondary | ICD-10-CM | POA: Diagnosis not present

## 2016-08-25 ENCOUNTER — Emergency Department (HOSPITAL_COMMUNITY)
Admission: EM | Admit: 2016-08-25 | Discharge: 2016-08-25 | Disposition: A | Discharge status: Home / Self Care | Payer: Medicare Other | Attending: Physician Assistant | Admitting: Physician Assistant

## 2016-08-25 ENCOUNTER — Encounter (HOSPITAL_COMMUNITY): Payer: Self-pay | Admitting: Emergency Medicine

## 2016-08-25 DIAGNOSIS — Z760 Encounter for issue of repeat prescription: Secondary | ICD-10-CM | POA: Insufficient documentation

## 2016-08-25 DIAGNOSIS — Z79899 Other long term (current) drug therapy: Secondary | ICD-10-CM | POA: Diagnosis not present

## 2016-08-25 DIAGNOSIS — J45909 Unspecified asthma, uncomplicated: Secondary | ICD-10-CM | POA: Diagnosis not present

## 2016-08-25 MED ORDER — MELATONIN 5 MG PO TABS: 5.0000 mg | ORAL_TABLET | Freq: Once | ORAL | 0 refills | Status: AC

## 2016-08-25 MED ORDER — HYDROXYZINE HCL 25 MG PO TABS: 25.0000 mg | ORAL_TABLET | ORAL | 0 refills | Status: AC

## 2016-08-25 MED ORDER — BENZTROPINE MESYLATE 0.5 MG PO TABS: 0.5000 mg | ORAL_TABLET | Freq: Two times a day (BID) | ORAL | 0 refills | Status: DC

## 2016-08-25 NOTE — Discharge Instructions (Signed)
Please read attached information. If you experience any new or worsening signs or symptoms please return to the emergency room for evaluation. Please follow-up with your primary care provider or specialist as discussed. Please use medication prescribed only as directed and discontinue taking if you have any concerning signs or symptoms.   °

## 2016-08-25 NOTE — ED Provider Notes (Signed)
[facility]-EMERGENCY DEPT Provider Note    By signing my name below, I, Earmon PhoenixJennifer Waddell, attest that this documentation has been prepared under the direction and in the presence of Mohawk IndustriesJeff Grainne Knights, PA-C. Electronically Signed: Earmon PhoenixJennifer Waddell, ED Scribe. 08/25/16. 7:45 PM.   History   Chief Complaint Chief Complaint  Patient presents with  . Medication Refill    The history is provided by the patient and medical records. No language interpreter was used.    HPI Comments:  Kathy Brown is an obese 35 y.o. female with PMHx of schizophrenia who presents to the Emergency Department wanting refills on her Cogentin, Melatonin and Hydroxyzine. She states she has been out for about one month. She states the refills are at the pharmacy but she does not have any money to pay for them. She then states she does not have any refills for the medications. She denies any issues with the medications. She denies any symptoms.    Past Medical History:  Diagnosis Date  . Anxiety   . Asthma   . Chronic back pain   . Schizophrenia [facility](HCC)     Patient Active Problem List   Diagnosis Date Noted  . Auditory hallucinations   . Paranoid schizophrenia (HCC)   . Schizoaffective disorder (HCC) 03/25/2014  . Psychosis 03/25/2014  . Paranoia (HCC) 01/29/2014    History reviewed. No pertinent surgical history.  OB History    No data available       Home Medications    Prior to Admission medications   Medication Sig Start Date End Date Taking? Authorizing Provider  benztropine (COGENTIN) 0.5 MG tablet Take 1 tablet (0.5 mg total) by mouth 2 (two) times daily. 08/25/16   Eyvonne MechanicJeffrey Laylonie Marzec, PA-C  haloperidol (HALDOL) 2 MG tablet Take 1 tablet (2 mg total) by mouth at bedtime. 09/20/14   Shuvon B Rankin, NP  haloperidol decanoate (HALDOL DECANOATE) 100 MG/ML injection Inject 100 mg into the muscle every 30 (thirty) days. 05/08/14   Historical Provider, MD  hydrOXYzine (ATARAX/VISTARIL) 25 MG tablet Take 1  tablet (25 mg total) by mouth 1 day or 1 dose. 08/25/16 08/26/16  Eyvonne MechanicJeffrey Micaylah Bertucci, PA-C  Melatonin 5 MG TABS Take 1 tablet (5 mg total) by mouth once. 08/25/16 08/25/16  Eyvonne MechanicJeffrey Adler Chartrand, PA-C  traZODone (DESYREL) 50 MG tablet Take 0.5 tablets (25 mg total) by mouth at bedtime as needed for sleep. 04/25/15   Toy CookeyMegan Docherty, MD    Family History History reviewed. No pertinent family history.  Social History Social History  Substance Use Topics  . Smoking status: Never Smoker  . Smokeless tobacco: Never Used  . Alcohol use No     Allergies   Review of patient's allergies indicates no known allergies.   Review of Systems Review of Systems A complete 10 system review of systems was obtained and all systems are negative except as noted in the HPI and PMH.    Physical Exam Updated Vital Signs BP 132/82 (BP Location: Right Arm)   Pulse 105   Temp 98.5 F (36.9 C) (Oral)   Resp 16   SpO2 100%   Physical Exam  Constitutional: She is oriented to person, place, and time. She appears well-developed and well-nourished.  HENT:  Head: Normocephalic and atraumatic.  Neck: Normal range of motion.  Cardiovascular: Normal rate.   Pulmonary/Chest: Effort normal.  Musculoskeletal: Normal range of motion.  Neurological: She is alert and oriented to person, place, and time.  Skin: Skin is warm and dry.  Nursing note and  vitals reviewed.    ED Treatments / Results  DIAGNOSTIC STUDIES: Oxygen Saturation is 100% on RA, normal by my interpretation.   COORDINATION OF CARE: 7:44 PM- Will refill medications. Pt verbalizes understanding and agrees to plan.  Medications - No data to display  Labs (all labs ordered are listed, but only abnormal results are displayed) Labs Reviewed - No data to display  EKG  EKG Interpretation None       Radiology No results found.  Procedures Procedures (including critical care time)  Medications Ordered in ED Medications - No data to  display   Initial Impression / Assessment and Plan / ED Course  I have reviewed the triage vital signs and the nursing notes.  Pertinent labs & imaging results that were available during my care of the patient were reviewed by me and considered in my medical decision making (see chart for details).  Clinical Course   I personally performed the services described in this documentation, which was scribed in my presence. The recorded information has been reviewed and is accurate.   Final Clinical Impressions(s) / ED Diagnoses   Final diagnoses:  Medication refill   Labs:  Imaging:  Consults:  Therapeutics:  Discharge Meds:   Assessment/Plan:   Pt here for medication refill. She has no complaints other than need for medication. She will be given refills with instructions to follow-up with primary doctor for evaluation and further medical management.   New Prescriptions Discharge Medication List as of 08/25/2016  8:09 PM    START taking these medications   Details  hydrOXYzine (ATARAX/VISTARIL) 25 MG tablet Take 1 tablet (25 mg total) by mouth 1 day or 1 dose., Starting Mon 08/25/2016, Until Tue 08/26/2016, Print    Melatonin 5 MG TABS Take 1 tablet (5 mg total) by mouth once., Starting Mon 08/25/2016, Print         Eyvonne MechanicJeffrey Mashell Sieben, PA-C 08/25/16 2036    Courteney Lyn Mackuen, MD 08/26/16 0002

## 2016-08-25 NOTE — ED Triage Notes (Signed)
Pt reports she needs her cogentin and melatonin refilled. Pt states she has been out for "about a month." Denies any complaints.

## 2016-08-28 DIAGNOSIS — F209 Schizophrenia, unspecified: Secondary | ICD-10-CM | POA: Diagnosis not present

## 2016-09-25 ENCOUNTER — Emergency Department (HOSPITAL_COMMUNITY)
Admission: EM | Admit: 2016-09-25 | Discharge: 2016-09-25 | Disposition: A | Discharge status: Home / Self Care | Payer: Medicare Other | Attending: Emergency Medicine | Admitting: Emergency Medicine

## 2016-09-25 ENCOUNTER — Encounter (HOSPITAL_COMMUNITY): Payer: Self-pay | Admitting: Emergency Medicine

## 2016-09-25 ENCOUNTER — Emergency Department (HOSPITAL_COMMUNITY): Payer: Medicare Other

## 2016-09-25 DIAGNOSIS — Z79899 Other long term (current) drug therapy: Secondary | ICD-10-CM | POA: Diagnosis not present

## 2016-09-25 DIAGNOSIS — Z23 Encounter for immunization: Secondary | ICD-10-CM | POA: Diagnosis not present

## 2016-09-25 DIAGNOSIS — J45909 Unspecified asthma, uncomplicated: Secondary | ICD-10-CM | POA: Diagnosis not present

## 2016-09-25 DIAGNOSIS — Y939 Activity, unspecified: Secondary | ICD-10-CM | POA: Insufficient documentation

## 2016-09-25 DIAGNOSIS — Y929 Unspecified place or not applicable: Secondary | ICD-10-CM | POA: Diagnosis not present

## 2016-09-25 DIAGNOSIS — S61411A Laceration without foreign body of right hand, initial encounter: Secondary | ICD-10-CM | POA: Diagnosis not present

## 2016-09-25 DIAGNOSIS — W108XXA Fall (on) (from) other stairs and steps, initial encounter: Secondary | ICD-10-CM | POA: Diagnosis not present

## 2016-09-25 DIAGNOSIS — Y999 Unspecified external cause status: Secondary | ICD-10-CM | POA: Diagnosis not present

## 2016-09-25 MED ORDER — TETANUS-DIPHTH-ACELL PERTUSSIS 5-2.5-18.5 LF-MCG/0.5 IM SUSP
0.5000 mL | Freq: Once | INTRAMUSCULAR | Status: AC
  Administered 2016-09-25: 0.5 mL via INTRAMUSCULAR
  Filled 2016-09-25: qty 0.5

## 2016-09-25 MED ORDER — LIDOCAINE-EPINEPHRINE (PF) 2 %-1:200000 IJ SOLN
20.0000 mL | Freq: Once | INTRAMUSCULAR | Status: AC
  Administered 2016-09-25: 20 mL

## 2016-09-25 NOTE — ED Provider Notes (Signed)
[facility]-EMERGENCY DEPT Provider Note   CSN: [id number] Arrival date & time: 09/25/16  1319  By signing my name below, I, Freida Busmaniana Omoyeni, attest that this documentation has been prepared under the direction and in the presence of Melburn HakeNicole Nadeau, [place]PA-C. Electronically Signed: Freida Busmaniana Omoyeni, Scribe. 09/25/2016. 4:50 PM.  History   Chief Complaint Chief Complaint  Patient presents with  . Extremity Laceration     The history is provided by the patient. No language interpreter was used.    HPI Comments:  Kathy Brown is a 35 y.o. female who presents to the Emergency Department complaining of a deep laceration to the right palm which she sustained s/p fall today. She reports constant pain to the site. Pt states she tripped and fell while walking Up the wooden steps on her porch. She notes she fell with her hands landing on the edge of the wooden step. She denies head injury. No LOC. No alleviating factors noted for pain or bleeding. Denies use of anticoagulants. Tetanus status unknown.   Past Medical History:  Diagnosis Date  . Anxiety   . Asthma   . Chronic back pain   . Schizophrenia [facility](HCC)     Patient Active Problem List   Diagnosis Date Noted  . Auditory hallucinations   . Paranoid schizophrenia (HCC)   . Schizoaffective disorder (HCC) 03/25/2014  . Psychosis 03/25/2014  . Paranoia (HCC) 01/29/2014    History reviewed. No pertinent surgical history.  OB History    No data available       Home Medications    Prior to Admission medications   Medication Sig Start Date End Date Taking? Authorizing Provider  benztropine (COGENTIN) 0.5 MG tablet Take 1 tablet (0.5 mg total) by mouth 2 (two) times daily. 08/25/16   Eyvonne MechanicJeffrey Hedges, PA-C  haloperidol (HALDOL) 2 MG tablet Take 1 tablet (2 mg total) by mouth at bedtime. 09/20/14   Shuvon B Rankin, NP  haloperidol decanoate (HALDOL DECANOATE) 100 MG/ML injection Inject 100 mg into the muscle every 30 (thirty) days. 05/08/14    Historical Provider, MD  traZODone (DESYREL) 50 MG tablet Take 0.5 tablets (25 mg total) by mouth at bedtime as needed for sleep. 04/25/15   Toy CookeyMegan Docherty, MD    Family History History reviewed. No pertinent family history.  Social History Social History  Substance Use Topics  . Smoking status: Never Smoker  . Smokeless tobacco: Never Used  . Alcohol use No     Allergies   Patient has no known allergies.   Review of Systems Review of Systems  Skin: Positive for wound.  Neurological: Negative for tremors, syncope and numbness.     Physical Exam Updated Vital Signs BP 120/67 (BP Location: Left Arm)   Pulse 94   Temp 98.1 F (36.7 C) (Oral)   Resp 20   SpO2 98%   Physical Exam  Constitutional: She is oriented to person, place, and time. She appears well-developed and well-nourished.  HENT:  Head: Normocephalic and atraumatic.  Eyes: Conjunctivae and EOM are normal. Right eye exhibits no discharge. Left eye exhibits no discharge. No scleral icterus.  Cardiovascular: Normal rate and intact distal pulses.   Pulmonary/Chest: Effort normal.  Musculoskeletal:       Right hand: She exhibits tenderness and laceration. She exhibits normal range of motion, no bony tenderness, normal two-point discrimination, normal capillary refill, no deformity and no swelling. Normal sensation noted. Normal strength noted.       Hands: 4cm laceration noted to medial aspect of  palmar surface of right hand. Wound actively bleeding with small superficial arterial bleed present, resolved s/p suture placed and use of hemostat gauze. No visible tendons, muscle, bone. Laceration through subcutaneous fat. FROM of right digits, hand and wrist with 5/5 strength. Sensation grossly intact. 2+ radial pulse. Cap refill <2.   Neurological: She is alert and oriented to person, place, and time.  Skin: Skin is warm and dry. Capillary refill takes less than 2 seconds.  Nursing note and vitals reviewed.    ED  Treatments / Results  DIAGNOSTIC STUDIES:  Oxygen Saturation is 99% on RA, normal by my interpretation.    COORDINATION OF CARE:  2:10 PM Discussed treatment plan with pt at bedside and pt agreed to plan.  Labs (all labs ordered are listed, but only abnormal results are displayed) Labs Reviewed - No data to display  EKG  EKG Interpretation None       Radiology Dg Hand Complete Right  Result Date: 09/25/2016 CLINICAL DATA:  Tripped and fell on wooden steps.  Laceration. EXAM: RIGHT HAND - COMPLETE 3+ VIEW COMPARISON:  None. FINDINGS: Soft tissue injury of the medial hand adjacent to the MCP joint of the small finger. No sign of fracture, radiopaque foreign object or air/ gas in the joint. IMPRESSION: Soft tissue injury. Electronically Signed   By: Paulina Fusi M.D.   On: 09/25/2016 15:15    Procedures .Marland KitchenLaceration Repair Date/Time: 09/25/2016 4:45 PM Performed by: Barrett Henle Authorized by: Barrett Henle   Consent:    Consent obtained:  Verbal   Consent given by:  Patient Anesthesia (see MAR for exact dosages):    Anesthesia method:  Local infiltration   Local anesthetic:  Lidocaine 1% WITH epi Laceration details:    Location:  Hand   Hand location:  R palm   Length (cm):  4 Repair type:    Repair type:  Intermediate Exploration:    Hemostasis achieved with:  Tied off vessels and direct pressure   Wound exploration: wound explored through full range of motion and entire depth of wound probed and visualized     Wound extent: vascular damage     Wound extent: no foreign bodies/material noted, no muscle damage noted, no nerve damage noted, no tendon damage noted and no underlying fracture noted     Contaminated: no   Treatment:    Area cleansed with:  Betadine and saline   Amount of cleaning:  Extensive   Irrigation solution:  Sterile water   Irrigation method:  Syringe   Visualized foreign bodies/material removed: no   Subcutaneous repair:     Suture size:  5-0   Suture material:  Vicryl   Suture technique:  Figure eight   Number of sutures:  3 Skin repair:    Repair method:  Sutures   Suture size:  5-0   Suture technique:  Simple interrupted   Number of sutures:  8 Approximation:    Approximation:  Close   Vermilion border: well-aligned   Post-procedure details:    Dressing:  Antibiotic ointment and non-adherent dressing   Patient tolerance of procedure:  Tolerated well, no immediate complications   (including critical care time)  Medications Ordered in ED Medications  lidocaine-EPINEPHrine (XYLOCAINE W/EPI) 2 %-1:200000 (PF) injection 20 mL (20 mLs Infiltration Given by Other 09/25/16 1444)  Tdap (BOOSTRIX) injection 0.5 mL (0.5 mLs Intramuscular Given 09/25/16 1455)     Initial Impression / Assessment and Plan / ED Course  I have reviewed the  triage vital signs and the nursing notes.  Pertinent labs & imaging results that were available during my care of the patient were reviewed by me and considered in my medical decision making (see chart for details).  Clinical Course     Tetanus updated in ED. Laceration occurred < 12 hours prior to repair. Discussed laceration care with pt and answered questions. Pt to f-u for suture removal in 7 days and wound check sooner should there be signs of dehiscence or infection. Pt is hemodynamically stable with no complaints prior to dc.    Final Clinical Impressions(s) / ED Diagnoses   Final diagnoses:  Laceration of right hand without foreign body, initial encounter    New Prescriptions New Prescriptions   No medications on file   I personally performed the services described in this documentation, which was scribed in my presence. The recorded information has been reviewed and is accurate.     Satira Sarkicole Elizabeth FranklinNadeau, [place] JerseyPA-C 09/25/16 1650    Lorre NickAnthony Allen, MD 10/02/16 [phone]

## 2016-09-25 NOTE — ED Notes (Signed)
Suture cart at bedside 

## 2016-09-25 NOTE — ED Notes (Signed)
Patient transported to X-ray 

## 2016-09-25 NOTE — ED Notes (Signed)
Family arrived and currently sitting with patient

## 2016-09-25 NOTE — ED Notes (Signed)
Called and spoke with grandmother per patients request.  Grandmother states no family member will be coming to see patient at this time.  Grandmother requests we give patient a bus pass when she is discharged.

## 2016-09-25 NOTE — ED Triage Notes (Signed)
Pt from home with c/o approx 1.5 right palm laceration s/p tripping and falling on wooden steps at home.  Pt tearful in triage, wound bandaged.  NAD, A&O.

## 2016-09-25 NOTE — Discharge Instructions (Signed)
I recommend taking Tylenol and/or ibuprofen as prescribed over-the-counter as and for pain relief. He may also rest, elevate and apply ice to her right hand for 15 minutes 3-4 times daily to help with pain and swelling. Keep wound clean using Dial antibacterial soap and water, pat dry. You may apply a small amount of bacitracin ointment to your wound daily. Return to the emergency department in 7 days for suture removal. Please return to the Emergency Department if symptoms worsen or new onset of fever, redness, swelling, warmth, drainage, numbness, tingling, weakness, decreased range of motion.

## 2016-10-02 ENCOUNTER — Emergency Department (HOSPITAL_COMMUNITY)
Admission: EM | Admit: 2016-10-02 | Discharge: 2016-10-02 | Disposition: A | Discharge status: Home / Self Care | Payer: Medicare Other | Attending: Emergency Medicine | Admitting: Emergency Medicine

## 2016-10-02 ENCOUNTER — Encounter (HOSPITAL_COMMUNITY): Payer: Self-pay

## 2016-10-02 DIAGNOSIS — Z79899 Other long term (current) drug therapy: Secondary | ICD-10-CM | POA: Insufficient documentation

## 2016-10-02 DIAGNOSIS — F209 Schizophrenia, unspecified: Secondary | ICD-10-CM | POA: Diagnosis not present

## 2016-10-02 DIAGNOSIS — Z4802 Encounter for removal of sutures: Secondary | ICD-10-CM | POA: Diagnosis not present

## 2016-10-02 DIAGNOSIS — J45909 Unspecified asthma, uncomplicated: Secondary | ICD-10-CM | POA: Diagnosis not present

## 2016-10-02 NOTE — ED Provider Notes (Signed)
[facility]-EMERGENCY DEPT Provider Note   CSN: [id number] Arrival date & time: 10/02/16  1257  By signing my name below, I, Kathy Brown, attest that this documentation has been prepared under the direction and in the presence of Kathy Piliyler Zacari Stiff, PA-C. Electronically Signed: Sonum Brown, Neurosurgeoncribe. 10/02/16. 1:14 PM.  History   Chief Complaint No chief complaint on file.   The history is provided by the patient. No language interpreter was used.    HPI Comments: Kathy Brown is a 35 y.o. female who presents to the Emergency Department today requesting suture removal from the right hand. Patient as seen in the ED 1 week ago when she had sutures placed. She denies associated pain or drainage from the affected area. She has no complaints at this time. No fevers. No numbness/tingling.   Past Medical History:  Diagnosis Date  . Anxiety   . Asthma   . Chronic back pain   . Schizophrenia [facility](HCC)     Patient Active Problem List   Diagnosis Date Noted  . Auditory hallucinations   . Paranoid schizophrenia (HCC)   . Schizoaffective disorder (HCC) 03/25/2014  . Psychosis 03/25/2014  . Paranoia (HCC) 01/29/2014    No past surgical history on file.  OB History    No data available       Home Medications    Prior to Admission medications   Medication Sig Start Date End Date Taking? Authorizing Provider  benztropine (COGENTIN) 0.5 MG tablet Take 1 tablet (0.5 mg total) by mouth 2 (two) times daily. 08/25/16   Kathy MechanicJeffrey Hedges, PA-C  haloperidol (HALDOL) 2 MG tablet Take 1 tablet (2 mg total) by mouth at bedtime. 09/20/14   Kathy B Rankin, NP  haloperidol decanoate (HALDOL DECANOATE) 100 MG/ML injection Inject 100 mg into the muscle every 30 (thirty) days. 05/08/14   Historical Provider, MD  traZODone (DESYREL) 50 MG tablet Take 0.5 tablets (25 mg total) by mouth at bedtime as needed for sleep. 04/25/15   Kathy CookeyMegan Docherty, MD    Family History No family history on file.  Social History Social  History  Substance Use Topics  . Smoking status: Never Smoker  . Smokeless tobacco: Never Used  . Alcohol use No     Allergies   Patient has no known allergies.   Review of Systems Review of Systems  Constitutional: Negative for fever.  Skin: Positive for wound.     Physical Exam Updated Vital Signs BP 146/84 (BP Location: Left Arm)   Pulse 90   Temp 97.4 F (36.3 C) (Oral)   Resp 18   LMP 10/02/2016   SpO2 97%   Physical Exam  Constitutional: She is oriented to person, place, and time. She appears well-developed and well-nourished.  HENT:  Head: Normocephalic and atraumatic.  Cardiovascular: Normal rate.   Pulmonary/Chest: Effort normal.  Neurological: She is alert and oriented to person, place, and time.  Skin: Skin is warm and dry.  Well healing 4 cm laceration to the palmar aspect of the right hand. No signs of infection   Psychiatric: She has a normal mood and affect.  Nursing note and vitals reviewed.  ED Treatments / Results  DIAGNOSTIC STUDIES: Oxygen Saturation is 97% on RA, adequate by my interpretation.    COORDINATION OF CARE: 1:15 PM Discussed treatment plan with pt at bedside and pt agreed to plan.    Labs (all labs ordered are listed, but only abnormal results are displayed) Labs Reviewed - No data to display  EKG  EKG  Interpretation None       Radiology No results found.  Procedures Procedures (including critical care time)  SUTURE REMOVAL Performed by: Kathy Piliyler Joleigh Mineau, PA-C  Authorized by: Kathy Piliyler Bannon Giammarco, PA-C Consent: Verbal consent obtained. Consent given by: patient Required items: required blood products, implants, devices, and special equipment available  Time out: Immediately prior to procedure a "time out" was called to verify the correct patient, procedure, equipment, support staff and site/side marked as required. Location: right hand, palmer aspect Wound Appearance: clean, well healed Sutures Removed: 8 Post-removal: simple  dressing Patient tolerance: Patient tolerated the procedure well with no immediate complications.   Medications Ordered in ED Medications - No data to display   Initial Impression / Assessment and Plan / ED Course  I have reviewed the triage vital signs and the nursing notes.  Pertinent labs & imaging results that were available during my care of the patient were reviewed by me and considered in my medical decision making (see chart for details).  Clinical Course    Final Clinical Impressions(s) / ED Diagnoses     I have reviewed the relevant previous healthcare records.  I obtained HPI from historian.   ED Course:  Assessment: Suture removal   Pt to ER for suture removal and wound check as above. Procedure tolerated well. Vitals normal, no signs of infection. Scar minimization & return precautions given at dc.    Disposition/Plan:  DC Home Additional Verbal discharge instructions given and discussed with patient.  Pt Instructed to f/u with PCP in the next week for evaluation and treatment of symptoms. Return precautions given Pt acknowledges and agrees with plan  Supervising Physician Kathy Munchobert Lockwood, MD  Final diagnoses:  Visit for suture removal    New Prescriptions New Prescriptions   No medications on file    I personally performed the services described in this documentation, which was scribed in my presence. The recorded information has been reviewed and is accurate.    Kathy Piliyler Markee Remlinger, PA-C 10/02/16 1321    Kathy Munchobert Lockwood, MD 10/02/16 [phone]

## 2016-10-02 NOTE — Discharge Planning (Signed)
Pt up for discharge. EDCM reviewed chart for possible CM needs.  No needs identified or communicated.  

## 2016-10-02 NOTE — Discharge Instructions (Signed)
Please read and follow all provided instructions.  Your diagnoses today include:  1. Visit for suture removal     Tests performed today include: Vital signs. See below for your results today.   Medications prescribed:  Take as prescribed   Home care instructions:  Follow any educational materials contained in this packet.  Follow-up instructions: Please follow-up with your primary care provider for further evaluation of symptoms and treatment   Return instructions:  Please return to the Emergency Department if you do not get better, if you get worse, or new symptoms OR  - Fever (temperature greater than 101.73F)  - Bleeding that does not stop with holding pressure to the area    -Severe pain (please note that you may be more sore the day after your accident)  - Chest Pain  - Difficulty breathing  - Severe nausea or vomiting  - Inability to tolerate food and liquids  - Passing out  - Skin becoming red around your wounds  - Change in mental status (confusion or lethargy)  - New numbness or weakness    Please return if you have any other emergent concerns.  Additional Information:  Your vital signs today were: BP 146/84 (BP Location: Left Arm)    Pulse 90    Temp 97.4 F (36.3 C) (Oral)    Resp 18    LMP 10/02/2016    SpO2 97%  If your blood pressure (BP) was elevated above 135/85 this visit, please have this repeated by your doctor within one month. ---------------

## 2016-10-02 NOTE — ED Triage Notes (Signed)
Pt presents for suture removal to R hand, placed here x 1 week ago.

## 2016-10-16 DIAGNOSIS — F209 Schizophrenia, unspecified: Secondary | ICD-10-CM | POA: Diagnosis not present

## 2016-11-04 DIAGNOSIS — F209 Schizophrenia, unspecified: Secondary | ICD-10-CM | POA: Diagnosis not present

## 2016-11-25 ENCOUNTER — Encounter (HOSPITAL_COMMUNITY): Payer: Self-pay

## 2016-11-25 ENCOUNTER — Emergency Department (HOSPITAL_COMMUNITY)
Admission: EM | Admit: 2016-11-25 | Discharge: 2016-11-25 | Disposition: A | Discharge status: Home / Self Care | Payer: Medicare Other | Attending: Emergency Medicine | Admitting: Emergency Medicine

## 2016-11-25 DIAGNOSIS — Z760 Encounter for issue of repeat prescription: Secondary | ICD-10-CM | POA: Diagnosis not present

## 2016-11-25 DIAGNOSIS — J45909 Unspecified asthma, uncomplicated: Secondary | ICD-10-CM | POA: Insufficient documentation

## 2016-11-25 DIAGNOSIS — R51 Headache: Secondary | ICD-10-CM | POA: Diagnosis not present

## 2016-11-25 DIAGNOSIS — K0889 Other specified disorders of teeth and supporting structures: Secondary | ICD-10-CM | POA: Diagnosis not present

## 2016-11-25 MED ORDER — HYDROXYZINE HCL 25 MG PO TABS: 25.0000 mg | ORAL_TABLET | Freq: Two times a day (BID) | ORAL | 0 refills | Status: DC | PRN

## 2016-11-25 MED ORDER — HALOPERIDOL 2 MG PO TABS: 2.0000 mg | ORAL_TABLET | Freq: Every day | ORAL | 0 refills | Status: DC

## 2016-11-25 MED ORDER — FLUOXETINE HCL 20 MG PO TABS: 20.0000 mg | ORAL_TABLET | Freq: Every day | ORAL | 0 refills | Status: DC

## 2016-11-25 NOTE — ED Provider Notes (Signed)
[facility]-EMERGENCY DEPT Provider Note   CSN: [id number] Arrival date & time: 11/25/16  1400   By signing my name below, I, [name], attest that this documentation has been prepared under the direction and in the presence of Sharilyn Sites, PA-C Electronically Signed: [name], ED Scribe. 11/25/16. 3:00 PM.  History   Chief Complaint Chief Complaint  Patient presents with  . Medication Refill    HPI Gladyse Corvin is a 36 y.o. female with a PMHx of paranoid schizophrenia, auditory hallucinations, who presents to the Emergency Department brought in by EMS complaining of medication refill onset yesterday. Pt reports associated symptoms of HA which is common when not taking her medications. She notes that she has ran out of her hydroxyzine and fluxetine x 1 week. Pt reports if she doesn't take the medications she has SI and is emotional. Denies SI/HI/AVH currently.  States she usually sees [facility] for ongoing care but unable to make an appt recently due to finances.  Patient has brought in her medication bottles-- needs refill of her hydroxyzine, prozac, and haldol.   The history is provided by the patient. No language interpreter was used.    Past Medical History:  Diagnosis Date  . Anxiety   . Asthma   . Chronic back pain   . Schizophrenia [facility])     Patient Active Problem List   Diagnosis Date Noted  . Auditory hallucinations   . Paranoid schizophrenia (HCC)   . Schizoaffective disorder (HCC) 03/25/2014  . Psychosis 03/25/2014  . Paranoia (HCC) 01/29/2014    History reviewed. No pertinent surgical history.  OB History    No data available       Home Medications    Prior to Admission medications   Medication Sig Start Date End Date Taking? Authorizing Provider  benztropine (COGENTIN) 0.5 MG tablet Take 1 tablet (0.5 mg total) by mouth 2 (two) times daily. 08/25/16   Eyvonne Mechanic, PA-C  haloperidol (HALDOL) 2 MG tablet Take 1 tablet (2 mg total)  by mouth at bedtime. 09/20/14   Shuvon B Rankin, NP  haloperidol decanoate (HALDOL DECANOATE) 100 MG/ML injection Inject 100 mg into the muscle every 30 (thirty) days. 05/08/14   Historical Provider, MD  traZODone (DESYREL) 50 MG tablet Take 0.5 tablets (25 mg total) by mouth at bedtime as needed for sleep. 04/25/15   Toy Cookey, MD    Family History History reviewed. No pertinent family history.  Social History Social History  Substance Use Topics  . Smoking status: Never Smoker  . Smokeless tobacco: Never Used  . Alcohol use No     Allergies   Patient has no known allergies.   Review of Systems Review of Systems  Constitutional: Negative for chills and fever.  Neurological: Positive for headaches.  Psychiatric/Behavioral: Negative for self-injury and suicidal ideas.     Physical Exam Updated Vital Signs BP 151/95 (BP Location: Right Arm)   Pulse 105   Temp 98.4 F (36.9 C) (Oral)   Resp 15   Ht 5\' 3"  (1.6 m)   Wt 215 lb (97.5 kg)   LMP 09/30/2016   SpO2 95%   BMI 38.09 kg/m   Physical Exam  Constitutional: She is oriented to person, place, and time. She appears well-developed and well-nourished. No distress.  HENT:  Head: Normocephalic and atraumatic.  Right Ear: External ear normal.  Left Ear: External ear normal.  Mouth/Throat: Oropharynx is clear and moist.  Eyes: Conjunctivae and EOM are normal. Pupils are equal,  round, and reactive to light.  Neck: Normal range of motion and full passive range of motion without pain. Neck supple. No neck rigidity.  No rigidity, no meningismus  Cardiovascular: Normal rate, regular rhythm and normal heart sounds.   No murmur heard. Pulmonary/Chest: Effort normal and breath sounds normal. No respiratory distress. She has no wheezes. She has no rhonchi.  Abdominal: Soft. Bowel sounds are normal. There is no tenderness. There is no guarding.  Musculoskeletal: Normal range of motion. She exhibits no edema.  Neurological:  She is alert and oriented to person, place, and time. She has normal strength. She displays no tremor. No cranial nerve deficit or sensory deficit. She displays no seizure activity.  AAOx3, answering questions and following commands appropriately; equal strength UE and LE bilaterally; CN grossly intact; moves all extremities appropriately without ataxia; no focal neuro deficits or facial asymmetry appreciated  Skin: Skin is warm and dry. No rash noted. She is not diaphoretic.  Psychiatric: She has a normal mood and affect. Her behavior is normal. Thought content normal. She is not actively hallucinating. She expresses no homicidal ideation. She expresses no suicidal plans and no homicidal plans.  Odd affect Denies SI/HI/AVH  Nursing note and vitals reviewed.    ED Treatments / Results  DIAGNOSTIC STUDIES: Oxygen Saturation is 95% on RA, adequate by my interpretation.    COORDINATION OF CARE: 2:59 PM Discussed treatment plan with pt at bedside which includes hydroxyzine, fluextine, haldol, and pt agreed to plan.   Procedures Procedures (including critical care time)  Medications Ordered in ED Medications - No data to display   Initial Impression / Assessment and Plan / ED Course  I have reviewed the triage vital signs and the nursing notes.  36 year old female here requesting medication refill. She ran out of her hydroxyzine, fluoxetine, and Haldol about 1 week ago. She denies any suicidal or homicidal ideation. No hallucinations. Patient complains of mild headache-- state it is normal when she doesn't take her medications.  She is neurologically intact, non-toxic in appearance.  No clinical signs or symptoms concerning for meningitis.  I have refilled patient's medications for 2 weeks. She has been given medication refills in the past from the ED as well. I've instructed patient that she must follow-up with her doctor for future refills.  Discussed plan with patient, she acknowledged  understanding and agreed with plan of care.  Return precautions given for new or worsening symptoms.  Final Clinical Impressions(s) / ED Diagnoses   Final diagnoses:  Medication refill    New Prescriptions Discharge Medication List as of 11/25/2016  3:07 PM    START taking these medications   Details  FLUoxetine (PROZAC) 20 MG tablet Take 1 tablet (20 mg total) by mouth daily., Starting Tue 11/25/2016, Print    hydrOXYzine (ATARAX/VISTARIL) 25 MG tablet Take 1 tablet (25 mg total) by mouth 2 (two) times daily as needed for anxiety., Starting Tue 11/25/2016, Print       I personally performed the services described in this documentation, which was scribed in my presence. The recorded information has been reviewed and is accurate.    Garlon HatchetLisa M Juwan Vences, PA-C 11/25/16 1531    Gwyneth SproutWhitney Plunkett, MD 11/26/16 2135

## 2016-11-25 NOTE — Discharge Instructions (Signed)
Take the prescribed medication as directed.  I have refilled these for 2 weeks.  You need to see your doctor, you will not be given further refills from the ED. Return to the ED for new or worsening symptoms.

## 2016-11-25 NOTE — ED Triage Notes (Addendum)
PT RECEIVED FROM HOME VIA EMS C/O A HEADACHE SINCE YESTERDAY. PT STS SHE HAS A HEADACHE BECAUSE SHE WAS UNABLE TO FILL HER MEDICATIONS OF HYDROXYZINE AND FLUXETINE LAST MONTH. PT STS SHE DID NOT HAVE THE MONEY TO GET THEM. DENIES VISUAL CHANGES, SI, OR HI.

## 2016-12-02 DIAGNOSIS — F209 Schizophrenia, unspecified: Secondary | ICD-10-CM | POA: Diagnosis not present

## 2016-12-15 ENCOUNTER — Emergency Department (HOSPITAL_COMMUNITY)
Admission: EM | Admit: 2016-12-15 | Discharge: 2016-12-15 | Disposition: A | Discharge status: Home / Self Care | Payer: Medicare Other | Attending: Emergency Medicine | Admitting: Emergency Medicine

## 2016-12-15 ENCOUNTER — Encounter (HOSPITAL_COMMUNITY): Payer: Self-pay | Admitting: Emergency Medicine

## 2016-12-15 DIAGNOSIS — F419 Anxiety disorder, unspecified: Secondary | ICD-10-CM | POA: Insufficient documentation

## 2016-12-15 DIAGNOSIS — G479 Sleep disorder, unspecified: Secondary | ICD-10-CM | POA: Diagnosis not present

## 2016-12-15 DIAGNOSIS — J45909 Unspecified asthma, uncomplicated: Secondary | ICD-10-CM | POA: Diagnosis not present

## 2016-12-15 DIAGNOSIS — Z760 Encounter for issue of repeat prescription: Secondary | ICD-10-CM | POA: Diagnosis not present

## 2016-12-15 DIAGNOSIS — Z79899 Other long term (current) drug therapy: Secondary | ICD-10-CM | POA: Diagnosis not present

## 2016-12-15 MED ORDER — LORAZEPAM 1 MG PO TABS
1.0000 mg | ORAL_TABLET | Freq: Once | ORAL | Status: AC
  Administered 2016-12-15: 1 mg via ORAL
  Filled 2016-12-15: qty 1

## 2016-12-15 MED ORDER — HALOPERIDOL 2 MG PO TABS: 2.0000 mg | ORAL_TABLET | Freq: Every day | ORAL | 0 refills | Status: DC

## 2016-12-15 MED ORDER — FLUOXETINE HCL 20 MG PO TABS: 20.0000 mg | ORAL_TABLET | Freq: Every day | ORAL | 0 refills | Status: DC

## 2016-12-15 MED ORDER — HYDROXYZINE HCL 25 MG PO TABS: 25.0000 mg | ORAL_TABLET | Freq: Two times a day (BID) | ORAL | 0 refills | Status: DC

## 2016-12-15 MED FILL — FLUoxetine HCL 20 MG TABS: 20 | 30 days supply | Qty: 30 | Fill #0

## 2016-12-15 MED FILL — HALOPERIDOL 2 MG TABLET: 2 | 30 days supply | Qty: 30 | Fill #0

## 2016-12-15 MED FILL — hydrOXYzine HCL 25 MG TABS: 25 | 6 days supply | Qty: 12 | Fill #0

## 2016-12-15 NOTE — Discharge Instructions (Signed)
Please follow up with psychiatrist as scheduled. Take medications as prescribed.

## 2016-12-15 NOTE — ED Triage Notes (Signed)
Pt reports unable to sleep x 1 week. St out of meds x 3 days , family sts pt called police this Am and reported she is SI. Pt denies plan . Reports, "  All I need is my meds"/ pt has an appointment in 2 days with her PCP for meds refill.

## 2016-12-15 NOTE — Care Management (Signed)
ED CM consulted by T. Kirichenko PA-C concerning medications refill. [facility]. As per patient's mom patient missed appointment and ran out of meds and was not able to get a rescheduled appoint ment until weeks later. Patient was brought to the ED by mom for medication refills, she has been off meds for 2 weeks as per mom.  [facility]. Mpm has made CM aware that she does not drive, and will be needing a bus pass to get home. CM offered [facility] where she can pick them upon discharge, mom is agreeable and appreciative of the assistance.  CM spoke with EDP regarding refilling patient's prescriptions and [facility] EDP is agreeable. Prescriptions  Faxed to [phone] called to confirmed. Mom verbalized understanding of transitional plan teach back done.  Updated EDP of transitional care plan, she is agreeable. No further ED CM needs identified.

## 2016-12-15 NOTE — ED Provider Notes (Signed)
[facility]-EMERGENCY DEPT Provider Note   CSN: [id number] Arrival date & time: 12/15/16  0910     History   Chief Complaint Chief Complaint  Patient presents with  . Medical Clearance    HPI Kathy Brown is a 36 y.o. female.  HPI Kathy Brown is a 36 y.o. female with history of schizophrenia, presents to emergency department complaining of inability to sleep. Patient is accompanied by her mother. Patient states she ran out of her Haldol that she takes at nighttime. She is unable to tell me when she ran out of this medication. She states she does not have an appointment with psychiatrist until March, however her mother states that she found out about patient is running out of her medicine and made her appointment in 2 days from today. Patient states that she feels so bad since not being on her medicine, and states she is thinking about hurting herself. Her mother states "I think she is just seeking attention." Patient denies homicidal ideations. Patient states that if she just gets a prescription for her medicine, she does not think she would be suicidal any longer.  Past Medical History:  Diagnosis Date  . Anxiety   . Asthma   . Chronic back pain   . Schizophrenia [facility])     Patient Active Problem List   Diagnosis Date Noted  . Auditory hallucinations   . Paranoid schizophrenia (HCC)   . Schizoaffective disorder (HCC) 03/25/2014  . Psychosis 03/25/2014  . Paranoia (HCC) 01/29/2014    History reviewed. No pertinent surgical history.  OB History    No data available       Home Medications    Prior to Admission medications   Medication Sig Start Date End Date Taking? Authorizing Provider  benztropine (COGENTIN) 0.5 MG tablet Take 1 tablet (0.5 mg total) by mouth 2 (two) times daily. 08/25/16  Yes Jeffrey Hedges, PA-C  FLUoxetine (PROZAC) 20 MG tablet Take 1 tablet (20 mg total) by mouth daily. 11/25/16  Yes Garlon Hatchet, PA-C  haloperidol (HALDOL) 2 MG tablet Take 1  tablet (2 mg total) by mouth at bedtime. 11/25/16  Yes Garlon Hatchet, PA-C  haloperidol decanoate (HALDOL DECANOATE) 100 MG/ML injection Inject 100 mg into the muscle every 30 (thirty) days. 05/08/14  Yes Historical Provider, MD  hydrOXYzine (ATARAX/VISTARIL) 25 MG tablet Take 1 tablet (25 mg total) by mouth 2 (two) times daily as needed for anxiety. 11/25/16  Yes Garlon Hatchet, PA-C  traZODone (DESYREL) 50 MG tablet Take 0.5 tablets (25 mg total) by mouth at bedtime as needed for sleep. 04/25/15  Yes Toy Cookey, MD    Family History No family history on file.  Social History Social History  Substance Use Topics  . Smoking status: Never Smoker  . Smokeless tobacco: Never Used  . Alcohol use No     Allergies   Patient has no known allergies.   Review of Systems Review of Systems  Constitutional: Negative for chills and fever.  Respiratory: Negative for cough, chest tightness and shortness of breath.   Cardiovascular: Negative for chest pain, palpitations and leg swelling.  Gastrointestinal: Negative for abdominal pain, diarrhea, nausea and vomiting.  Genitourinary: Negative for dysuria, flank pain and pelvic pain.  Musculoskeletal: Negative for arthralgias, myalgias, neck pain and neck stiffness.  Skin: Negative for rash.  Neurological: Negative for dizziness, weakness and headaches.  Psychiatric/Behavioral: Positive for agitation, behavioral problems, dysphoric mood, sleep disturbance and suicidal ideas. The patient is nervous/anxious.   All  other systems reviewed and are negative.    Physical Exam Updated Vital Signs BP 140/89 (BP Location: Left Arm)   Pulse 89   Temp 98.3 F (36.8 C) (Oral)   Resp 18   SpO2 95%   Physical Exam  Constitutional: She appears well-developed and well-nourished. No distress.  HENT:  Head: Normocephalic.  Eyes: Conjunctivae are normal.  Neck: Neck supple.  Cardiovascular: Normal rate, regular rhythm and normal heart sounds.     Pulmonary/Chest: Effort normal and breath sounds normal. No respiratory distress. She has no wheezes. She has no rales.  Abdominal: Soft. Bowel sounds are normal. She exhibits no distension. There is no tenderness. There is no rebound.  Musculoskeletal: She exhibits no edema.  Neurological: She is alert.  Skin: Skin is warm and dry.  Psychiatric: Her mood appears anxious. Her speech is rapid and/or pressured. She is aggressive. Cognition and memory are impaired.  Nursing note and vitals reviewed.    ED Treatments / Results  Labs (all labs ordered are listed, but only abnormal results are displayed) Labs Reviewed - No data to display  EKG  EKG Interpretation None       Radiology No results found.  Procedures Procedures (including critical care time)  Medications Ordered in ED Medications  LORazepam (ATIVAN) tablet 1 mg (1 mg Oral Given 12/15/16 1018)     Initial Impression / Assessment and Plan / ED Course  I have reviewed the triage vital signs and the nursing notes.  Pertinent labs & imaging results that were available during my care of the patient were reviewed by me and considered in my medical decision making (see chart for details).     Pt in ED with early psychoses, has ran out of her medications. Mother comfortable taking her home if we can refill her medications. Pt states if she can get her medications, she will no longer be suicidal. Given ativan in ED with which she is much calmer on re evaluation. Mother states that even if we give her the medications, she will not be able to go to the pharmacy to get them filles. Spoke with Burna MortimerWanda, with case management, will see if local pharmacy can fill these for them.   Burna [facility]Wanda [facility] Will [facility] scrips [facility]. [facility].   Vitals:   12/15/16 0917  BP: 140/89  Pulse: 89  Resp: 18  Temp: 98.3 F (36.8 C)  TempSrc: Oral  SpO2: 95%       Final Clinical  Impressions(s) / ED Diagnoses   Final diagnoses:  Medication refill    New Prescriptions Discharge Medication List as of 12/15/2016 11:09 AM    START taking these medications   Details  !! FLUoxetine (PROZAC) 20 MG tablet Take 1 tablet (20 mg total) by mouth daily., Starting Mon 12/15/2016, Print    !! haloperidol (HALDOL) 2 MG tablet Take 1 tablet (2 mg total) by mouth at bedtime., Starting Mon 12/15/2016, Print    !! hydrOXYzine (ATARAX/VISTARIL) 25 MG tablet Take 1 tablet (25 mg total) by mouth 2 (two) times daily., Starting Mon 12/15/2016, Print     !! - Potential duplicate medications found. Please discuss with provider.       Jaynie Crumbleatyana Tersea Aulds, PA-C 12/15/16 1629    Alvira MondayErin Schlossman, MD 12/16/16 [phone]

## 2016-12-30 DIAGNOSIS — F209 Schizophrenia, unspecified: Secondary | ICD-10-CM | POA: Diagnosis not present

## 2017-01-13 DIAGNOSIS — F209 Schizophrenia, unspecified: Secondary | ICD-10-CM | POA: Diagnosis not present

## 2017-01-14 DIAGNOSIS — R300 Dysuria: Secondary | ICD-10-CM | POA: Diagnosis not present

## 2017-01-27 DIAGNOSIS — F209 Schizophrenia, unspecified: Secondary | ICD-10-CM | POA: Diagnosis not present

## 2017-02-07 ENCOUNTER — Encounter (HOSPITAL_COMMUNITY): Payer: Self-pay | Admitting: Emergency Medicine

## 2017-02-07 ENCOUNTER — Emergency Department (HOSPITAL_COMMUNITY)
Admission: EM | Admit: 2017-02-07 | Discharge: 2017-02-08 | Disposition: A | Discharge status: Home / Self Care | Payer: Medicare Other | Attending: Emergency Medicine | Admitting: Emergency Medicine

## 2017-02-07 DIAGNOSIS — K029 Dental caries, unspecified: Secondary | ICD-10-CM

## 2017-02-07 DIAGNOSIS — J45909 Unspecified asthma, uncomplicated: Secondary | ICD-10-CM | POA: Diagnosis not present

## 2017-02-07 DIAGNOSIS — F419 Anxiety disorder, unspecified: Secondary | ICD-10-CM | POA: Diagnosis not present

## 2017-02-07 DIAGNOSIS — K0889 Other specified disorders of teeth and supporting structures: Secondary | ICD-10-CM | POA: Diagnosis not present

## 2017-02-07 NOTE — ED Triage Notes (Signed)
Pt presents to ED for assessment of lower dental pain x 2 weeks.  Pt called EMS for pickup for "tooth ache and hot flashes".  Pt acting odd en route, and admitted to EMS that she called because "I don't feel safe at home at night alone".  Patient has a hx of schizophrenia, but denies A/V hallucinations or SI.  Patient states she normally lives with her mother, but her mother "went to the hospital today".

## 2017-02-08 DIAGNOSIS — K029 Dental caries, unspecified: Secondary | ICD-10-CM | POA: Diagnosis not present

## 2017-02-08 LAB — CBC WITH DIFFERENTIAL/PLATELET
Basophils Absolute: 0 10*3/uL (ref 0.0–0.1)
Basophils Relative: 0 %
Eosinophils Absolute: 0.1 10*3/uL (ref 0.0–0.7)
Eosinophils Relative: 2 %
HEMATOCRIT: 34.5 % — AB (ref 36.0–46.0)
HEMOGLOBIN: 11.1 g/dL — AB (ref 12.0–15.0)
LYMPHS ABS: 1.9 10*3/uL (ref 0.7–4.0)
Lymphocytes Relative: 31 %
MCH: 28 pg (ref 26.0–34.0)
MCHC: 32.2 g/dL (ref 30.0–36.0)
MCV: 87.1 fL (ref 78.0–100.0)
MONO ABS: 0.5 10*3/uL (ref 0.1–1.0)
Monocytes Relative: 8 %
Neutro Abs: 3.5 10*3/uL (ref 1.7–7.7)
Neutrophils Relative %: 59 %
Platelets: 245 10*3/uL (ref 150–400)
RBC: 3.96 MIL/uL (ref 3.87–5.11)
RDW: 15 % (ref 11.5–15.5)
WBC: 6 10*3/uL (ref 4.0–10.5)

## 2017-02-08 LAB — COMPREHENSIVE METABOLIC PANEL
ALK PHOS: 53 U/L (ref 38–126)
ALT: 24 U/L (ref 14–54)
AST: 32 U/L (ref 15–41)
Albumin: 3.5 g/dL (ref 3.5–5.0)
Anion gap: 6 (ref 5–15)
BUN: 6 mg/dL (ref 6–20)
CALCIUM: 8.9 mg/dL (ref 8.9–10.3)
CO2: 27 mmol/L (ref 22–32)
CREATININE: 0.69 mg/dL (ref 0.44–1.00)
Chloride: 104 mmol/L (ref 101–111)
GFR calc Af Amer: >60 mL/min (ref >60)
Glucose, Bld: 122 mg/dL — ABNORMAL HIGH (ref 65–99)
POTASSIUM: 3.8 mmol/L (ref 3.5–5.1)
Sodium: 137 mmol/L (ref 135–145)
TOTAL PROTEIN: 7.4 g/dL (ref 6.5–8.1)
Total Bilirubin: 0.4 mg/dL (ref 0.3–1.2)

## 2017-02-08 LAB — I-STAT BETA HCG BLOOD, ED (MC, WL, AP ONLY): I-stat hCG, quantitative: <5 m[IU]/mL (ref <5)

## 2017-02-08 LAB — ETHANOL: Alcohol, Ethyl (B): <5 mg/dL (ref <5)

## 2017-02-08 MED ORDER — LORAZEPAM 1 MG PO TABS: 1.0000 mg | ORAL_TABLET | Freq: Three times a day (TID) | ORAL | Status: DC | PRN

## 2017-02-08 MED ORDER — HALOPERIDOL 2 MG PO TABS: 2.0000 mg | ORAL_TABLET | Freq: Every day | ORAL | Status: DC

## 2017-02-08 MED ORDER — PENICILLIN V POTASSIUM 500 MG PO TABS: 500.0000 mg | ORAL_TABLET | Freq: Four times a day (QID) | ORAL | 0 refills | Status: AC

## 2017-02-08 MED ORDER — TRAZODONE HCL 50 MG PO TABS
25.0000 mg | ORAL_TABLET | Freq: Every evening | ORAL | Status: DC | PRN
Start: 2017-02-08 — End: 2017-02-08

## 2017-02-08 MED ORDER — BENZTROPINE MESYLATE 1 MG PO TABS: 0.5000 mg | ORAL_TABLET | Freq: Two times a day (BID) | ORAL | Status: DC

## 2017-02-08 MED ORDER — PENICILLIN V POTASSIUM 250 MG PO TABS
500.0000 mg | ORAL_TABLET | Freq: Once | ORAL | Status: AC
  Administered 2017-02-08: 500 mg via ORAL
  Filled 2017-02-08: qty 2

## 2017-02-08 MED ORDER — HYDROXYZINE HCL 25 MG PO TABS: 25.0000 mg | ORAL_TABLET | Freq: Two times a day (BID) | ORAL | Status: DC | PRN

## 2017-02-08 NOTE — Progress Notes (Signed)
Attempted to contact the pt's legal guardian Nadara Mustard (pt's mother) but did not receive answer. No voicemail set up.  Princess Bruins, MSW, LCSWA TTS Specialist [phone]

## 2017-02-08 NOTE — BH Assessment (Signed)
Spoke with Crystal, RN who requested TTS call back in 10 minutes in order to complete the telepsych assessment.  Princess Bruins, MSW, LCSWA TTS Specialist [phone]

## 2017-02-08 NOTE — ED Provider Notes (Signed)
[facility] DEPT Provider Note   CSN: [id number] Arrival date & time: 02/07/17  2317   By signing my name below, I, Kathy Brown, attest that this documentation has been prepared under the direction and in the presence of [name], MD. Electronically Signed: Soijett Brown, ED Scribe. 02/08/17. 1:13 AM.  History   Chief Complaint Chief Complaint  Patient presents with  . Dental Pain  . Anxiety    HPI Kathy Brown is a 36 y.o. female with a PMHx of anxiety, schizophrenia, who presents to the Emergency Department brought in by EMS complaining of left lower dental pain onset 1.5 weeks. Pt has not tried any medications for the relief of her symptoms. Denies having a dentist at this time. She denies fever, nausea, vomiting, and any other symptoms.   She secondarily states that she informed EMS that she didn't feel safe at home, due to thinking that she may harm herself. Pt reports associated SI. Pt has not tried any medications for the relief of her symptoms. Pt denies taking her psych medications at this time and is unsure of the last time that she took them. Denies taking illegal drugs. Denies anxiety, auditory/visual hallucinations, and any other symptoms.    The history is provided by the patient. No language interpreter was used.  Dental Pain   This is a new problem. The current episode started more than 1 week ago (1.5 weeks ago). The problem has not changed since onset.The pain is mild. She has tried nothing for the symptoms. The treatment provided no relief.    Past Medical History:  Diagnosis Date  . Anxiety   . Asthma   . Chronic back pain   . Schizophrenia [facility])     Patient Active Problem List   Diagnosis Date Noted  . Auditory hallucinations   . Paranoid schizophrenia (HCC)   . Schizoaffective disorder (HCC) 03/25/2014  . Psychosis 03/25/2014  . Paranoia (HCC) 01/29/2014    History reviewed. No pertinent surgical history.  OB History    No data  available       Home Medications    Prior to Admission medications   Medication Sig Start Date End Date Taking? Authorizing Provider  benztropine (COGENTIN) 0.5 MG tablet Take 1 tablet (0.5 mg total) by mouth 2 (two) times daily. 08/25/16   Eyvonne Mechanic, PA-C  FLUoxetine (PROZAC) 20 MG tablet Take 1 tablet (20 mg total) by mouth daily. 11/25/16   Garlon Hatchet, PA-C  FLUoxetine (PROZAC) 20 MG tablet Take 1 tablet (20 mg total) by mouth daily. 12/15/16   Tatyana Kirichenko, PA-C  haloperidol (HALDOL) 2 MG tablet Take 1 tablet (2 mg total) by mouth at bedtime. 11/25/16   Garlon Hatchet, PA-C  haloperidol (HALDOL) 2 MG tablet Take 1 tablet (2 mg total) by mouth at bedtime. 12/15/16   Tatyana Kirichenko, PA-C  haloperidol decanoate (HALDOL DECANOATE) 100 MG/ML injection Inject 100 mg into the muscle every 30 (thirty) days. 05/08/14   Historical Provider, MD  hydrOXYzine (ATARAX/VISTARIL) 25 MG tablet Take 1 tablet (25 mg total) by mouth 2 (two) times daily as needed for anxiety. 11/25/16   Garlon Hatchet, PA-C  hydrOXYzine (ATARAX/VISTARIL) 25 MG tablet Take 1 tablet (25 mg total) by mouth 2 (two) times daily. 12/15/16   Tatyana Kirichenko, PA-C  traZODone (DESYREL) 50 MG tablet Take 0.5 tablets (25 mg total) by mouth at bedtime as needed for sleep. 04/25/15   Toy Cookey, MD    Family History History reviewed. No  pertinent family history.  Social History Social History  Substance Use Topics  . Smoking status: Never Smoker  . Smokeless tobacco: Never Used  . Alcohol use No     Allergies   Patient has no known allergies.   Review of Systems Review of Systems  Constitutional: Negative for fever.  HENT: Positive for dental problem (left lower).   Gastrointestinal: Negative for nausea and vomiting.  Psychiatric/Behavioral: Positive for suicidal ideas. Negative for hallucinations. The patient is not nervous/anxious.   All other systems reviewed and are negative.    Physical  Exam Updated Vital Signs BP (!) 136/93 (BP Location: Left Wrist)   Pulse (!) 108   Temp 97.4 F (36.3 C) (Oral)   Resp 16   SpO2 98%   Physical Exam CONSTITUTIONAL: Well developed/well nourished HEAD: Normocephalic/atraumatic EYES: EOMI/PERRL ENMT: poor dentition noted. Multiple caries noted. No focal abscess. No trismus. Mucous membranes moist NECK: supple no meningeal signs CV: S1/S2 noted, no murmurs/rubs/gallops noted LUNGS: Lungs are clear to auscultation bilaterally, no apparent distress ABDOMEN: soft, NEURO: Pt is awake/alert/appropriate, moves all extremitiesx4.  No facial droop.   EXTREMITIES: pulses normal/equal, full ROM SKIN: warm, color normal PSYCH: flat affect.   ED Treatments / Results  DIAGNOSTIC STUDIES: Oxygen Saturation is 98% on RA, nl by my interpretation.    COORDINATION OF CARE: 1:10 AM Discussed treatment plan with pt at bedside and pt agreed to plan.   Labs (all labs ordered are listed, but only abnormal results are displayed) Labs Reviewed  COMPREHENSIVE METABOLIC PANEL - Abnormal; Notable for the following:       Result Value   Glucose, Bld 122 (*)    All other components within normal limits  CBC WITH DIFFERENTIAL/PLATELET - Abnormal; Notable for the following:    Hemoglobin 11.1 (*)    HCT 34.5 (*)    All other components within normal limits  ETHANOL  I-STAT BETA HCG BLOOD, ED ([facility], [facility], [facility] ONLY)    EKG  EKG Interpretation None       Radiology No results found.  Procedures Procedures (including critical care time)  Medications Ordered in ED Medications  penicillin v potassium (VEETID) tablet 500 mg (500 mg Oral Given 02/08/17 0254)     Initial Impression / Assessment and Plan / ED Course  I have reviewed the triage vital signs and the nursing notes.  Pertinent labs  results that were available during my care of the patient were reviewed by me and considered in my medical decision making (see chart for details).     Pt  in the ED for dental pain, then reports SI to me on initial assessment She has long h/o psychiatric disorder She was seen by psych - pt is now denying SI and is cleared for discharge She denies any hallucinations Pt awake/alert, ambulatory She does not appear psychotic Labs reassuring Will d/c home followup with dentistry   Final Clinical Impressions(s) / ED Diagnoses   Final diagnoses:  Pain due to dental caries    New Prescriptions New Prescriptions   No medications on file   I personally performed the services described in this documentation, which was scribed in my presence. The recorded information has been reviewed and is accurate.        [name], MD 02/08/17 [id number]

## 2017-02-08 NOTE — ED Notes (Signed)
Pt refusing vital signs at this time.    

## 2017-02-08 NOTE — BH Assessment (Addendum)
Tele Assessment Note   Kathy Brown is an 36 y.o. female who presents to the ED voluntarily BIB EMS. Pt was pleasant and cooperative throughout the assessment. Pt was smiling and stated she came to the ED because "they left me." Pt reports her brother, mother, and her 2 daughters left the home to "go to the doctor." Pt stated "I'm ready to go home now." Pt presents to have a possible DD and states her mother is her legal guardian. Pt also reports she has a son who is at his best friends house.   Pt was asked if she experiences AVH and she stated "not for a while. I take medication for that." Pt reports she is followed by [facility] and sees a psychiatrist and a therapist regularly. Pt continued to state throughout the assessment that she was ready to get back in her bed. Pt stated she usually calls a neighbor whenever she feels nervous about being home alone but did not call today. Pt was asked if she has a safe way to get home and she stated "ya'll could give me a bus pass or a cab voucher." Pt denies SI or HI and states she is able to contract for safety.   Pt does not meet inpt criteria per Nira Conn, NP. Case discussed with [name], MD who is in agreement with the pt being d/c. Report given to [employer], Charity fundraiser.  Diagnosis: hx of Schizophrenia per chart  Past Medical History:  Past Medical History:  Diagnosis Date  . Anxiety   . Asthma   . Chronic back pain   . Schizophrenia (HCC)     History reviewed. No pertinent surgical history.  Family History: History reviewed. No pertinent family history.  Social History:  reports that she has never smoked. She has never used smokeless tobacco. She reports that she does not drink alcohol or use drugs.  Additional Social History:  Alcohol / Drug Use Pain Medications: See PTA meds Prescriptions: See PTA meds Over the Counter: See PTA meds History of alcohol / drug use?: No history of alcohol / drug abuse  CIWA: CIWA-Ar BP: (!)  136/93 Pulse Rate: (!) 108 COWS:    PATIENT STRENGTHS: (choose at least two) Psychologist, counselling means  Allergies: No Known Allergies  Home Medications:  (Not in a hospital admission)  OB/GYN Status:  No LMP recorded. Patient is not currently having periods (Reason: Irregular Periods).  General Assessment Data Location of Assessment: [facility] ED TTS Assessment: In system Is this a Tele or Face-to-Face Assessment?: Tele Assessment Is this an Initial Assessment or a Re-assessment for this encounter?: Initial Assessment Marital status: Single Is patient pregnant?: No Pregnancy Status: No Living Arrangements: Parent, Other relatives, Children Can pt return to current living arrangement?: Yes Admission Status: Voluntary Is patient capable of signing voluntary admission?: Yes Referral Source: Self/Family/Friend Insurance type: Medicare     Crisis Care Plan Living Arrangements: Parent, Other relatives, Children Legal Guardian: Mother Name of Psychiatrist: [facility] Name of Therapist: [facility]  Education Status Is patient currently in school?: No Highest grade of school patient has completed: 12th  Risk to self with the past 6 months Suicidal Ideation: No Has patient been a risk to self within the past 6 months prior to admission? : No Suicidal Intent: No Has patient had any suicidal intent within the past 6 months prior to admission? : No Is patient at risk for suicide?: No Suicidal Plan?: No Has patient had any suicidal plan within the past 6  months prior to admission? : No Access to Means: No What has been your use of drugs/alcohol within the last 12 months?: denies Previous Attempts/Gestures: No Triggers for Past Attempts: None known Intentional Self Injurious Behavior: None Family Suicide History: No Recent stressful life event(s): Other (Comment) (none reported) Persecutory voices/beliefs?: No Depression: No Substance abuse history and/or treatment for  substance abuse?: No Suicide prevention information given to non-admitted patients: Not applicable  Risk to Others within the past 6 months Homicidal Ideation: No Does patient have any lifetime risk of violence toward others beyond the six months prior to admission? : No Thoughts of Harm to Others: No Current Homicidal Intent: No Current Homicidal Plan: No Access to Homicidal Means: No History of harm to others?: No Assessment of Violence: None Noted Does patient have access to weapons?: No Criminal Charges Pending?: No Does patient have a court date: No Is patient on probation?: No  Psychosis Hallucinations: Auditory (pt reports she takes medication to control the voices) Delusions: None noted  Mental Status Report Appearance/Hygiene: Unremarkable Eye Contact: Good Motor Activity: Freedom of movement Speech: Logical/coherent Level of Consciousness: Alert Mood: Pleasant, Euthymic Affect: Apathetic Anxiety Level: None Thought Processes: Flight of Ideas Judgement: Impaired Orientation: Person, Place, Time Obsessive Compulsive Thoughts/Behaviors: None  Cognitive Functioning Concentration: Normal Memory: Remote Intact, Recent Intact IQ: Average Insight: Poor Impulse Control: Fair Appetite: Good Sleep: No Change Total Hours of Sleep: 8 Vegetative Symptoms: None  ADLScreening [facility] Assessment Services) Patient's cognitive ability adequate to safely complete daily activities?: No Patient able to express need for assistance with ADLs?: Yes Independently performs ADLs?: Yes (appropriate for developmental age)  Prior Inpatient Therapy Prior Inpatient Therapy: Yes Prior Therapy Dates: 2015, 2012 Prior Therapy Facilty/Provider(s): [facility] Reason for Treatment: Schizophrenia  Prior Outpatient Therapy Prior Outpatient Therapy: Yes Prior Therapy Dates: current  Prior Therapy Facilty/Provider(s): [facility] Reason for Treatment: Med Management Does patient have an ACCT team?:  No Does patient have Intensive In-House Services?  : No Does patient have [facility] services? : Yes Does patient have P4CC services?: No  ADL Screening (condition at time of admission) Patient's cognitive ability adequate to safely complete daily activities?: No Is the patient deaf or have difficulty hearing?: No Does the patient have difficulty seeing, even when wearing glasses/contacts?: No Does the patient have difficulty concentrating, remembering, or making decisions?: Yes Patient able to express need for assistance with ADLs?: Yes Does the patient have difficulty dressing or bathing?: No Independently performs ADLs?: Yes (appropriate for developmental age) Does the patient have difficulty walking or climbing stairs?: No Weakness of Legs: None Weakness of Arms/Hands: None  Home Assistive Devices/Equipment Home Assistive Devices/Equipment: None    Abuse/Neglect Assessment (Assessment to be complete while patient is alone) Physical Abuse: Denies Verbal Abuse: Denies Sexual Abuse: Denies Exploitation of patient/patient's resources: Denies Self-Neglect: Denies     Merchant navy officer (For Healthcare) Does Patient Have a Medical Advance Directive?: No Would patient like information on creating a medical advance directive?: No - Patient declined    Additional Information 1:1 In Past 12 Months?: No CIRT Risk: No Elopement Risk: No Does patient have medical clearance?: Yes     Disposition:  Disposition Initial Assessment Completed for this Encounter: Yes Disposition of Patient: Other dispositions Other disposition(s): Other (Comment) (D/C to current provider per Nira Conn, NP)  [name] 02/08/2017 2:22 AM

## 2017-02-24 DIAGNOSIS — F209 Schizophrenia, unspecified: Secondary | ICD-10-CM | POA: Diagnosis not present

## 2017-04-03 ENCOUNTER — Emergency Department (HOSPITAL_COMMUNITY)
Admission: EM | Admit: 2017-04-03 | Discharge: 2017-04-03 | Disposition: A | Discharge status: Home / Self Care | Payer: Medicare Other | Attending: Emergency Medicine | Admitting: Emergency Medicine

## 2017-04-03 ENCOUNTER — Encounter (HOSPITAL_COMMUNITY): Payer: Self-pay | Admitting: *Deleted

## 2017-04-03 DIAGNOSIS — H578 Other specified disorders of eye and adnexa: Secondary | ICD-10-CM | POA: Diagnosis not present

## 2017-04-03 DIAGNOSIS — Z79899 Other long term (current) drug therapy: Secondary | ICD-10-CM | POA: Insufficient documentation

## 2017-04-03 DIAGNOSIS — J45909 Unspecified asthma, uncomplicated: Secondary | ICD-10-CM | POA: Diagnosis not present

## 2017-04-03 DIAGNOSIS — H5713 Ocular pain, bilateral: Secondary | ICD-10-CM | POA: Diagnosis present

## 2017-04-03 DIAGNOSIS — H5789 Other specified disorders of eye and adnexa: Secondary | ICD-10-CM

## 2017-04-03 MED ORDER — TETRACAINE HCL 0.5 % OP SOLN
2.0000 [drp] | Freq: Once | OPHTHALMIC | Status: DC
  Filled 2017-04-03 (×2): qty 2

## 2017-04-03 MED ORDER — TETRACAINE HCL 0.5 % OP SOLN
2.0000 [drp] | Freq: Once | OPHTHALMIC | Status: AC
  Administered 2017-04-03: 2 [drp] via OPHTHALMIC

## 2017-04-03 MED ORDER — TETRACAINE HCL 0.5 % OP SOLN: 1.0000 [drp] | Freq: Once | OPHTHALMIC | Status: DC

## 2017-04-03 MED ORDER — POLYMYXIN B-TRIMETHOPRIM 10000-0.1 UNIT/ML-% OP SOLN
1.0000 [drp] | Freq: Four times a day (QID) | OPHTHALMIC | Status: DC
  Administered 2017-04-03: 1 [drp] via OPHTHALMIC
  Filled 2017-04-03: qty 10

## 2017-04-03 MED ORDER — FLUORESCEIN SODIUM 0.6 MG OP STRP
1.0000 | ORAL_STRIP | Freq: Once | OPHTHALMIC | Status: AC
  Administered 2017-04-03: 1 via OPHTHALMIC
  Filled 2017-04-03: qty 1

## 2017-04-03 NOTE — Discharge Instructions (Signed)
Please apply 1 drop of eye drop to each eye every 6 hours while awake for 1 week as treatment for suspect eye infection.  Follow up with eye specialist if you notice no improvement.

## 2017-04-03 NOTE — ED Triage Notes (Signed)
Pt in c/o bil eye itching, burning & blurred vision onset x 1 mth, pt takes Fluoxetine 20mg  and has been out of this  Medicine for an unknown amt of time, pt denies auditory  & visual hallucinations at this time, pt calm & cooperative, A&O x4

## 2017-04-03 NOTE — ED Notes (Signed)
Waiting for tetracaine from pharmacy.

## 2017-04-03 NOTE — ED Notes (Signed)
Visual acuity screen... Right eye 10/25.Marland Kitchen.Marland Kitchen.Marland Kitchen.left eye (eye pain) 10/32

## 2017-04-03 NOTE — ED Provider Notes (Signed)
[facility] DEPT Provider Note   CSN: [id number] Arrival date & time: 04/03/17  1907  By signing my name below, I, Orpah Cobb, attest that this documentation has been prepared under the direction and in the presence of Fayrene Helper, PA-C. Electronically Signed: Orpah Cobb , ED Scribe. 04/03/17. 8:15 PM.   History   Chief Complaint Chief Complaint  Patient presents with  . Eye Pain    HPI Kathy Brown is a 36 y.o. female with no significant medical hx who presents to the Emergency Department complaining of constant, worsening OU eye pain with onset x1 month. Pt states that for the past x1 month her eyes have been itchy, painful and puffy and producing discharge bilaterally. She denies wearing contact lenses or seeing anyone else for this complaint. Pt reports OU eye discharge, eye pain, eye itchiness. She has tried eye drops with no relief. Pt denies rhinorrhea, sneezing, cough, rash, visual changes. Pt denies hx of allergies.   The history is provided by the patient. No language interpreter was used.    Past Medical History:  Diagnosis Date  . Anxiety   . Asthma   . Chronic back pain   . Schizophrenia [facility])     Patient Active Problem List   Diagnosis Date Noted  . Auditory hallucinations   . Paranoid schizophrenia (HCC)   . Schizoaffective disorder (HCC) 03/25/2014  . Psychosis 03/25/2014  . Paranoia (HCC) 01/29/2014    History reviewed. No pertinent surgical history.  OB History    No data available       Home Medications    Prior to Admission medications   Medication Sig Start Date End Date Taking? Authorizing Provider  benztropine (COGENTIN) 0.5 MG tablet Take 1 tablet (0.5 mg total) by mouth 2 (two) times daily. 08/25/16   Hedges, Tinnie Gens, PA-C  FLUoxetine (PROZAC) 20 MG tablet Take 1 tablet (20 mg total) by mouth daily. 11/25/16   Garlon Hatchet, PA-C  FLUoxetine (PROZAC) 20 MG tablet Take 1 tablet (20 mg total) by mouth daily. 12/15/16    Kirichenko, Tatyana, PA-C  haloperidol (HALDOL) 2 MG tablet Take 1 tablet (2 mg total) by mouth at bedtime. 11/25/16   Garlon Hatchet, PA-C  haloperidol (HALDOL) 2 MG tablet Take 1 tablet (2 mg total) by mouth at bedtime. 12/15/16   Kirichenko, Tatyana, PA-C  haloperidol decanoate (HALDOL DECANOATE) 100 MG/ML injection Inject 100 mg into the muscle every 30 (thirty) days. 05/08/14   [provider]  hydrOXYzine (ATARAX/VISTARIL) 25 MG tablet Take 1 tablet (25 mg total) by mouth 2 (two) times daily as needed for anxiety. 11/25/16   Garlon Hatchet, PA-C  hydrOXYzine (ATARAX/VISTARIL) 25 MG tablet Take 1 tablet (25 mg total) by mouth 2 (two) times daily. 12/15/16   Kirichenko, Lemont Fillers, PA-C  traZODone (DESYREL) 50 MG tablet Take 0.5 tablets (25 mg total) by mouth at bedtime as needed for sleep. 04/25/15   Toy Cookey, MD    Family History No family history on file.  Social History Social History  Substance Use Topics  . Smoking status: Never Smoker  . Smokeless tobacco: Never Used  . Alcohol use No     Allergies   Patient has no known allergies.   Review of Systems Review of Systems  HENT: Negative for rhinorrhea and sneezing.   Eyes: Positive for pain, discharge and itching. Negative for visual disturbance.  Respiratory: Negative for cough.   Skin: Negative for rash.     Physical Exam Updated Vital Signs  BP (!) 149/97 (BP Location: Right Arm)   Pulse 100   Temp 99.2 F (37.3 C) (Oral)   Resp 18   Ht 5\' 3"  (1.6 m)   Wt 234 lb (106.1 kg)   SpO2 95%   BMI 41.45 kg/m   Physical Exam  Constitutional: She appears well-developed and well-nourished. No distress.  HENT:  Head: Normocephalic and atraumatic.  Eyes: Conjunctivae, EOM and lids are normal. Pupils are equal, round, and reactive to light. Lids are everted and swept, no foreign bodies found. Right eye exhibits discharge. Right eye exhibits no chemosis, no exudate and no hordeolum. No foreign body present in  the right eye. Left eye exhibits discharge. Left eye exhibits no chemosis, no exudate and no hordeolum. No foreign body present in the left eye. Right conjunctiva is not injected. Right conjunctiva has no hemorrhage. Left conjunctiva is not injected. Left conjunctiva has no hemorrhage. No scleral icterus. Right eye exhibits normal extraocular motion and no nystagmus. Left eye exhibits normal extraocular motion and no nystagmus.  Slit lamp exam:      The right eye shows no corneal abrasion, no corneal flare, no corneal ulcer, no foreign body, no hyphema, no hypopyon, no fluorescein uptake and no anterior chamber bulge.       The left eye shows no corneal abrasion, no corneal flare, no corneal ulcer, no foreign body, no hyphema, no hypopyon, no fluorescein uptake and no anterior chamber bulge.  Both upper and lower eyelids are mildly adenomatous with discharge noted. Pupils are equal, round and reactive to light. EOM intact. Eyelids were everted with no foreign bodies noted. Normal conjunctiva, normal sclera.    Neck: Neck supple.  Cardiovascular: Normal rate and regular rhythm.   No murmur heard. Pulmonary/Chest: Effort normal and breath sounds normal. No respiratory distress.  Abdominal: Soft. There is no tenderness.  Musculoskeletal: She exhibits no edema.  Neurological: She is alert.  Skin: Skin is warm and dry.  Psychiatric: She has a normal mood and affect.  Nursing note and vitals reviewed.    ED Treatments / Results   DIAGNOSTIC STUDIES: Oxygen Saturation is 95% on RA, inadequate by my interpretation.   COORDINATION OF CARE: 8:16 PM-Discussed next steps with pt. Pt verbalized understanding and is agreeable with the plan.    Labs (all labs ordered are listed, but only abnormal results are displayed) Labs Reviewed - No data to display  EKG  EKG Interpretation None       Radiology No results found.  Procedures Procedures (including critical care time)  Medications  Ordered in ED Medications - No data to display   Initial Impression / Assessment and Plan / ED Course  I have reviewed the triage vital signs and the nursing notes.  Pertinent labs & imaging results that were available during my care of the patient were reviewed by me and considered in my medical decision making (see chart for details).     No foreign bodies noted. No surrounding erythema, swelling, vision changes/loss suspicious for orbital or periorbital cellulitis. No signs of iritis. No signs of glaucoma, intraocular pressures normal. No symptoms of retinal detachment. No ophthalmologic emergency suspected. Outpatient referral given in case of no improvement. Pt d/c with polytrim eye drops x 1 week.     Final Clinical Impressions(s) / ED Diagnoses   Final diagnoses:  Eye irritation    New Prescriptions New Prescriptions   No medications on file   I personally performed the services described in this documentation, which  was scribed in my presence. The recorded information has been reviewed and is accurate.       Fayrene Helper, PA-C 04/03/17 2117    Maia Plan, MD 04/04/17 [phone]

## 2017-04-09 DIAGNOSIS — F209 Schizophrenia, unspecified: Secondary | ICD-10-CM | POA: Diagnosis not present

## 2017-09-10 DIAGNOSIS — F250 Schizoaffective disorder, bipolar type: Secondary | ICD-10-CM | POA: Diagnosis not present

## 2017-09-10 DIAGNOSIS — F849 Pervasive developmental disorder, unspecified: Secondary | ICD-10-CM | POA: Diagnosis not present

## 2017-09-10 DIAGNOSIS — F331 Major depressive disorder, recurrent, moderate: Secondary | ICD-10-CM | POA: Diagnosis not present

## 2017-09-15 DIAGNOSIS — F331 Major depressive disorder, recurrent, moderate: Secondary | ICD-10-CM | POA: Diagnosis not present

## 2017-09-15 DIAGNOSIS — F250 Schizoaffective disorder, bipolar type: Secondary | ICD-10-CM | POA: Diagnosis not present

## 2017-09-15 DIAGNOSIS — F849 Pervasive developmental disorder, unspecified: Secondary | ICD-10-CM | POA: Diagnosis not present

## 2017-10-13 DIAGNOSIS — F250 Schizoaffective disorder, bipolar type: Secondary | ICD-10-CM | POA: Diagnosis not present

## 2017-10-13 DIAGNOSIS — F849 Pervasive developmental disorder, unspecified: Secondary | ICD-10-CM | POA: Diagnosis not present

## 2017-10-13 DIAGNOSIS — F331 Major depressive disorder, recurrent, moderate: Secondary | ICD-10-CM | POA: Diagnosis not present

## 2017-10-14 ENCOUNTER — Encounter (HOSPITAL_COMMUNITY): Payer: Self-pay

## 2017-10-14 ENCOUNTER — Inpatient Hospital Stay (HOSPITAL_COMMUNITY)
Admission: EM | Admit: 2017-10-14 | Discharge: 2017-10-18 | DRG: 885 | Disposition: A | Discharge status: Home / Self Care | Payer: Medicare Other | Attending: Nephrology | Admitting: Nephrology

## 2017-10-14 DIAGNOSIS — F2081 Schizophreniform disorder: Secondary | ICD-10-CM | POA: Diagnosis not present

## 2017-10-14 DIAGNOSIS — F419 Anxiety disorder, unspecified: Secondary | ICD-10-CM | POA: Diagnosis present

## 2017-10-14 DIAGNOSIS — G8929 Other chronic pain: Secondary | ICD-10-CM | POA: Diagnosis not present

## 2017-10-14 DIAGNOSIS — F259 Schizoaffective disorder, unspecified: Secondary | ICD-10-CM | POA: Diagnosis not present

## 2017-10-14 DIAGNOSIS — I480 Paroxysmal atrial fibrillation: Secondary | ICD-10-CM | POA: Diagnosis present

## 2017-10-14 DIAGNOSIS — Z79899 Other long term (current) drug therapy: Secondary | ICD-10-CM

## 2017-10-14 DIAGNOSIS — I4891 Unspecified atrial fibrillation: Secondary | ICD-10-CM

## 2017-10-14 DIAGNOSIS — J45909 Unspecified asthma, uncomplicated: Secondary | ICD-10-CM | POA: Diagnosis present

## 2017-10-14 DIAGNOSIS — M549 Dorsalgia, unspecified: Secondary | ICD-10-CM | POA: Diagnosis present

## 2017-10-14 DIAGNOSIS — F250 Schizoaffective disorder, bipolar type: Secondary | ICD-10-CM

## 2017-10-14 DIAGNOSIS — F200 Paranoid schizophrenia: Secondary | ICD-10-CM | POA: Diagnosis present

## 2017-10-14 DIAGNOSIS — R000 Tachycardia, unspecified: Secondary | ICD-10-CM

## 2017-10-14 DIAGNOSIS — R030 Elevated blood-pressure reading, without diagnosis of hypertension: Secondary | ICD-10-CM | POA: Diagnosis not present

## 2017-10-14 LAB — CBC WITH DIFFERENTIAL/PLATELET
Basophils Absolute: 0 10*3/uL (ref 0.0–0.1)
Basophils Relative: 1 %
Eosinophils Absolute: 0.1 10*3/uL (ref 0.0–0.7)
Eosinophils Relative: 2 %
HCT: 34.1 % — ABNORMAL LOW (ref 36.0–46.0)
Hemoglobin: 10.5 g/dL — ABNORMAL LOW (ref 12.0–15.0)
Lymphocytes Relative: 28 %
Lymphs Abs: 2.2 10*3/uL (ref 0.7–4.0)
MCH: 25.4 pg — ABNORMAL LOW (ref 26.0–34.0)
MCHC: 30.8 g/dL (ref 30.0–36.0)
MCV: 82.4 fL (ref 78.0–100.0)
Monocytes Absolute: 0.6 10*3/uL (ref 0.1–1.0)
Monocytes Relative: 7 %
Neutro Abs: 4.9 10*3/uL (ref 1.7–7.7)
Neutrophils Relative %: 62 %
Platelets: 297 10*3/uL (ref 150–400)
RBC: 4.14 MIL/uL (ref 3.87–5.11)
RDW: 15.6 % — ABNORMAL HIGH (ref 11.5–15.5)
WBC: 7.8 10*3/uL (ref 4.0–10.5)

## 2017-10-14 LAB — COMPREHENSIVE METABOLIC PANEL
ALT: 10 U/L — ABNORMAL LOW (ref 14–54)
AST: 15 U/L (ref 15–41)
Albumin: 3.5 g/dL (ref 3.5–5.0)
Alkaline Phosphatase: 57 U/L (ref 38–126)
Anion gap: 8 (ref 5–15)
BUN: 11 mg/dL (ref 6–20)
CO2: 24 mmol/L (ref 22–32)
Calcium: 9.2 mg/dL (ref 8.9–10.3)
Chloride: 103 mmol/L (ref 101–111)
Creatinine, Ser: 0.63 mg/dL (ref 0.44–1.00)
GFR calc Af Amer: >60 mL/min (ref >60)
GFR calc non Af Amer: >60 mL/min (ref >60)
Glucose, Bld: 88 mg/dL (ref 65–99)
Potassium: 4 mmol/L (ref 3.5–5.1)
Sodium: 135 mmol/L (ref 135–145)
Total Bilirubin: 0.7 mg/dL (ref 0.3–1.2)
Total Protein: 7.2 g/dL (ref 6.5–8.1)

## 2017-10-14 LAB — TROPONIN I: Troponin I: <0.03 ng/mL (ref <0.03)

## 2017-10-14 LAB — ETHANOL: Alcohol, Ethyl (B): <10 mg/dL (ref <10)

## 2017-10-14 LAB — RAPID URINE DRUG SCREEN, HOSP PERFORMED
Amphetamines: NOT DETECTED
BENZODIAZEPINES: NOT DETECTED
Barbiturates: NOT DETECTED
Cocaine: NOT DETECTED
OPIATES: NOT DETECTED
Tetrahydrocannabinol: NOT DETECTED

## 2017-10-14 LAB — TSH: TSH: 4.197 u[IU]/mL (ref 0.350–4.500)

## 2017-10-14 LAB — MAGNESIUM: Magnesium: 1.7 mg/dL (ref 1.7–2.4)

## 2017-10-14 MED ORDER — METOPROLOL TARTRATE 5 MG/5ML IV SOLN
5.0000 mg | Freq: Once | INTRAVENOUS | Status: AC
  Administered 2017-10-14: 5 mg via INTRAVENOUS
  Filled 2017-10-14 (×2): qty 5

## 2017-10-14 MED ORDER — METOPROLOL TARTRATE 5 MG/5ML IV SOLN
5.0000 mg | Freq: Once | INTRAVENOUS | Status: AC
  Administered 2017-10-14: 5 mg via INTRAVENOUS

## 2017-10-14 MED ORDER — METOPROLOL TARTRATE 5 MG/5ML IV SOLN
5.0000 mg | Freq: Once | INTRAVENOUS | Status: AC
  Administered 2017-10-14: 5 mg via INTRAVENOUS
  Filled 2017-10-14: qty 5

## 2017-10-14 MED ORDER — ASPIRIN 81 MG PO CHEW
81.0000 mg | CHEWABLE_TABLET | Freq: Once | ORAL | Status: AC
  Administered 2017-10-15: 81 mg via ORAL
  Filled 2017-10-14: qty 1

## 2017-10-14 MED ORDER — LORAZEPAM 2 MG/ML IJ SOLN
1.0000 mg | Freq: Once | INTRAMUSCULAR | Status: AC
  Administered 2017-10-14: 1 mg via INTRAVENOUS
  Filled 2017-10-14: qty 1

## 2017-10-14 NOTE — ED Notes (Signed)
Patients belongings placed in the cabinet marked "patient belongings 9-12/ Margo AyeHall B".

## 2017-10-14 NOTE — BH Assessment (Addendum)
Assessment Note  Kathy Brown is an 36 y.o. female who came in via EMS for apparent mania and bizarre behavior. Pt presented with extremely labile mood. She was loud and crying hysterically one minute and calm the next. Pt's speech was nonsensical and tangential. Pt was able to verify that she goes to [facility], that her mother Kathy Brown(Kathy Brown) is her legal guardian, and that she lives with her mother and 3 kids. Pt denied SI and HI. Pt endorsed AH, indicating that she has been hearing voices "for a long time, every since I was a baby". Pt was unable to say why she was in the ED, but felt that she needed to stay to get her medication. Pt lacked insight into her situation and it was impossible to ascertain the actual reason she was bought to the ED.   Clinician attempted to call pt's guardian and mother, Kathy Coombesammy Brown, on the number listed in the chart, but the line never rang. Pt could not give a number for her mother.    Diagnosis: Schizophrenia, by hx  Past Medical History:  Past Medical History:  Diagnosis Date  . Anxiety   . Asthma   . Chronic back pain   . Schizophrenia (HCC)     History reviewed. No pertinent surgical history.  Family History: No family history on file.  Social History:  reports that  has never smoked. she has never used smokeless tobacco. She reports that she does not drink alcohol or use drugs.  Additional Social History:  Alcohol / Drug Use Pain Medications: See PTA meds Prescriptions: See PTA meds Over the Counter: See PTA meds History of alcohol / drug use?: No history of alcohol / drug abuse  CIWA: CIWA-Ar BP: (!) 146/98 Pulse Rate: 60 COWS:    Allergies: No Known Allergies  Home Medications:  (Not in a hospital admission)  OB/GYN Status:  No LMP recorded. Patient is not currently having periods (Reason: Irregular Periods).  General Assessment Data Location of Assessment: [facility] TTS Assessment: In system Is this a Tele or Face-to-Face  Assessment?: Face-to-Face Is this an Initial Assessment or a Re-assessment for this encounter?: Initial Assessment Marital status: Single Is patient pregnant?: Unknown Pregnancy Status: Unknown Living Arrangements: Parent, Children Can pt return to current living arrangement?: Yes Admission Status: Voluntary Is patient capable of signing voluntary admission?: No Referral Source: Self/Family/Friend Insurance type: Medicare     Crisis Care Plan Living Arrangements: Parent, Children Legal Guardian: Mother(Kathy Bascom LevelsFrazier) [facility] of Psychiatrist: [facility] Name of Therapist: none  Education Status Is patient currently in school?: No  Risk to self with the past 6 months Suicidal Ideation: No Has patient been a risk to self within the past 6 months prior to admission? : No Suicidal Intent: No Has patient had any suicidal intent within the past 6 months prior to admission? : No Is patient at risk for suicide?: No Suicidal Plan?: No Has patient had any suicidal plan within the past 6 months prior to admission? : No Access to Means: No Previous Attempts/Gestures: No Intentional Self Injurious Behavior: None Family Suicide History: Unknown Recent stressful life event(s): Other (Comment) Persecutory voices/beliefs?: No Depression: No Substance abuse history and/or treatment for substance abuse?: No Suicide prevention information given to non-admitted patients: Not applicable  Risk to Others within the past 6 months Homicidal Ideation: No Does patient have any lifetime risk of violence toward others beyond the six months prior to admission? : Unknown Thoughts of Harm to Others: No Current Homicidal Intent:  No Current Homicidal Plan: No Access to Homicidal Means: No History of harm to others?: No Assessment of Violence: None Noted Does patient have access to weapons?: No Criminal Charges Pending?: No Does patient have a court date: No Is patient on probation?:  No  Psychosis Hallucinations: Auditory Delusions: Unspecified  Mental Status Report Appearance/Hygiene: Bizarre, Disheveled, In scrubs Eye Contact: Good Motor Activity: Freedom of movement, Hyperactivity, Agitation Speech: Loud, Pressured, Incoherent, Tangential Level of Consciousness: Alert Mood: Anxious, Labile Affect: Anxious, Appropriate to circumstance, Labile Anxiety Level: Severe Thought Processes: Irrelevant, Tangential Judgement: Impaired Orientation: Person, Place Obsessive Compulsive Thoughts/Behaviors: Unable to Assess  Cognitive Functioning Concentration: Decreased Memory: Unable to Assess IQ: Average Insight: Poor Impulse Control: Unable to Assess Appetite: Fair Sleep: Unable to Assess Vegetative Symptoms: Unable to Assess  ADLScreening [facility] Assessment Services) Patient's cognitive ability adequate to safely complete daily activities?: Yes Patient able to express need for assistance with ADLs?: Yes Independently performs ADLs?: Yes (appropriate for developmental age)  Prior Inpatient Therapy Prior Inpatient Therapy: Yes Prior Therapy Dates: 2012; 2015 [facility] Reason for Treatment: schizophrenia; SI  Prior Outpatient Therapy Prior Outpatient Therapy: No Does patient have an ACCT team?: Unknown Does patient have Intensive In-House Services?  : No Does patient have [facility] services? : Yes Does patient have P4CC services?: No  ADL Screening (condition at time of admission) Patient's cognitive ability adequate to safely complete daily activities?: Yes Is the patient deaf or have difficulty hearing?: No Does the patient have difficulty seeing, even when wearing glasses/contacts?: No Does the patient have difficulty concentrating, remembering, or making decisions?: Yes Patient able to express need for assistance with ADLs?: Yes Does the patient have difficulty dressing or bathing?: No Independently performs ADLs?: Yes  (appropriate for developmental age) Does the patient have difficulty walking or climbing stairs?: No Weakness of Legs: None Weakness of Arms/Hands: None  Home Assistive Devices/Equipment Home Assistive Devices/Equipment: None    Abuse/Neglect Assessment (Assessment to be complete while patient is alone) Abuse/Neglect Assessment Can Be Completed: Unable to assess, patient is non-responsive or altered mental status Values / Beliefs Cultural Requests During Hospitalization: None Spiritual Requests During Hospitalization: None   Advance Directives (For Healthcare) Does Patient Have a Medical Advance Directive?: No Would patient like information on creating a medical advance directive?: No - Patient declined    Additional Information 1:1 In Past 12 Months?: No CIRT Risk: No Elopement Risk: No Does patient have medical clearance?: Yes     Disposition:  Disposition Initial Assessment Completed for this Encounter: Yes(consulted with Hillery Jacksanika Lewis, NP) Disposition of Patient: Re-evaluation by Psychiatry recommended(Overnight observation for safety &/or collateral recommended) Dr. Shaune Pollackameron Isaacs, EDP, notified of disposition.  On Site Evaluation by:   Reviewed with Physician:    Laddie AquasSamantha M Jillianne Gamino 10/14/2017 5:25 PM

## 2017-10-14 NOTE — ED Notes (Signed)
Bed: [id number] Expected date:  Expected time:  Means of arrival:  Comments: Hold for room 42

## 2017-10-14 NOTE — ED Notes (Signed)
Affect flat, mood depressed, behavior appropriate. Pt states that she feels drowsy and wanted to sleep. Denies SI/HI, but states that she hears voices and sees things that others do not. She would not elaborate.

## 2017-10-14 NOTE — ED Triage Notes (Signed)
Pt present via EMS from home. EMS was  called by [name] on a wellness check. Pt  fnd in a manic state  with intermittent periods of mania and sobbing uncontrollable crying with incomprehensible  words and paranioa. EMS reports that pt has AH/VH. Pt denies SI/HI. Pt reported to be aggressive at times with family members. Per EMS pt showed no physical aggression.     EMS  V/s 190/100 HR 120, RR 18, .

## 2017-10-14 NOTE — ED Notes (Signed)
Patient denies SI/HI/VH but endorses AH. Plan of care discussed. Encouragement and support provided and safety maintain. Q 15 min safety checks in place and video monitoring.

## 2017-10-14 NOTE — ED Notes (Signed)
Pt is talking to herself

## 2017-10-14 NOTE — ED Notes (Signed)
Misty StanleyLisa, CN informed that Dr. Erma HeritageIsaacs wanted patient transferred to acute side for A-FIB. Misty StanleyLisa, CN reported no beds available and will call back when a bed is available on acute side.

## 2017-10-14 NOTE — ED Provider Notes (Signed)
[facility]-EMERGENCY DEPT Provider Note   CSN: [id number] Arrival date & time: 10/14/17  1528     History   Chief Complaint Chief Complaint  Patient presents with  . Paranoid  . Manic Behavior   Level 5 caveat due to psychiatric illness HPI Kathy Brown is a 36 y.o. female with history of schizophrenia presents today brought in by EMS for psych evaluation.  [name] was called in for wellness check.  Per triage note, they found patient to be in a manic state with   Intermittent periods of mania and uncontrollable sobbing.  They also note that she was speaking incomprehensibly.  Per EMS report, patient is having auditory and visual hallucinations.  Patient denies suicidal or homicidal ideation.  She also has a history of physical aggression with family members.  When asked about suicidal ideation by myself, patient did.  When asked about homicidal ideation, patient states "my daughter today" followed by a stream of income principal speech.  She then begins to cry and yells out repeatedly "I did not mean to get pregnant I am tired of being pregnant I do not want any more babies ".  She then closes her eyes and stops crying.  The history is provided by the patient and the EMS personnel. The history is limited by the condition of the patient.    Past Medical History:  Diagnosis Date  . Anxiety   . Asthma   . Chronic back pain   . Schizophrenia [facility])     Patient Active Problem List   Diagnosis Date Noted  . Atrial fibrillation with RVR (HCC) 10/15/2017  . Auditory hallucinations   . Paranoid schizophrenia (HCC)   . Schizoaffective disorder (HCC) 03/25/2014  . Psychosis (HCC) 03/25/2014  . Paranoia (HCC) 01/29/2014    History reviewed. No pertinent surgical history.  OB History    No data available       Home Medications    Prior to Admission medications   Medication Sig Start Date End Date Taking? Authorizing Provider  FLUoxetine (PROZAC) 20 MG  tablet Take 1 tablet (20 mg total) by mouth daily. 12/15/16  Yes Kirichenko, Tatyana, PA-C  hydrOXYzine (ATARAX/VISTARIL) 25 MG tablet Take 1 tablet (25 mg total) by mouth 2 (two) times daily as needed for anxiety. 11/25/16  Yes Garlon Hatchet, PA-C  benztropine (COGENTIN) 0.5 MG tablet Take 1 tablet (0.5 mg total) by mouth 2 (two) times daily. 08/25/16   Hedges, Tinnie Gens, PA-C  FLUoxetine (PROZAC) 20 MG tablet Take 1 tablet (20 mg total) by mouth daily. Patient not taking: Reported on 10/14/2017 11/25/16   Garlon Hatchet, PA-C  haloperidol (HALDOL) 2 MG tablet Take 1 tablet (2 mg total) by mouth at bedtime. 11/25/16   Garlon Hatchet, PA-C  haloperidol (HALDOL) 2 MG tablet Take 1 tablet (2 mg total) by mouth at bedtime. 12/15/16   Kirichenko, Tatyana, PA-C  haloperidol decanoate (HALDOL DECANOATE) 100 MG/ML injection Inject 100 mg into the muscle every 30 (thirty) days. 05/08/14   [provider]  hydrOXYzine (ATARAX/VISTARIL) 25 MG tablet Take 1 tablet (25 mg total) by mouth 2 (two) times daily. 12/15/16   Kirichenko, Lemont Fillers, PA-C  traZODone (DESYREL) 50 MG tablet Take 0.5 tablets (25 mg total) by mouth at bedtime as needed for sleep. 04/25/15   Toy Cookey, MD    Family History Family History  Family history unknown: Yes    Social History Social History   Tobacco Use  . Smoking status: Never  Smoker  . Smokeless tobacco: Never Used  Substance Use Topics  . Alcohol use: No  . Drug use: No     Allergies   Patient has no known allergies.   Review of Systems Review of Systems  Unable to perform ROS: Psychiatric disorder     Physical Exam Updated Vital Signs BP (!) 144/99   Pulse (!) 105   Temp 97.6 F (36.4 C) (Oral)   Resp (!) 22   SpO2 95%   Physical Exam  Constitutional: She appears well-developed and well-nourished. No distress.  HENT:  Head: Normocephalic and atraumatic.  Eyes: Conjunctivae and EOM are normal. Pupils are equal, round, and reactive to  light. Right eye exhibits no discharge. Left eye exhibits no discharge.  Neck: Normal range of motion. Neck supple. No JVD present. No tracheal deviation present.  Cardiovascular: Normal heart sounds and intact distal pulses.  Tachycardic, irregularly irregular rhythm  Pulmonary/Chest: Effort normal and breath sounds normal.  Equal rise and fall of chest, no increased work of breathing  Abdominal: Soft. Bowel sounds are normal. She exhibits no distension. There is no tenderness.  Musculoskeletal: She exhibits no edema.  Neurological: She is alert.  Skin: Skin is warm and dry. No erythema.  Psychiatric: Her mood appears anxious. Her affect is labile. Her speech is rapid and/or pressured. Thought content is paranoid.  Does not answer questions appropriately. Occasionally bursts into tears then promptly stops. Laying in bed with arms splayed out to her sides. Examination limited due to patient condition.   Nursing note and vitals reviewed.    ED Treatments / Results  Labs (all labs ordered are listed, but only abnormal results are displayed) Labs Reviewed  COMPREHENSIVE METABOLIC PANEL - Abnormal; Notable for the following components:      Result Value   ALT 10 (*)    All other components within normal limits  CBC WITH DIFFERENTIAL/PLATELET - Abnormal; Notable for the following components:   Hemoglobin 10.5 (*)    HCT 34.1 (*)    MCH 25.4 (*)    RDW 15.6 (*)    All other components within normal limits  RAPID URINE DRUG SCREEN, HOSP PERFORMED  ETHANOL  TROPONIN I  TSH  MAGNESIUM  TROPONIN I  APTT  PROTIME-INR  HIV ANTIBODY (ROUTINE TESTING)  BASIC METABOLIC PANEL  CBC  TROPONIN I  TROPONIN I  TROPONIN I  I-STAT BETA HCG BLOOD, ED ([facility], [facility], [facility] ONLY)    EKG  EKG Interpretation  Date/Time:  Wednesday October 14 2017 19:27:18 EST Ventricular Rate:  123 PR Interval:    QRS Duration: 76 QT Interval:  286 QTC Calculation: 409 R Axis:   46 Text Interpretation:  Atrial  fibrillation with rapid ventricular response Abnormal ECG Since last EKG, rate has increased Confirmed by Shaune PollackIsaacs, Cameron [phone]([id number]) on 10/14/2017 7:45:02 PM       Radiology No results found.  Procedures Procedures (including critical care time)  Medications Ordered in ED Medications  aspirin chewable tablet 81 mg (not administered)  benztropine (COGENTIN) tablet 0.5 mg (not administered)  FLUoxetine (PROZAC) capsule 20 mg (not administered)  haloperidol (HALDOL) tablet 2 mg (not administered)  hydrOXYzine (ATARAX/VISTARIL) tablet 25 mg (not administered)  traZODone (DESYREL) tablet 25 mg (not administered)  acetaminophen (TYLENOL) tablet 650 mg (not administered)    Or  acetaminophen (TYLENOL) suppository 650 mg (not administered)  ondansetron (ZOFRAN) tablet 4 mg (not administered)    Or  ondansetron (ZOFRAN) injection 4 mg (not administered)  metoprolol tartrate (LOPRESSOR)  injection 5 mg (5 mg Intravenous Given 10/14/17 2205)  metoprolol tartrate (LOPRESSOR) injection 5 mg (5 mg Intravenous Given 10/14/17 2250)  LORazepam (ATIVAN) injection 1 mg (1 mg Intravenous Given 10/14/17 2333)  metoprolol tartrate (LOPRESSOR) injection 5 mg (5 mg Intravenous Given 10/14/17 2333)     Initial Impression / Assessment and Plan / ED Course  I have reviewed the triage vital signs and the nursing notes.  Pertinent labs & imaging results that were available during my care of the patient were reviewed by me and considered in my medical decision making (see chart for details).     Patient brought in by [facility] for psychiatric evaluation.  Her affect is labile, she endorses auditory and visual hallucinations.  She mentions abdominal pain, but when questioned as to the duration of the symptoms she states "this is been going on for a long time, ever since I got pregnant ".  She then begins to cry and states "I never meant to get pregnant I never meant to get pregnant ".  Abdominal lab work is  reassuring, I doubt acute intra-abdominal pathology and I suspect her complaint of pain is chronic and may be related to her shizophrenia.   Patient was found to be in A. fib with RVR with a heart rate of 123.  This appears to be new onset.  She responded well to 3 doses of metoprolol, and Ativan was helpful for agitation.  On reevaluation, she is resting comfortably and heart rate is within normal limits.Serial troponins are negative and I doubt ACS or MI.  TSH and magnesium are within normal limits.  Chads vas score is 1 due to female gender.  She was given a 1 mg aspirin.  Spoke with Dr. Toniann Fail with hospitalist service who agrees to assume care of patient and bring her into the hospital for further evaluation.  She will require psych evaluation while in the hospital.  Patient seen and evaluated by Dr. Erma Heritage who agrees with assessment and plan at this time.  CHA2DS2/VAS Stroke Risk Points      1 >= 2 Points: High Risk  1 - 1.99 Points: Medium Risk  0 Points: Low Risk    The patient's score has not changed in the past year.:  No Change     Details    This score determines the patient's risk of having a stroke if the  patient has atrial fibrillation.       Points Metrics  0 Has Congestive Heart Failure:  No   0 Has Vascular Disease:  No   0 Has Hypertension:  No   0 Age:  69   0 Has Diabetes:  No   0 Had Stroke:  No  Had TIA:  No  Had thromboembolism:  No   1 Female:  Yes    CRITICAL CARE Performed by: Jeanie Sewer   Total critical care time: 35 minutes  Critical care time was exclusive of separately billable procedures and treating other patients.  Critical care was necessary to treat or prevent imminent or life-threatening deterioration.  Critical care was time spent personally by me on the following activities: development of treatment plan with patient and/or surrogate as well as nursing, discussions with consultants, evaluation of patient's response to treatment,  examination of patient, obtaining history from patient or surrogate, ordering and performing treatments and interventions, ordering and review of laboratory studies, ordering and review of radiographic studies, pulse oximetry and re-evaluation of patient's condition.  Final Clinical Impressions(s) /  ED Diagnoses   Final diagnoses:  Paroxysmal atrial fibrillation [facility](HCC)    ED Discharge Orders    None       Jeanie SewerFawze, Abria Vannostrand A, PA-C 10/15/17 0130    Shaune PollackIsaacs, Cameron, MD 10/15/17 1615

## 2017-10-14 NOTE — ED Notes (Signed)
Bed: [id number] Expected date: 10/14/17 Expected time: 6:32 PM Means of arrival:  Comments: For pt in room 30

## 2017-10-15 ENCOUNTER — Other Ambulatory Visit: Payer: Self-pay

## 2017-10-15 ENCOUNTER — Other Ambulatory Visit (HOSPITAL_COMMUNITY): Payer: Self-pay

## 2017-10-15 ENCOUNTER — Observation Stay (HOSPITAL_COMMUNITY): Payer: Medicare Other

## 2017-10-15 ENCOUNTER — Encounter (HOSPITAL_COMMUNITY): Payer: Self-pay | Admitting: Internal Medicine

## 2017-10-15 DIAGNOSIS — I480 Paroxysmal atrial fibrillation: Secondary | ICD-10-CM

## 2017-10-15 DIAGNOSIS — J45909 Unspecified asthma, uncomplicated: Secondary | ICD-10-CM | POA: Diagnosis present

## 2017-10-15 DIAGNOSIS — F250 Schizoaffective disorder, bipolar type: Secondary | ICD-10-CM | POA: Diagnosis not present

## 2017-10-15 DIAGNOSIS — M549 Dorsalgia, unspecified: Secondary | ICD-10-CM | POA: Diagnosis present

## 2017-10-15 DIAGNOSIS — I4891 Unspecified atrial fibrillation: Secondary | ICD-10-CM | POA: Diagnosis not present

## 2017-10-15 DIAGNOSIS — G8929 Other chronic pain: Secondary | ICD-10-CM | POA: Diagnosis present

## 2017-10-15 DIAGNOSIS — F259 Schizoaffective disorder, unspecified: Secondary | ICD-10-CM | POA: Diagnosis not present

## 2017-10-15 DIAGNOSIS — F200 Paranoid schizophrenia: Secondary | ICD-10-CM | POA: Diagnosis not present

## 2017-10-15 DIAGNOSIS — F419 Anxiety disorder, unspecified: Secondary | ICD-10-CM | POA: Diagnosis present

## 2017-10-15 DIAGNOSIS — Z79899 Other long term (current) drug therapy: Secondary | ICD-10-CM | POA: Diagnosis not present

## 2017-10-15 DIAGNOSIS — R000 Tachycardia, unspecified: Secondary | ICD-10-CM | POA: Diagnosis not present

## 2017-10-15 HISTORY — DX: Unspecified atrial fibrillation: I48.91

## 2017-10-15 LAB — CBC
HEMATOCRIT: 35.3 % — AB (ref 36.0–46.0)
HEMOGLOBIN: 11.2 g/dL — AB (ref 12.0–15.0)
MCH: 26 pg (ref 26.0–34.0)
MCHC: 31.7 g/dL (ref 30.0–36.0)
MCV: 82.1 fL (ref 78.0–100.0)
Platelets: 329 10*3/uL (ref 150–400)
RBC: 4.3 MIL/uL (ref 3.87–5.11)
RDW: 15.8 % — AB (ref 11.5–15.5)
WBC: 8.4 10*3/uL (ref 4.0–10.5)

## 2017-10-15 LAB — PROTIME-INR
INR: 0.99
Prothrombin Time: 13 seconds (ref 11.4–15.2)

## 2017-10-15 LAB — BASIC METABOLIC PANEL
Anion gap: 8 (ref 5–15)
BUN: 10 mg/dL (ref 6–20)
CHLORIDE: 100 mmol/L — AB (ref 101–111)
CO2: 26 mmol/L (ref 22–32)
Calcium: 8.9 mg/dL (ref 8.9–10.3)
Creatinine, Ser: 0.68 mg/dL (ref 0.44–1.00)
GFR calc Af Amer: >60 mL/min (ref >60)
GFR calc non Af Amer: >60 mL/min (ref >60)
GLUCOSE: 132 mg/dL — AB (ref 65–99)
POTASSIUM: 4.1 mmol/L (ref 3.5–5.1)
Sodium: 134 mmol/L — ABNORMAL LOW (ref 135–145)

## 2017-10-15 LAB — HCG, SERUM, QUALITATIVE: PREG SERUM: NEGATIVE

## 2017-10-15 LAB — TROPONIN I
Troponin I: <0.03 ng/mL (ref <0.03)
Troponin I: <0.03 ng/mL (ref <0.03)

## 2017-10-15 LAB — APTT: APTT: 31 s (ref 24–36)

## 2017-10-15 LAB — HIV ANTIBODY (ROUTINE TESTING W REFLEX): HIV Screen 4th Generation wRfx: NONREACTIVE

## 2017-10-15 MED ORDER — FLUOXETINE HCL 20 MG PO CAPS
20.0000 mg | ORAL_CAPSULE | Freq: Every day | ORAL | Status: DC
  Administered 2017-10-15 – 2017-10-18 (×4): 20 mg via ORAL
  Filled 2017-10-15 (×4): qty 1

## 2017-10-15 MED ORDER — FLUOXETINE HCL 20 MG PO TABS
20.0000 mg | ORAL_TABLET | Freq: Every day | ORAL | Status: DC
  Administered 2017-10-15: 20 mg via ORAL
  Filled 2017-10-15: qty 1

## 2017-10-15 MED ORDER — ENOXAPARIN SODIUM 100 MG/ML ~~LOC~~ SOLN
100.0000 mg | Freq: Two times a day (BID) | SUBCUTANEOUS | Status: DC
  Administered 2017-10-15 – 2017-10-18 (×8): 100 mg via SUBCUTANEOUS
  Filled 2017-10-15 (×8): qty 1

## 2017-10-15 MED ORDER — HYDROXYZINE HCL 25 MG PO TABS
25.0000 mg | ORAL_TABLET | Freq: Two times a day (BID) | ORAL | Status: DC
  Administered 2017-10-15 – 2017-10-18 (×6): 25 mg via ORAL
  Filled 2017-10-15 (×6): qty 1

## 2017-10-15 MED ORDER — BENZTROPINE MESYLATE 0.5 MG PO TABS
0.5000 mg | ORAL_TABLET | Freq: Two times a day (BID) | ORAL | Status: DC
  Administered 2017-10-15 – 2017-10-18 (×8): 0.5 mg via ORAL
  Filled 2017-10-15 (×8): qty 1

## 2017-10-15 MED ORDER — TRAZODONE HCL 50 MG PO TABS: 25.0000 mg | ORAL_TABLET | Freq: Every evening | ORAL | Status: DC | PRN

## 2017-10-15 MED ORDER — ONDANSETRON HCL 4 MG/2ML IJ SOLN: 4.0000 mg | Freq: Four times a day (QID) | INTRAMUSCULAR | Status: DC | PRN

## 2017-10-15 MED ORDER — METOPROLOL TARTRATE 25 MG PO TABS
12.5000 mg | ORAL_TABLET | Freq: Two times a day (BID) | ORAL | Status: DC
  Administered 2017-10-15 (×2): 12.5 mg via ORAL
  Filled 2017-10-15 (×2): qty 1

## 2017-10-15 MED ORDER — HALOPERIDOL 2 MG PO TABS
2.0000 mg | ORAL_TABLET | Freq: Every day | ORAL | Status: DC
  Administered 2017-10-15 – 2017-10-17 (×3): 2 mg via ORAL
  Filled 2017-10-15 (×4): qty 1

## 2017-10-15 MED ORDER — METOPROLOL TARTRATE 25 MG PO TABS
25.0000 mg | ORAL_TABLET | Freq: Two times a day (BID) | ORAL | Status: DC
  Administered 2017-10-15 – 2017-10-18 (×6): 25 mg via ORAL
  Filled 2017-10-15 (×6): qty 1

## 2017-10-15 MED ORDER — ONDANSETRON HCL 4 MG PO TABS: 4.0000 mg | ORAL_TABLET | Freq: Four times a day (QID) | ORAL | Status: DC | PRN

## 2017-10-15 MED ORDER — ACETAMINOPHEN 325 MG PO TABS
650.0000 mg | ORAL_TABLET | Freq: Four times a day (QID) | ORAL | Status: DC | PRN
  Administered 2017-10-16: 650 mg via ORAL
  Filled 2017-10-15: qty 2

## 2017-10-15 MED ORDER — HYDROXYZINE HCL 25 MG PO TABS: 25.0000 mg | ORAL_TABLET | Freq: Two times a day (BID) | ORAL | Status: DC | PRN

## 2017-10-15 MED ORDER — HALOPERIDOL 2 MG PO TABS
2.0000 mg | ORAL_TABLET | Freq: Every day | ORAL | Status: DC
  Administered 2017-10-15: 2 mg via ORAL
  Filled 2017-10-15: qty 1

## 2017-10-15 MED ORDER — ACETAMINOPHEN 650 MG RE SUPP: 650.0000 mg | Freq: Four times a day (QID) | RECTAL | Status: DC | PRN

## 2017-10-15 NOTE — Progress Notes (Signed)
ANTICOAGULATION CONSULT NOTE - Initial Consult  Pharmacy Consult for enoxaparin Indication: atrial fibrillation  No Known Allergies  Patient Measurements: Height: 5\' 4"  (162.6 cm) Weight: 224 lb (101.6 kg) IBW/kg (Calculated) : 54.7 Heparin Dosing Weight:   Vital Signs: Temp: 97.6 F (36.4 C) (12/19 2100) Temp Source: Oral (12/19 2100) BP: 110/64 (12/20 0106) Pulse Rate: 91 (12/20 0137)  Labs: Recent Labs    10/14/17 1743 10/14/17 2213 10/15/17 0041  HGB 10.5*  --   --   HCT 34.1*  --   --   PLT 297  --   --   APTT  --   --  31  LABPROT  --   --  13.0  INR  --   --  0.99  CREATININE 0.63  --   --   TROPONINI <0.03 <0.03 <0.03    Estimated Creatinine Clearance: 115 mL/min (by C-G formula based on SCr of 0.63 mg/dL).   Medical History: Past Medical History:  Diagnosis Date  . Anxiety   . Asthma   . Chronic back pain   . Schizophrenia (HCC)     Medications:  Scheduled:  . benztropine  0.5 mg Oral BID  . enoxaparin (LOVENOX) injection  100 mg Subcutaneous BID  . FLUoxetine  20 mg Oral Daily  . haloperidol  2 mg Oral QHS  . metoprolol tartrate  12.5 mg Oral BID    Assessment: Patient with afib and MD wants enoxaparin for anticoagulation.    Goal of Therapy:  Anti-Xa level 0.6-1 units/ml 4hrs after LMWH dose given Monitor platelets by anticoagulation protocol: Yes   Plan:  Enoxaparin 100mg  sq q12hr  Darlina GuysGrimsley Jr, Jacquenette ShoneJulian Crowford 10/15/2017,2:28 AM

## 2017-10-15 NOTE — ED Notes (Signed)
Patients day program contact called and stated patient is her own guardian.

## 2017-10-15 NOTE — Progress Notes (Signed)
Patient ID: Kathy Brown, female   DOB: 04/14/1981, 36 y.o.   MRN: [id number]  36 yo female who came to the ED because she was "scared to stay home alone."  No suicidal/homicidal ideations, hallucinations, or substance abuse.  Her mother is her guardian and she has a [employer] tradercare manager via her day program.  Stable from a psychiatric perspective via Dr. Roosvelt HarpsJackie Norman and Nanine MeansJamison Camillo Quadros, PMHNP.  She, however, needs medical admission for her afib issues.    Nanine MeansJamison Corbet Hanley, PMHNP

## 2017-10-15 NOTE — Discharge Instructions (Signed)
For your behavioral health needs, you are advised to continue treatment with [facility]: ° °     [facility] °     [address] °     [place], [place] [address] °     [phone]

## 2017-10-15 NOTE — ED Notes (Signed)
Patient is aware a urine sample is needed. Will call out when able to provide one.

## 2017-10-15 NOTE — H&P (Signed)
History and Physical    Kathy Brown UEA:[id number] DOB: 1981/08/21 DOA: 10/14/2017  [facility]: [name]  Patient coming from: Home.  Chief Complaint: Manic episode.  HPI: Kathy Brown is a 36 y.o. female with history of schizophrenia, chronic back pain and asthma was brought to the ER after patient had a manic episode.  Patient was brought in by [name].  On my exam patient denies any chest pain or shortness of breath palpitation nausea vomiting abdominal pain or diarrhea.  As per the note patient has been having aggressive behavior with hallucinations and also sobbing spells.  At the time of admission to the ER patient did complain of some abdominal discomfort but denies any to me.  ED Course: In the ER patient's lab work showed patient is in A. fib with RVR which is new for the patient.  Patient was given IV metoprolol following which heart rate improved.  Patient is being admitted for further observation.  Patient did receive IV Ativan in the ER for aggression.  Review of Systems: As per HPI, rest all negative.   Past Medical History:  Diagnosis Date  . Anxiety   . Asthma   . Chronic back pain   . Schizophrenia (HCC)     History reviewed. No pertinent surgical history.   reports that  has never smoked. she has never used smokeless tobacco. She reports that she does not drink alcohol or use drugs.  No Known Allergies  Family History  Family history unknown: Yes    Prior to Admission medications   Medication Sig Start Date End Date Taking? Authorizing Provider  FLUoxetine (PROZAC) 20 MG tablet Take 1 tablet (20 mg total) by mouth daily. 12/15/16  Yes Kirichenko, Tatyana, PA-C  hydrOXYzine (ATARAX/VISTARIL) 25 MG tablet Take 1 tablet (25 mg total) by mouth 2 (two) times daily as needed for anxiety. 11/25/16  Yes Garlon HatchetSanders, Lisa M, PA-C  benztropine (COGENTIN) 0.5 MG tablet Take 1 tablet (0.5 mg total) by mouth 2 (two) times daily. 08/25/16   Hedges, Tinnie GensJeffrey,  PA-C  FLUoxetine (PROZAC) 20 MG tablet Take 1 tablet (20 mg total) by mouth daily. Patient not taking: Reported on 10/14/2017 11/25/16   Garlon HatchetSanders, Lisa M, PA-C  haloperidol (HALDOL) 2 MG tablet Take 1 tablet (2 mg total) by mouth at bedtime. 11/25/16   Garlon HatchetSanders, Lisa M, PA-C  haloperidol (HALDOL) 2 MG tablet Take 1 tablet (2 mg total) by mouth at bedtime. 12/15/16   Kirichenko, Tatyana, PA-C  haloperidol decanoate (HALDOL DECANOATE) 100 MG/ML injection Inject 100 mg into the muscle every 30 (thirty) days. 05/08/14   [provider]  hydrOXYzine (ATARAX/VISTARIL) 25 MG tablet Take 1 tablet (25 mg total) by mouth 2 (two) times daily. 12/15/16   Kirichenko, Lemont Fillersatyana, PA-C  traZODone (DESYREL) 50 MG tablet Take 0.5 tablets (25 mg total) by mouth at bedtime as needed for sleep. 04/25/15   Toy Cookeyocherty, Megan, MD    Physical Exam: Vitals:   10/14/17 1834 10/14/17 2100 10/14/17 2210 10/14/17 2308  BP: (!) 146/88 (!) 159/102 (!) 140/98 (!) 128/96  Pulse: 98 73 85 (!) 102  Resp: 18 18 12  (!) 22  Temp: 98.2 F (36.8 C) 97.6 F (36.4 C)    TempSrc: Oral Oral    SpO2: 97% 100% 99% 99%      Constitutional: Moderately built and nourished. Vitals:   10/14/17 1834 10/14/17 2100 10/14/17 2210 10/14/17 2308  BP: (!) 146/88 (!) 159/102 (!) 140/98 (!) 128/96  Pulse: 98 73  85 (!) 102  Resp: 18 18 12  (!) 22  Temp: 98.2 F (36.8 C) 97.6 F (36.4 C)    TempSrc: Oral Oral    SpO2: 97% 100% 99% 99%   Eyes: Anicteric no pallor. ENMT: No discharge from the ears eyes nose or mouth. Neck: No mass felt.  No neck rigidity.  No JVD appreciated. Respiratory: Soft nontender bowel sounds present. Cardiovascular: S1-S2 heard no murmurs appreciated. Abdomen: Soft nontender bowel sounds present. Musculoskeletal: No edema.  No joint effusion. Skin: No rash.  Skin appears warm. Neurologic: Alert awake oriented to time place and person.  Moves all extremities 5 x 5. Psychiatric: At the time of my exam patient is  alert awake oriented to name and place.   Labs on Admission: I have personally reviewed following labs and imaging studies  CBC: Recent Labs  Lab 10/14/17 1743  WBC 7.8  NEUTROABS 4.9  HGB 10.5*  HCT 34.1*  MCV 82.4  PLT 297   Basic Metabolic Panel: Recent Labs  Lab 10/14/17 1743  NA 135  K 4.0  CL 103  CO2 24  GLUCOSE 88  BUN 11  CREATININE 0.63  CALCIUM 9.2  MG 1.7   GFR: CrCl cannot be calculated (Unknown ideal weight.). Liver Function Tests: Recent Labs  Lab 10/14/17 1743  AST 15  ALT 10*  ALKPHOS 57  BILITOT 0.7  PROT 7.2  ALBUMIN 3.5   No results for input(s): LIPASE, AMYLASE in the last 168 hours. No results for input(s): AMMONIA in the last 168 hours. Coagulation Profile: No results for input(s): INR, PROTIME in the last 168 hours. Cardiac Enzymes: Recent Labs  Lab 10/14/17 1743 10/14/17 2213  TROPONINI <0.03 <0.03   BNP (last 3 results) No results for input(s): PROBNP in the last 8760 hours. HbA1C: No results for input(s): HGBA1C in the last 72 hours. CBG: No results for input(s): GLUCAP in the last 168 hours. Lipid Profile: No results for input(s): CHOL, HDL, LDLCALC, TRIG, CHOLHDL, LDLDIRECT in the last 72 hours. Thyroid Function Tests: Recent Labs    10/14/17 1743  TSH 4.197   Anemia Panel: No results for input(s): VITAMINB12, FOLATE, FERRITIN, TIBC, IRON, RETICCTPCT in the last 72 hours. Urine analysis:    Component Value Date/Time   COLORURINE YELLOW 01/09/2016 0952   APPEARANCEUR CLEAR 01/09/2016 0952   LABSPEC 1.003 (L) 01/09/2016 0952   PHURINE 6.5 01/09/2016 0952   GLUCOSEU NEGATIVE 01/09/2016 0952   HGBUR NEGATIVE 01/09/2016 0952   BILIRUBINUR NEGATIVE 01/09/2016 0952   KETONESUR NEGATIVE 01/09/2016 0952   PROTEINUR NEGATIVE 01/09/2016 0952   UROBILINOGEN 0.2 09/20/2014 1135   NITRITE NEGATIVE 01/09/2016 0952   LEUKOCYTESUR NEGATIVE 01/09/2016 0952   Sepsis Labs: @LABRCNTIP (procalcitonin:4,lacticidven:4) )No  results found for this or any previous visit (from the past 240 hour(s)).   Radiological Exams on Admission: No results found.  EKG: Independently reviewed.  A. fib with RVR.  Assessment/Plan Active Problems:   Schizoaffective disorder (HCC)   Paranoid schizophrenia (HCC)   Atrial fibrillation with RVR (HCC)    1. A. fib with RVR new onset -duration not known.  Heart rate improved with IV metoprolol.  I have placed patient on metoprolol 12.5 twice daily.  TSH is normal cycle cardiac markers check 2D echo.  Patient's chads 2 vasc score is 1 for female.  For now I have kept patient on Lovenox in case patient requires cardioversion.  I have consulted cardiology. 2. Schizophrenia -we will continue home medications and please consult psychiatry in  a.m. 3. History of asthma presently not wheezing.  Pregnancy screen is pending.   DVT prophylaxis: Lovenox full dose of A. fib. Code Status: Full code. Family Communication: No family at the bedside. Disposition Plan: To be determined. Consults called: Cardiology. Admission status: Observation.   Eduard Clos MD [facility] Pager [phone].  If 7PM-7AM, please contact night-coverage [email or web address] Password [email or web address]  10/15/2017, 12:19 AM

## 2017-10-15 NOTE — BH Assessment (Signed)
[facility] Assessment Progress Note  Per Juanetta BeetsJacqueline Norman, DO, [facility] and is [facility]rically cleared.  [facility]  This has been included in pt's discharge instructions.  Pt's nurse has been notified.  Doylene Canninghomas Stepfon Rawles, MA Triage Specialist [phone]

## 2017-10-15 NOTE — Progress Notes (Signed)
[facility] PROGRESS NOTE    Progress Note  Kathy Brown  ZOX:[id number] DOB: 22-Jun-1981 DOA: 10/14/2017 [facility]CP: [name]     Brief Narrative:   Kathy Brown is an 36 y.o. female past medical history of schizophrenia, chronic back pain [facility] after a manic episode.  The ED lead EKG showed atrial fibrillation with RVR patient was given metoprolol and heart rate improved.  Assessment/Plan:   Atrial fibrillation with RVR (HCC): Heart rate controlled on metoprolol IV twice daily, she was started on Lovenox chads 2 vasc score is 1  UDS negative, pregnancy test negative.  Awaiting cardiology's recommendations.  Schizoaffective disorder (HCC)/  Paranoid schizophrenia [facility](HCC) Consult psychiatry.   DVT prophylaxis: LOVENOX  Family Communication:none Disposition Plan/Barrier to D/C: unable to determine Code Status:     Code Status Orders  (From admission, onward)        Start     Ordered   10/15/17 0018  Full code  Continuous     10/15/17 0018    Code Status History    Date Active Date Inactive Code Status Order ID Comments User Context   02/08/2017 01:24 02/08/2017 03:02 Full Code [id number]  Zadie RhineWickline, Donald, MD ED   01/05/2016 16:13 01/06/2016 03:59 Full Code [id number]  Melton Krebsiley, Samantha Nicole, PA-C ED   12/29/2015 08:43 12/29/2015 20:01 Full Code [id number]  Arthor CaptainHarris, Abigail, PA-C ED   05/11/2014 12:51 05/11/2014 16:57 Full Code [id number]  Raeford RazorKohut, Stephen, MD ED   03/25/2014 12:58 03/28/2014 16:54 Full Code [id number]  Nanine MeansLord, Jamison, NP Inpatient   03/24/2014 19:16 03/25/2014 12:58 Full Code [id number]  Suzi RootsSteinl, Kevin E, MD ED   01/29/2014 10:38 01/29/2014 17:17 Full Code [id number]  Ethelda ChickLinker, Martha K, MD ED        IV Access:    Peripheral IV   Procedures and diagnostic studies:   Dg Chest Port 1 View  Result Date: 10/15/2017 CLINICAL DATA:  Tachycardia EXAM: PORTABLE CHEST 1 VIEW COMPARISON:  None. FINDINGS:  The heart size and mediastinal contours are within normal limits. Both lungs are clear. The visualized skeletal structures are unremarkable. IMPRESSION: No active disease. Electronically Signed   By: Deatra RobinsonKevin  Herman M.D.   On: 10/15/2017 01:56     Medical Consultants:    None.  Anti-Infectives:   None  Subjective:    Kathy Brown no complains  Objective:    Vitals:   10/15/17 0226 10/15/17 0308 10/15/17 0400 10/15/17 0727  BP:  132/83 122/65 138/89  Pulse:  70 65 83  Resp:  19 18 15   Temp:      TempSrc:      SpO2:  100% 97% 97%  Weight: 101.6 kg (224 lb)     Height: 5\' 4"  (1.626 m)       Intake/Output Summary (Last 24 hours) at 10/15/2017 0925 Last data filed at 10/15/2017 0800 Gross per 24 hour  Intake 300 ml  Output -  Net 300 ml   Filed Weights   10/15/17 0226  Weight: 101.6 kg (224 lb)    Exam: General exam: In no acute distress. Respiratory system: Good air movement and clear to auscultation. Cardiovascular system: S1 & S2 heard, RRR.  Gastrointestinal system: Abdomen is nondistended, soft and nontender.  Central nervous system: Alert and oriented. No focal neurological deficits. Extremities: No pedal edema. Skin: No rashes, lesions or ulcers   Data Reviewed:    Labs: Basic Metabolic Panel: Recent Labs  Lab 10/14/17 1743 10/15/17  0809  NA 135 134*  K 4.0 4.1  CL 103 100*  CO2 24 26  GLUCOSE 88 132*  BUN 11 10  CREATININE 0.63 0.68  CALCIUM 9.2 8.9  MG 1.7  --    GFR Estimated Creatinine Clearance: 115 mL/min (by C-G formula based on SCr of 0.68 mg/dL). Liver Function Tests: Recent Labs  Lab 10/14/17 1743  AST 15  ALT 10*  ALKPHOS 57  BILITOT 0.7  PROT 7.2  ALBUMIN 3.5   No results for input(s): LIPASE, AMYLASE in the last 168 hours. No results for input(s): AMMONIA in the last 168 hours. Coagulation profile Recent Labs  Lab 10/15/17 0041  INR 0.99    CBC: Recent Labs  Lab 10/14/17 1743 10/15/17 0809  WBC 7.8  8.4  NEUTROABS 4.9  --   HGB 10.5* 11.2*  HCT 34.1* 35.3*  MCV 82.4 82.1  PLT 297 329   Cardiac Enzymes: Recent Labs  Lab 10/14/17 1743 10/14/17 2213 10/15/17 0041  TROPONINI <0.03 <0.03 <0.03   BNP (last 3 results) No results for input(s): PROBNP in the last 8760 hours. CBG: No results for input(s): GLUCAP in the last 168 hours. D-Dimer: No results for input(s): DDIMER in the last 72 hours. Hgb A1c: No results for input(s): HGBA1C in the last 72 hours. Lipid Profile: No results for input(s): CHOL, HDL, LDLCALC, TRIG, CHOLHDL, LDLDIRECT in the last 72 hours. Thyroid function studies: Recent Labs    10/14/17 1743  TSH 4.197   Anemia work up: No results for input(s): VITAMINB12, FOLATE, FERRITIN, TIBC, IRON, RETICCTPCT in the last 72 hours. Sepsis Labs: Recent Labs  Lab 10/14/17 1743 10/15/17 0809  WBC 7.8 8.4   Microbiology No results found for this or any previous visit (from the past 240 hour(s)).   Medications:   . benztropine  0.5 mg Oral BID  . enoxaparin (LOVENOX) injection  100 mg Subcutaneous BID  . FLUoxetine  20 mg Oral Daily  . haloperidol  2 mg Oral QHS  . metoprolol tartrate  12.5 mg Oral BID   Continuous Infusions:    LOS: 0 days   Marinda ElkAbraham Feliz Ortiz  [facility] Pager [phone]  *Please refer to [email or web address], password [email or web address] to get updated schedule on who will round on this patient, as hospitalists switch teams weekly. If 7PM-7AM, please contact night-coverage at [email or web address], password [email or web address] for any overnight needs.  10/15/2017, 9:25 AM

## 2017-10-15 NOTE — ED Notes (Signed)
Per PA, ok to not complete pregnancy, urine because beta was completed and was negative.

## 2017-10-15 NOTE — ED Notes (Signed)
Assigned 1414 at 13:07 call report @ 13:27

## 2017-10-15 NOTE — ED Notes (Signed)
Report given to floor rn 

## 2017-10-15 NOTE — Care Management Obs Status (Signed)
MEDICARE OBSERVATION STATUS NOTIFICATION   Patient Details  Name: Kathy Brown MRN: [id number] Date of Birth: Mar 23, 1981   Medicare Observation Status Notification Given:  Yes    Kathy Brown, Lynnae Sandhoffngela N, RN 10/15/2017, 12:42 PM

## 2017-10-15 NOTE — Consult Note (Signed)
Cardiology Consultation:   Patient ID: Kathy Brown; [id number]; 1981/03/30   Admit date: 10/14/2017 Date of Consult: 10/15/2017  Primary Care Provider: [facility] Primary Cardiologist: NEW Primary Electrophysiologist:  NA   Patient Profile:   Kathy Brown is a 36 y.o. female with a hx of schizoaffective disorder found by EMS to be in manic state and paronioa who is being seen today for the evaluation of atrial fib at the request of Dr. Robb Matar.  History of Present Illness:   Ms. Farina has a hx of schizoaffective disorder found by EMS to be in manic state and paronioa and brought to [facility] and found to be in atrial fib with RVR.  Here in ER she was given IV metoprolol with improvement in HR.  She also rec'd IV ativan for aggression.  No prior cardiac hx.   She was placed on metoprolol BID IV and placed on Lovenox therapeutic dose.   CHA2DS2VASC score in 1 for female  EKG  I personally reviewed a fib with RVR  No old EKGs to compare.  Troponin I <0.03 X 4 Na 134, K+ 4.1, Cr. 0.68  Hgb 11.2 Hct 35 TSH 4.197 CXR no active disease Preg test neg.   Currently no complaints.  No chest pain and no SOB. Not aware her HR is fast.     Past Medical History:  Diagnosis Date  . Anxiety   . Asthma   . Chronic back pain   . Schizophrenia (HCC)     History reviewed. No pertinent surgical history.   Home Medications:  Prior to Admission medications   Medication Sig Start Date End Date Taking? Authorizing Provider  FLUoxetine (PROZAC) 20 MG tablet Take 1 tablet (20 mg total) by mouth daily. 12/15/16  Yes Kirichenko, Tatyana, PA-C  hydrOXYzine (ATARAX/VISTARIL) 25 MG tablet Take 1 tablet (25 mg total) by mouth 2 (two) times daily as needed for anxiety. 11/25/16  Yes Garlon Hatchet, PA-C  benztropine (COGENTIN) 0.5 MG tablet Take 1 tablet (0.5 mg total) by mouth 2 (two) times daily. 08/25/16   Hedges, Tinnie Gens, PA-C  FLUoxetine (PROZAC) 20 MG tablet  Take 1 tablet (20 mg total) by mouth daily. Patient not taking: Reported on 10/14/2017 11/25/16   Garlon Hatchet, PA-C  haloperidol (HALDOL) 2 MG tablet Take 1 tablet (2 mg total) by mouth at bedtime. 11/25/16   Garlon Hatchet, PA-C  haloperidol (HALDOL) 2 MG tablet Take 1 tablet (2 mg total) by mouth at bedtime. 12/15/16   Kirichenko, Tatyana, PA-C  haloperidol decanoate (HALDOL DECANOATE) 100 MG/ML injection Inject 100 mg into the muscle every 30 (thirty) days. 05/08/14   [provider]  hydrOXYzine (ATARAX/VISTARIL) 25 MG tablet Take 1 tablet (25 mg total) by mouth 2 (two) times daily. 12/15/16   Kirichenko, Lemont Fillers, PA-C  traZODone (DESYREL) 50 MG tablet Take 0.5 tablets (25 mg total) by mouth at bedtime as needed for sleep. 04/25/15   Toy Cookey, MD    Inpatient Medications: Scheduled Meds: . benztropine  0.5 mg Oral BID  . enoxaparin (LOVENOX) injection  100 mg Subcutaneous BID  . FLUoxetine  20 mg Oral Daily  . haloperidol  2 mg Oral QHS  . metoprolol tartrate  12.5 mg Oral BID   Continuous Infusions:  PRN Meds: acetaminophen **OR** acetaminophen, hydrOXYzine, ondansetron **OR** ondansetron (ZOFRAN) IV, traZODone  Allergies:   No Known Allergies  Social History:   Social History   Socioeconomic History  . Marital status: Single  Spouse name: Not on file  . Number of children: Not on file  . Years of education: Not on file  . Highest education level: Not on file  Social Needs  . Financial resource strain: Not on file  . Food insecurity - worry: Not on file  . Food insecurity - inability: Not on file  . Transportation needs - medical: Not on file  . Transportation needs - non-medical: Not on file  Occupational History  . Not on file  Tobacco Use  . Smoking status: Never Smoker  . Smokeless tobacco: Never Used  Substance and Sexual Activity  . Alcohol use: No  . Drug use: No  . Sexual activity: Yes  Other Topics Concern  . Not on file  Social History  Narrative  . Not on file    Family History:  No FH CAD  Family History  Problem Relation Age of Onset  . Hypertension Mother   . Diabetes Mother      ROS:  Please see the history of present illness.  ROS  General:no colds or fevers, no weight changes Skin:no rashes or ulcers HEENT:no blurred vision, no congestion CV:see HPI PUL:see HPI GI:no diarrhea constipation stated she has blood in her stools at times, no indigestion GU:no hematuria, no dysuria MS:no joint pain, no claudication Neuro:no syncope, no lightheadedness Endo:no diabetes, no thyroid disease Psychiatric: currently not manic      Physical Exam/Data:   Vitals:   10/15/17 0226 10/15/17 0308 10/15/17 0400 10/15/17 0727  BP:  132/83 122/65 138/89  Pulse:  70 65 83  Resp:  19 18 15   Temp:      TempSrc:      SpO2:  100% 97% 97%  Weight: 224 lb (101.6 kg)     Height: 5\' 4"  (1.626 m)       Intake/Output Summary (Last 24 hours) at 10/15/2017 1204 Last data filed at 10/15/2017 0800 Gross per 24 hour  Intake 300 ml  Output -  Net 300 ml   Filed Weights   10/15/17 0226  Weight: 224 lb (101.6 kg)   Body mass index is 38.45 kg/m.  General:  Well nourished, well developed, in no acute distress HEENT: normal- poor dentation Lymph: no adenopathy Neck: no JVD Endocrine:  No thryomegaly Vascular: No carotid bruits; 2+ pedal pulses bil Cardiac:  irreg irreg; no murmur gallup rub or click Lungs:  clear to auscultation bilaterally, no wheezing, rhonchi or rales  Abd: soft, nontender, no hepatomegaly  Ext: no edema Musculoskeletal:  No deformities, BUE and BLE strength normal and equal Skin: warm and dry  Neuro:  Alert and oriented, MAE, follows commands no focal abnormalities noted Psych:  Normal affect to flat - no longer manic or sobbing   Relevant CV Studies: ECHO has been ordered.  Laboratory Data:  Chemistry Recent Labs  Lab 10/14/17 1743 10/15/17 0809  NA 135 134*  K 4.0 4.1  CL 103 100*    CO2 24 26  GLUCOSE 88 132*  BUN 11 10  CREATININE 0.63 0.68  CALCIUM 9.2 8.9  GFRNONAA >60 >60  GFRAA >60 >60  ANIONGAP 8 8    Recent Labs  Lab 10/14/17 1743  PROT 7.2  ALBUMIN 3.5  AST 15  ALT 10*  ALKPHOS 57  BILITOT 0.7   Hematology Recent Labs  Lab 10/14/17 1743 10/15/17 0809  WBC 7.8 8.4  RBC 4.14 4.30  HGB 10.5* 11.2*  HCT 34.1* 35.3*  MCV 82.4 82.1  MCH 25.4* 26.0  MCHC 30.8 31.7  RDW 15.6* 15.8*  PLT 297 329   Cardiac Enzymes Recent Labs  Lab 10/14/17 1743 10/14/17 2213 10/15/17 0041 10/15/17 0809  TROPONINI <0.03 <0.03 <0.03 <0.03   No results for input(s): TROPIPOC in the last 168 hours.  BNPNo results for input(s): BNP, PROBNP in the last 168 hours.  DDimer No results for input(s): DDIMER in the last 168 hours.  Radiology/Studies:  Dg Chest Port 1 View  Result Date: 10/15/2017 CLINICAL DATA:  Tachycardia EXAM: PORTABLE CHEST 1 VIEW COMPARISON:  None. FINDINGS: The heart size and mediastinal contours are within normal limits. Both lungs are clear. The visualized skeletal structures are unremarkable. IMPRESSION: No active disease. Electronically Signed   By: Deatra RobinsonKevin  Herman M.D.   On: 10/15/2017 01:56    Assessment and Plan:   1. A fib with RVR now rate mostly controlled, cha2DS2VASc is 1: currently on IV metoprolol and subcu lovenox 100 every 12 hours.  Neg troponin.  Control rate and check Echo.  She was not aware she was in a fib.  ? TEE DCCV and anticoagulation for 4 weeks?  Dr Mayford Knifeurner to see.   2. Schizoaffective disorder and paranoid schizophrenia [facility] has seen and does not need psychiatric hospitalization.     For questions or updates, please contact [facility] Please consult [email or web address] for contact info under Cardiology/STEMI.   Signed, Nada BoozerLaura Makenzee Choudhry, NP  10/15/2017 12:04 PM

## 2017-10-15 NOTE — ED Notes (Signed)
RN Artistnotified writer they would obtain labs from IV.

## 2017-10-15 NOTE — ED Notes (Signed)
Bed: WA29 Expected date:  Expected time:  Means of arrival:  Comments: Room 10

## 2017-10-16 ENCOUNTER — Inpatient Hospital Stay (HOSPITAL_COMMUNITY): Payer: Medicare Other

## 2017-10-16 LAB — ECHOCARDIOGRAM COMPLETE
HEIGHTINCHES: 64 in
Weight: 3583.8 oz

## 2017-10-16 MED ORDER — DILTIAZEM HCL 100 MG IV SOLR
5.0000 mg/h | INTRAVENOUS | Status: DC
  Administered 2017-10-16: 5 mg/h via INTRAVENOUS
  Filled 2017-10-16: qty 100

## 2017-10-16 MED ORDER — DILTIAZEM LOAD VIA INFUSION
10.0000 mg | Freq: Once | INTRAVENOUS | Status: AC
  Administered 2017-10-16: 10 mg via INTRAVENOUS
  Filled 2017-10-16: qty 10

## 2017-10-16 NOTE — Progress Notes (Signed)
[facility] PROGRESS NOTE    Progress Note  Kathy SarkLatasha Brown  ZOX:[id number] DOB: [birth year] DOA: 10/14/2017 PCP: [name]     Brief Narrative:   Kathy Brown is an 36 y.o. female past medical history of schizophrenia, chronic back pain [facility] after a [facility] episode.  The ED lead EKG showed atrial fibrillation with RVR patient was given metoprolol and heart rate improved.  Assessment/Plan:   Atrial fibrillation with RVR (HCC): Heart rate not well controlled, now on IV diltiazem continue IV heparin chads 2 vasc score is 1  UDS negative, pregnancy test negative.  Awaiting cardiology's recommendations.  Schizoaffective disorder (HCC)/  Paranoid schizophrenia (HCC) Consulted psychiatry, they recommended to resume home meds.   DVT prophylaxis: LOVENOX  Family Communication:none Disposition Plan/Barrier to D/C: unable to determine Code Status:     Code Status Orders  (From admission, onward)        Start     Ordered   10/15/17 0018  Full code  Continuous     10/15/17 0018    Code Status History    Date Active Date Inactive Code Status Order ID Comments User Context   02/08/2017 01:24 02/08/2017 03:02 Full Code [id number]  Zadie RhineWickline, Donald, MD ED   01/05/2016 16:13 01/06/2016 03:59 Full Code [id number]  Melton Krebsiley, Samantha Nicole, PA-C ED   12/29/2015 08:43 12/29/2015 20:01 Full Code [id number]  Arthor CaptainHarris, Abigail, PA-C ED   05/11/2014 12:51 05/11/2014 16:57 Full Code [id number]  Raeford RazorKohut, Stephen, MD ED   03/25/2014 12:58 03/28/2014 16:54 Full Code [id number]  Nanine MeansLord, Jamison, NP Inpatient   03/24/2014 19:16 03/25/2014 12:58 Full Code [id number]  Suzi RootsSteinl, Kevin E, MD ED   01/29/2014 10:38 01/29/2014 17:17 Full Code [id number]  Ethelda ChickLinker, Martha K, MD ED        IV Access:    Peripheral IV   Procedures and diagnostic studies:   Dg Chest Port 1 View  Result Date: 10/15/2017 CLINICAL DATA:  Tachycardia EXAM: PORTABLE CHEST 1  VIEW COMPARISON:  None. FINDINGS: The heart size and mediastinal contours are within normal limits. Both lungs are clear. The visualized skeletal structures are unremarkable. IMPRESSION: No active disease. Electronically Signed   By: Deatra RobinsonKevin  Herman M.D.   On: 10/15/2017 01:56     Medical Consultants:    None.  Anti-Infectives:   None  Subjective:    Kathy Brown no complains  Objective:    Vitals:   10/15/17 1220 10/15/17 1346 10/15/17 2210 10/16/17 0550  BP: 126/86 128/84 112/72 128/72  Pulse: 82 (!) 115 92 90  Resp: (!) 24 19 18 17   Temp: 97.7 F (36.5 C) 98.3 F (36.8 C) (!) 97.5 F (36.4 C) 98.7 F (37.1 C)  TempSrc:  Oral Oral Oral  SpO2:  99% 100% 98%  Weight:  101.6 kg (223 lb 15.8 oz)    Height:  5\' 4"  (1.626 m)     No intake or output data in the 24 hours ending 10/16/17 1156 Filed Weights   10/15/17 0226 10/15/17 1346  Weight: 101.6 kg (224 lb) 101.6 kg (223 lb 15.8 oz)    Exam: General exam: In no acute distress. Respiratory system: Good air movement and clear to auscultation. Cardiovascular system: S1 & S2 heard, irregular HR Gastrointestinal system: Abdomen is nondistended, soft and nontender.  Central nervous system: Alert and oriented. No focal neurological deficits. Extremities: No pedal edema. Skin: No rashes, lesions or ulcers   Data Reviewed:    Labs:  Basic Metabolic Panel: Recent Labs  Lab 10/14/17 1743 10/15/17 0809  NA 135 134*  K 4.0 4.1  CL 103 100*  CO2 24 26  GLUCOSE 88 132*  BUN 11 10  CREATININE 0.63 0.68  CALCIUM 9.2 8.9  MG 1.7  --    GFR Estimated Creatinine Clearance: 115 mL/min (by C-G formula based on SCr of 0.68 mg/dL). Liver Function Tests: Recent Labs  Lab 10/14/17 1743  AST 15  ALT 10*  ALKPHOS 57  BILITOT 0.7  PROT 7.2  ALBUMIN 3.5   No results for input(s): LIPASE, AMYLASE in the last 168 hours. No results for input(s): AMMONIA in the last 168 hours. Coagulation profile Recent Labs  Lab  10/15/17 0041  INR 0.99    CBC: Recent Labs  Lab 10/14/17 1743 10/15/17 0809  WBC 7.8 8.4  NEUTROABS 4.9  --   HGB 10.5* 11.2*  HCT 34.1* 35.3*  MCV 82.4 82.1  PLT 297 329   Cardiac Enzymes: Recent Labs  Lab 10/14/17 1743 10/14/17 2213 10/15/17 0041 10/15/17 0809 10/15/17 1410  TROPONINI <0.03 <0.03 <0.03 <0.03 <0.03   BNP (last 3 results) No results for input(s): PROBNP in the last 8760 hours. CBG: No results for input(s): GLUCAP in the last 168 hours. D-Dimer: No results for input(s): DDIMER in the last 72 hours. Hgb A1c: No results for input(s): HGBA1C in the last 72 hours. Lipid Profile: No results for input(s): CHOL, HDL, LDLCALC, TRIG, CHOLHDL, LDLDIRECT in the last 72 hours. Thyroid function studies: Recent Labs    10/14/17 1743  TSH 4.197   Anemia work up: No results for input(s): VITAMINB12, FOLATE, FERRITIN, TIBC, IRON, RETICCTPCT in the last 72 hours. Sepsis Labs: Recent Labs  Lab 10/14/17 1743 10/15/17 0809  WBC 7.8 8.4   Microbiology No results found for this or any previous visit (from the past 240 hour(s)).   Medications:   . benztropine  0.5 mg Oral BID  . diltiazem  10 mg Intravenous Once  . enoxaparin (LOVENOX) injection  100 mg Subcutaneous BID  . FLUoxetine  20 mg Oral Daily  . haloperidol  2 mg Oral QHS  . hydrOXYzine  25 mg Oral BID  . metoprolol tartrate  25 mg Oral BID   Continuous Infusions: . diltiazem (CARDIZEM) infusion        LOS: 1 day   Marinda ElkAbraham Feliz Ortiz  [facility] Pager [phone]  *Please refer to [email or web address], password [email or web address] to get updated schedule on who will round on this patient, as hospitalists switch teams weekly. If 7PM-7AM, please contact night-coverage at [email or web address], password [email or web address] for any overnight needs.  10/16/2017, 11:56 AM

## 2017-10-16 NOTE — Progress Notes (Signed)
2D Echocardiogram has been performed.  Kathy PartridgeBrooke S Henretter Brown 10/16/2017, 11:45 AM

## 2017-10-16 NOTE — Progress Notes (Signed)
Progress Note  Patient Name: Kathy Brown Date of Encounter: 10/16/2017  Primary Cardiologist: None  Subjective   Denies any chest pain, SOB, palpitations.  HR poorly controlled and at times up in the 160's  Inpatient Medications    Scheduled Meds: . benztropine  0.5 mg Oral BID  . enoxaparin (LOVENOX) injection  100 mg Subcutaneous BID  . FLUoxetine  20 mg Oral Daily  . haloperidol  2 mg Oral QHS  . hydrOXYzine  25 mg Oral BID  . metoprolol tartrate  25 mg Oral BID   Continuous Infusions:  PRN Meds: acetaminophen **OR** acetaminophen, hydrOXYzine, ondansetron **OR** ondansetron (ZOFRAN) IV, traZODone   Vital Signs    Vitals:   10/15/17 1220 10/15/17 1346 10/15/17 2210 10/16/17 0550  BP: 126/86 128/84 112/72 128/72  Pulse: 82 (!) 115 92 90  Resp: (!) 24 19 18 17   Temp: 97.7 F (36.5 C) 98.3 F (36.8 C) (!) 97.5 F (36.4 C) 98.7 F (37.1 C)  TempSrc:  Oral Oral Oral  SpO2:  99% 100% 98%  Weight:  223 lb 15.8 oz (101.6 kg)    Height:  5\' 4"  (1.626 m)     No intake or output data in the 24 hours ending 10/16/17 1052 Filed Weights   10/15/17 0226 10/15/17 1346  Weight: 224 lb (101.6 kg) 223 lb 15.8 oz (101.6 kg)    Telemetry    Atrial fibrillation with RVR - Personally Reviewed  ECG    No new EKG to review - Personally Reviewed  Physical Exam   GEN: No acute distress.   Neck: No JVD Cardiac: irregularly irregular and tachy, no murmurs, rubs, or gallops.  Respiratory: Clear to auscultation bilaterally. GI: Soft, nontender, non-distended  MS: No edema; No deformity. Neuro:  Nonfocal  Psych: Normal affect   Labs    Chemistry Recent Labs  Lab 10/14/17 1743 10/15/17 0809  NA 135 134*  K 4.0 4.1  CL 103 100*  CO2 24 26  GLUCOSE 88 132*  BUN 11 10  CREATININE 0.63 0.68  CALCIUM 9.2 8.9  PROT 7.2  --   ALBUMIN 3.5  --   AST 15  --   ALT 10*  --   ALKPHOS 57  --   BILITOT 0.7  --   GFRNONAA >60 >60  GFRAA >60 >60  ANIONGAP 8 8      Hematology Recent Labs  Lab 10/14/17 1743 10/15/17 0809  WBC 7.8 8.4  RBC 4.14 4.30  HGB 10.5* 11.2*  HCT 34.1* 35.3*  MCV 82.4 82.1  MCH 25.4* 26.0  MCHC 30.8 31.7  RDW 15.6* 15.8*  PLT 297 329    Cardiac Enzymes Recent Labs  Lab 10/14/17 2213 10/15/17 0041 10/15/17 0809 10/15/17 1410  TROPONINI <0.03 <0.03 <0.03 <0.03   No results for input(s): TROPIPOC in the last 168 hours.   BNPNo results for input(s): BNP, PROBNP in the last 168 hours.   DDimer No results for input(s): DDIMER in the last 168 hours.   Radiology    Dg Chest Port 1 View  Result Date: 10/15/2017 CLINICAL DATA:  Tachycardia EXAM: PORTABLE CHEST 1 VIEW COMPARISON:  None. FINDINGS: The heart size and mediastinal contours are within normal limits. Both lungs are clear. The visualized skeletal structures are unremarkable. IMPRESSION: No active disease. Electronically Signed   By: Deatra RobinsonKevin  Herman M.D.   On: 10/15/2017 01:56    Cardiac Studies   none  Patient Profile     36 y.o. female  with a hx of schizoaffective disorder found by EMS to be in manic state and paronioa who is being seen today for the evaluation of atrial fib     Assessment & Plan    1.New onset afib with RVR with cha2DS2VASc  1 - HR poorly controlled on PO Lopressor - will start IV Cardizem gtt with 10mg  bolus - continue full dose Lovenox - if cannot get rate controlled on Cardizem and BB many need to consider TEE/DCCV - 2D echo today once HR controlled better - TSH is normal  2. Schizoaffective disorder and paranoid schizophrenia [facility] has seen and does not need psychiatric hospitalization    For questions or updates, please contact [facility] Please consult [email or web address] for contact info under Cardiology/STEMI.      Signed, Armanda Magicraci Nillie Bartolotta, MD  10/16/2017, 10:52 AM

## 2017-10-17 LAB — GLUCOSE, CAPILLARY: Glucose-Capillary: 121 mg/dL — ABNORMAL HIGH (ref 65–99)

## 2017-10-17 MED ORDER — LORAZEPAM 2 MG/ML IJ SOLN
1.0000 mg | Freq: Four times a day (QID) | INTRAMUSCULAR | Status: DC | PRN
  Administered 2017-10-17: 1 mg via INTRAVENOUS
  Filled 2017-10-17: qty 1

## 2017-10-17 MED ORDER — DILTIAZEM HCL 30 MG PO TABS
30.0000 mg | ORAL_TABLET | Freq: Four times a day (QID) | ORAL | Status: DC
Start: 2017-10-17 — End: 2017-10-18
  Administered 2017-10-17 – 2017-10-18 (×4): 30 mg via ORAL
  Filled 2017-10-17 (×4): qty 1

## 2017-10-17 NOTE — Progress Notes (Signed)
[facility] PROGRESS NOTE    Progress Note  Kathy Brown  WUJ:[id number] DOB: 1981/10/27 DOA: 10/14/2017 PCP: [facility], [name]     Brief Narrative:   Kathy Brown is an 36 y.o. female past medical history of schizophrenia, chronic back pain [facility].  The ED lead EKG showed atrial fibrillation with RVR patient was given metoprolol and heart rate improved.  Assessment/Plan:   Atrial fibrillation with RVR (HCC): Heart rate not well controlled, cardiology managing HR and difficult to control. chads 2 vasc score is 1  UDS negative, pregnancy test negative.  Awaiting cardiology's further recommendations.  Schizoaffective disorder (HCC)/  Paranoid schizophrenia (HCC) Consulted psychiatry, they recommended to resume home meds.   DVT prophylaxis: LOVENOX  Family Communication:none Disposition Plan/Barrier to D/C: unable to determine Code Status:     Code Status Orders  (From admission, onward)        Start     Ordered   10/15/17 0018  Full code  Continuous     10/15/17 0018    Code Status History    Date Active Date Inactive Code Status Order ID Comments User Context   02/08/2017 01:24 02/08/2017 03:02 Full Code [id number]  Zadie RhineWickline, Donald, MD ED   01/05/2016 16:13 01/06/2016 03:59 Full Code [id number]  Melton Krebsiley, Samantha Nicole, PA-C ED   12/29/2015 08:43 12/29/2015 20:01 Full Code [id number]  Arthor CaptainHarris, Abigail, PA-C ED   05/11/2014 12:51 05/11/2014 16:57 Full Code [id number]  Raeford RazorKohut, Stephen, MD ED   03/25/2014 12:58 03/28/2014 16:54 Full Code [id number]  Nanine MeansLord, Jamison, NP Inpatient   03/24/2014 19:16 03/25/2014 12:58 Full Code [id number]  Suzi RootsSteinl, Kevin E, MD ED   01/29/2014 10:38 01/29/2014 17:17 Full Code [id number]  Ethelda ChickLinker, Martha K, MD ED        IV Access:    Peripheral IV   Procedures and diagnostic studies:   No results found.   Medical Consultants:    None.  Anti-Infectives:    None  Subjective:    Kathy Brown no complains  Objective:    Vitals:   10/16/17 1521 10/16/17 1541 10/16/17 2221 10/17/17 0513  BP: (!) 88/56 102/62 108/66 109/69  Pulse: 80  95 80  Resp:   20 18  Temp:   98.3 F (36.8 C) 97.7 F (36.5 C)  TempSrc:   Oral Oral  SpO2:   98% 98%  Weight:      Height:        Intake/Output Summary (Last 24 hours) at 10/17/2017 1112 Last data filed at 10/17/2017 0600 Gross per 24 hour  Intake 240 ml  Output 4 ml  Net 236 ml   Filed Weights   10/15/17 0226 10/15/17 1346  Weight: 101.6 kg (224 lb) 101.6 kg (223 lb 15.8 oz)    Exam: General exam: In no acute distress. Respiratory system: Good air movement and clear to auscultation. Cardiovascular system: S1 & S2 heard, irregular HR Gastrointestinal system: Abdomen is nondistended, soft and nontender.  Central nervous system: Alert and oriented. No focal neurological deficits. Extremities: No pedal edema. Skin: No rashes, lesions or ulcers   Data Reviewed:    Labs: Basic Metabolic Panel: Recent Labs  Lab 10/14/17 1743 10/15/17 0809  NA 135 134*  K 4.0 4.1  CL 103 100*  CO2 24 26  GLUCOSE 88 132*  BUN 11 10  CREATININE 0.63 0.68  CALCIUM 9.2 8.9  MG 1.7  --    GFR Estimated  Creatinine Clearance: 115 mL/min (by C-G formula based on SCr of 0.68 mg/dL). Liver Function Tests: Recent Labs  Lab 10/14/17 1743  AST 15  ALT 10*  ALKPHOS 57  BILITOT 0.7  PROT 7.2  ALBUMIN 3.5   No results for input(s): LIPASE, AMYLASE in the last 168 hours. No results for input(s): AMMONIA in the last 168 hours. Coagulation profile Recent Labs  Lab 10/15/17 0041  INR 0.99    CBC: Recent Labs  Lab 10/14/17 1743 10/15/17 0809  WBC 7.8 8.4  NEUTROABS 4.9  --   HGB 10.5* 11.2*  HCT 34.1* 35.3*  MCV 82.4 82.1  PLT 297 329   Cardiac Enzymes: Recent Labs  Lab 10/14/17 1743 10/14/17 2213 10/15/17 0041 10/15/17 0809 10/15/17 1410  TROPONINI <0.03 <0.03 <0.03 <0.03  <0.03   BNP (last 3 results) No results for input(s): PROBNP in the last 8760 hours. CBG: Recent Labs  Lab 10/17/17 0759  GLUCAP 121*   D-Dimer: No results for input(s): DDIMER in the last 72 hours. Hgb A1c: No results for input(s): HGBA1C in the last 72 hours. Lipid Profile: No results for input(s): CHOL, HDL, LDLCALC, TRIG, CHOLHDL, LDLDIRECT in the last 72 hours. Thyroid function studies: Recent Labs    10/14/17 1743  TSH 4.197   Anemia work up: No results for input(s): VITAMINB12, FOLATE, FERRITIN, TIBC, IRON, RETICCTPCT in the last 72 hours. Sepsis Labs: Recent Labs  Lab 10/14/17 1743 10/15/17 0809  WBC 7.8 8.4   Microbiology No results found for this or any previous visit (from the past 240 hour(s)).   Medications:   . benztropine  0.5 mg Oral BID  . diltiazem  30 mg Oral Q6H  . enoxaparin (LOVENOX) injection  100 mg Subcutaneous BID  . FLUoxetine  20 mg Oral Daily  . haloperidol  2 mg Oral QHS  . hydrOXYzine  25 mg Oral BID  . metoprolol tartrate  25 mg Oral BID   Continuous Infusions:     LOS: 2 days   Marinda ElkAbraham Feliz Ortiz  [facility] Pager [phone]  *Please refer to [email or web address], password [email or web address] to get updated schedule on who will round on this patient, as hospitalists switch teams weekly. If 7PM-7AM, please contact night-coverage at [email or web address], password [email or web address] for any overnight needs.  10/17/2017, 11:12 AM

## 2017-10-17 NOTE — Progress Notes (Signed)
Progress Note  Patient Name: Kathy Brown Date of Encounter: 10/17/2017  Primary Cardiologist: New  Subjective   Deneis palpitations  Breathing is OK    Inpatient Medications    Scheduled Meds: . benztropine  0.5 mg Oral BID  . enoxaparin (LOVENOX) injection  100 mg Subcutaneous BID  . FLUoxetine  20 mg Oral Daily  . haloperidol  2 mg Oral QHS  . hydrOXYzine  25 mg Oral BID  . metoprolol tartrate  25 mg Oral BID   Continuous Infusions: . diltiazem (CARDIZEM) infusion Stopped (10/16/17 1529)   PRN Meds: Vital Signs    Vitals:   10/16/17 1521 10/16/17 1541 10/16/17 2221 10/17/17 0513  BP: (!) 88/56 102/62 108/66 109/69  Pulse: 80  95 80  Resp:   20 18  Temp:   98.3 F (36.8 C) 97.7 F (36.5 C)  TempSrc:   Oral Oral  SpO2:   98% 98%  Weight:      Height:        Intake/Output Summary (Last 24 hours) at 10/17/2017 0836 Last data filed at 10/17/2017 0600 Gross per 24 hour  Intake 240 ml  Output 4 ml  Net 236 ml   Filed Weights   10/15/17 0226 10/15/17 1346  Weight: 224 lb (101.6 kg) 223 lb 15.8 oz (101.6 kg)    Telemetry     Atrial fib  80s to 130s  - Personally Reviewed  ECG      Physical Exam   GEN: Obese 36 yo in acute distress.   Neck: No JVD Cardiac: Irreg irreg  , no murmurs, rubs, or gallops.  Respiratory: Clear to auscultation bilaterally. GI: Soft, nontender, non-distended  MS: No edema; No deformity. Neuro:  Nonfocal  Psych: Normal affect   Labs    Chemistry Recent Labs  Lab 10/14/17 1743 10/15/17 0809  NA 135 134*  K 4.0 4.1  CL 103 100*  CO2 24 26  GLUCOSE 88 132*  BUN 11 10  CREATININE 0.63 0.68  CALCIUM 9.2 8.9  PROT 7.2  --   ALBUMIN 3.5  --   AST 15  --   ALT 10*  --   ALKPHOS 57  --   BILITOT 0.7  --   GFRNONAA >60 >60  GFRAA >60 >60  ANIONGAP 8 8     Hematology Recent Labs  Lab 10/14/17 1743 10/15/17 0809  WBC 7.8 8.4  RBC 4.14 4.30  HGB 10.5* 11.2*  HCT 34.1* 35.3*  MCV 82.4 82.1  MCH 25.4*  26.0  MCHC 30.8 31.7  RDW 15.6* 15.8*  PLT 297 329    Cardiac Enzymes Recent Labs  Lab 10/14/17 2213 10/15/17 0041 10/15/17 0809 10/15/17 1410  TROPONINI <0.03 <0.03 <0.03 <0.03   No results for input(s): TROPIPOC in the last 168 hours.   BNPNo results for input(s): BNP, PROBNP in the last 168 hours.   DDimer No results for input(s): DDIMER in the last 168 hours.   Radiology    No results found.  Cardiac Studies   Echo Study Conclusions  - Left ventricle: The cavity size was normal. There was mild   concentric hypertrophy. Systolic function was normal. The   estimated ejection fraction was in the range of 50% to 55%. Wall   motion was normal; there were no regional wall motion   abnormalities. - Mitral valve: Calcified annulus. - Tricuspid valve: There was moderate regurgitation. - Pericardium, extracardiac: A trivial pericardial effusion was   identified posterior to the heart.  Patient Profile     36 y.o. female with shizoaffective d/o found to be manic and with  new dx of afib with RVR    Assessment & Plan    1  Atrial fibrillation  CHADS2VASc 1 Rates not controlled at times   Note dilt IV on hold  BP low eralier. Curr on metoprolol 25 bid   With psych issues I am not sure pt is a good candidate for anticoag. Pt's family is not around to review  Pt would need to be on anticoag for 4 wks at least if plan cardioversion.    For now I would add oral agents.     For questions or updates, please contact [facility] Please consult [email or web address] for contact info under Cardiology/STEMI.      Signed, Dietrich PatesPaula Ellon Marasco, MD  10/17/2017, 8:36 AM

## 2017-10-18 DIAGNOSIS — F250 Schizoaffective disorder, bipolar type: Secondary | ICD-10-CM

## 2017-10-18 DIAGNOSIS — I4891 Unspecified atrial fibrillation: Secondary | ICD-10-CM

## 2017-10-18 DIAGNOSIS — F200 Paranoid schizophrenia: Secondary | ICD-10-CM

## 2017-10-18 LAB — CREATININE, SERUM: Creatinine, Ser: 0.78 mg/dL (ref 0.44–1.00)

## 2017-10-18 LAB — CBC
HCT: 36.6 % (ref 36.0–46.0)
Hemoglobin: 11.6 g/dL — ABNORMAL LOW (ref 12.0–15.0)
MCH: 26.2 pg (ref 26.0–34.0)
MCHC: 31.7 g/dL (ref 30.0–36.0)
MCV: 82.8 fL (ref 78.0–100.0)
PLATELETS: 345 10*3/uL (ref 150–400)
RBC: 4.42 MIL/uL (ref 3.87–5.11)
RDW: 16.1 % — AB (ref 11.5–15.5)
WBC: 7.7 10*3/uL (ref 4.0–10.5)

## 2017-10-18 MED ORDER — DILTIAZEM HCL ER COATED BEADS 120 MG PO CP24: 120.0000 mg | ORAL_CAPSULE | Freq: Every day | ORAL | 0 refills | Status: DC

## 2017-10-18 MED ORDER — METOPROLOL TARTRATE 25 MG PO TABS: 25.0000 mg | ORAL_TABLET | Freq: Two times a day (BID) | ORAL | 0 refills | Status: DC

## 2017-10-18 MED ORDER — ASPIRIN EC 81 MG PO TBEC: 81.0000 mg | DELAYED_RELEASE_TABLET | Freq: Every day | ORAL | 0 refills | Status: DC

## 2017-10-18 NOTE — Progress Notes (Signed)
Progress Note  Patient Name: Kathy SarkLatasha Dunlevy Date of Encounter: 10/18/2017  Primary Cardiologist: New  Subjective   Breathing OK  No palpitations    Inpatient Medications    Scheduled Meds: . benztropine  0.5 mg Oral BID  . diltiazem  30 mg Oral Q6H  . enoxaparin (LOVENOX) injection  100 mg Subcutaneous BID  . FLUoxetine  20 mg Oral Daily  . haloperidol  2 mg Oral QHS  . hydrOXYzine  25 mg Oral BID  . metoprolol tartrate  25 mg Oral BID   Continuous Infusions:  PRN Meds: acetaminophen **OR** acetaminophen, hydrOXYzine, LORazepam, ondansetron **OR** ondansetron (ZOFRAN) IV, traZODone   Vital Signs    Vitals:   10/17/17 1815 10/17/17 2147 10/18/17 0630 10/18/17 1006  BP: (!) 95/47 (!) 112/49 108/66 103/73  Pulse: 91 89 62 74  Resp: 18 18 18    Temp: (!) 97.4 F (36.3 C) 98.3 F (36.8 C) 97.7 F (36.5 C)   TempSrc: Oral Oral Oral   SpO2: 100% 99% 96%   Weight:      Height:        Intake/Output Summary (Last 24 hours) at 10/18/2017 1015 Last data filed at 10/18/2017 0200 Gross per 24 hour  Intake 480 ml  Output -  Net 480 ml   Filed Weights   10/15/17 0226 10/15/17 1346  Weight: 224 lb (101.6 kg) 223 lb 15.8 oz (101.6 kg)    Telemetry    Atrial fib  80s   Personally Reviewed  ECG      Physical Exam   GEN: No acute distress.   Neck: No JVD Cardiac:  Irreg irreg , no murmurs, rubs, or gallops.  Respiratory: Clear to auscultation bilaterally. GI: Soft, nontender, non-distended  MS: No edema; No deformity. Neuro:  Nonfocal  Psych: Normal affect   Labs    Chemistry Recent Labs  Lab 10/14/17 1743 10/15/17 0809 10/18/17 0517  NA 135 134*  --   K 4.0 4.1  --   CL 103 100*  --   CO2 24 26  --   GLUCOSE 88 132*  --   BUN 11 10  --   CREATININE 0.63 0.68 0.78  CALCIUM 9.2 8.9  --   PROT 7.2  --   --   ALBUMIN 3.5  --   --   AST 15  --   --   ALT 10*  --   --   ALKPHOS 57  --   --   BILITOT 0.7  --   --   GFRNONAA >60 >60 >60  GFRAA  >60 >60 >60  ANIONGAP 8 8  --      Hematology Recent Labs  Lab 10/14/17 1743 10/15/17 0809 10/18/17 0517  WBC 7.8 8.4 7.7  RBC 4.14 4.30 4.42  HGB 10.5* 11.2* 11.6*  HCT 34.1* 35.3* 36.6  MCV 82.4 82.1 82.8  MCH 25.4* 26.0 26.2  MCHC 30.8 31.7 31.7  RDW 15.6* 15.8* 16.1*  PLT 297 329 345    Cardiac Enzymes Recent Labs  Lab 10/14/17 2213 10/15/17 0041 10/15/17 0809 10/15/17 1410  TROPONINI <0.03 <0.03 <0.03 <0.03   No results for input(s): TROPIPOC in the last 168 hours.   BNPNo results for input(s): BNP, PROBNP in the last 168 hours.   DDimer No results for input(s): DDIMER in the last 168 hours.   Radiology    No results found.  Cardiac Studies     Patient Profile     36 y.o. female  Assessment & Plan    1  Atrial fibrillation  Persists  Rates are better  Can consolidated to dilt 120 CD and metoprolol 25 bid Pt does not care fore self  Family is not available Presented with mania   I do not think she is a good candidate now for anticoagulation with consideration of cardioversion (that would be optimal given that she is young)  CHADSVASc is 1 (female) Would give ASA (in her if she is ever deemed a candidate would use coumadin to track INR)  Will make sure that she has appt in cardiology Tried to call mother to discuss afib and meds  Not able to leave message    For questions or updates, please contact [facility] Please consult [email or web address] for contact info under Cardiology/STEMI.      Signed, Dietrich PatesPaula Antwoin Lackey, MD  10/18/2017, 10:15 AM

## 2017-10-18 NOTE — Progress Notes (Signed)
ANTICOAGULATION CONSULT NOTE - Follow Up  Pharmacy Consult for enoxaparin Indication: atrial fibrillation  No Known Allergies  Patient Measurements: Height: 5\' 4"  (162.6 cm) Weight: 223 lb 15.8 oz (101.6 kg) IBW/kg (Calculated) : 54.7 Heparin Dosing Weight:   Vital Signs: Temp: 97.7 F (36.5 C) (12/23 0630) Temp Source: Oral (12/23 0630) BP: 103/73 (12/23 1006) Pulse Rate: 74 (12/23 1006)  Labs: Recent Labs    10/15/17 1410 10/18/17 0517  HGB  --  11.6*  HCT  --  36.6  PLT  --  345  CREATININE  --  0.78  TROPONINI <0.03  --     Estimated Creatinine Clearance: 115 mL/min (by C-G formula based on SCr of 0.78 mg/dL).   Medical History: Past Medical History:  Diagnosis Date  . Anxiety   . Asthma   . Chronic back pain   . Schizophrenia (HCC)     Medications:  Scheduled:  . benztropine  0.5 mg Oral BID  . diltiazem  30 mg Oral Q6H  . enoxaparin (LOVENOX) injection  100 mg Subcutaneous BID  . FLUoxetine  20 mg Oral Daily  . haloperidol  2 mg Oral QHS  . hydrOXYzine  25 mg Oral BID  . metoprolol tartrate  25 mg Oral BID    Assessment: 36 yo female with afib and MD wants enoxaparin for anticoagulation. Labs stable. No reported bleeding  Goal of Therapy:  Anti-Xa level 0.6-1 units/ml 4hrs after LMWH dose given Monitor platelets by anticoagulation protocol: Yes   Plan:  Continue enoxaparin 100mg  (1mg /kg) sq q12hr Await discharge plans for anticoag   Hessie KnowsJustin M Clemmie Marxen, PharmD, BCPS Pager [phone] 10/18/2017 11:41 AM

## 2017-10-18 NOTE — Care Management Note (Signed)
Case Management Note  Patient Details  Name: Kathy SarkLatasha Danziger MRN: [id number] Date of Birth: 06-21-1981                    Action/Plan: Discharge Planning: NCM spoke to pt and she lives at home with parents and her 3 children. She can afford medications. No NCM needs identified. CSW referral for transportation.   [name]   Expected Discharge Date:  10/18/17               Expected Discharge Plan:  Home/Self Care  In-House Referral:  NA  Discharge planning Services  CM Consult  Post Acute Care Choice:  NA Choice offered to:  NA  DME Arranged:  N/A DME Agency:  NA  HH Arranged:  NA HH Agency:  NA  Status of Service:  complete  If discussed at Long Length of Stay Meetings, dates discussed:    Additional Comments:  Elliot CousinShavis, Lilith Solana Ellen, RN 10/18/2017, 1:03 PM

## 2017-10-18 NOTE — Progress Notes (Signed)
Patient discharged home, discharge instructions given and explained to patient and she verbalized understanding, denies any pain/distress; skin intact, no wound. Bus pass given to patient for transportation.

## 2017-10-18 NOTE — Discharge Summary (Signed)
Physician Discharge Summary  Kathy Brown [id number] DOB: August 23, 1981 DOA: 10/14/2017  PCP: [place] Ponder[facility]lc, [name]  Admit date: 10/14/2017 Discharge date: 10/18/2017  Admitted From:home Disposition:home  Recommendations for Outpatient Follow-up:  1. Follow up with PCP in 1-2 weeks 2. Please obtain BMP/CBC in one week  Home Health: no Equipment/Devices:none Discharge Condition:stable CODE STATUS:full code Diet recommendation:heart healthy  Brief/Interim Summary:  36 y.o. female past medical history of schizophrenia, chronic back pain [facility] a [facility] episode.  The ED lead EKG showed atrial fibrillation with RVR patient was given metoprolol and heart rate improved.  Patient was admitted for further evaluation.  A. fib with RVR: Heart rate is controlled.  Discussed with cardiologist Dr.Ross.  Patient is not a candidate for anticoagulation currently because of psychiatric condition.  Starting diltiazem and metoprolol.  Heart rate controlled.  Recommended outpatient follow-up.  Schizoaffective disorder/paranoid schizophrenia: Psychiatry recommended resuming home meds and no inpatient psych admission.  Patient is clinically stable.  Mood is stable.  Patient is eager to go home and asking to be released from the hospital.  She denied suicidal or homicidal ideation.  She understand importance of follow-up.    Discharge Diagnoses:  Principal Problem:   Schizoaffective disorder (HCC) Active Problems:   Paranoid schizophrenia (HCC)   Atrial fibrillation with RVR [facility](HCC)    Discharge Instructions  Discharge Instructions    Call MD for:  difficulty breathing, headache or visual disturbances   Complete by:  As directed    Call MD for:  extreme fatigue   Complete by:  As directed    Call MD for:  hives   Complete by:  As directed    Call MD for:  persistant dizziness or light-headedness   Complete by:  As directed     Call MD for:  persistant nausea and vomiting   Complete by:  As directed    Call MD for:  severe uncontrolled pain   Complete by:  As directed    Call MD for:  temperature >100.4   Complete by:  As directed    Diet - low sodium heart healthy   Complete by:  As directed    Increase activity slowly   Complete by:  As directed      Allergies as of 10/18/2017   No Known Allergies     Medication List    TAKE these medications   aspirin EC 81 MG tablet Take 1 tablet (81 mg total) by mouth daily.   benztropine 0.5 MG tablet Commonly known as:  COGENTIN Take 1 tablet (0.5 mg total) by mouth 2 (two) times daily.   diltiazem 120 MG 24 hr capsule Commonly known as:  CARDIZEM CD Take 1 capsule (120 mg total) by mouth daily.   FLUoxetine 20 MG tablet Commonly known as:  PROZAC Take 1 tablet (20 mg total) by mouth daily. What changed:  Another medication with the same name was removed. Continue taking this medication, and follow the directions you see here.   haloperidol 2 MG tablet Commonly known as:  HALDOL Take 1 tablet (2 mg total) by mouth at bedtime. What changed:  Another medication with the same name was removed. Continue taking this medication, and follow the directions you see here.   haloperidol decanoate 100 MG/ML injection Commonly known as:  HALDOL DECANOATE Inject 100 mg into the muscle every 30 (thirty) days.   hydrOXYzine 25 MG tablet Commonly known as:  ATARAX/VISTARIL Take  1 tablet (25 mg total) by mouth 2 (two) times daily as needed for anxiety.   hydrOXYzine 25 MG tablet Commonly known as:  ATARAX/VISTARIL Take 1 tablet (25 mg total) by mouth 2 (two) times daily.   metoprolol tartrate 25 MG tablet Commonly known as:  LOPRESSOR Take 1 tablet (25 mg total) by mouth 2 (two) times daily.   traZODone 50 MG tablet Commonly known as:  DESYREL Take 0.5 tablets (25 mg total) by mouth at bedtime as needed for sleep.      Follow-up Information    [facility]. Schedule an appointment as soon as possible for a visit in 1 week(s).   Contact information: [address] [place] [address] [phone]        Pricilla Riffleoss, Paula V, MD. Schedule an appointment as soon as possible for a visit in 2 week(s).   Specialty:  Cardiology Contact information: [address] [place] [address] [phone]          No Known Allergies  Consultations: Cardiology   Procedures/Studies: echocardiogram  Subjective: Seen and examined at bedside.  Denies headache, dizziness, nausea vomiting chest pain shortness of breath.  Mood is stable.  Discharge Exam: Vitals:   10/18/17 1006 10/18/17 1221  BP: 103/73 112/70  Pulse: 74   Resp:    Temp:    SpO2:     Vitals:   10/17/17 2147 10/18/17 0630 10/18/17 1006 10/18/17 1221  BP: (!) 112/49 108/66 103/73 112/70  Pulse: 89 62 74   Resp: 18 18    Temp: 98.3 F (36.8 C) 97.7 F (36.5 C)    TempSrc: Oral Oral    SpO2: 99% 96%    Weight:      Height:        General: Pt is alert, awake, not in acute distress Cardiovascular: RRR, S1/S2 +, no rubs, no gallops Respiratory: CTA bilaterally, no wheezing, no rhonchi Abdominal: Soft, NT, ND, bowel sounds + Extremities: no edema, no cyanosis    The results of significant diagnostics from this hospitalization (including imaging, microbiology, ancillary and laboratory) are listed below for reference.     Microbiology: No results found for this or any previous visit (from the past 240 hour(s)).   Labs: BNP (last 3 results) No results for input(s): BNP in the last 8760 hours. Basic Metabolic Panel: Recent Labs  Lab 10/14/17 1743 10/15/17 0809 10/18/17 0517  NA 135 134*  --   K 4.0 4.1  --   CL 103 100*  --   CO2 24 26  --   GLUCOSE 88 132*  --   BUN 11 10  --   CREATININE 0.63 0.68 0.78  CALCIUM 9.2 8.9  --   MG 1.7  --   --    Liver Function Tests: Recent Labs  Lab 10/14/17 1743  AST 15  ALT  10*  ALKPHOS 57  BILITOT 0.7  PROT 7.2  ALBUMIN 3.5   No results for input(s): LIPASE, AMYLASE in the last 168 hours. No results for input(s): AMMONIA in the last 168 hours. CBC: Recent Labs  Lab 10/14/17 1743 10/15/17 0809 10/18/17 0517  WBC 7.8 8.4 7.7  NEUTROABS 4.9  --   --   HGB 10.5* 11.2* 11.6*  HCT 34.1* 35.3* 36.6  MCV 82.4 82.1 82.8  PLT 297 329 345   Cardiac Enzymes: Recent Labs  Lab 10/14/17 1743 10/14/17 2213 10/15/17 0041 10/15/17 0809 10/15/17 1410  TROPONINI <0.03 <  0.03 <0.03 <0.03 <0.03   BNP: Invalid input(s): POCBNP CBG: Recent Labs  Lab 10/17/17 0759  GLUCAP 121*   D-Dimer No results for input(s): DDIMER in the last 72 hours. Hgb A1c No results for input(s): HGBA1C in the last 72 hours. Lipid Profile No results for input(s): CHOL, HDL, LDLCALC, TRIG, CHOLHDL, LDLDIRECT in the last 72 hours. Thyroid function studies No results for input(s): TSH, T4TOTAL, T3FREE, THYROIDAB in the last 72 hours.  Invalid input(s): FREET3 Anemia work up No results for input(s): VITAMINB12, FOLATE, FERRITIN, TIBC, IRON, RETICCTPCT in the last 72 hours. Urinalysis    Component Value Date/Time   COLORURINE YELLOW 01/09/2016 0952   APPEARANCEUR CLEAR 01/09/2016 0952   LABSPEC 1.003 (L) 01/09/2016 0952   PHURINE 6.5 01/09/2016 0952   GLUCOSEU NEGATIVE 01/09/2016 0952   HGBUR NEGATIVE 01/09/2016 0952   BILIRUBINUR NEGATIVE 01/09/2016 0952   KETONESUR NEGATIVE 01/09/2016 0952   PROTEINUR NEGATIVE 01/09/2016 0952   UROBILINOGEN 0.2 09/20/2014 1135   NITRITE NEGATIVE 01/09/2016 0952   LEUKOCYTESUR NEGATIVE 01/09/2016 0952   Sepsis Labs Invalid input(s): PROCALCITONIN,  WBC,  LACTICIDVEN Microbiology No results found for this or any previous visit (from the past 240 hour(s)).   Time coordinating discharge: 31 minutes  SIGNED:   Maxie Barb, MD  [facility] 10/18/2017, 12:57 PM  If 7PM-7AM, please contact  night-coverage [email or web address] Password [email or web address]

## 2017-10-18 NOTE — Care Management Important Message (Signed)
Important Message  Patient Details  Name: Kathy SarkLatasha Hetland MRN: [id number] Date of Birth: August 19, 1981   Medicare Important Message Given:  Yes    Elliot CousinShavis, Heela Heishman Ellen, RN 10/18/2017, 12:58 PM

## 2017-10-19 ENCOUNTER — Telehealth: Payer: Self-pay | Admitting: Internal Medicine

## 2017-11-03 DIAGNOSIS — M79604 Pain in right leg: Secondary | ICD-10-CM | POA: Diagnosis not present

## 2017-11-03 DIAGNOSIS — E785 Hyperlipidemia, unspecified: Secondary | ICD-10-CM | POA: Diagnosis not present

## 2017-11-03 DIAGNOSIS — Z13228 Encounter for screening for other metabolic disorders: Secondary | ICD-10-CM | POA: Diagnosis not present

## 2017-11-03 DIAGNOSIS — Z23 Encounter for immunization: Secondary | ICD-10-CM | POA: Diagnosis not present

## 2017-11-03 DIAGNOSIS — E119 Type 2 diabetes mellitus without complications: Secondary | ICD-10-CM | POA: Diagnosis not present

## 2017-11-03 DIAGNOSIS — B370 Candidal stomatitis: Secondary | ICD-10-CM | POA: Diagnosis not present

## 2017-11-03 DIAGNOSIS — Z01419 Encounter for gynecological examination (general) (routine) without abnormal findings: Secondary | ICD-10-CM | POA: Diagnosis not present

## 2017-11-05 ENCOUNTER — Encounter (HOSPITAL_COMMUNITY): Payer: Self-pay | Admitting: Emergency Medicine

## 2017-11-05 ENCOUNTER — Emergency Department (HOSPITAL_COMMUNITY)
Admission: EM | Admit: 2017-11-05 | Discharge: 2017-11-05 | Disposition: A | Discharge status: Home / Self Care | Payer: Medicare Other | Attending: Emergency Medicine | Admitting: Emergency Medicine

## 2017-11-05 DIAGNOSIS — J45909 Unspecified asthma, uncomplicated: Secondary | ICD-10-CM | POA: Insufficient documentation

## 2017-11-05 DIAGNOSIS — Z79899 Other long term (current) drug therapy: Secondary | ICD-10-CM | POA: Insufficient documentation

## 2017-11-05 DIAGNOSIS — F259 Schizoaffective disorder, unspecified: Secondary | ICD-10-CM | POA: Insufficient documentation

## 2017-11-05 DIAGNOSIS — M79673 Pain in unspecified foot: Secondary | ICD-10-CM | POA: Diagnosis not present

## 2017-11-05 DIAGNOSIS — Y939 Activity, unspecified: Secondary | ICD-10-CM | POA: Insufficient documentation

## 2017-11-05 DIAGNOSIS — Y929 Unspecified place or not applicable: Secondary | ICD-10-CM | POA: Diagnosis not present

## 2017-11-05 DIAGNOSIS — S99921A Unspecified injury of right foot, initial encounter: Secondary | ICD-10-CM | POA: Diagnosis present

## 2017-11-05 DIAGNOSIS — Z7982 Long term (current) use of aspirin: Secondary | ICD-10-CM | POA: Insufficient documentation

## 2017-11-05 DIAGNOSIS — Y999 Unspecified external cause status: Secondary | ICD-10-CM | POA: Insufficient documentation

## 2017-11-05 DIAGNOSIS — Y33XXXA Other specified events, undetermined intent, initial encounter: Secondary | ICD-10-CM | POA: Insufficient documentation

## 2017-11-05 DIAGNOSIS — S90821A Blister (nonthermal), right foot, initial encounter: Secondary | ICD-10-CM | POA: Insufficient documentation

## 2017-11-05 DIAGNOSIS — R030 Elevated blood-pressure reading, without diagnosis of hypertension: Secondary | ICD-10-CM | POA: Diagnosis not present

## 2017-11-05 MED ORDER — ACETAMINOPHEN 325 MG PO TABS: 650.0000 mg | ORAL_TABLET | Freq: Four times a day (QID) | ORAL | 0 refills | Status: DC | PRN

## 2017-11-05 MED ORDER — ACETAMINOPHEN 325 MG PO TABS
650.0000 mg | ORAL_TABLET | Freq: Once | ORAL | Status: AC
  Administered 2017-11-05: 650 mg via ORAL
  Filled 2017-11-05: qty 2

## 2017-11-05 NOTE — ED Provider Notes (Signed)
[facility] Provider Note   CSN: [id number] Arrival date & time: 11/05/17  1517     History   Chief Complaint Chief Complaint  Patient presents with  . Foot Pain  . Medical Clearance    HPI Janean SarkLatasha Nadeem is a 37 y.o. female.  HPI   Patient is a 37 year old female with a history of paranoid schizophrenia, schizoaffective disorder, and atrial fibrillation presenting for a blister of her right foot.  Patient presents per EMS.  Patient reports that she noted her pain to her right heel over the past 1-2 days.  No erythema or drainage from the area.  Patient reports that she called EMS because her foot was hurting.  Patient is a poor historian.  Level 5 caveat psychiatric disorder.  Collateral information obtained from [name], patient's mother.  Patient's mother reports that patient does not have a legal guardian at this time and she would like more information on how to become the patient's legal guardian.  Patient's mother reports that patient returned early from her adult day program, and called EMS when she was at home.  Patient's mother reports that the patient does this anytime no one is home when she arrives home.  Past Medical History:  Diagnosis Date  . Anxiety   . Asthma   . Chronic back pain   . Schizophrenia [facility](HCC)     Patient Active Problem List   Diagnosis Date Noted  . Atrial fibrillation with RVR (HCC) 10/15/2017  . Auditory hallucinations   . Paranoid schizophrenia (HCC)   . Schizoaffective disorder (HCC) 03/25/2014  . Psychosis (HCC) 03/25/2014    No past surgical history on file.  OB History    No data available       Home Medications    Prior to Admission medications   Medication Sig Start Date End Date Taking? Authorizing Provider  aspirin EC 81 MG tablet Take 1 tablet (81 mg total) by mouth daily. 10/18/17   Maxie BarbBhandari, Dron Prasad, MD  benztropine (COGENTIN) 0.5 MG tablet Take 1 tablet (0.5 mg  total) by mouth 2 (two) times daily. 08/25/16   Hedges, Tinnie GensJeffrey, PA-C  diltiazem (CARDIZEM CD) 120 MG 24 hr capsule Take 1 capsule (120 mg total) by mouth daily. 10/18/17 10/18/18  Maxie BarbBhandari, Dron Prasad, MD  FLUoxetine (PROZAC) 20 MG tablet Take 1 tablet (20 mg total) by mouth daily. 12/15/16   Kirichenko, Tatyana, PA-C  haloperidol (HALDOL) 2 MG tablet Take 1 tablet (2 mg total) by mouth at bedtime. 11/25/16   Garlon HatchetSanders, Lisa M, PA-C  haloperidol decanoate (HALDOL DECANOATE) 100 MG/ML injection Inject 100 mg into the muscle every 30 (thirty) days. 05/08/14   [provider]  hydrOXYzine (ATARAX/VISTARIL) 25 MG tablet Take 1 tablet (25 mg total) by mouth 2 (two) times daily as needed for anxiety. 11/25/16   Garlon HatchetSanders, Lisa M, PA-C  hydrOXYzine (ATARAX/VISTARIL) 25 MG tablet Take 1 tablet (25 mg total) by mouth 2 (two) times daily. 12/15/16   Kirichenko, Tatyana, PA-C  metoprolol tartrate (LOPRESSOR) 25 MG tablet Take 1 tablet (25 mg total) by mouth 2 (two) times daily. 10/18/17   Maxie BarbBhandari, Dron Prasad, MD  traZODone (DESYREL) 50 MG tablet Take 0.5 tablets (25 mg total) by mouth at bedtime as needed for sleep. 04/25/15   Toy Cookeyocherty, Megan, MD    Family History Family History  Problem Relation Age of Onset  . Hypertension Mother   . Diabetes Mother     Social History Social History  Tobacco Use  . Smoking status: Never Smoker  . Smokeless tobacco: Never Used  Substance Use Topics  . Alcohol use: No  . Drug use: No     Allergies   Patient has no known allergies.   Review of Systems Review of Systems  Constitutional: Negative for chills.  Musculoskeletal: Negative for joint swelling.  Skin: Positive for color change. Negative for rash.  Neurological: Negative for weakness and numbness.     Physical Exam Updated Vital Signs BP (!) 136/102   Pulse 68   Temp 98.2 F (36.8 C)   Resp 18   LMP  (LMP Unknown)   SpO2 100%   Physical Exam  Constitutional: She appears  well-developed and well-nourished. No distress.  Sitting comfortably in bed.  HENT:  Head: Normocephalic and atraumatic.  Eyes: Conjunctivae are normal. Right eye exhibits no discharge. Left eye exhibits no discharge.  EOMs normal to gross examination.  Neck: Normal range of motion.  Cardiovascular: Normal rate.  Intact, 2+ radial pulse.  Patient is in atrial fibrillation, rate controlled at this time.  Pulmonary/Chest: Effort normal and breath sounds normal.  Normal respiratory effort. Patient converses comfortably. No audible wheeze or stridor.  Abdominal: She exhibits no distension.  Musculoskeletal: Normal range of motion.  Neurological: She is alert.  Cranial nerves intact to gross observation. Patient moves extremities without difficulty.  Skin: Skin is warm and dry. She is not diaphoretic.  There is a large fluid-filled pocket on the right heel.  There is no surrounding erythema.  Psychiatric: She has a normal mood and affect. Her behavior is normal. Judgment and thought content normal.  Nursing note and vitals reviewed.    ED Treatments / Results  Labs (all labs ordered are listed, but only abnormal results are displayed) Labs Reviewed - No data to display  EKG  EKG Interpretation None       Radiology No results found.  Procedures .Marland KitchenIncision and Drainage Date/Time: 11/05/2017 6:39 PM Performed by: Elisha Ponder, PA-C Authorized by: Elisha Ponder, PA-C   Consent:    Consent obtained:  Verbal   Consent given by:  Patient   Risks discussed:  Incomplete drainage   Alternatives discussed:  No treatment Location:    Indications for incision and drainage: Blister of foot.   Location: right foot. Pre-procedure details:    Skin preparation:  Chloraprep Anesthesia (see MAR for exact dosages):    Anesthesia method:  None Procedure type:    Complexity:  Simple Procedure details:    Incision types:  Stab incision Micah Noel with 18-gauge needle.)   Drainage  amount:  Moderate Post-procedure details:    Patient tolerance of procedure:  Tolerated well, no immediate complications Comments:     Roof of the blister was then dressed with Xeroform wet-to-dry dressing.   (including critical care time)  Medications Ordered in ED Medications  acetaminophen (TYLENOL) tablet 650 mg (650 mg Oral Given 11/05/17 1830)     Initial Impression / Assessment and Plan / ED Course  I have reviewed the triage vital signs and the nursing notes.  Pertinent labs & imaging results that were available during my care of the patient were reviewed by me and considered in my medical decision making (see chart for details).     Final Clinical Impressions(s) / ED Diagnoses   Final diagnoses:  Blister of right foot, initial encounter   Patient is nontoxic-appearing, afebrile, no acute distress.  Patient is in atrial fibrillation on my examination, however per  the discharge summary this is chronic for patient and she is rate controlled.  Patient is not a candidate for anticoagulation.    I spoke with the patient's mother with whom the patient lives prior to treatment to determine who is patient's legal guardian and medical decision maker.  Patient's mother reports that patient does not have a legal guardian at this time and is her own agent.  Patient's mother does wish to become the patient's legal guardian.  I discussed the course of care with both the patient and her mother.  I involve case management to discuss with the mother about the legal guardianship process, as well as obtaining a taxi voucher for the patient to go home.  I do not feel that patient is safe to ride the bus by herself.  Blister was lanced today with an 18-gauge needle and serous fluid expressed.  The roof of the blister was then dressed with Xeroform wet-to-dry dressing.  I discussed with both the patient and the patient's mother over the phone the need to wash the site daily with soap and water, and  change the dressing with this materials we provided today.  Patient and her mother are in understanding and agree with the plan of care.  ED Discharge Orders    None       Delia Chimes 11/05/17 1919    Donnetta Hutching, MD 11/06/17 1539

## 2017-11-05 NOTE — ED Triage Notes (Addendum)
Per EMS- pt called EMS because she was left alone and could not find her mother/father, states she is not supposed to be left alone. Pt has recent noted admit for schizoaffective disorder. Pt mother arrived on scene while EMS was there and appeared agitated that EMS was there. Mother states "she calls 911 everytime we leave." Pt states she is here for evaluation of blister to right foot. PT states she needs a tylenol prescription. Pt crying about the blister at triage. Mother is not legal guardian but will have to be the one to pick her up. [phone] Tammy or she will need a bus pass *per patient.  Foot evaluated, has a visible blister.

## 2017-11-05 NOTE — Discharge Instructions (Signed)
Please see the information and instructions below regarding your visit.  Your diagnoses today include:  1. Blister of right foot, initial encounter    Blisters form and there is friction against the skin that causes a fluid collection.  Sometimes the infection starts to spread to surrounding tissue, causing redness and swelling. We call this cellulitis.  You need to look for signs of this.  The signs of the redness, swelling, or drainage that is green or yellow from the blister.  Tests performed today include: See side panel of your discharge paperwork for testing performed today. Vital signs are listed at the bottom of these instructions.   Medications prescribed:    Take any prescribed medications only as prescribed, and any over the counter medications only as directed on the packaging.  You may take Tylenol every 6 hours as needed for pain.  You may take 650 mg, which are 2 of the 325 mg tablets.  Home care instructions:  Please follow any educational materials contained in this packet.   Some things that may promote healing of your wound and infection include:  Raise leg up above the level of your heart 3 or 4 times a day, for 30 minutes each time. Keep the infected area clean and dry. You can take a shower or bath, but be sure to pat the area dry with a towel afterward. Do not put any antibiotic ointments or creams on the area. Reapply a dry gauze dressing any time the bandage has become soaked with drainage, or after cleansing the wound.  Apply warm compresses to the wound 3-4 times daily to encourage drainage.   Return instructions:  Please return to the Emergency Department if you experience worsening symptoms. You should return for reevaluation of your infection if you notice spreading redness, increased swelling, an abscess develops, or you develop signs and symptoms of a systemic illness such as fever and chills.  Please return if you have any other emergent  concerns.  Additional Information:   Your vital signs today were: BP (!) 138/94 (BP Location: Right Arm)    Pulse 82    Temp 98.2 F (36.8 C)    Resp 18    LMP  (LMP Unknown)    SpO2 98%  If your blood pressure (BP) was elevated on multiple readings during this visit above 130 for the top number or above 80 for the bottom number, please have this repeated by your primary care provider within one month. --------------  Thank you for allowing us to participate in your care today.

## 2017-11-05 NOTE — Progress Notes (Signed)
CSW spoke with pt's mom. Explained the process of obtaining legal guardianship. Pt's mom gave permission to [facility]. Pt's mom is unable to pick up the pt because she does not have a car.   CSW waited with pt for the taxi to come.   Montine CircleKelsy Leonell Lobdell, Silverio LayLCSWA [facility]  [phone]

## 2017-11-05 NOTE — ED Notes (Signed)
Declined W/C at D/C and was escorted to lobby by RN. 

## 2017-11-05 NOTE — Clinical Social Work Note (Signed)
Clinical Social Work Assessment  Patient Details  Name: Kathy Brown MRN: [id number] Date of Birth: Nov 07, 1980  Date of referral:  11/05/17               Reason for consult:  Other (Comment Required)(Legal Guardianship)                Permission sought to share information with:  Family Supports Permission granted to share information::  Yes, Verbal Permission Granted  Name::        Agency::     Relationship::     Contact Information:     Housing/Transportation Living arrangements for the past 2 months:  Single Family Home Source of Information:  Parent, Patient Patient Interpreter Needed:  None Criminal Activity/Legal Involvement Pertinent to Current Situation/Hospitalization:    Significant Relationships:  Dependent Children, Parents Lives with:  Parents, Minor Children Do you feel safe going back to the place where you live?  Yes Need for family participation in patient care:  Yes (Comment)  Care giving concerns:  Concerns about legal guardianship. Care giving concerns about obtaining legal guardianship.    Social Worker assessment / plan:  Pt consulted for legal guardianship and transportation. CSW spoke with pt via bedside and pt's mother via phone. Pt's mother asked questions about obtaining legal guardianship. CSW explained the process. CSW obtained permission from pt and pt's mother to send pt home via taxi. Pt's mother does not have the means to come pick up the pt from the ED. CSW waited with the pt for the taxi to come.   Employment status:  Disabled (Comment on whether or not currently receiving Disability) Insurance information:  Medicare PT Recommendations:  Not assessed at this time Information / Referral to community resources:  Other (Comment Required)(Department of Social Services)  Patient/Family's Response to care:  Pt agreeable to plan of care.   Patient/Family's Understanding of and Emotional Response to Diagnosis, Current Treatment, and Prognosis:  Pt  did not express any questions or concerns to the CSW at this time.   Emotional Assessment Appearance:  Appears stated age Attitude/Demeanor/Rapport:    Affect (typically observed):  Accepting, Appropriate, Calm Orientation:  Oriented to Self, Oriented to Situation, Oriented to Place, Oriented to  Time Alcohol / Substance use:    Psych involvement (Current and /or in the community):  No (Comment)  Discharge Needs  Concerns to be addressed:  Decision making concerns, Cognitive Concerns Readmission within the last 30 days:  Yes Current discharge risk:  None Barriers to Discharge:  No Barriers Identified   Montine CircleKelsy Catlyn Shipton, LCSW 11/05/2017, 7:11 PM

## 2017-11-05 NOTE — Care Management (Signed)
CM consult reviewed. CSW consult entered for information related to guardianship. Rubie Maidrystal Nasir Bright RN CCM

## 2017-11-06 NOTE — Telephone Encounter (Signed)
close

## 2017-11-10 DIAGNOSIS — F331 Major depressive disorder, recurrent, moderate: Secondary | ICD-10-CM | POA: Diagnosis not present

## 2017-11-10 DIAGNOSIS — F250 Schizoaffective disorder, bipolar type: Secondary | ICD-10-CM | POA: Diagnosis not present

## 2017-11-10 DIAGNOSIS — F849 Pervasive developmental disorder, unspecified: Secondary | ICD-10-CM | POA: Diagnosis not present

## 2017-12-08 DIAGNOSIS — F331 Major depressive disorder, recurrent, moderate: Secondary | ICD-10-CM | POA: Diagnosis not present

## 2017-12-08 DIAGNOSIS — F849 Pervasive developmental disorder, unspecified: Secondary | ICD-10-CM | POA: Diagnosis not present

## 2017-12-08 DIAGNOSIS — F250 Schizoaffective disorder, bipolar type: Secondary | ICD-10-CM | POA: Diagnosis not present

## 2018-01-02 DIAGNOSIS — I361 Nonrheumatic tricuspid (valve) insufficiency: Secondary | ICD-10-CM | POA: Diagnosis not present

## 2018-01-02 DIAGNOSIS — I481 Persistent atrial fibrillation: Secondary | ICD-10-CM | POA: Diagnosis not present

## 2018-01-05 DIAGNOSIS — F849 Pervasive developmental disorder, unspecified: Secondary | ICD-10-CM | POA: Diagnosis not present

## 2018-01-05 DIAGNOSIS — F331 Major depressive disorder, recurrent, moderate: Secondary | ICD-10-CM | POA: Diagnosis not present

## 2018-01-05 DIAGNOSIS — F250 Schizoaffective disorder, bipolar type: Secondary | ICD-10-CM | POA: Diagnosis not present

## 2018-02-02 DIAGNOSIS — F250 Schizoaffective disorder, bipolar type: Secondary | ICD-10-CM | POA: Diagnosis not present

## 2018-02-02 DIAGNOSIS — F849 Pervasive developmental disorder, unspecified: Secondary | ICD-10-CM | POA: Diagnosis not present

## 2018-02-02 DIAGNOSIS — F331 Major depressive disorder, recurrent, moderate: Secondary | ICD-10-CM | POA: Diagnosis not present

## 2018-03-02 ENCOUNTER — Emergency Department (HOSPITAL_COMMUNITY)
Admission: EM | Admit: 2018-03-02 | Discharge: 2018-03-02 | Disposition: A | Discharge status: Home / Self Care | Payer: Medicare Other | Attending: Emergency Medicine | Admitting: Emergency Medicine

## 2018-03-02 ENCOUNTER — Encounter (HOSPITAL_COMMUNITY): Payer: Self-pay | Admitting: *Deleted

## 2018-03-02 ENCOUNTER — Other Ambulatory Visit: Payer: Self-pay

## 2018-03-02 DIAGNOSIS — F331 Major depressive disorder, recurrent, moderate: Secondary | ICD-10-CM | POA: Diagnosis not present

## 2018-03-02 DIAGNOSIS — F29 Unspecified psychosis not due to a substance or known physiological condition: Secondary | ICD-10-CM | POA: Diagnosis not present

## 2018-03-02 DIAGNOSIS — F419 Anxiety disorder, unspecified: Secondary | ICD-10-CM | POA: Diagnosis not present

## 2018-03-02 DIAGNOSIS — F250 Schizoaffective disorder, bipolar type: Secondary | ICD-10-CM | POA: Diagnosis not present

## 2018-03-02 DIAGNOSIS — J45909 Unspecified asthma, uncomplicated: Secondary | ICD-10-CM | POA: Insufficient documentation

## 2018-03-02 DIAGNOSIS — F209 Schizophrenia, unspecified: Secondary | ICD-10-CM | POA: Diagnosis not present

## 2018-03-02 DIAGNOSIS — F849 Pervasive developmental disorder, unspecified: Secondary | ICD-10-CM | POA: Diagnosis not present

## 2018-03-02 DIAGNOSIS — Z760 Encounter for issue of repeat prescription: Secondary | ICD-10-CM | POA: Insufficient documentation

## 2018-03-02 DIAGNOSIS — I4891 Unspecified atrial fibrillation: Secondary | ICD-10-CM | POA: Diagnosis not present

## 2018-03-02 LAB — CBC
HEMATOCRIT: 36.8 % (ref 36.0–46.0)
HEMOGLOBIN: 11.6 g/dL — AB (ref 12.0–15.0)
MCH: 25.7 pg — ABNORMAL LOW (ref 26.0–34.0)
MCHC: 31.5 g/dL (ref 30.0–36.0)
MCV: 81.6 fL (ref 78.0–100.0)
Platelets: 331 10*3/uL (ref 150–400)
RBC: 4.51 MIL/uL (ref 3.87–5.11)
RDW: 15.8 % — AB (ref 11.5–15.5)
WBC: 8.6 10*3/uL (ref 4.0–10.5)

## 2018-03-02 LAB — COMPREHENSIVE METABOLIC PANEL
ALBUMIN: 3.9 g/dL (ref 3.5–5.0)
ALT: 19 U/L (ref 14–54)
ANION GAP: 10 (ref 5–15)
AST: 20 U/L (ref 15–41)
Alkaline Phosphatase: 57 U/L (ref 38–126)
BUN: 8 mg/dL (ref 6–20)
CO2: 25 mmol/L (ref 22–32)
Calcium: 9.4 mg/dL (ref 8.9–10.3)
Chloride: 104 mmol/L (ref 101–111)
Creatinine, Ser: 0.91 mg/dL (ref 0.44–1.00)
GFR calc Af Amer: >60 mL/min (ref >60)
GFR calc non Af Amer: >60 mL/min (ref >60)
GLUCOSE: 115 mg/dL — AB (ref 65–99)
POTASSIUM: 3.8 mmol/L (ref 3.5–5.1)
Sodium: 139 mmol/L (ref 135–145)
TOTAL PROTEIN: 8.2 g/dL — AB (ref 6.5–8.1)
Total Bilirubin: 0.4 mg/dL (ref 0.3–1.2)

## 2018-03-02 LAB — ACETAMINOPHEN LEVEL

## 2018-03-02 LAB — ETHANOL: Alcohol, Ethyl (B): <10 mg/dL (ref <10)

## 2018-03-02 LAB — RAPID URINE DRUG SCREEN, HOSP PERFORMED
Amphetamines: NOT DETECTED
BARBITURATES: NOT DETECTED
BENZODIAZEPINES: NOT DETECTED
COCAINE: NOT DETECTED
Opiates: NOT DETECTED
Tetrahydrocannabinol: NOT DETECTED

## 2018-03-02 LAB — I-STAT BETA HCG BLOOD, ED (MC, WL, AP ONLY): I-stat hCG, quantitative: <5 m[IU]/mL (ref <5)

## 2018-03-02 LAB — SALICYLATE LEVEL: Salicylate Lvl: <7 mg/dL (ref 2.8–30.0)
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
## 2018-03-02 NOTE — ED Notes (Signed)
Pt given discharge instructions along with referral information for [facility] by TTS and bus pass for transport back home.

## 2018-03-02 NOTE — Discharge Instructions (Signed)
Call your primary care physician to help with affording medications. They will likely have resources to help get your medications refilled. Return to the emergency department if any concerning signs or symptoms develop.

## 2018-03-02 NOTE — ED Notes (Signed)
TTS speaking with pt.  

## 2018-03-02 NOTE — ED Provider Notes (Signed)
[facility]-EMERGENCY DEPT Provider Note   CSN: [id number] Arrival date & time: 03/02/18  1811     History   Chief Complaint Chief Complaint  Patient presents with  . Medical Clearance    HPI Kathy Brown is a 37 y.o. female with history of A. fib, anxiety, asthma, schizophrenia presents requesting medication assistance.  She states that earlier today she was involved in a verbal altercation with her mother about getting off of her medications.  She has been out of her home medicines for 1.5 weeks.  She is currently waiting for a new Medicare card.  She denies suicidal ideation or homicidal ideation.  She does endorse auditory hallucinations but will not elaborate as to the quality of her hallucinations.  She states that she feels anxious and that her mood is unstable when she does not have her medications.  She denies any medical complaints at this time.  The history is provided by the patient.    Past Medical History:  Diagnosis Date  . Anxiety   . Asthma   . Chronic back pain   . Schizophrenia [facility])     Patient Active Problem List   Diagnosis Date Noted  . Atrial fibrillation with RVR (HCC) 10/15/2017  . Auditory hallucinations   . Paranoid schizophrenia (HCC)   . Schizoaffective disorder (HCC) 03/25/2014  . Psychosis (HCC) 03/25/2014    History reviewed. No pertinent surgical history.   OB History   None      Home Medications    Prior to Admission medications   Medication Sig Start Date End Date Taking? Authorizing Provider  acetaminophen (TYLENOL) 325 MG tablet Take 2 tablets (650 mg total) by mouth every 6 (six) hours as needed. Patient not taking: Reported on 03/02/2018 11/05/17   Elisha Ponder, PA-C  aspirin EC 81 MG tablet Take 1 tablet (81 mg total) by mouth daily. Patient not taking: Reported on 03/02/2018 10/18/17   Maxie Barb, MD  benztropine (COGENTIN) 0.5 MG tablet Take 1 tablet (0.5 mg total) by mouth 2 (two) times  daily. Patient not taking: Reported on 03/02/2018 08/25/16   Hedges, Tinnie Gens, PA-C  diltiazem (CARDIZEM CD) 120 MG 24 hr capsule Take 1 capsule (120 mg total) by mouth daily. Patient not taking: Reported on 03/02/2018 10/18/17 10/18/18  Maxie Barb, MD  FLUoxetine (PROZAC) 20 MG tablet Take 1 tablet (20 mg total) by mouth daily. Patient not taking: Reported on 03/02/2018 12/15/16   Jaynie Crumble, PA-C  haloperidol (HALDOL) 2 MG tablet Take 1 tablet (2 mg total) by mouth at bedtime. Patient not taking: Reported on 03/02/2018 11/25/16   Garlon Hatchet, PA-C  hydrOXYzine (ATARAX/VISTARIL) 25 MG tablet Take 1 tablet (25 mg total) by mouth 2 (two) times daily as needed for anxiety. Patient not taking: Reported on 03/02/2018 11/25/16   Garlon Hatchet, PA-C  hydrOXYzine (ATARAX/VISTARIL) 25 MG tablet Take 1 tablet (25 mg total) by mouth 2 (two) times daily. Patient not taking: Reported on 03/02/2018 12/15/16   Jaynie Crumble, PA-C  metoprolol tartrate (LOPRESSOR) 25 MG tablet Take 1 tablet (25 mg total) by mouth 2 (two) times daily. Patient not taking: Reported on 03/02/2018 10/18/17   Maxie Barb, MD  traZODone (DESYREL) 50 MG tablet Take 0.5 tablets (25 mg total) by mouth at bedtime as needed for sleep. Patient not taking: Reported on 03/02/2018 04/25/15   Toy Cookey, MD    Family History Family History  Problem Relation Age of Onset  .  Hypertension Mother   . Diabetes Mother     Social History Social History   Tobacco Use  . Smoking status: Never Smoker  . Smokeless tobacco: Never Used  Substance Use Topics  . Alcohol use: No  . Drug use: No     Allergies   Patient has no known allergies.   Review of Systems Review of Systems  Constitutional: Negative for chills and fever.  Respiratory: Negative for shortness of breath.   Cardiovascular: Negative for chest pain and leg swelling.  Gastrointestinal: Negative for abdominal pain, nausea and vomiting.    Psychiatric/Behavioral: Positive for agitation and dysphoric mood. The patient is nervous/anxious.   All other systems reviewed and are negative.    Physical Exam Updated Vital Signs BP (!) 121/92 (BP Location: Right Arm)   Pulse 93   Temp 98.6 F (37 C) (Oral)   Resp 18   SpO2 98%   Physical Exam  Constitutional: She appears well-developed and well-nourished. No distress.  HENT:  Head: Normocephalic and atraumatic.  Eyes: Conjunctivae are normal. Right eye exhibits no discharge. Left eye exhibits no discharge.  Neck: Normal range of motion. Neck supple. No JVD present. No tracheal deviation present.  Cardiovascular:  Irregularly irregular rhythm, 2+ radial and DP/PT pulses bl, no LE edema   Pulmonary/Chest: Effort normal and breath sounds normal. No stridor. No respiratory distress. She has no wheezes. She has no rales. She exhibits no tenderness.  Abdominal: Soft. Bowel sounds are normal. She exhibits no distension. There is no tenderness. There is no guarding.  Musculoskeletal: She exhibits no edema.  Neurological: She is alert.  Fluent speech, with no evidence of dysarthria or aphasia, no facial droop.  Alert and oriented to person and place but not time.  Skin: Skin is warm and dry. No erythema.  Psychiatric: Her speech is normal. Her affect is labile. She expresses no homicidal and no suicidal ideation. She expresses no suicidal plans and no homicidal plans.  Emotionally labile, intermittently tearful specifically when talking about her mother.  Does not appear to be responding to internal stimuli at this time.  Nursing note and vitals reviewed.    ED Treatments / Results  Labs (all labs ordered are listed, but only abnormal results are displayed) Labs Reviewed  COMPREHENSIVE METABOLIC PANEL - Abnormal; Notable for the following components:      Result Value   Glucose, Bld 115 (*)    Total Protein 8.2 (*)    All other components within normal limits  ACETAMINOPHEN  LEVEL - Abnormal; Notable for the following components:   Acetaminophen (Tylenol), Serum <10 (*)    All other components within normal limits  CBC - Abnormal; Notable for the following components:   Hemoglobin 11.6 (*)    MCH 25.7 (*)    RDW 15.8 (*)    All other components within normal limits  ETHANOL  SALICYLATE LEVEL  RAPID URINE DRUG SCREEN, HOSP PERFORMED  I-STAT BETA HCG BLOOD, ED ([facility], [facility], [facility] ONLY)    EKG None  Radiology No results found.  Procedures Procedures (including critical care time)  Medications Ordered in ED Medications - No data to display   Initial Impression / Assessment and Plan / ED Course  I have reviewed the triage vital signs and the nursing notes.  Pertinent labs & imaging results that were available during my care of the patient were reviewed by me and considered in my medical decision making (see chart for details).     Patient presents  requesting assistance with affording her medications.  She apparently was involved in an altercation with her mother regarding this earlier and states that she feels that her mood is unstable without her medications.  She has been off of them for 1.5 weeks.  She is afebrile, vital signs are stable in the ED.  She has an irregularly irregular rhythm but is known to have persistent A. fib for which she takes aspirin.  Normal rate.  We will obtain psychiatric evaluation for further recommendations.  Remainder of lab work and physical examination is reassuring. EKG does show a-fib with heart rate detected at 133 BPM but on reassessment when the patient is calmer her HR is around 90BPM.   9:45PM Spoke with Vikki Ports with TTS who states that patient does not meet inpatient criteria for admission.They state that she is stable for discharge home and they will provide resources for her medications.  At this time I do not believe the patient to be a danger to herself or others as she denies suicidal or homicidal ideations.   Discussed strict ED return precautions. Pt verbalized understanding of and agreement with plan and is safe for discharge home at this time.   10:14 PM Spoke with patient's legal guardian Nadara Mustard and informed her of pending discharge.    Final Clinical Impressions(s) / ED Diagnoses   Final diagnoses:  Encounter for medication refill    ED Discharge Orders    None       Bennye Alm 03/02/18 2243    Jacalyn Lefevre, MD 03/02/18 2259

## 2018-03-02 NOTE — BH Assessment (Addendum)
Assessment Note  Kathy Brown is an 37 y.o. female who presents voluntarily to [facility] alone reporting symptoms of schizophrenia. Pt has a history of schizophrenia. Pt reports being out of her medications.  Pt denies current suicidal ideation and denies having a plan. Pt denies past attempts.  Pt denies homicidal ideation/ history of violence. Pt denies auditory or visual hallucinations or other psychotic symptoms. Pt states current stressors include getting into an argument with her mother today and being out of her medications.   Pt lives with her mother and her 3 children and supports include her grandmother.Unable to assess history of abuse and trauma. Unable to assess family history. Pt is currently unemployed. Pt has poor insight and parial judgment. Pt's memory is intact.  Pt denies legal history.  Pt denies OP history. IP history includes an admission to [facility] in May 2015.  Pt denies alcohol/ substance abuse.  Pt is dressed in scrubs, alert, oriented x4 with tangential, loud speech and normal motor behavior. Eye contact is good. Pt's mood is sad and helpless and affect congruent with mood. Thought process is tangential. There is no indication pt is currently responding to internal stimuli or experiencing delusional thought content. Pt was cooperative throughout assessment. Pt is able to contract for safety outside the hospital and wants her medications.    Attempted to call the pt's mother Babette Relic Bascom Levels [phone]) for collateral information, unable to reach her.   Diagnosis: F20.9 Schizophrenia, by history  Past Medical History:  Past Medical History:  Diagnosis Date  . Anxiety   . Asthma   . Chronic back pain   . Schizophrenia (HCC)     History reviewed. No pertinent surgical history.  Family History:  Family History  Problem Relation Age of Onset  . Hypertension Mother   . Diabetes Mother     Social History:  reports that she has never smoked. She has never used  smokeless tobacco. She reports that she does not drink alcohol or use drugs.  Additional Social History:  Alcohol / Drug Use Pain Medications: See MAR Prescriptions: See MAR Over the Counter: See MAR History of alcohol / drug use?: No history of alcohol / drug abuse Longest period of sobriety (when/how long): NA  CIWA: CIWA-Ar BP: (!) 121/92 Pulse Rate: 93 COWS:    Allergies: No Known Allergies  Home Medications:  (Not in a hospital admission)  OB/GYN Status:  No LMP recorded. (Menstrual status: Irregular Periods).  General Assessment Data Location of Assessment: [facility] ED TTS Assessment: In system Is this a Tele or Face-to-Face Assessment?: Tele Assessment Is this an Initial Assessment or a Re-assessment for this encounter?: Initial Assessment Marital status: Single Maiden name: NA Is patient pregnant?: No Pregnancy Status: No Living Arrangements: Parent, Children(Pt lives with her mother and 3 children) Can pt return to current living arrangement?: Yes Admission Status: Voluntary Is patient capable of signing voluntary admission?: Yes Referral Source: Self/Family/Friend Insurance type: Medicare     Crisis Care Plan Living Arrangements: Parent, Children(Pt lives with her mother and 3 children) Legal Guardian: Mother(Tammy [employer] examiner) Name of Psychiatrist: None Name of Therapist: None  Education Status Is patient currently in school?: No Is the patient employed, unemployed or receiving disability?: Unemployed  Risk to self with the past 6 months Suicidal Ideation: No Has patient been a risk to self within the past 6 months prior to admission? : No Suicidal Intent: No Has patient had any suicidal intent within the past 6 months prior to admission? :  No Is patient at risk for suicide?: No Suicidal Plan?: No Has patient had any suicidal plan within the past 6 months prior to admission? : No Access to Means: No What has been your use of drugs/alcohol within the last 12  months?: Pt denies Previous Attempts/Gestures: No How many times?: 0 Other Self Harm Risks: Pt denies Triggers for Past Attempts: (NA) Intentional Self Injurious Behavior: None Family Suicide History: Unable to assess Recent stressful life event(s): Conflict (Comment)(Pt got into an argument with her mother) Persecutory voices/beliefs?: No Depression: No Substance abuse history and/or treatment for substance abuse?: No Suicide prevention information given to non-admitted patients: Not applicable  Risk to Others within the past 6 months Homicidal Ideation: No Does patient have any lifetime risk of violence toward others beyond the six months prior to admission? : No Thoughts of Harm to Others: No Current Homicidal Intent: No Current Homicidal Plan: No Access to Homicidal Means: No Identified Victim: Pt denies History of harm to others?: No Assessment of Violence: None Noted Violent Behavior Description: Pt denies Does patient have access to weapons?: No Criminal Charges Pending?: No Does patient have a court date: No Is patient on probation?: No  Psychosis Hallucinations: None noted Delusions: None noted  Mental Status Report Appearance/Hygiene: In scrubs, Disheveled Eye Contact: Good Motor Activity: Freedom of movement Speech: Tangential, Loud Level of Consciousness: Alert Mood: Sad, Helpless Affect: Sad Anxiety Level: None Thought Processes: Tangential Judgement: Partial Orientation: Person, Place, Time, Situation Obsessive Compulsive Thoughts/Behaviors: None  Cognitive Functioning Concentration: Normal Memory: Recent Intact Is patient IDD: Yes Level of Function: UTA Is patient DD?: Yes I IQ score available?: No Insight: Poor Impulse Control: Poor Appetite: Good Have you had any weight changes? : No Change Sleep: No Change Total Hours of Sleep: 8 Vegetative Symptoms: None  ADLScreening [facility] Assessment Services) Patient's cognitive ability adequate to  safely complete daily activities?: Yes Patient able to express need for assistance with ADLs?: Yes Independently performs ADLs?: Yes (appropriate for developmental age)  Prior Inpatient Therapy Prior Inpatient Therapy: Yes Prior Therapy Dates: May 2015 Prior Therapy Facilty/Provider(s): [facility] Reason for Treatment: Paranoia per chart  Prior Outpatient Therapy Prior Outpatient Therapy: No Does patient have an ACCT team?: No Does patient have Intensive In-House Services?  : No Does patient have [facility] services? : No Does patient have P4[facility] services?: No  ADL Screening (condition at time of admission) Patient's cognitive ability adequate to safely complete daily activities?: Yes Is the patient deaf or have difficulty hearing?: No Does the patient have difficulty seeing, even when wearing glasses/contacts?: No Does the patient have difficulty concentrating, remembering, or making decisions?: Yes Patient able to express need for assistance with ADLs?: Yes Does the patient have difficulty dressing or bathing?: No Independently performs ADLs?: Yes (appropriate for developmental age) Does the patient have difficulty walking or climbing stairs?: No Weakness of Legs: None Weakness of Arms/Hands: None  Home Assistive Devices/Equipment Home Assistive Devices/Equipment: None    Abuse/Neglect Assessment (Assessment to be complete while patient is alone) Abuse/Neglect Assessment Can Be Completed: Unable to assess, patient is non-responsive or altered mental status     Advance Directives (For Healthcare) Does Patient Have a Medical Advance Directive?: No Would patient like information on creating a medical advance directive?: No - Patient declined    Additional Information 1:1 In Past 12 Months?: No CIRT Risk: No Elopement Risk: No Does patient have medical clearance?: Yes     Disposition: Gave clinical report to Donell Sievert, PA who  stated pt doesn't meet criteria for inpatient  psychiatric treatment. TTS gave pt OPT resources.  Notified Michela Pitcher, [place] of recommendation, unable to reach [place], Charity fundraiser. Disposition Initial Assessment Completed for this Encounter: Yes Disposition of Patient: (Pt doesn't meet criteria for inpatient) Patient refused recommended treatment: No Mode of transportation if patient is discharged?: N/A Patient referred to: (NA)  On Site Evaluation by:   Reviewed with Physician:    Annamaria Boots, MS, [facility] Therapeutic Triage Specialist  Annamaria Boots 03/02/2018 9:51 PM

## 2018-04-02 DIAGNOSIS — F849 Pervasive developmental disorder, unspecified: Secondary | ICD-10-CM | POA: Diagnosis not present

## 2018-04-02 DIAGNOSIS — F250 Schizoaffective disorder, bipolar type: Secondary | ICD-10-CM | POA: Diagnosis not present

## 2018-04-02 DIAGNOSIS — F331 Major depressive disorder, recurrent, moderate: Secondary | ICD-10-CM | POA: Diagnosis not present

## 2018-05-04 DIAGNOSIS — F331 Major depressive disorder, recurrent, moderate: Secondary | ICD-10-CM | POA: Diagnosis not present

## 2018-05-04 DIAGNOSIS — F849 Pervasive developmental disorder, unspecified: Secondary | ICD-10-CM | POA: Diagnosis not present

## 2018-05-04 DIAGNOSIS — F250 Schizoaffective disorder, bipolar type: Secondary | ICD-10-CM | POA: Diagnosis not present

## 2018-06-04 DIAGNOSIS — F849 Pervasive developmental disorder, unspecified: Secondary | ICD-10-CM | POA: Diagnosis not present

## 2018-06-04 DIAGNOSIS — F331 Major depressive disorder, recurrent, moderate: Secondary | ICD-10-CM | POA: Diagnosis not present

## 2018-06-04 DIAGNOSIS — F250 Schizoaffective disorder, bipolar type: Secondary | ICD-10-CM | POA: Diagnosis not present

## 2018-06-05 ENCOUNTER — Emergency Department (HOSPITAL_COMMUNITY)
Admission: EM | Admit: 2018-06-05 | Discharge: 2018-06-05 | Disposition: A | Discharge status: Home / Self Care | Payer: Medicare Other | Attending: Emergency Medicine | Admitting: Emergency Medicine

## 2018-06-05 ENCOUNTER — Encounter (HOSPITAL_COMMUNITY): Payer: Self-pay

## 2018-06-05 ENCOUNTER — Other Ambulatory Visit: Payer: Self-pay

## 2018-06-05 DIAGNOSIS — K029 Dental caries, unspecified: Secondary | ICD-10-CM | POA: Insufficient documentation

## 2018-06-05 DIAGNOSIS — Z7982 Long term (current) use of aspirin: Secondary | ICD-10-CM | POA: Diagnosis not present

## 2018-06-05 DIAGNOSIS — Z79899 Other long term (current) drug therapy: Secondary | ICD-10-CM | POA: Diagnosis not present

## 2018-06-05 DIAGNOSIS — J45909 Unspecified asthma, uncomplicated: Secondary | ICD-10-CM | POA: Insufficient documentation

## 2018-06-05 DIAGNOSIS — R112 Nausea with vomiting, unspecified: Secondary | ICD-10-CM | POA: Diagnosis not present

## 2018-06-05 DIAGNOSIS — K0889 Other specified disorders of teeth and supporting structures: Secondary | ICD-10-CM | POA: Diagnosis not present

## 2018-06-05 MED ORDER — IBUPROFEN 600 MG PO TABS: 600.0000 mg | ORAL_TABLET | Freq: Four times a day (QID) | ORAL | 0 refills | Status: DC | PRN

## 2018-06-05 MED ORDER — IBUPROFEN 200 MG PO TABS
600.0000 mg | ORAL_TABLET | Freq: Once | ORAL | Status: AC
  Administered 2018-06-05: 600 mg via ORAL
  Filled 2018-06-05: qty 3

## 2018-06-05 NOTE — ED Notes (Signed)
Called mother after calling 3x times in a row and after Patty RN has called and left messages. Mother stated she is not picking daughter up and did not know daughter was at ED. I informed mother that she is the legal guardian and needs to pick patient up. Mother asked how patient ended up at ED and I informed mother that patient came by ambulance, mother then replied "well I guess she's gonna have to ride back in one then". I informed mother that she does not fit the criteria for ambulance transportation. Patients guardian then replied "well I don't have a ride anyway" and then stated she informed daughter she did not need to go to ED. RN replied "Kathy Nissenk ma'am, have a nice day" and ended phone conversation.

## 2018-06-05 NOTE — ED Notes (Signed)
Bed: [id number] Expected date:  Expected time:  Means of arrival:  Comments: Room 16

## 2018-06-05 NOTE — ED Notes (Signed)
Spoke with Kathy MaduroRobert (pts uncle) who states he will contact her mother and grandmother to arrange transport home. He has been given our contact number to keep us updated on transportation.

## 2018-06-05 NOTE — Progress Notes (Signed)
CSW spoke with patient's mother, Nadara Mustardamary Frazier, [phone]. Lottie Dawsonamary states she is NOT the patient's legal guardian, but is trying to work toward guardianship in an effort to care for the patient's children.  [name] expressed frustration with the patient arriving to the hospital via EMS for tooth pain, stating the patient has a dentist appointment next week. Lottie Dawsonamary states she is unable to pick up the patient and does not have family to help transport the patient home either.  CSW confirmed address on patient's facesheet and will determine if a taxi voucher is an appropriate option for the patient to transport home.  Enid Cutterharlotte Jujuan Dugo, LCSW-A Clinical Social Worker [phone]

## 2018-06-05 NOTE — ED Notes (Signed)
Bed: [id number] Expected date:  Expected time:  Means of arrival:  Comments: 37 yo toothache

## 2018-06-05 NOTE — ED Notes (Signed)
Spoke with Kathy MaduroRobert (pts uncle) again, he states he spoke with her mother but does not know the plan. Tried to contact Mother without answer again.

## 2018-06-05 NOTE — ED Triage Notes (Signed)
Pt comes from home. Pt brought in via [facility]. Pt called 911 due to dental pain bilateral lower teeth. Pt stated it has been going on for about 2 weeks. Pt is AOx4 and Ambulatory.

## 2018-06-05 NOTE — Progress Notes (Addendum)
CSW attempted to contacted patient's mother and legal guardian, Kathy Brown, [phone]. The call was unanswered.   CSW met with patient at bedside in regard to a neglect consult.   CSW contacted [employer] and CPS hotline. A report will be made once the call is returned.   Update 3:00pm APS report completed. Waiting to complete CPS report.   Update 4:00pm CPS report completed.    Stephanie Acre, [place] Social Worker [phone]

## 2018-06-05 NOTE — ED Notes (Signed)
Notified ED Provider. RN will speak with social work once they are back on floor.

## 2018-06-05 NOTE — ED Notes (Addendum)
Spoke with Social Work. RN and Social Work have spoken to patient.

## 2018-06-05 NOTE — ED Triage Notes (Signed)
Pt stated she lives by herself, however pt chart is stating pt has Legal Guardian.
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
## 2018-06-05 NOTE — ED Provider Notes (Signed)
[facility]-EMERGENCY DEPT Provider Note   CSN: [id number] Arrival date & time: 06/05/18  0930     History   Chief Complaint Chief Complaint  Patient presents with  . Dental Pain    HPI Kathy Brown is a 37 y.o. female.  37 year old female presents with chronic mouth pain from very poor dental hygiene.  Patient denies any fever or chills.  Has not been seen by dentist.  Does have a history of schizoaffective disorder.  Has not been medicating with medication at this time.     Past Medical History:  Diagnosis Date  . Anxiety   . Asthma   . Chronic back pain   . Schizophrenia [facility])     Patient Active Problem List   Diagnosis Date Noted  . Atrial fibrillation with RVR (HCC) 10/15/2017  . Auditory hallucinations   . Paranoid schizophrenia (HCC)   . Schizoaffective disorder (HCC) 03/25/2014  . Psychosis (HCC) 03/25/2014    History reviewed. No pertinent surgical history.   OB History   None      Home Medications    Prior to Admission medications   Medication Sig Start Date End Date Taking? Authorizing Provider  acetaminophen (TYLENOL) 325 MG tablet Take 2 tablets (650 mg total) by mouth every 6 (six) hours as needed. Patient not taking: Reported on 03/02/2018 11/05/17   Elisha Ponder, PA-C  aspirin EC 81 MG tablet Take 1 tablet (81 mg total) by mouth daily. Patient not taking: Reported on 03/02/2018 10/18/17   Maxie Barb, MD  benztropine (COGENTIN) 0.5 MG tablet Take 1 tablet (0.5 mg total) by mouth 2 (two) times daily. Patient not taking: Reported on 03/02/2018 08/25/16   Hedges, Tinnie Gens, PA-C  diltiazem (CARDIZEM CD) 120 MG 24 hr capsule Take 1 capsule (120 mg total) by mouth daily. Patient not taking: Reported on 03/02/2018 10/18/17 10/18/18  Maxie Barb, MD  FLUoxetine (PROZAC) 20 MG tablet Take 1 tablet (20 mg total) by mouth daily. Patient not taking: Reported on 03/02/2018 12/15/16   Jaynie Crumble, PA-C    haloperidol (HALDOL) 2 MG tablet Take 1 tablet (2 mg total) by mouth at bedtime. Patient not taking: Reported on 03/02/2018 11/25/16   Garlon Hatchet, PA-C  hydrOXYzine (ATARAX/VISTARIL) 25 MG tablet Take 1 tablet (25 mg total) by mouth 2 (two) times daily as needed for anxiety. Patient not taking: Reported on 03/02/2018 11/25/16   Garlon Hatchet, PA-C  hydrOXYzine (ATARAX/VISTARIL) 25 MG tablet Take 1 tablet (25 mg total) by mouth 2 (two) times daily. Patient not taking: Reported on 03/02/2018 12/15/16   Jaynie Crumble, PA-C  metoprolol tartrate (LOPRESSOR) 25 MG tablet Take 1 tablet (25 mg total) by mouth 2 (two) times daily. Patient not taking: Reported on 03/02/2018 10/18/17   Maxie Barb, MD  traZODone (DESYREL) 50 MG tablet Take 0.5 tablets (25 mg total) by mouth at bedtime as needed for sleep. Patient not taking: Reported on 03/02/2018 04/25/15   Toy Cookey, MD    Family History Family History  Problem Relation Age of Onset  . Hypertension Mother   . Diabetes Mother     Social History Social History   Tobacco Use  . Smoking status: Never Smoker  . Smokeless tobacco: Never Used  Substance Use Topics  . Alcohol use: No  . Drug use: No     Allergies   Patient has no known allergies.   Review of Systems Review of Systems  All other systems reviewed and  are negative.    Physical Exam Updated Vital Signs BP (!) 142/96 (BP Location: Right Arm)   Pulse (!) 115   Resp 19   Ht 1.626 m (5\' 4" )   Wt 102.5 kg   SpO2 98%   BMI 38.79 kg/m   Physical Exam  Constitutional: She is oriented to person, place, and time. She appears well-developed and well-nourished.  Non-toxic appearance.  HENT:  Head: Normocephalic and atraumatic.  Multiple areas of dental caries with tooth fractures and gingival hyperplasia without abscess.  Eyes: Pupils are equal, round, and reactive to light. Conjunctivae are normal.  Neck: Normal range of motion.  Cardiovascular: Normal  rate.  Pulmonary/Chest: Effort normal.  Neurological: She is alert and oriented to person, place, and time.  Skin: Skin is warm and dry.  Psychiatric: She has a normal mood and affect.  Nursing note and vitals reviewed.    ED Treatments / Results  Labs (all labs ordered are listed, but only abnormal results are displayed) Labs Reviewed - No data to display  EKG None  Radiology No results found.  Procedures Procedures (including critical care time)  Medications Ordered in ED Medications  ibuprofen (ADVIL,MOTRIN) tablet 600 mg (has no administration in time range)     Initial Impression / Assessment and Plan / ED Course  I have reviewed the triage vital signs and the nursing notes.  Pertinent labs & imaging results that were available during my care of the patient were reviewed by me and considered in my medical decision making (see chart for details).     Patient given Motrin here and will prescribe same and give referral to oral surgery.  On my exam heart rate is 78 when I talked her in the room.  Final Clinical Impressions(s) / ED Diagnoses   Final diagnoses:  None    ED Discharge Orders    None       Lorre NickAllen, Yussef Jorge, MD 06/05/18 [phone]

## 2018-06-05 NOTE — ED Notes (Signed)
ED Provider at bedside. 

## 2018-06-05 NOTE — ED Notes (Signed)
Left message for mother to called back regarding transport home for pt.

## 2018-06-17 NOTE — Pre-Procedure Instructions (Signed)
History discussed with Dr. Rondall AllegraS. Turk; pt. was hospitalized 09/2017 with atrial fib., no record of follow-up with cardiologist.  Will need cardiac clearance.  Sam at Dr. Randa EvensJensen's office notified.

## 2018-06-23 NOTE — Progress Notes (Signed)
Spoke with Lelon MastSamantha at Dr. Chipper HerbJenson's office that pt needs cardiac clearance before surgery can be done here. Also we have been unable to reach her to do her history. Explained that Lelon MastSamantha should call centralized scheduling to cancel her surgery. Lelon MastSamantha has been unable to reach her as well.

## 2018-06-24 ENCOUNTER — Ambulatory Visit (HOSPITAL_BASED_OUTPATIENT_CLINIC_OR_DEPARTMENT_OTHER): Admission: RE | Admit: 2018-06-24 | Payer: Medicare Other | Source: Ambulatory Visit | Admitting: Oral Surgery

## 2018-06-24 ENCOUNTER — Encounter (HOSPITAL_BASED_OUTPATIENT_CLINIC_OR_DEPARTMENT_OTHER): Admission: RE | Payer: Self-pay | Source: Ambulatory Visit

## 2018-06-24 SURGERY — MULTIPLE EXTRACTION WITH ALVEOLOPLASTY
Anesthesia: General | Laterality: Bilateral

## 2018-07-02 DIAGNOSIS — F250 Schizoaffective disorder, bipolar type: Secondary | ICD-10-CM | POA: Diagnosis not present

## 2018-07-02 DIAGNOSIS — F331 Major depressive disorder, recurrent, moderate: Secondary | ICD-10-CM | POA: Diagnosis not present

## 2018-07-02 DIAGNOSIS — F849 Pervasive developmental disorder, unspecified: Secondary | ICD-10-CM | POA: Diagnosis not present

## 2018-07-05 DIAGNOSIS — K029 Dental caries, unspecified: Secondary | ICD-10-CM | POA: Diagnosis not present

## 2018-07-05 DIAGNOSIS — R32 Unspecified urinary incontinence: Secondary | ICD-10-CM | POA: Diagnosis not present

## 2018-07-09 ENCOUNTER — Other Ambulatory Visit: Payer: Self-pay

## 2018-07-09 ENCOUNTER — Encounter (HOSPITAL_COMMUNITY): Payer: Self-pay

## 2018-07-09 ENCOUNTER — Emergency Department (HOSPITAL_COMMUNITY)
Admission: EM | Admit: 2018-07-09 | Discharge: 2018-07-09 | Disposition: A | Discharge status: Home / Self Care | Payer: Medicare Other | Attending: Emergency Medicine | Admitting: Emergency Medicine

## 2018-07-09 DIAGNOSIS — Z79899 Other long term (current) drug therapy: Secondary | ICD-10-CM | POA: Diagnosis not present

## 2018-07-09 DIAGNOSIS — I4891 Unspecified atrial fibrillation: Secondary | ICD-10-CM | POA: Insufficient documentation

## 2018-07-09 DIAGNOSIS — J45909 Unspecified asthma, uncomplicated: Secondary | ICD-10-CM | POA: Insufficient documentation

## 2018-07-09 DIAGNOSIS — R443 Hallucinations, unspecified: Secondary | ICD-10-CM | POA: Diagnosis not present

## 2018-07-09 DIAGNOSIS — R456 Violent behavior: Secondary | ICD-10-CM | POA: Diagnosis present

## 2018-07-09 DIAGNOSIS — F250 Schizoaffective disorder, bipolar type: Secondary | ICD-10-CM | POA: Insufficient documentation

## 2018-07-09 DIAGNOSIS — F259 Schizoaffective disorder, unspecified: Secondary | ICD-10-CM | POA: Diagnosis present

## 2018-07-09 HISTORY — DX: Unspecified urinary incontinence: R32

## 2018-07-09 LAB — COMPREHENSIVE METABOLIC PANEL
ALBUMIN: 4.1 g/dL (ref 3.5–5.0)
ALT: 12 U/L (ref 0–44)
AST: 20 U/L (ref 15–41)
Alkaline Phosphatase: 55 U/L (ref 38–126)
Anion gap: 9 (ref 5–15)
BUN: 8 mg/dL (ref 6–20)
CHLORIDE: 105 mmol/L (ref 98–111)
CO2: 26 mmol/L (ref 22–32)
CREATININE: 0.82 mg/dL (ref 0.44–1.00)
Calcium: 9.2 mg/dL (ref 8.9–10.3)
GFR calc Af Amer: >60 mL/min (ref >60)
GLUCOSE: 96 mg/dL (ref 70–99)
POTASSIUM: 4 mmol/L (ref 3.5–5.1)
Sodium: 140 mmol/L (ref 135–145)
Total Bilirubin: 0.4 mg/dL (ref 0.3–1.2)
Total Protein: 7.9 g/dL (ref 6.5–8.1)

## 2018-07-09 LAB — RAPID URINE DRUG SCREEN, HOSP PERFORMED
Amphetamines: NOT DETECTED
Barbiturates: NOT DETECTED
Benzodiazepines: NOT DETECTED
Cocaine: NOT DETECTED
Opiates: NOT DETECTED
TETRAHYDROCANNABINOL: NOT DETECTED

## 2018-07-09 LAB — URINALYSIS, ROUTINE W REFLEX MICROSCOPIC
Bilirubin Urine: NEGATIVE
GLUCOSE, UA: NEGATIVE mg/dL
Hgb urine dipstick: NEGATIVE
KETONES UR: NEGATIVE mg/dL
Leukocytes, UA: NEGATIVE
Nitrite: NEGATIVE
PH: 5 (ref 5.0–8.0)
PROTEIN: NEGATIVE mg/dL
Specific Gravity, Urine: 1.008 (ref 1.005–1.030)

## 2018-07-09 LAB — CBC
HEMATOCRIT: 36.9 % (ref 36.0–46.0)
HEMOGLOBIN: 11.8 g/dL — AB (ref 12.0–15.0)
MCH: 26.7 pg (ref 26.0–34.0)
MCHC: 32 g/dL (ref 30.0–36.0)
MCV: 83.5 fL (ref 78.0–100.0)
Platelets: 329 10*3/uL (ref 150–400)
RBC: 4.42 MIL/uL (ref 3.87–5.11)
RDW: 15.8 % — AB (ref 11.5–15.5)
WBC: 7.5 10*3/uL (ref 4.0–10.5)

## 2018-07-09 LAB — I-STAT BETA HCG BLOOD, ED (MC, WL, AP ONLY)

## 2018-07-09 LAB — ETHANOL: Alcohol, Ethyl (B): <10 mg/dL (ref <10)

## 2018-07-09 MED ORDER — HALOPERIDOL 2 MG PO TABS
2.0000 mg | ORAL_TABLET | Freq: Every day | ORAL | Status: DC
  Filled 2018-07-09: qty 1

## 2018-07-09 MED ORDER — BENZTROPINE MESYLATE 0.5 MG PO TABS
0.5000 mg | ORAL_TABLET | Freq: Two times a day (BID) | ORAL | Status: DC
  Administered 2018-07-09: 0.5 mg via ORAL
  Filled 2018-07-09: qty 1

## 2018-07-09 MED ORDER — DILTIAZEM HCL ER COATED BEADS 120 MG PO CP24
120.0000 mg | ORAL_CAPSULE | Freq: Every day | ORAL | Status: DC
  Administered 2018-07-09: 120 mg via ORAL
  Filled 2018-07-09: qty 1

## 2018-07-09 MED ORDER — FLUOXETINE HCL 20 MG PO CAPS
20.0000 mg | ORAL_CAPSULE | Freq: Every day | ORAL | Status: DC
  Administered 2018-07-09: 20 mg via ORAL
  Filled 2018-07-09: qty 1

## 2018-07-09 MED ORDER — METOPROLOL TARTRATE 25 MG PO TABS
25.0000 mg | ORAL_TABLET | Freq: Two times a day (BID) | ORAL | Status: DC
  Administered 2018-07-09: 25 mg via ORAL
  Filled 2018-07-09: qty 1

## 2018-07-09 MED ORDER — OLANZAPINE 5 MG PO TABS
5.0000 mg | ORAL_TABLET | Freq: Once | ORAL | Status: AC
  Administered 2018-07-09: 5 mg via ORAL
  Filled 2018-07-09: qty 1

## 2018-07-09 MED ORDER — HYDROXYZINE HCL 25 MG PO TABS
25.0000 mg | ORAL_TABLET | Freq: Two times a day (BID) | ORAL | Status: DC
  Administered 2018-07-09: 25 mg via ORAL
  Filled 2018-07-09: qty 1

## 2018-07-09 MED ORDER — TRAZODONE HCL 50 MG PO TABS
25.0000 mg | ORAL_TABLET | Freq: Every evening | ORAL | Status: DC | PRN
  Administered 2018-07-09: 25 mg via ORAL
  Filled 2018-07-09: qty 1

## 2018-07-09 NOTE — ED Notes (Signed)
RN called pt's mother for transport home.

## 2018-07-09 NOTE — ED Provider Notes (Signed)
[facility]-EMERG[facility] DEPT Provider Note   CSN: [id number] Arrival date & time: 07/09/18  1149     History   Chief Complaint Chief Complaint  Patient presents with  . Medical Clearance    HPI Kathy Brown is a 37 y.o. female.  Pt presents to the ED today for violent behavior.  The pt has a hx of schizophrenia and has not been taking her meds.  Pt said she goes to a day program and the people there call her "pee pot" because she's incontinent.  Pt is getting frustrated and has been yelling at the people there.  The pt's mother brings pt in because she has been getting more verbally abusive at home.  Mom in taking care of pt's 3 children and can't take care of pt also.  The pt was witnessed yelling at her mother in triage.  She denies si/hi.     Past Medical History:  Diagnosis Date  . Anxiety   . Asthma   . Chronic back pain   . Schizophrenia (HCC)   . Urinary incontinence     Patient Active Problem List   Diagnosis Date Noted  . Atrial fibrillation with RVR (HCC) 10/15/2017  . Auditory hallucinations   . Paranoid schizophrenia (HCC)   . Schizoaffective disorder (HCC) 03/25/2014  . Psychosis (HCC) 03/25/2014    History reviewed. No pertinent surgical history.   OB History   None      Home Medications    Prior to Admission medications   Medication Sig Start Date End Date Taking? Authorizing Provider  amoxicillin (AMOXIL) 875 MG tablet Take 875 mg by mouth every 12 (twelve) hours. 07/05/18  Yes [provider]  haloperidol decanoate (HALDOL DECANOATE) 100 MG/ML injection Inject 1 mL into the muscle every 28 (twenty-eight) days. 06/26/18  Yes [provider]  hydrOXYzine (ATARAX/VISTARIL) 25 MG tablet Take 1 tablet (25 mg total) by mouth 2 (two) times daily. 12/15/16  Yes Kirichenko, Tatyana, PA-C  Melatonin 5 MG TBDP Place 5 mg under the tongue at bedtime. 06/26/18  Yes [provider]  benztropine (COGENTIN) 0.5 MG  tablet Take 1 tablet (0.5 mg total) by mouth 2 (two) times daily. Patient not taking: Reported on 03/02/2018 08/25/16   Hedges, Tinnie Gens, PA-C  diltiazem (CARDIZEM CD) 120 MG 24 hr capsule Take 1 capsule (120 mg total) by mouth daily. Patient not taking: Reported on 03/02/2018 10/18/17 10/18/18  Maxie Barb, MD  FLUoxetine (PROZAC) 20 MG tablet Take 1 tablet (20 mg total) by mouth daily. Patient not taking: Reported on 03/02/2018 12/15/16   Jaynie Crumble, PA-C  haloperidol (HALDOL) 2 MG tablet Take 1 tablet (2 mg total) by mouth at bedtime. Patient not taking: Reported on 03/02/2018 11/25/16   Garlon Hatchet, PA-C  hydrOXYzine (ATARAX/VISTARIL) 25 MG tablet Take 1 tablet (25 mg total) by mouth 2 (two) times daily as needed for anxiety. Patient not taking: Reported on 03/02/2018 11/25/16   Garlon Hatchet, PA-C  metoprolol tartrate (LOPRESSOR) 25 MG tablet Take 1 tablet (25 mg total) by mouth 2 (two) times daily. Patient not taking: Reported on 03/02/2018 10/18/17   Maxie Barb, MD  traZODone (DESYREL) 50 MG tablet Take 0.5 tablets (25 mg total) by mouth at bedtime as needed for sleep. Patient not taking: Reported on 03/02/2018 04/25/15   Toy Cookey, MD    Family History Family History  Problem Relation Age of Onset  . Hypertension Mother   . Diabetes Mother  Social History Social History   Tobacco Use  . Smoking status: Never Smoker  . Smokeless tobacco: Never Used  Substance Use Topics  . Alcohol use: No  . Drug use: No     Allergies   Patient has no known allergies.   Review of Systems Review of Systems  Psychiatric/Behavioral: Positive for hallucinations.  All other systems reviewed and are negative.    Physical Exam Updated Vital Signs BP (!) 157/108 (BP Location: Left Arm)   Pulse 75   Temp 98.1 F (36.7 C) (Oral)   Resp 18   Ht 5\' 5"  (1.651 m)   Wt 102.5 kg   SpO2 98%   BMI 37.60 kg/m   Physical Exam  Constitutional: She is oriented to  person, place, and time. She appears well-developed and well-nourished.  HENT:  Head: Normocephalic and atraumatic.  Right Ear: External ear normal.  Left Ear: External ear normal.  Nose: Nose normal.  Poor dentition  Eyes: Pupils are equal, round, and reactive to light. Conjunctivae and EOM are normal.  Neck: Normal range of motion. Neck supple.  Cardiovascular: Normal rate, normal heart sounds and intact distal pulses. An irregularly irregular rhythm present.  Pulmonary/Chest: Effort normal and breath sounds normal.  Abdominal: Soft. Bowel sounds are normal.  Musculoskeletal: Normal range of motion.  Neurological: She is alert and oriented to person, place, and time.  Skin: Skin is warm and dry. Capillary refill takes less than 2 seconds.  Psychiatric: Her speech is normal. Her mood appears anxious. She is agitated and actively hallucinating.  Nursing note and vitals reviewed.    ED Treatments / Results  Labs (all labs ordered are listed, but only abnormal results are displayed) Labs Reviewed  CBC - Abnormal; Notable for the following components:      Result Value   Hemoglobin 11.8 (*)    RDW 15.8 (*)    All other components within normal limits  COMPREHENSIVE METABOLIC PANEL  ETHANOL  RAPID URINE DRUG SCREEN, HOSP PERFORMED  URINALYSIS, ROUTINE W REFLEX MICROSCOPIC  I-STAT BETA HCG BLOOD, ED ([facility], [facility], [facility] ONLY)    EKG EKG Interpretation  Date/Time:  Friday July 09 2018 15:47:48 EDT Ventricular Rate:  98 PR Interval:    QRS Duration: 78 QT Interval:  346 QTC Calculation: 441 R Axis:   105 Text Interpretation:  Atrial fibrillation Rightward axis Abnormal QRS-T angle, consider primary T wave abnormality Abnormal ECG Confirmed by Jacalyn LefevreHaviland, Jayleana Colberg [phone]([id number]) on 07/09/2018 3:53:50 PM   Radiology No results found.  Procedures Procedures (including critical care time)  Medications Ordered in ED Medications  benztropine (COGENTIN) tablet 0.5 mg (has no administration  in time range)  FLUoxetine (PROZAC) tablet 20 mg (has no administration in time range)  haloperidol (HALDOL) tablet 2 mg (has no administration in time range)  hydrOXYzine (ATARAX/VISTARIL) tablet 25 mg (has no administration in time range)  metoprolol tartrate (LOPRESSOR) tablet 25 mg (has no administration in time range)  traZODone (DESYREL) tablet 25 mg (has no administration in time range)  diltiazem (CARDIZEM CD) 24 hr capsule 120 mg (has no administration in time range)  OLANZapine (ZYPREXA) tablet 5 mg (has no administration in time range)     Initial Impression / Assessment and Plan / ED Course  I have reviewed the triage vital signs and the nursing notes.  Pertinent labs & imaging results that were available during my care of the patient were reviewed by me and considered in my medical decision making (see chart for details).  Pt does have a hx of afib, but is not a candidate for anticoagulation according to cardiology on her admission in December of 2018 due to her psych issues.   The pt was seen by psych and she does not meet admission criteria.  Pt's mom will take her home.   Final Clinical Impressions(s) / ED Diagnoses   Final diagnoses:  Schizoaffective disorder, bipolar type [facility])    ED Discharge Orders         Ordered    Increase activity slowly     07/09/18 1522    Diet - low sodium heart healthy     07/09/18 1522    Discharge instructions    Comments:  Follow up with outpatient provdier   07/09/18 1522           Jacalyn Lefevre, MD 07/09/18 [phone]

## 2018-07-09 NOTE — ED Triage Notes (Addendum)
Patient's mother reports that the patient goes to a Day Program and the patient has violent behavior, but has never hit anybody. Patient has verbal abusive behavior. Patient states that people make fun of her and call her "Pee Pot". Patient's mother reports that the patient is also incontinent.  Patient's mother states the patient is verbally abusive at home and the mother states she is unable to care for her. Patient's mother states that she takes care of the patient's three kids and can not do both.  (Patient was yelling at her mother in triage. Patient's mother's cell phone rang and the patient yelled out that she did not like that and it was hurting her ear)
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
## 2018-07-09 NOTE — ED Notes (Signed)
Bed: [facility] Expected date:  Expected time:  Means of arrival:  Comments: 

## 2018-07-09 NOTE — BH Assessment (Signed)
[facility] Assessment Progress Note Case was staffed with Shaune PollackLord DNP who recommended patient be evaluated for possible medication interventions to assist with symptom management and be discharged later this date.

## 2018-07-30 DIAGNOSIS — F849 Pervasive developmental disorder, unspecified: Secondary | ICD-10-CM | POA: Diagnosis not present

## 2018-07-30 DIAGNOSIS — F250 Schizoaffective disorder, bipolar type: Secondary | ICD-10-CM | POA: Diagnosis not present

## 2018-07-30 DIAGNOSIS — F331 Major depressive disorder, recurrent, moderate: Secondary | ICD-10-CM | POA: Diagnosis not present

## 2018-08-27 DIAGNOSIS — F250 Schizoaffective disorder, bipolar type: Secondary | ICD-10-CM | POA: Diagnosis not present

## 2018-08-27 DIAGNOSIS — F331 Major depressive disorder, recurrent, moderate: Secondary | ICD-10-CM | POA: Diagnosis not present

## 2018-08-27 DIAGNOSIS — F849 Pervasive developmental disorder, unspecified: Secondary | ICD-10-CM | POA: Diagnosis not present

## 2018-09-02 ENCOUNTER — Encounter (HOSPITAL_BASED_OUTPATIENT_CLINIC_OR_DEPARTMENT_OTHER): Payer: Self-pay | Admitting: *Deleted

## 2018-09-02 ENCOUNTER — Other Ambulatory Visit: Payer: Self-pay

## 2018-09-07 NOTE — H&P (Signed)
HISTORY AND PHYSICAL  Kathy Brown is a 37 y.o. female patient with CC: referred by general dentist for full mouth dental extractions.  No diagnosis found.  Past Medical History:  Diagnosis Date  . Anxiety   . Chronic back pain   . Schizophrenia (HCC)   . Urinary incontinence     No current facility-administered medications for this encounter.    Current Outpatient Medications  Medication Sig Dispense Refill  . benztropine (COGENTIN) 0.5 MG tablet Take 1 tablet (0.5 mg total) by mouth 2 (two) times daily. (Patient not taking: Reported on 03/02/2018) 60 tablet 0  . diltiazem (CARDIZEM CD) 120 MG 24 hr capsule Take 1 capsule (120 mg total) by mouth daily. (Patient not taking: Reported on 03/02/2018) 30 capsule 0  . FLUoxetine (PROZAC) 20 MG tablet Take 1 tablet (20 mg total) by mouth daily. (Patient not taking: Reported on 03/02/2018) 30 tablet 0  . haloperidol (HALDOL) 2 MG tablet Take 1 tablet (2 mg total) by mouth at bedtime. (Patient not taking: Reported on 03/02/2018) 15 tablet 0  . haloperidol decanoate (HALDOL DECANOATE) 100 MG/ML injection Inject 1 mL into the muscle every 28 (twenty-eight) days.  4  . hydrOXYzine (ATARAX/VISTARIL) 25 MG tablet Take 1 tablet (25 mg total) by mouth 2 (two) times daily as needed for anxiety. (Patient not taking: Reported on 03/02/2018) 30 tablet 0  . hydrOXYzine (ATARAX/VISTARIL) 25 MG tablet Take 1 tablet (25 mg total) by mouth 2 (two) times daily. 12 tablet 0  . Melatonin 5 MG TBDP Place 5 mg under the tongue at bedtime.  1  . metoprolol tartrate (LOPRESSOR) 25 MG tablet Take 1 tablet (25 mg total) by mouth 2 (two) times daily. (Patient not taking: Reported on 03/02/2018) 60 tablet 0  . traZODone (DESYREL) 50 MG tablet Take 0.5 tablets (25 mg total) by mouth at bedtime as needed for sleep. (Patient not taking: Reported on 03/02/2018) 10 tablet 0   No Known Allergies Active Problems:   * No active hospital problems. *  Vitals: Height 5\' 5"  (1.651 m),  weight 95.3 kg, last menstrual period 08/27/2018. Lab results:No results found for this or any previous visit (from the past 24 hour(s)). Radiology Results: No results found. General appearance: alert, cooperative, morbidly obese and slowed mentation Head: Normocephalic, without obvious abnormality, atraumatic Eyes: negative Nose: Nares normal. Septum midline. Mucosa normal. No drainage or sinus tenderness. Throat: gross dental caries, retained roots. pharynx clear Neck: no adenopathy, supple, symmetrical, trachea midline and thyroid not enlarged, symmetric, no tenderness/mass/nodules Resp: clear to auscultation bilaterally Cardio: regular rate and rhythm, S1, S2 normal, no murmur, click, rub or gallop  Assessment: all teeth nonrestorable secondary to dental caries and severe  periodontiti  Plan: full mouth dental extractions with alveoloplasty. GA. Day surgery.    Ocie DoyneScott Ciro Tashiro 09/07/2018

## 2018-09-08 ENCOUNTER — Other Ambulatory Visit: Payer: Self-pay

## 2018-09-08 ENCOUNTER — Ambulatory Visit (HOSPITAL_BASED_OUTPATIENT_CLINIC_OR_DEPARTMENT_OTHER): Payer: Medicare Other | Admitting: Anesthesiology

## 2018-09-08 ENCOUNTER — Encounter (HOSPITAL_COMMUNITY)
Admission: RE | Disposition: A | Discharge status: Home / Self Care | Payer: Self-pay | Source: Home / Self Care | Attending: Internal Medicine

## 2018-09-08 ENCOUNTER — Encounter (HOSPITAL_BASED_OUTPATIENT_CLINIC_OR_DEPARTMENT_OTHER): Payer: Self-pay | Admitting: *Deleted

## 2018-09-08 ENCOUNTER — Inpatient Hospital Stay (HOSPITAL_BASED_OUTPATIENT_CLINIC_OR_DEPARTMENT_OTHER)
Admission: RE | Admit: 2018-09-08 | Discharge: 2018-09-10 | DRG: 309 | Disposition: A | Discharge status: Home / Self Care | Payer: Medicare Other | Attending: Internal Medicine | Admitting: Internal Medicine

## 2018-09-08 ENCOUNTER — Ambulatory Visit (HOSPITAL_COMMUNITY): Payer: Medicare Other

## 2018-09-08 DIAGNOSIS — I4819 Other persistent atrial fibrillation: Secondary | ICD-10-CM | POA: Diagnosis not present

## 2018-09-08 DIAGNOSIS — I4891 Unspecified atrial fibrillation: Secondary | ICD-10-CM | POA: Diagnosis present

## 2018-09-08 DIAGNOSIS — K029 Dental caries, unspecified: Secondary | ICD-10-CM | POA: Diagnosis not present

## 2018-09-08 DIAGNOSIS — F200 Paranoid schizophrenia: Secondary | ICD-10-CM | POA: Diagnosis present

## 2018-09-08 DIAGNOSIS — Z79899 Other long term (current) drug therapy: Secondary | ICD-10-CM | POA: Diagnosis not present

## 2018-09-08 DIAGNOSIS — K0889 Other specified disorders of teeth and supporting structures: Secondary | ICD-10-CM | POA: Diagnosis not present

## 2018-09-08 DIAGNOSIS — Z9114 Patient's other noncompliance with medication regimen: Secondary | ICD-10-CM

## 2018-09-08 DIAGNOSIS — R918 Other nonspecific abnormal finding of lung field: Secondary | ICD-10-CM | POA: Diagnosis not present

## 2018-09-08 DIAGNOSIS — Z6841 Body Mass Index (BMI) 40.0 and over, adult: Secondary | ICD-10-CM | POA: Diagnosis not present

## 2018-09-08 DIAGNOSIS — K0530 Chronic periodontitis, unspecified: Secondary | ICD-10-CM | POA: Diagnosis not present

## 2018-09-08 DIAGNOSIS — R220 Localized swelling, mass and lump, head: Secondary | ICD-10-CM | POA: Diagnosis not present

## 2018-09-08 HISTORY — PX: TOOTH EXTRACTION: SHX859

## 2018-09-08 HISTORY — DX: Cardiac arrhythmia, unspecified: I49.9

## 2018-09-08 LAB — COMPREHENSIVE METABOLIC PANEL
ALT: 11 U/L (ref 0–44)
ANION GAP: 12 (ref 5–15)
AST: 30 U/L (ref 15–41)
Albumin: 3.7 g/dL (ref 3.5–5.0)
Alkaline Phosphatase: 48 U/L (ref 38–126)
BILIRUBIN TOTAL: 0.7 mg/dL (ref 0.3–1.2)
BUN: 7 mg/dL (ref 6–20)
CO2: 18 mmol/L — ABNORMAL LOW (ref 22–32)
Calcium: 9 mg/dL (ref 8.9–10.3)
Chloride: 106 mmol/L (ref 98–111)
Creatinine, Ser: 0.89 mg/dL (ref 0.44–1.00)
GFR calc Af Amer: >60 mL/min (ref >60)
Glucose, Bld: 146 mg/dL — ABNORMAL HIGH (ref 70–99)
POTASSIUM: 4.2 mmol/L (ref 3.5–5.1)
Sodium: 136 mmol/L (ref 135–145)
TOTAL PROTEIN: 7.3 g/dL (ref 6.5–8.1)

## 2018-09-08 LAB — CBC WITH DIFFERENTIAL/PLATELET
Abs Immature Granulocytes: 0.04 10*3/uL (ref 0.00–0.07)
BASOS ABS: 0 10*3/uL (ref 0.0–0.1)
Basophils Relative: 0 %
EOS ABS: 0 10*3/uL (ref 0.0–0.5)
EOS PCT: 0 %
HEMATOCRIT: 40.2 % (ref 36.0–46.0)
Hemoglobin: 11.9 g/dL — ABNORMAL LOW (ref 12.0–15.0)
Immature Granulocytes: 0 %
LYMPHS ABS: 0.7 10*3/uL (ref 0.7–4.0)
Lymphocytes Relative: 6 %
MCH: 25.4 pg — ABNORMAL LOW (ref 26.0–34.0)
MCHC: 29.6 g/dL — AB (ref 30.0–36.0)
MCV: 85.9 fL (ref 80.0–100.0)
MONOS PCT: 1 %
Monocytes Absolute: 0.1 10*3/uL (ref 0.1–1.0)
NEUTROS PCT: 93 %
NRBC: 0 % (ref 0.0–0.2)
Neutro Abs: 9.9 10*3/uL — ABNORMAL HIGH (ref 1.7–7.7)
Platelets: 229 10*3/uL (ref 150–400)
RBC: 4.68 MIL/uL (ref 3.87–5.11)
RDW: 15.6 % — AB (ref 11.5–15.5)
WBC: 10.8 10*3/uL — AB (ref 4.0–10.5)

## 2018-09-08 LAB — TROPONIN I

## 2018-09-08 LAB — POCT PREGNANCY, URINE: Preg Test, Ur: NEGATIVE

## 2018-09-08 SURGERY — DENTAL RESTORATION/EXTRACTIONS
Anesthesia: General | Laterality: Bilateral

## 2018-09-08 MED ORDER — DILTIAZEM HCL ER COATED BEADS 120 MG PO CP24: 120.0000 mg | ORAL_CAPSULE | Freq: Every day | ORAL | Status: DC

## 2018-09-08 MED ORDER — OXYCODONE HCL 5 MG/5ML PO SOLN: 5.0000 mg | Freq: Once | ORAL | Status: DC | PRN

## 2018-09-08 MED ORDER — LACTATED RINGERS IV SOLN: 500.0000 mL | INTRAVENOUS | Status: DC

## 2018-09-08 MED ORDER — OXYCODONE HCL 5 MG PO TABS: 5.0000 mg | ORAL_TABLET | Freq: Once | ORAL | Status: DC | PRN

## 2018-09-08 MED ORDER — METOPROLOL TARTRATE 25 MG PO TABS
25.0000 mg | ORAL_TABLET | Freq: Once | ORAL | Status: AC
  Administered 2018-09-08: 25 mg via ORAL
  Filled 2018-09-08: qty 1

## 2018-09-08 MED ORDER — OXYCODONE-ACETAMINOPHEN 5-325 MG PO TABS: 1.0000 | ORAL_TABLET | ORAL | 0 refills | Status: DC | PRN

## 2018-09-08 MED ORDER — ONDANSETRON HCL 4 MG/2ML IJ SOLN
INTRAMUSCULAR | Status: AC
  Filled 2018-09-08: qty 2

## 2018-09-08 MED ORDER — HALOPERIDOL DECANOATE 100 MG/ML IM SOLN: 100.0000 mg | INTRAMUSCULAR | Status: DC

## 2018-09-08 MED ORDER — ONDANSETRON HCL 4 MG/2ML IJ SOLN: 4.0000 mg | Freq: Four times a day (QID) | INTRAMUSCULAR | Status: DC | PRN

## 2018-09-08 MED ORDER — FENTANYL CITRATE (PF) 100 MCG/2ML IJ SOLN
INTRAMUSCULAR | Status: AC
  Filled 2018-09-08: qty 2

## 2018-09-08 MED ORDER — DILTIAZEM HCL-DEXTROSE 100-5 MG/100ML-% IV SOLN (PREMIX)
5.0000 mg/h | INTRAVENOUS | Status: DC
  Administered 2018-09-08: 7.5 mg/h via INTRAVENOUS
  Administered 2018-09-08: 12.5 mg/h via INTRAVENOUS
  Filled 2018-09-08: qty 100

## 2018-09-08 MED ORDER — AMOXICILLIN 500 MG PO CAPS: 500.0000 mg | ORAL_CAPSULE | Freq: Three times a day (TID) | ORAL | 0 refills | Status: DC

## 2018-09-08 MED ORDER — FLUOXETINE HCL 20 MG PO CAPS
20.0000 mg | ORAL_CAPSULE | Freq: Every day | ORAL | Status: DC
  Administered 2018-09-08 – 2018-09-10 (×3): 20 mg via ORAL
  Filled 2018-09-08 (×3): qty 1

## 2018-09-08 MED ORDER — HYDROXYZINE HCL 25 MG PO TABS: 25.0000 mg | ORAL_TABLET | Freq: Two times a day (BID) | ORAL | Status: DC | PRN

## 2018-09-08 MED ORDER — ACETAMINOPHEN 325 MG PO TABS
650.0000 mg | ORAL_TABLET | ORAL | Status: DC | PRN
  Administered 2018-09-08 – 2018-09-10 (×3): 650 mg via ORAL
  Filled 2018-09-08 (×3): qty 2

## 2018-09-08 MED ORDER — SCOPOLAMINE 1 MG/3DAYS TD PT72: 1.0000 | MEDICATED_PATCH | Freq: Once | TRANSDERMAL | Status: DC | PRN

## 2018-09-08 MED ORDER — MELATONIN 3 MG PO TABS
4.5000 mg | ORAL_TABLET | Freq: Every day | ORAL | Status: DC
  Administered 2018-09-08 – 2018-09-09 (×2): 4.5 mg via SUBLINGUAL
  Filled 2018-09-08 (×3): qty 1.5

## 2018-09-08 MED ORDER — ONDANSETRON HCL 4 MG/2ML IJ SOLN
INTRAMUSCULAR | Status: DC | PRN
  Administered 2018-09-08: 4 mg via INTRAVENOUS

## 2018-09-08 MED ORDER — LIDOCAINE 2% (20 MG/ML) 5 ML SYRINGE
INTRAMUSCULAR | Status: DC | PRN
  Administered 2018-09-08: 100 mg via INTRAVENOUS

## 2018-09-08 MED ORDER — MIDAZOLAM HCL 2 MG/2ML IJ SOLN
INTRAMUSCULAR | Status: AC
  Filled 2018-09-08: qty 2

## 2018-09-08 MED ORDER — PROMETHAZINE HCL 25 MG/ML IJ SOLN: 6.2500 mg | INTRAMUSCULAR | Status: DC | PRN

## 2018-09-08 MED ORDER — MIDAZOLAM HCL 2 MG/2ML IJ SOLN
1.0000 mg | INTRAMUSCULAR | Status: DC | PRN
  Administered 2018-09-08: 2 mg via INTRAVENOUS

## 2018-09-08 MED ORDER — FENTANYL CITRATE (PF) 100 MCG/2ML IJ SOLN
50.0000 ug | Freq: Once | INTRAMUSCULAR | Status: AC
  Administered 2018-09-08: 50 ug via INTRAVENOUS
  Filled 2018-09-08: qty 2

## 2018-09-08 MED ORDER — PHENYLEPHRINE HCL 10 MG/ML IJ SOLN
INTRAMUSCULAR | Status: DC | PRN
  Administered 2018-09-08 (×2): 80 ug via INTRAVENOUS

## 2018-09-08 MED ORDER — DILTIAZEM LOAD VIA INFUSION
10.0000 mg | Freq: Once | INTRAVENOUS | Status: AC
  Administered 2018-09-08: 10 mg via INTRAVENOUS
  Filled 2018-09-08: qty 10

## 2018-09-08 MED ORDER — PROPOFOL 10 MG/ML IV BOLUS
INTRAVENOUS | Status: DC | PRN
  Administered 2018-09-08: 200 mg via INTRAVENOUS

## 2018-09-08 MED ORDER — MEPERIDINE HCL 25 MG/ML IJ SOLN: 6.2500 mg | INTRAMUSCULAR | Status: DC | PRN

## 2018-09-08 MED ORDER — DILTIAZEM HCL-DEXTROSE 100-5 MG/100ML-% IV SOLN (PREMIX)
5.0000 mg/h | INTRAVENOUS | Status: DC
  Administered 2018-09-08: 5 mg/h via INTRAVENOUS
  Filled 2018-09-08: qty 100

## 2018-09-08 MED ORDER — FENTANYL CITRATE (PF) 100 MCG/2ML IJ SOLN
50.0000 ug | INTRAMUSCULAR | Status: DC | PRN
  Administered 2018-09-08: 100 ug via INTRAVENOUS

## 2018-09-08 MED ORDER — METOPROLOL TARTRATE 25 MG PO TABS
25.0000 mg | ORAL_TABLET | Freq: Two times a day (BID) | ORAL | Status: DC
  Administered 2018-09-08 – 2018-09-10 (×4): 25 mg via ORAL
  Filled 2018-09-08 (×4): qty 1

## 2018-09-08 MED ORDER — MAGIC MOUTHWASH W/LIDOCAINE
15.0000 mL | Freq: Four times a day (QID) | ORAL | Status: DC | PRN
  Administered 2018-09-09 – 2018-09-10 (×2): 15 mL via ORAL
  Filled 2018-09-08 (×3): qty 15

## 2018-09-08 MED ORDER — SODIUM CHLORIDE 0.9 % IV BOLUS
1000.0000 mL | Freq: Once | INTRAVENOUS | Status: AC
  Administered 2018-09-08: 1000 mL via INTRAVENOUS

## 2018-09-08 MED ORDER — SUCCINYLCHOLINE CHLORIDE 20 MG/ML IJ SOLN
INTRAMUSCULAR | Status: DC | PRN
  Administered 2018-09-08: 100 mg via INTRAVENOUS

## 2018-09-08 MED ORDER — SODIUM CHLORIDE 0.9 % IV SOLN
INTRAVENOUS | Status: AC | PRN
  Administered 2018-09-08: 500 mL

## 2018-09-08 MED ORDER — LACTATED RINGERS IV SOLN
INTRAVENOUS | Status: DC
  Administered 2018-09-08: 10 mL/h via INTRAVENOUS
  Administered 2018-09-08: 13:00:00 via INTRAVENOUS

## 2018-09-08 MED ORDER — DEXAMETHASONE SODIUM PHOSPHATE 4 MG/ML IJ SOLN
INTRAMUSCULAR | Status: DC | PRN
  Administered 2018-09-08: 10 mg via INTRAVENOUS

## 2018-09-08 MED ORDER — CEFAZOLIN SODIUM-DEXTROSE 2-4 GM/100ML-% IV SOLN
2.0000 g | INTRAVENOUS | Status: AC
  Administered 2018-09-08: 2 g via INTRAVENOUS

## 2018-09-08 MED ORDER — FENTANYL CITRATE (PF) 100 MCG/2ML IJ SOLN
25.0000 ug | INTRAMUSCULAR | Status: DC | PRN
  Administered 2018-09-08: 50 ug via INTRAVENOUS

## 2018-09-08 MED ORDER — DEXAMETHASONE SODIUM PHOSPHATE 10 MG/ML IJ SOLN
INTRAMUSCULAR | Status: AC
  Filled 2018-09-08: qty 1

## 2018-09-08 MED ORDER — CEFAZOLIN SODIUM-DEXTROSE 2-4 GM/100ML-% IV SOLN
INTRAVENOUS | Status: AC
  Filled 2018-09-08: qty 100

## 2018-09-08 MED ORDER — HYDROXYZINE HCL 25 MG PO TABS
25.0000 mg | ORAL_TABLET | Freq: Two times a day (BID) | ORAL | Status: DC
  Administered 2018-09-08 – 2018-09-10 (×4): 25 mg via ORAL
  Filled 2018-09-08 (×4): qty 1

## 2018-09-08 SURGICAL SUPPLY — 40 items
BANDAGE COBAN STERILE 2 (GAUZE/BANDAGES/DRESSINGS) IMPLANT
BLADE SURG 15 STRL LF DISP TIS (BLADE) ×1 IMPLANT
BLADE SURG 15 STRL SS (BLADE) ×1
BNDG EYE OVAL (GAUZE/BANDAGES/DRESSINGS) ×2 IMPLANT
BUR CROSS CUT FISSURE 1.6 (BURR) ×1 IMPLANT
BUR EGG ELITE 4.0 (BURR) IMPLANT
CANISTER SUCT 1200ML W/VALVE (MISCELLANEOUS) ×2 IMPLANT
CATH ROBINSON RED A/P 10FR (CATHETERS) IMPLANT
COVER BACK TABLE 60X90IN (DRAPES) ×2 IMPLANT
COVER MAYO STAND STRL (DRAPES) ×2 IMPLANT
COVER WAND RF STERILE (DRAPES) IMPLANT
DECANTER SPIKE VIAL GLASS SM (MISCELLANEOUS) IMPLANT
DRAPE U-SHAPE 76X120 STRL (DRAPES) ×2 IMPLANT
GAUZE PACKING FOLDED 2  STR (GAUZE/BANDAGES/DRESSINGS) ×1
GAUZE PACKING FOLDED 2 STR (GAUZE/BANDAGES/DRESSINGS) ×1 IMPLANT
GAUZE PACKING IODOFORM 1/4X15 (GAUZE/BANDAGES/DRESSINGS) IMPLANT
GLOVE BIO SURGEON STRL SZ 6.5 (GLOVE) ×2 IMPLANT
GLOVE BIO SURGEON STRL SZ7.5 (GLOVE) ×2 IMPLANT
GLOVE BIOGEL PI IND STRL 6.5 (GLOVE) ×1 IMPLANT
GLOVE BIOGEL PI INDICATOR 6.5 (GLOVE) ×1
GOWN STRL REUS W/ TWL LRG LVL3 (GOWN DISPOSABLE) ×1 IMPLANT
GOWN STRL REUS W/ TWL XL LVL3 (GOWN DISPOSABLE) ×2 IMPLANT
GOWN STRL REUS W/TWL LRG LVL3 (GOWN DISPOSABLE) ×1
GOWN STRL REUS W/TWL XL LVL3 (GOWN DISPOSABLE) ×2
IV NS 500ML (IV SOLUTION) ×1
IV NS 500ML BAXH (IV SOLUTION) ×1 IMPLANT
NEEDLE HYPO 22GX1.5 SAFETY (NEEDLE) ×2 IMPLANT
NS IRRIG 1000ML POUR BTL (IV SOLUTION) ×2 IMPLANT
PACK BASIN DAY SURGERY FS (CUSTOM PROCEDURE TRAY) ×2 IMPLANT
SLEEVE SCD COMPRESS KNEE MED (MISCELLANEOUS) ×1 IMPLANT
SPONGE SURGIFOAM ABS GEL 12-7 (HEMOSTASIS) IMPLANT
SUT CHROMIC 3 0 PS 2 (SUTURE) ×4 IMPLANT
SYR BULB 3OZ (MISCELLANEOUS) ×2 IMPLANT
SYR CONTROL 10ML LL (SYRINGE) ×2 IMPLANT
TOOTHBRUSH ADULT (PERSONAL CARE ITEMS) IMPLANT
TOWEL GREEN STERILE FF (TOWEL DISPOSABLE) ×2 IMPLANT
TRAY DSU PREP LF (CUSTOM PROCEDURE TRAY) IMPLANT
TUBE CONNECTING 20X1/4 (TUBING) ×2 IMPLANT
TUBING IRRIGATION (MISCELLANEOUS) ×2 IMPLANT
YANKAUER SUCT BULB TIP NO VENT (SUCTIONS) ×2 IMPLANT

## 2018-09-08 NOTE — Progress Notes (Signed)
Dr. Bretta BangGermaroth made aware of pts a fib and BP and social history. Chart reviewed and possible need for ER visit discussed. Dr. Bretta BangGermaroth out to speak with family.

## 2018-09-08 NOTE — Transfer of Care (Signed)
Immediate Anesthesia Transfer of Care Note  Patient: Kathy Brown  Procedure(s) Performed: DENTAL RESTORATION/EXTRACTIONS (Bilateral )  Patient Location: PACU  Anesthesia Type:General  Level of Consciousness: sedated  Airway & Oxygen Therapy: Patient Spontanous Breathing and Patient connected to face mask oxygen  Post-op Assessment: Report given to RN and Post -op Vital signs reviewed and stable  Post vital signs: Reviewed and stable  Last Vitals:  Vitals Value Taken Time  BP 154/108 09/08/2018  1:00 PM  Temp    Pulse 79 09/08/2018  1:03 PM  Resp 9 09/08/2018  1:03 PM  SpO2 100 % 09/08/2018  1:03 PM  Vitals shown include unvalidated device data.  Last Pain:  Vitals:   09/08/18 1007  TempSrc: Oral  PainSc: 10-Worst pain ever      Patients Stated Pain Goal: 7 (09/08/18 1007)  Complications: No apparent anesthesia complications

## 2018-09-08 NOTE — Progress Notes (Signed)
Spoke with mother about attending next dr appointment with patient and discuss possible health care power of attorney for pt. Mother states pt usually goes to [facility] alone and [facility] assist her with her care. Encouraged mom to speak with her regarding pts care. Mother states she is busy with the patients three children but plans to get more involved with daughters care.

## 2018-09-08 NOTE — ED Provider Notes (Signed)
[facility]   CSN: [id number] Arrival date & time: 09/08/18  1454     History   Chief Complaint Chief Complaint  Patient presents with  . Atrial Fibrillation    HPI Kathy SarkLatasha Brown is a 37 y.o. female presenting due to atrial fibrillation with RVR after having all her teeth removed at the surgery day center. History was provided by patient and her mother. Patient reports constant sharp pain in the inside of her mouth, but denies any chest pain, shortness of breath, extremity edema, or palpitations. Patient was given Fentanyl at the surgery center. Patient has a history schizoaffective disorder, which her mother states she takes her psychiatric medications every day. Patient denies any visual or auditory hallucinations or delusions. Patient and mother both deny any heart issues in the past or taking any other medications, but patient was admitted on 10/14/2017 for afib with RVR and was prescribed Diltiazem and Metoprolol. Patient reports she has not been taking these medications. Patient has been seen in the ER multiple times over the last few months and EKG demonstrates persistent afib. Patient is confused at times when answering questions.    HPI  Past Medical History:  Diagnosis Date  . Anxiety   . Chronic back pain   . Dysrhythmia    Atrial fibrillation  . Schizophrenia (HCC)   . Urinary incontinence     Patient Active Problem List   Diagnosis Date Noted  . A-fib (HCC) 09/08/2018  . Atrial fibrillation with RVR (HCC) 10/15/2017  . Auditory hallucinations   . Paranoid schizophrenia (HCC)   . Schizoaffective disorder (HCC) 03/25/2014  . Psychosis (HCC) 03/25/2014    History reviewed. No pertinent surgical history.   OB History    Gravida      Para      Term      Preterm      AB      Living  3     SAB      TAB      Ectopic      Multiple      Live Births               Home Medications     Prior to Admission medications   Medication Sig Start Date End Date Taking? Authorizing Provider  FLUoxetine (PROZAC) 20 MG tablet Take 1 tablet (20 mg total) by mouth daily. 12/15/16  Yes Kirichenko, Tatyana, PA-C  haloperidol decanoate (HALDOL DECANOATE) 100 MG/ML injection Inject 1 mL into the muscle every 28 (twenty-eight) days. 06/26/18  Yes [provider]  hydrOXYzine (ATARAX/VISTARIL) 25 MG tablet Take 1 tablet (25 mg total) by mouth 2 (two) times daily as needed for anxiety. 11/25/16  Yes Garlon HatchetSanders, Lisa M, PA-C  Melatonin 5 MG TBDP Place 5 mg under the tongue at bedtime. 06/26/18  Yes [provider]  amoxicillin (AMOXIL) 500 MG capsule Take 1 capsule (500 mg total) by mouth 3 (three) times daily. 09/08/18   Ocie DoyneJensen, Scott, DDS  benztropine (COGENTIN) 0.5 MG tablet Take 1 tablet (0.5 mg total) by mouth 2 (two) times daily. Patient not taking: Reported on 03/02/2018 08/25/16   Hedges, Tinnie GensJeffrey, PA-C  diltiazem (CARDIZEM CD) 120 MG 24 hr capsule Take 1 capsule (120 mg total) by mouth daily. Patient not taking: Reported on 03/02/2018 10/18/17 10/18/18  Maxie BarbBhandari, Dron Prasad, MD  haloperidol (HALDOL) 2 MG tablet Take 1 tablet (2 mg total) by mouth at bedtime. Patient not taking: Reported on 03/02/2018  11/25/16   Garlon Hatchet, PA-C  hydrOXYzine (ATARAX/VISTARIL) 25 MG tablet Take 1 tablet (25 mg total) by mouth 2 (two) times daily. 12/15/16   Kirichenko, Tatyana, PA-C  metoprolol tartrate (LOPRESSOR) 25 MG tablet Take 1 tablet (25 mg total) by mouth 2 (two) times daily. Patient not taking: Reported on 03/02/2018 10/18/17   Maxie Barb, MD  oxyCODONE-acetaminophen (PERCOCET) 5-325 MG tablet Take 1 tablet by mouth every 4 (four) hours as needed. 09/08/18   Ocie Doyne, DDS  traZODone (DESYREL) 50 MG tablet Take 0.5 tablets (25 mg total) by mouth at bedtime as needed for sleep. Patient not taking: Reported on 03/02/2018 04/25/15   Toy Cookey, MD    Family History Family  History  Problem Relation Age of Onset  . Hypertension Mother   . Diabetes Mother     Social History Social History   Tobacco Use  . Smoking status: Never Smoker  . Smokeless tobacco: Never Used  Substance Use Topics  . Alcohol use: No  . Drug use: No     Allergies   Patient has no known allergies.   Review of Systems Review of Systems  Constitutional: Negative for activity change, appetite change, chills, diaphoresis, fatigue, fever and unexpected weight change.  HENT: Positive for dental problem and facial swelling. Negative for congestion and rhinorrhea.   Respiratory: Negative for cough, chest tightness and shortness of breath.   Cardiovascular: Negative for chest pain, palpitations and leg swelling.  Gastrointestinal: Negative for abdominal pain, nausea and vomiting.  Endocrine: Negative for cold intolerance and heat intolerance.  Musculoskeletal: Negative for back pain.  Skin: Negative for rash.  Allergic/Immunologic: Negative for immunocompromised state.  Neurological: Negative for dizziness, syncope, weakness and light-headedness.  Psychiatric/Behavioral: Negative for agitation and behavioral problems. The patient is not nervous/anxious.      Physical Exam Updated Vital Signs BP (!) 139/98   Pulse 83   Temp 97.8 F (36.6 C) (Axillary) Comment (Src): mouth surgery   Resp 16   Ht 5\' 5"  (1.651 m)   Wt 100.2 kg   LMP 08/27/2018 (Exact Date)   SpO2 93%   BMI 36.76 kg/m   Physical Exam  Constitutional: She is oriented to person, place, and time. She appears well-developed and well-nourished. No distress.  HENT:  Head: Normocephalic and atraumatic.  Facial swelling present due to recent surgery. All teeth have been removed in surgery. No bleeding noted.   Eyes: Pupils are equal, round, and reactive to light. Conjunctivae and EOM are normal.  Neck: Normal range of motion. Neck supple. No JVD present.  Cardiovascular: Regular rhythm, normal heart sounds and  normal pulses. Tachycardia present. Exam reveals no gallop and no friction rub.  No murmur heard. Pulses:      Radial pulses are 2+ on the right side, and 2+ on the left side.       Dorsalis pedis pulses are 2+ on the right side, and 2+ on the left side.  Pulmonary/Chest: Effort normal and breath sounds normal. No respiratory distress. She has no wheezes. She has no rales. She exhibits no tenderness.  Abdominal: Soft. There is no tenderness.  Musculoskeletal: Normal range of motion.  Neurological: She is alert and oriented to person, place, and time.  Skin: Skin is warm. Capillary refill takes less than 2 seconds. No rash noted. She is not diaphoretic. No pallor.  Psychiatric: She has a normal mood and affect.  Nursing note and vitals reviewed.    ED Treatments / Results  Labs (all labs ordered are listed, but only abnormal results are displayed) Labs Reviewed  CBC WITH DIFFERENTIAL/PLATELET - Abnormal; Notable for the following components:      Result Value   WBC 10.8 (*)    Hemoglobin 11.9 (*)    MCH 25.4 (*)    MCHC 29.6 (*)    RDW 15.6 (*)    Neutro Abs 9.9 (*)    All other components within normal limits  COMPREHENSIVE METABOLIC PANEL - Abnormal; Notable for the following components:   CO2 18 (*)    Glucose, Bld 146 (*)    All other components within normal limits  TROPONIN I  POCT PREGNANCY, URINE    EKG EKG Interpretation  Date/Time:  Wednesday September 08 2018 15:16:46 EST Ventricular Rate:  126 PR Interval:    QRS Duration: 85 QT Interval:  297 QTC Calculation: 430 R Axis:   92 Text Interpretation:  Atrial fibrillation Ventricular premature complex Borderline right axis deviation Borderline repolarization abnormality afib with RVR similar to prior EKG's Confirmed by Eber Hong ([id number]) on 09/08/2018 3:44:42 PM Also confirmed by Eber Hong ([id number]), editor Barbette Hair [phone])  on 09/08/2018 3:48:40 PM   Radiology Dg Chest Port 1 View  Result Date:  09/08/2018 CLINICAL DATA:  Atrial fibrillation of for oral surgery today. Ice packs about the face and jaw. EXAM: PORTABLE CHEST 1 VIEW COMPARISON:  10/15/2017 FINDINGS: Normal heart size and mediastinal contours. Subtle opacities at the left lung base may represent superimposition of the patient's left breast shadow upon the left lung base. A subtle pneumonia is not entirely excluded and clinical correlation is therefore recommended. No effusion or pulmonary edema. No acute osseous abnormality. IMPRESSION: Subtle opacity at the left lung base may reflect superimposition of the patient's left breast shadow. Pneumonia is not entirely excluded. Otherwise negative exam. Electronically Signed   By: Tollie Eth M.D.   On: 09/08/2018 16:03    Procedures Procedures (including critical care time)  Medications Ordered in ED Medications  diltiazem (CARDIZEM) 1 mg/mL load via infusion 10 mg (10 mg Intravenous Bolus from Bag 09/08/18 1604)    And  diltiazem (CARDIZEM) 100 mg in dextrose 5% (1 mg/mL) infusion (12.5 mg/hr Intravenous Rate/Dose Change 09/08/18 1728)  ceFAZolin (ANCEF) IVPB 2g/100 mL premix (2 g Intravenous Given 09/08/18 1141)  0.9 %  sodium chloride infusion (  Stopped 09/08/18 1523)  sodium chloride 0.9 % bolus 1,000 mL (0 mLs Intravenous Stopped 09/08/18 1731)  fentaNYL (SUBLIMAZE) injection 50 mcg (50 mcg Intravenous Given 09/08/18 1631)  metoprolol tartrate (LOPRESSOR) tablet 25 mg (25 mg Oral Given 09/08/18 1731)     Initial Impression / Assessment and Plan / ED Course  I have reviewed the triage vital signs and the nursing notes.  Pertinent labs & imaging results that were available during my care of the patient were reviewed by me and considered in my medical decision making (see chart for details).  Clinical Course as of Sep 08 1829  Wed Sep 08, 2018  1657 WBC likely elevated due to recent surgery.   CBC with Differential(!) [AH]  1739 Heart rate is improving with  Diltiazem and Metoprolol.   Pulse Rate(!): 101 [AH]    Clinical Course User Index [AH] Leretha Dykes, PA-C    Patient presents with complaint of atrial fibrillation. Patient nontoxic appearing, in no apparent distress.  Labs:  Ordered CBC and CMP. Elevated WBC at 10.8 and low hemoglobin at 11.9. Suspect results are likely due to recent  surgery.   Imaging: Ordered CXR. CXR is normal when compared to clinical picture.   Therapeutics: Provided diltiazem and metoprolol for rate control and IVF. Provided Fentanyl for pain management.  Consults: Consultant called back at 1640 and case was discussed with Dr. Hyacinth Meeker. Discharge instructions per Dr. Ocie Doyne include oral liquids can be given today and soft diet may be started tomorrow. Dr. Barbette Merino reports he has sent antibiotics to her pharmacy.   Assessment/Plan: Suspect persistent atrial fibrillation due to previous EKGs and history. Patient is not a candidate for anticoagulation due to her recent surgery. Patient has been provided Diltiazem and Metoprolol for rate control, but patient is still tachycardic. Patient will need to be admitted for further rate control management. Dr. Hyacinth Meeker discussed case with hospitalist. Hospitalist has agreed to admit patient.   Findings and plan of care discussed with supervising physician Dr. Hyacinth Meeker who personally evaluated and examined this patient.   Final Clinical Impressions(s) / ED Diagnoses   Final diagnoses:  Atrial fibrillation with RVR [facility])    ED Discharge Orders         Ordered    Increase activity slowly     09/08/18 1247    Diet - low sodium heart healthy    Comments:  Soft Diet. Advance as tolerated.   09/08/18 1247    Call MD for:  temperature >100.4     09/08/18 1247    Call MD for:  persistant nausea and vomiting     09/08/18 1247    Call MD for:  severe uncontrolled pain     09/08/18 1247    Call MD for:  difficulty breathing, headache or visual disturbances     09/08/18  1247    Discharge instructions    Comments:  Ice to affected area for 2-3 days. Warm salt water mouth rinses 4-5 times per day starting the day after surgery. Soft diet, advance as tolerated. No smoking for 2 weeks. Follow-up visit with Dr. Barbette Merino as scheduled. Call [phone] for problems.   09/08/18 1247    oxyCODONE-acetaminophen (PERCOCET) 5-325 MG tablet  Every 4 hours PRN     09/08/18 1247    amoxicillin (AMOXIL) 500 MG capsule  3 times daily     09/08/18 1247           Carlyle Basques [place], [place] 09/08/18 1831    Eber Hong, MD 09/08/18 2318

## 2018-09-08 NOTE — Progress Notes (Signed)
Discharge instructions given to mother with pt belongings.

## 2018-09-08 NOTE — ED Notes (Signed)
Pt placed on 2L  for drowsiness and sats in the upper 80's after fentanyl, will continue to monitor.

## 2018-09-08 NOTE — ED Notes (Signed)
New ice packs applied to face.

## 2018-09-08 NOTE — Discharge Instructions (Addendum)
Post Anesthesia Home Care Instructions  Activity: Get plenty of rest for the remainder of the day. A responsible individual must stay with you for 24 hours following the procedure.  For the next 24 hours, DO NOT: -Drive a car -Advertising copywriterperate machinery -Drink alcoholic beverages -Take any medication unless instructed by your physician -Make any legal decisions or sign important papers.  Meals: Start with liquid foods such as gelatin or soup. Progress to regular foods as tolerated. Avoid greasy, spicy, heavy foods. If nausea and/or vomiting occur, drink only clear liquids until the nausea and/or vomiting subsides. Call your physician if vomiting continues.  Special Instructions/Symptoms: Your throat may feel dry or sore from the anesthesia or the breathing tube placed in your throat during surgery. If this causes discomfort, gargle with warm salt water. The discomfort should disappear within 24 hours.  If you had a scopolamine patch placed behind your ear for the management of post- operative nausea and/or vomiting:  1. The medication in the patch is effective for 72 hours, after which it should be removed.  Wrap patch in a tissue and discard in the trash. Wash hands thoroughly with soap and water. 2. You may remove the patch earlier than 72 hours if you experience unpleasant side effects which may include dry mouth, dizziness or visual disturbances. 3. Avoid touching the patch. Wash your hands with soap and water after contact with the patch.     Dental Extraction, Care After These instructions give you information about caring for yourself after your procedure. Your doctor may also give you more specific instructions. Call your doctor if you have any problems or questions after your procedure. Follow these instructions at home: Lifestyle  Protect the area where your tooth was removed. Do this even if you do not have any pain.  Do not smoke, do not spit, and do not drink through a straw  until your doctor says it is okay.  Eat soft foods as told by your doctor. Avoid hot drinks and spicy foods until your gum has healed. Cut (Incision) Care  Follow instructions from your doctor about: ? How to take care of the cut that was made in your gum. ? When and how to change gauze. ? When and how to take gauze out of your mouth. ? Having your stitches (sutures) removed.  Do not chew on the gauze.  If your gum is bleeding a lot, fold a clean piece of gauze, place it on the bleeding gum, and bite on it as told by your doctor. General instructions  Take medicines only as told by your doctor.  Do not rinse your mouth until your doctor says it is okay. Once you can rinse your mouth, it is important to rinse very gently. ? You may rinse your mouth with warm salt water once your doctor says it is okay. You can make a salt rinse by mixing one teaspoon of salt in two cups of warm water. Do this as told by your doctor.  Do not brush or floss near where your tooth was removed until your doctor says it is okay. You may brush your other teeth.  If directed, apply ice to your cheek on the side of your mouth where your tooth was removed: ? Put ice in a plastic bag. ? Place a towel between your skin and the bag. ? Leave the ice on for 20 minutes, 2-3 times a day.  Keep all follow-up visits as told by your doctor. This is important.  Contact a doctor if:  Your medicine is not helping your pain.  You have a fever and you also have any of these: ? A sick feeling in your stomach (nausea). ? Throwing up (vomiting). ? Chills.  You have a very bad cough.  You are short of breath. Get help right away if:  You have a lot of bleeding that does not stop.  You have more puffiness (swelling).  You have very bad pain.  You have fluid, blood, or pus coming from the gum where your tooth was removed.  You have trouble swallowing.  You cannot open your mouth. This information is not intended  to replace advice given to you by your health care provider. Make sure you discuss any questions you have with your health care provider. Document Released: 04/02/2010 Document Revised: 03/20/2016 Document Reviewed: 10/09/2014 Elsevier Interactive Patient Education  2018 [employer].  Soft-Food Meal Plan A soft-food meal plan includes foods that are safe and easy to swallow. This meal plan typically is used:  If you are having trouble chewing or swallowing foods.  As a transition meal plan after only having had liquid meals for a long period.  What do I need to know about the soft-food meal plan? A soft-food meal plan includes tender foods that are soft and easy to chew and swallow. In most cases, bite-sized pieces of food are easier to swallow. A bite-sized piece is about  inch or smaller. Foods in this plan do not need to be ground or pureed. Foods that are very hard, crunchy, or sticky should be avoided. Also, breads, cereals, yogurts, and desserts with nuts, seeds, or fruits should be avoided. What foods can I eat? Grains Rice and wild rice. Moist bread, dressing, pasta, and noodles. Well-moistened dry or cooked cereals, such as farina (cooked wheat cereal), oatmeal, or grits. Biscuits, breads, muffins, pancakes, and waffles that have been well moistened. Vegetables Shredded lettuce. Cooked, tender vegetables, including potatoes without skins. Vegetable juices. Broths or creamed soups made with vegetables that are not stringy or chewy. Strained tomatoes (without seeds). Fruits Canned or well-cooked fruits. Soft (ripe), peeled fresh fruits, such as peaches, nectarines, kiwi, cantaloupe, honeydew melon, and watermelon (without seeds). Soft berries with small seeds, such as strawberries. Fruit juices (without pulp). Meats and Other Protein Sources Moist, tender, lean beef. Mutton. Lamb. Veal. Chicken. [place]. Liver. Ham. Fish without bones. Eggs. Dairy Milk, milk drinks, and cream. Plain  cream cheese and cottage cheese. Plain yogurt. Sweets/Desserts Flavored gelatin desserts. Custard. Plain ice cream, frozen yogurt, sherbet, milk shakes, and malts. Plain cakes and cookies. Plain hard candy. Other Butter, margarine (without trans fat), and cooking oils. Mayonnaise. Cream sauces. Mild spices, salt, and sugar. Syrup, molasses, honey, and jelly. The items listed above may not be a complete list of recommended foods or beverages. Contact your dietitian for more options. What foods are not recommended? Grains Dry bread, toast, crackers that have not been moistened. Coarse or dry cereals, such as bran, granola, and shredded wheat. Tough or chewy crusty breads, such as [place] bread or baguettes. Vegetables Corn. Raw vegetables except shredded lettuce. Cooked vegetables that are tough or stringy. Tough, crisp, fried potatoes and potato skins. Fruits Fresh fruits with skins or seeds or both, such as apples, pears, or grapes. Stringy, high-pulp fruits, such as papaya, pineapple, coconut, or mango. Fruit leather, fruit roll-ups, and all dried fruits. Meats and Other Protein Sources Sausages and hot dogs. Meats with gristle. Fish with bones. Nuts, seeds, and  chunky peanut or other nut butters. Sweets/Desserts Cakes or cookies that are very dry or chewy. The items listed above may not be a complete list of foods and beverages to avoid. Contact your dietitian for more information. This information is not intended to replace advice given to you by your health care provider. Make sure you discuss any questions you have with your health care provider. Document Released: 01/20/2008 Document Revised: 03/20/2016 Document Reviewed: 09/09/2013 Elsevier Interactive Patient Education  2017 Elsevier Inc.    Atrial Fibrillation Atrial fibrillation is a type of heartbeat that is irregular or fast (rapid). If you have this condition, your heart keeps quivering in a weird (chaotic) way. This condition  can make it so your heart cannot pump blood normally. Having this condition gives a person more risk for stroke, heart failure, and other heart problems. There are different types of atrial fibrillation. Talk with your doctor to learn about the type that you have. Follow these instructions at home:  Take over-the-counter and prescription medicines only as told by your doctor.  If your doctor prescribed a blood-thinning medicine, take it exactly as told. Taking too much of it can cause bleeding. If you do not take enough of it, you will not have the protection that you need against stroke and other problems.  Do not use any tobacco products. These include cigarettes, chewing tobacco, and e-cigarettes. If you need help quitting, ask your doctor.  If you have apnea (obstructive sleep apnea), manage it as told by your doctor.  Do not drink alcohol.  Do not drink beverages that have caffeine. These include coffee, soda, and tea.  Maintain a healthy weight. Do not use diet pills unless your doctor says they are safe for you. Diet pills may make heart problems worse.  Follow diet instructions as told by your doctor.  Exercise regularly as told by your doctor.  Keep all follow-up visits as told by your doctor. This is important. Contact a doctor if:  You notice a change in the speed, rhythm, or strength of your heartbeat.  You are taking a blood-thinning medicine and you notice more bruising.  You get tired more easily when you move or exercise. Get help right away if:  You have pain in your chest or your belly (abdomen).  You have sweating or weakness.  You feel sick to your stomach (nauseous).  You notice blood in your throw up (vomit), poop (stool), or pee (urine).  You are short of breath.  You suddenly have swollen feet and ankles.  You feel dizzy.  Your suddenly get weak or numb in your face, arms, or legs, especially if it happens on one side of your body.  You have  trouble talking, trouble understanding, or both.  Your face or your eyelid droops on one side. These symptoms may be an emergency. Do not wait to see if the symptoms will go away. Get medical help right away. Call your local emergency services (911 in the [place] Do not drive yourself to the hospital. This information is not intended to replace advice given to you by your health care provider. Make sure you discuss any questions you have with your health care provider. Document Released: 07/22/2008 Document Revised: 03/20/2016 Document Reviewed: 02/07/2015 Elsevier Interactive Patient Education  [employer].

## 2018-09-08 NOTE — Op Note (Signed)
09/08/2018  12:39 PM  PATIENT:  Kathy Brown  37 y.o. female  PRE-OPERATIVE DIAGNOSIS:  NON-RESTORABLE TEETH # 2, 3, 4, 5, 6, 7, 8, 9, 10, 11, 12, 13, 14, 15, 18, 19, 20, 21, 22, 23, 24, 25, 26, 27, 28, 29, 30, 31 POST-OPERATIVE DIAGNOSIS:  SAME  PROCEDURE:  Procedure(s): EXTRACTIONTEETH # 2, 3, 4, 5, 6, 7, 8, 9, 10, 11, 12, 13, 14, 15, 18, 19, 20, 21, 22, 23, 24, 25, 26, 27, 28, 29, 30, 31; ALVEOLOPLASTY RIGHT AND LEFT MAXILLA AND MANDIBLE    SURGEON:  Surgeon(s): Ocie DoyneJensen, Citlali Gautney, DDS  ANESTHESIA:   local and general  EBL:  minimal  DRAINS: none   SPECIMEN:  No Specimen  COUNTS:  YES  PLAN OF CARE: Discharge to home after PACU  PATIENT DISPOSITION:  PACU - hemodynamically stable.   PROCEDURE DETAILS: Dictation # [id number]  Georgia LopesScott M. Lionardo Haze, DMD 09/08/2018 12:39 PM

## 2018-09-08 NOTE — Anesthesia Preprocedure Evaluation (Addendum)
Anesthesia Evaluation  Patient identified by MRN, date of birth, ID band Patient awake    Reviewed: Allergy & Precautions, NPO status , Patient's Chart, lab work & pertinent test results  Airway Mallampati: II  TM Distance: >3 FB Neck ROM: Full    Dental no notable dental hx.    Pulmonary neg pulmonary ROS,    Pulmonary exam normal breath sounds clear to auscultation       Cardiovascular + dysrhythmias Atrial Fibrillation  Rhythm:Irregular Rate:Normal     Neuro/Psych negative neurological ROS  negative psych ROS   GI/Hepatic negative GI ROS, Neg liver ROS,   Endo/Other  negative endocrine ROS  Renal/GU negative Renal ROS     Musculoskeletal negative musculoskeletal ROS (+)   Abdominal (+) + obese,   Peds  Hematology negative hematology ROS (+)   Anesthesia Other Findings   Reproductive/Obstetrics negative OB ROS                            Anesthesia Physical Anesthesia Plan  ASA: III  Anesthesia Plan: General   Post-op Pain Management:    Induction: Intravenous  PONV Risk Score and Plan: 0 and 1 and Ondansetron, Dexamethasone, Midazolam and Scopolamine patch - Pre-op  Airway Management Planned: Nasal ETT  Additional Equipment: None  Intra-op Plan:   Post-operative Plan: Extubation in OR  Informed Consent: I have reviewed the patients History and Physical, chart, labs and discussed the procedure including the risks, benefits and alternatives for the proposed anesthesia with the patient or authorized representative who has indicated his/her understanding and acceptance.   Dental advisory given  Plan Discussed with: CRNA  Anesthesia Plan Comments:        Anesthesia Quick Evaluation

## 2018-09-08 NOTE — H&P (Signed)
H&P documentation  -History and Physical Reviewed  -Patient has been re-examined  -No change in the plan of care  Kathy Brown  

## 2018-09-08 NOTE — Progress Notes (Signed)
Dr. Bretta BangGermaroth by to see pt, will transfer pt to ED via [facility] for assessment of a fib and hypertension

## 2018-09-08 NOTE — ED Triage Notes (Signed)
Pt here via [facility] from surgical center, pt had all of her teeth removed there. Icepacks noted to outside of mouth, no gauze present.  No complications from anesthesia, pt in Afib RVR up to 170's in the PACU.  Pt given 50 mcg of Fentanyl in PACU.  Pt has not been taking her metorpolol, cardizem or Haldol at home.

## 2018-09-08 NOTE — ED Notes (Signed)
Pt given apple sauce

## 2018-09-08 NOTE — H&P (Signed)
History and Physical   Kathy Brown ZOX:[id number] DOB: Mar 06, 1981 DOA: 09/08/2018  Referring MD/NP/PA: Dr. Hyacinth Meeker  PCP: Arsenio Loader, [facility]   Outpatient Specialists: Ledora Bottcher, cardiology  Patient coming from: Home  Chief Complaint: Palpitations  HPI: Kathy Brown is a 37 y.o. female with medical history significant of schizoaffective disorder, atrial fibrillation, dental disease, anxiety disorder, urinary incontinence who has been noncompliant with her medications regarding her A. fib.  She was at her dentist today where she had all her teeth pulled with.  Subsequently had rapid ventricular response to her atrial fibrillation.  Patient came to the ER with heart rate in the 170s.  She is still having some pain in her mouth.  She was found to be in A. fib with RVR.  No fever or chills.  No cough.  Patient is subsequently being admitted for work-up of A. fib with RVR.  She denied any chest pain.  She has been initiated on Cardizem drip..  ED Course: Initial temperature 98.5 blood pressure 179/1 718 pulse 170 respiratory of 28 oxygen sat 89% on room air.  White count is 10.8 with hemoglobin 11.9 and platelet 229.  CO2 of 18 BUN 7 creatinine 0.89 glucose 146.  Chest x-ray showed some subtle opacity in the left lung base which could be an artifact.  No evidence of pneumonia clinically.  EKG showed A. fib with a rate of 126.  Patient has been started on Cardizem drip and being admitted for treatment.  Review of Systems: As per HPI otherwise 10 point review of systems negative.    Past Medical History:  Diagnosis Date  . Anxiety   . Chronic back pain   . Dysrhythmia    Atrial fibrillation  . Schizophrenia (HCC)   . Urinary incontinence     History reviewed. No pertinent surgical history.   reports that she has never smoked. She has never used smokeless tobacco. She reports that she does not drink alcohol or use drugs.  No Known Allergies  Family History  Problem  Relation Age of Onset  . Hypertension Mother   . Diabetes Mother      Prior to Admission medications   Medication Sig Start Date End Date Taking? Authorizing Provider  FLUoxetine (PROZAC) 20 MG tablet Take 1 tablet (20 mg total) by mouth daily. 12/15/16  Yes Kirichenko, Tatyana, PA-C  haloperidol decanoate (HALDOL DECANOATE) 100 MG/ML injection Inject 1 mL into the muscle every 28 (twenty-eight) days. 06/26/18  Yes [provider]  hydrOXYzine (ATARAX/VISTARIL) 25 MG tablet Take 1 tablet (25 mg total) by mouth 2 (two) times daily as needed for anxiety. 11/25/16  Yes Garlon Hatchet, PA-C  Melatonin 5 MG TBDP Place 5 mg under the tongue at bedtime. 06/26/18  Yes [provider]  amoxicillin (AMOXIL) 500 MG capsule Take 1 capsule (500 mg total) by mouth 3 (three) times daily. 09/08/18   Ocie Doyne, DDS  benztropine (COGENTIN) 0.5 MG tablet Take 1 tablet (0.5 mg total) by mouth 2 (two) times daily. Patient not taking: Reported on 03/02/2018 08/25/16   Hedges, Tinnie Gens, PA-C  diltiazem (CARDIZEM CD) 120 MG 24 hr capsule Take 1 capsule (120 mg total) by mouth daily. Patient not taking: Reported on 03/02/2018 10/18/17 10/18/18  Maxie Barb, MD  haloperidol (HALDOL) 2 MG tablet Take 1 tablet (2 mg total) by mouth at bedtime. Patient not taking: Reported on 03/02/2018 11/25/16   Garlon Hatchet, PA-C  hydrOXYzine (ATARAX/VISTARIL) 25 MG tablet Take 1 tablet (  25 mg total) by mouth 2 (two) times daily. 12/15/16   Kirichenko, Tatyana, PA-C  metoprolol tartrate (LOPRESSOR) 25 MG tablet Take 1 tablet (25 mg total) by mouth 2 (two) times daily. Patient not taking: Reported on 03/02/2018 10/18/17   Maxie Barb, MD  oxyCODONE-acetaminophen (PERCOCET) 5-325 MG tablet Take 1 tablet by mouth every 4 (four) hours as needed. 09/08/18   Ocie Doyne, DDS  traZODone (DESYREL) 50 MG tablet Take 0.5 tablets (25 mg total) by mouth at bedtime as needed for sleep. Patient not taking: Reported  on 03/02/2018 04/25/15   Toy Cookey, MD    Physical Exam: Vitals:   09/08/18 1710 09/08/18 1711 09/08/18 1716 09/08/18 1745  BP:   (!) 161/112 (!) 139/98  Pulse: 77 96 (!) 101 83  Resp:    16  Temp:      TempSrc:      SpO2:   100% 93%  Weight:      Height:          Constitutional: NAD, calm, comfortable, very anxious Vitals:   09/08/18 1710 09/08/18 1711 09/08/18 1716 09/08/18 1745  BP:   (!) 161/112 (!) 139/98  Pulse: 77 96 (!) 101 83  Resp:    16  Temp:      TempSrc:      SpO2:   100% 93%  Weight:      Height:       Eyes: PERRL, lids and conjunctivae normal ENMT: Mucous membranes are moist. Posterior pharynx clear of any exudate or lesions.Normal dentition.  Neck: normal, supple, no masses, no thyromegaly Respiratory: clear to auscultation bilaterally, no wheezing, no crackles. Normal respiratory effort. No accessory muscle use.  Cardiovascular: Irregularly irregular rhythm, no murmurs / rubs / gallops. No extremity edema. 2+ pedal pulses. No carotid bruits.  Abdomen: no tenderness, no masses palpated. No hepatosplenomegaly. Bowel sounds positive.  Musculoskeletal: no clubbing / cyanosis. No joint deformity upper and lower extremities. Good ROM, no contractures. Normal muscle tone.  Skin: no rashes, lesions, ulcers. No induration Neurologic: CN 2-12 grossly intact. Sensation intact, DTR normal. Strength 5/5 in all 4.  Psychiatric: Patient has normal mood, just anxious   Labs on Admission: I have personally reviewed following labs and imaging studies  CBC: Recent Labs  Lab 09/08/18 1602  WBC 10.8*  NEUTROABS 9.9*  HGB 11.9*  HCT 40.2  MCV 85.9  PLT 229   Basic Metabolic Panel: Recent Labs  Lab 09/08/18 1602  NA 136  K 4.2  CL 106  CO2 18*  GLUCOSE 146*  BUN 7  CREATININE 0.89  CALCIUM 9.0   GFR: Estimated Creatinine Clearance: 103.5 mL/min (by C-G formula based on SCr of 0.89 mg/dL). Liver Function Tests: Recent Labs  Lab 09/08/18 1602  AST  30  ALT 11  ALKPHOS 48  BILITOT 0.7  PROT 7.3  ALBUMIN 3.7   No results for input(s): LIPASE, AMYLASE in the last 168 hours. No results for input(s): AMMONIA in the last 168 hours. Coagulation Profile: No results for input(s): INR, PROTIME in the last 168 hours. Cardiac Enzymes: Recent Labs  Lab 09/08/18 1602  TROPONINI <0.03   BNP (last 3 results) No results for input(s): PROBNP in the last 8760 hours. HbA1C: No results for input(s): HGBA1C in the last 72 hours. CBG: No results for input(s): GLUCAP in the last 168 hours. Lipid Profile: No results for input(s): CHOL, HDL, LDLCALC, TRIG, CHOLHDL, LDLDIRECT in the last 72 hours. Thyroid Function Tests: No results for input(s):  TSH, T4TOTAL, FREET4, T3FREE, THYROIDAB in the last 72 hours. Anemia Panel: No results for input(s): VITAMINB12, FOLATE, FERRITIN, TIBC, IRON, RETICCTPCT in the last 72 hours. Urine analysis:    Component Value Date/Time   COLORURINE YELLOW 07/09/2018 1509   APPEARANCEUR CLEAR 07/09/2018 1509   LABSPEC 1.008 07/09/2018 1509   PHURINE 5.0 07/09/2018 1509   GLUCOSEU NEGATIVE 07/09/2018 1509   HGBUR NEGATIVE 07/09/2018 1509   BILIRUBINUR NEGATIVE 07/09/2018 1509   KETONESUR NEGATIVE 07/09/2018 1509   PROTEINUR NEGATIVE 07/09/2018 1509   UROBILINOGEN 0.2 09/20/2014 1135   NITRITE NEGATIVE 07/09/2018 1509   LEUKOCYTESUR NEGATIVE 07/09/2018 1509   Sepsis Labs: @LABRCNTIP (procalcitonin:4,lacticidven:4) )No results found for this or any previous visit (from the past 240 hour(s)).   Radiological Exams on Admission: Dg Chest Port 1 View  Result Date: 09/08/2018 CLINICAL DATA:  Atrial fibrillation of for oral surgery today. Ice packs about the face and jaw. EXAM: PORTABLE CHEST 1 VIEW COMPARISON:  10/15/2017 FINDINGS: Normal heart size and mediastinal contours. Subtle opacities at the left lung base may represent superimposition of the patient's left breast shadow upon the left lung base. A subtle  pneumonia is not entirely excluded and clinical correlation is therefore recommended. No effusion or pulmonary edema. No acute osseous abnormality. IMPRESSION: Subtle opacity at the left lung base may reflect superimposition of the patient's left breast shadow. Pneumonia is not entirely excluded. Otherwise negative exam. Electronically Signed   By: Tollie Ethavid  Kwon M.D.   On: 09/08/2018 16:03    EKG: Independently reviewed.  Shows atrial fibrillation with a rate of 126, no other significant abnormalities.  Assessment/Plan Active Problems:   A-fib (HCC)     #1 atrial fibrillation with rapid ventricular response: Patient currently on Cardizem drip.  She has been noncompliant with home regimen including Cardizem and metoprolol.  We will continue with the drip and admit to stepdown.  Restart her home regimen and adjust oral medications until she is titrated off the drip.  Counseling given to patient.  Mid to be an echocardiogram.  Will consider cardiology consultation.  Patient has previously been ruled out of anticoagulation due to her psychiatric issues which would make her noncompliant.  #2 schizoaffective disorder: Continue with home regimen as tolerated.  #3 dental disease: Patient's teeth were pulled completely today.  Recommendation is for her to use salt water mouthwash at least 5 times a day.  Other recommendations from dentistry will be followed while in the hospital.   DVT prophylaxis: Lovenox Code Status: Full code Family Communication: No family currently available Disposition Plan: Most likely home Consults called: None at the moment but cardiology in the morning Admission status: Inpatient   Severity of Illness: The appropriate patient status for this patient is INPATIENT. Inpatient status is judged to be reasonable and necessary in order to provide the required intensity of service to ensure the patient's safety. The patient's presenting symptoms, physical exam findings, and initial  radiographic and laboratory data in the context of their chronic comorbidities is felt to place them at high risk for further clinical deterioration. Furthermore, it is not anticipated that the patient will be medically stable for discharge from the hospital within 2 midnights of admission. The following factors support the patient status of inpatient.   " The patient's presenting symptoms include palpitations and shortness of breath. " The worrisome physical exam findings include atrial fibrillation with rapid response. " The initial radiographic and laboratory data are worrisome because of EKG showing A. fib with RVR. " The  chronic co-morbidities include known history of A. fib with psychiatric illness.   * I certify that at the point of admission it is my clinical judgment that the patient will require inpatient hospital care spanning beyond 2 midnights from the point of admission due to high intensity of service, high risk for further deterioration and high frequency of surveillance required.Lonia Blood MD [facility] Pager [phone]  If 7PM-7AM, please contact night-coverage [email or web address] Password [facility]  09/08/2018, 6:43 PM

## 2018-09-08 NOTE — ED Notes (Signed)
PA Hernandez at bedside, aware of afib RVR rate into 140's.  Will continue to monitor.

## 2018-09-08 NOTE — ED Notes (Signed)
Mother at bedside.

## 2018-09-08 NOTE — ED Provider Notes (Signed)
Medical screening examination/treatment/procedure(s) were conducted as a shared visit with non-physician practitioner(s) and myself.  I personally evaluated the patient during the encounter.  Clinical Impression:   Final diagnoses:  Atrial fibrillation with RVR [facility](HCC)    The patient is a 37 year old female, she has a known history of schizoaffective disorder, she also has a history of severe dental disease and today she underwent extraction of all of her remaining teeth at the surgical center by Dr. Barbette MerinoJensen.  Shortly after the procedure while she was in the recovery area she was noted to be in atrial fibrillation with a rapid rate in the 170s, variable and was sent to the emergency department for this reason.  The patient denies any specific complaints including chest pain or shortness of breath, she states that she has mouth pain and on my inspection the patient does not fact have multiple teeth that have been removed, only gums are left, she does have a small amount of blood and blood clot in the mouth but is tolerating her secretions.  She has a rapid heart rate at approximately 150 bpm but very variable, she has normal pulses at the radial arteries, she does not appear to have any respiratory distress and has clear lungs.  No edema of the legs.  Cardiac monitoring instituted, EKG shows rapid A. fib, the patient will need to have rate control, she may need to be admitted if this cannot be controlled in the emergency department.  We will need to discuss with dental to follow-up precautions and discharge instructions since she did not get those after she was found to be in A. fib.  There is a family member at the bedside who helps control the patient's medications however she goes to the doctor's office from the outpatient community center that she goes to, they take her to [facility] at urgent care when she needs to.  She has not been taking her medications for her paroxysmal atrial fibrillation for which she  was admitted in December 2018 with a new diagnosis.  She was not anticoagulated at that time due to her underlying psychiatric disorder it was determined that that was not appropriate per the cardiology notes.  The patient required an ongoing diltiazem drip, the patient did not have much improvement in the heart rate and is still in the 100-115 or 20 range.  I discussed with the hospitalist Dr. Mikeal HawthorneGarba who has been kind enough to come admit the patient.   EKG Interpretation  Date/Time:  Wednesday September 08 2018 15:16:46 EST Ventricular Rate:  126 PR Interval:    QRS Duration: 85 QT Interval:  297 QTC Calculation: 430 R Axis:   92 Text Interpretation:  Atrial fibrillation Ventricular premature complex Borderline right axis deviation Borderline repolarization abnormality afib with RVR similar to prior EKG's Confirmed by Eber HongMiller, Caysie Minnifield ([id number]) on 09/08/2018 3:44:42 PM Also confirmed by Eber HongMiller, Klara Stjames ([id number]), editor Barbette Hairassel, Kerry [phone])  on 09/08/2018 3:48:40 PM      .Critical Care Performed by: Eber HongMiller, Charlisha Market, MD Authorized by: Eber HongMiller, Sourish Allender, MD   Critical care provider statement:    Critical care time (minutes):  35   Critical care time was exclusive of:  Separately billable procedures and treating other patients and teaching time   Critical care was necessary to treat or prevent imminent or life-threatening deterioration of the following conditions:  Cardiac failure   Critical care was time spent personally by me on the following activities:  Blood draw for specimens, development of treatment plan with  patient or surrogate, discussions with consultants, evaluation of patient's response to treatment, examination of patient, obtaining history from patient or surrogate, ordering and performing treatments and interventions, ordering and review of laboratory studies, ordering and review of radiographic studies, pulse oximetry, re-evaluation of patient's condition and review of old charts       Eber Hong, MD 09/08/18 2319

## 2018-09-08 NOTE — ED Notes (Signed)
Attempted to call report to Judeth CornfieldStephanie, was told the nurse is still in report, will attempt again later.

## 2018-09-08 NOTE — Anesthesia Procedure Notes (Signed)
Procedure Name: Intubation Date/Time: 09/08/2018 11:40 AM Performed by: Maryella Shivers, CRNA Pre-anesthesia Checklist: Patient identified, Emergency Drugs available, Suction available and Patient being monitored Patient Re-evaluated:Patient Re-evaluated prior to induction Oxygen Delivery Method: Circle system utilized Preoxygenation: Pre-oxygenation with 100% oxygen Induction Type: IV induction Ventilation: Mask ventilation without difficulty Laryngoscope Size: Mac and 3 Grade View: Grade I Nasal Tubes: Nasal prep performed, Nasal Rae and Right Tube size: 6.5 mm Placement Confirmation: ETT inserted through vocal cords under direct vision,  positive ETCO2 and breath sounds checked- equal and bilateral Secured at: 24 cm Tube secured with: Tape Dental Injury: Teeth and Oropharynx as per pre-operative assessment

## 2018-09-08 NOTE — ED Notes (Signed)
MD Mikeal HawthorneGarba paged about needing a different diet request due to pt not having any teeth post surgery.

## 2018-09-09 ENCOUNTER — Inpatient Hospital Stay (HOSPITAL_COMMUNITY): Payer: Medicare Other

## 2018-09-09 ENCOUNTER — Encounter (HOSPITAL_BASED_OUTPATIENT_CLINIC_OR_DEPARTMENT_OTHER): Payer: Self-pay | Admitting: Oral Surgery

## 2018-09-09 DIAGNOSIS — I4891 Unspecified atrial fibrillation: Secondary | ICD-10-CM

## 2018-09-09 LAB — COMPREHENSIVE METABOLIC PANEL
ALK PHOS: 47 U/L (ref 38–126)
ALT: 15 U/L (ref 0–44)
AST: 27 U/L (ref 15–41)
Albumin: 3.6 g/dL (ref 3.5–5.0)
Anion gap: 9 (ref 5–15)
BUN: 7 mg/dL (ref 6–20)
CALCIUM: 9.1 mg/dL (ref 8.9–10.3)
CO2: 24 mmol/L (ref 22–32)
CREATININE: 0.95 mg/dL (ref 0.44–1.00)
Chloride: 102 mmol/L (ref 98–111)
Glucose, Bld: 120 mg/dL — ABNORMAL HIGH (ref 70–99)
Potassium: 4.2 mmol/L (ref 3.5–5.1)
SODIUM: 135 mmol/L (ref 135–145)
TOTAL PROTEIN: 7.4 g/dL (ref 6.5–8.1)
Total Bilirubin: 0.7 mg/dL (ref 0.3–1.2)

## 2018-09-09 LAB — CBC WITH DIFFERENTIAL/PLATELET
Abs Immature Granulocytes: 0.05 10*3/uL (ref 0.00–0.07)
BASOS ABS: 0 10*3/uL (ref 0.0–0.1)
BASOS PCT: 0 %
EOS ABS: 0 10*3/uL (ref 0.0–0.5)
EOS PCT: 0 %
HCT: 36.3 % (ref 36.0–46.0)
HEMOGLOBIN: 11.2 g/dL — AB (ref 12.0–15.0)
Immature Granulocytes: 1 %
LYMPHS PCT: 9 %
Lymphs Abs: 0.9 10*3/uL (ref 0.7–4.0)
MCH: 26.5 pg (ref 26.0–34.0)
MCHC: 30.9 g/dL (ref 30.0–36.0)
MCV: 85.8 fL (ref 80.0–100.0)
MONO ABS: 0.5 10*3/uL (ref 0.1–1.0)
Monocytes Relative: 5 %
NRBC: 0 % (ref 0.0–0.2)
Neutro Abs: 8.6 10*3/uL — ABNORMAL HIGH (ref 1.7–7.7)
Neutrophils Relative %: 85 %
PLATELETS: 234 10*3/uL (ref 150–400)
RBC: 4.23 MIL/uL (ref 3.87–5.11)
RDW: 15.9 % — AB (ref 11.5–15.5)
WBC: 10 10*3/uL (ref 4.0–10.5)

## 2018-09-09 LAB — ECHOCARDIOGRAM COMPLETE
Height: 62 in
Weight: 3585.6 oz

## 2018-09-09 LAB — MRSA PCR SCREENING: MRSA by PCR: NEGATIVE

## 2018-09-09 LAB — TSH: TSH: 0.906 u[IU]/mL (ref 0.350–4.500)

## 2018-09-09 MED ORDER — DILTIAZEM HCL ER COATED BEADS 120 MG PO CP24
120.0000 mg | ORAL_CAPSULE | Freq: Every day | ORAL | Status: DC
  Administered 2018-09-09 – 2018-09-10 (×2): 120 mg via ORAL
  Filled 2018-09-09 (×2): qty 1

## 2018-09-09 NOTE — Progress Notes (Addendum)
Pt with 2.2 sec pause at 1656 and 2.0 sec pause at 1700hrs.  Pt asymptomatic.  Attending Purohit notified.

## 2018-09-09 NOTE — Op Note (Signed)
NAMJanean Brown: Kathy Brown, [name] MEDICAL RECORD [id number] ACCOUNT [id number] DATE OF BIRTH:05/03/1981 FACILITY: [facility] LOCATION: [id number] PHYSICIAN:Kathy Brown, DDS  OPERATIVE REPORT  DATE OF PROCEDURE:  09/08/2018  PREOPERATIVE DIAGNOSIS:  Nonrestorable teeth secondary to dental caries and periodontitis, numbers 2, 3, 4, 5, 6, 7, 8, 9, 10, 11, 12, 13, 14, 15, 18, 19, 20, 21, 22, 23, 24, 25, 26, 27, 28, 29, 30, 31.  PROCEDURE:  Extraction of teeth numbers 2, 3, 4, 5, 6, 7, 8, 9, 10, 11, 12, 13, 14, 15, 18, 19, 20, 21, 22, 23, 24, 25, 26, 27, 28, 29, 30, 31, alveoplasty right and left maxilla and mandible.  SURGEON:  Kathy Brown, DDS  ANESTHESIA:  General nasal intubation, Dr. Renold Brown attending.  DESCRIPTION OF PROCEDURE:  The patient was taken to the operating room and placed on the table in supine position.  General anesthesia was administered, and a nasal endotracheal tube was placed and secured.  The eyes were protected, and the patient was  draped for surgery.  A timeout was performed.  The posterior pharynx was suctioned and a throat pack was placed, Lidocaine 2% 1:100,000 epinephrine was infiltrated in an inferior alveolar block on the right and left side, and then buccal and palatal  infiltration was administered in the maxilla, and buccal infiltration was administered in the anterior mandible.  A total of 20 mL was utilized.  A bite block was placed in the mouth along with the sweetheart retractor, and the left side was operated  first.  A #15 blade was used to make an incision beginning at tooth #18, carrying forward across the midline to tooth #26 in the buccal and lingual sulcus, and then a similar incision was made in the maxilla beginning at tooth #15, carrying forward  across the midline to tooth #7 in the buccal and palatal sulcus.  The periosteum was reflected from around these teeth.  Teeth were elevated with a 301 elevator and removed from the mouth with the dental forceps and  rongeurs.  The sockets were curetted  and then the periosteum was reflected to expose the alveolar crest in the left maxilla and mandible.  Then, alveoplasty was performed using an egg-shaped bur and bone file.  Then, the areas were irrigated and closed with 3-0 chromic.  The bite block and  sweetheart retractor were repositioned to the other side of the mouth, and a 15 blade was used to make an incision around teeth numbers 2, 3, 4, 5, 6 in the maxilla and around teeth numbers 27, 28, 29, 30, and 31 in the mandible.  The periosteum was  reflected.  The teeth were elevated and removed with a 301 elevator, rongeur and forceps.  Then, the periosteum was further reflected to expose the alveolus in the right maxilla and mandible.  Then, alveoplasty was performed using an egg-shaped bur and  bone file.  The sockets were curetted, and then the areas were irrigated and closed with 3-0 chromic.  The oral cavity was inspected and found to have good contour, hemostasis and closure.  The patient was irrigated and suctioned and a throat pack was  removed.  The patient was left in the care of anesthesia for extubation and transport to recovery room with plans for discharge home through day surgery.  ESTIMATED BLOOD LOSS:  Minimal.  COMPLICATIONS:  None.  SPECIMENS:  None.  LN/NUANCE  D:09/08/2018 T:09/09/2018 JOB:[id number]

## 2018-09-09 NOTE — Progress Notes (Signed)
PROGRESS NOTE    Kathy Brown  OZH:[id number] DOB: [birth year] DOA: 09/08/2018 PCP: [facility]   Brief Narrative:  37 year old with past medical history relevant for chronic atrial fibrillation, schizoaffective disorder, anxiety, noncompliance who presented on 09/08/2018 for extraction of all teeth due to non-viability and post operatively developed atrial fibrillation with rapid ventricular response in the setting of mouth pain.   Assessment & Plan:   Principal Problem:   Atrial fibrillation with RVR (HCC) Active Problems:   Paranoid schizophrenia (HCC)   #) Chronic atrial fibrillation with rapid ventricular response: Likely in the setting of medication noncompliance and pain. -Restart home diltiazem 120 mg daily -Continue home metoprolol tartrate 25 mg twice daily -Motrin -Discontinue diltiazem drip  #) psych/pain: -Pain control for dental extraction -Hold Daypro haloperidol injection -Continue benztropine 0.5 g twice daily -Continue Floxin 20 mg daily -Continue PRN hydroxyzine  Fluids: Gentle IV fluids Electrolytes: Monitor and supplement Nutrition: Soft  Prophylaxis: None  Disposition: Pending better rate control  Full code     Consultants:   None  Procedures:   09/08/2018 extraction of all teeth  Antimicrobials:   Perioperative   Subjective: Patient reports that her mouth hurts quite a bit.  She denies any chest pain, palpitations, nausea, vomiting, diarrhea.  She reports she does not take her heart rate medication for A. fib because she reports that it makes her "sleepy".  Objective: Vitals:   09/09/18 0445 09/09/18 0758 09/09/18 1104 09/09/18 1129  BP: 117/75 (!) 122/92 127/88 (!) 125/93  Pulse: 73 93 77 84  Resp: 14 20  16   Temp: 97.9 F (36.6 C) 97.8 F (36.6 C)  (!) 95.7 F (35.4 C)  TempSrc: Axillary Axillary  Axillary  SpO2: 96% 96%  100%  Weight:      Height:        Intake/Output Summary (Last 24 hours) at  09/09/2018 1235 Last data filed at 09/09/2018 0455 Gross per 24 hour  Intake 462.23 ml  Output 30 ml  Net 432.23 ml   Filed Weights   09/08/18 1007 09/08/18 1517 09/08/18 2034  Weight: 100.2 kg 100.2 kg 101.7 kg    Examination:  General exam: Appears calm and comfortable edentulous Respiratory system: Clear to auscultation. Respiratory effort normal. Cardiovascular system: Irregularly irregular, no murmurs Gastrointestinal system: Soft, nondistended, no rebound or guarding, plus bowel sounds. Central nervous system: Alert and oriented.  Grossly intact, moving all extremities Extremities: No lower extremity edema. Skin: No rashes over visible skin Psychiatry: Judgement and insight appear normal. Mood & affect appropriate.     Data Reviewed: I have personally reviewed following labs and imaging studies  CBC: Recent Labs  Lab 09/08/18 1602 09/09/18 0437  WBC 10.8* 10.0  NEUTROABS 9.9* 8.6*  HGB 11.9* 11.2*  HCT 40.2 36.3  MCV 85.9 85.8  PLT 229 234   Basic Metabolic Panel: Recent Labs  Lab 09/08/18 1602 09/09/18 0437  NA 136 135  K 4.2 4.2  CL 106 102  CO2 18* 24  GLUCOSE 146* 120*  BUN 7 7  CREATININE 0.89 0.95  CALCIUM 9.0 9.1   GFR: Estimated Creatinine Clearance: 92.3 mL/min (by C-G formula based on SCr of 0.95 mg/dL). Liver Function Tests: Recent Labs  Lab 09/08/18 1602 09/09/18 0437  AST 30 27  ALT 11 15  ALKPHOS 48 47  BILITOT 0.7 0.7  PROT 7.3 7.4  ALBUMIN 3.7 3.6   No results for input(s): LIPASE, AMYLASE in the last 168 hours. No results for  input(s): AMMONIA in the last 168 hours. Coagulation Profile: No results for input(s): INR, PROTIME in the last 168 hours. Cardiac Enzymes: Recent Labs  Lab 09/08/18 1602  TROPONINI <0.03   BNP (last 3 results) No results for input(s): PROBNP in the last 8760 hours. HbA1C: No results for input(s): HGBA1C in the last 72 hours. CBG: No results for input(s): GLUCAP in the last 168 hours. Lipid  Profile: No results for input(s): CHOL, HDL, LDLCALC, TRIG, CHOLHDL, LDLDIRECT in the last 72 hours. Thyroid Function Tests: Recent Labs    09/09/18 0437  TSH 0.906   Anemia Panel: No results for input(s): VITAMINB12, FOLATE, FERRITIN, TIBC, IRON, RETICCTPCT in the last 72 hours. Sepsis Labs: No results for input(s): PROCALCITON, LATICACIDVEN in the last 168 hours.  Recent Results (from the past 240 hour(s))  MRSA PCR Screening     Status: None   Collection Time: 09/08/18  8:38 PM  Result Value Ref Range Status   MRSA by PCR NEGATIVE NEGATIVE Final    Comment:        The GeneXpert MRSA Assay (FDA approved for NASAL specimens only), is one component of a comprehensive MRSA colonization surveillance program. It is not intended to diagnose MRSA infection nor to guide or monitor treatment for MRSA infections. Performed at [facility], [address] [place], [place] [address]          Radiology Studies: Dg Chest Port 1 View  Result Date: 09/08/2018 CLINICAL DATA:  Atrial fibrillation of for oral surgery today. Ice packs about the face and jaw. EXAM: PORTABLE CHEST 1 VIEW COMPARISON:  10/15/2017 FINDINGS: Normal heart size and mediastinal contours. Subtle opacities at the left lung base may represent superimposition of the patient's left breast shadow upon the left lung base. A subtle pneumonia is not entirely excluded and clinical correlation is therefore recommended. No effusion or pulmonary edema. No acute osseous abnormality. IMPRESSION: Subtle opacity at the left lung base may reflect superimposition of the patient's left breast shadow. Pneumonia is not entirely excluded. Otherwise negative exam. Electronically Signed   By: Tollie Ethavid  Kwon M.D.   On: 09/08/2018 16:03        Scheduled Meds: . diltiazem  120 mg Oral Daily  . FLUoxetine  20 mg Oral Daily  . hydrOXYzine  25 mg Oral BID  . Melatonin  4.5 mg Sublingual QHS  . metoprolol tartrate  25 mg Oral BID    Continuous Infusions: . diltiazem (CARDIZEM) infusion Stopped (09/09/18 0056)     LOS: 1 day    Time spent: 35    Delaine LameShrey C Josey Forcier, MD [facility]  If 7PM-7AM, please contact night-coverage [email or web address] Password [email or web address] 09/09/2018, 12:35 PM '

## 2018-09-09 NOTE — Progress Notes (Signed)
  Echocardiogram 2D Echocardiogram has been performed.  Kathy Brown, Kathy Brown 09/09/2018, 3:09 PM

## 2018-09-09 NOTE — Anesthesia Postprocedure Evaluation (Signed)
Anesthesia Post Note  Patient: Kathy Brown  Procedure(s) Performed: DENTAL RESTORATION/EXTRACTIONS (Bilateral )     Patient location during evaluation: PACU Anesthesia Type: General Level of consciousness: sedated and patient cooperative Pain management: pain level controlled Vital Signs Assessment: vitals unstable Respiratory status: spontaneous breathing Cardiovascular status: stable Anesthetic complications: no Comments: Pt non-compliant with afib therapy. Converted to RVR in OR. [facility].    Last Vitals:  Vitals:   09/09/18 0045 09/09/18 0445  BP: 101/62 117/75  Pulse: (!) 51 73  Resp: 15 14  Temp: 36.7 C 36.6 C  SpO2: 94% 96%    Last Pain:  Vitals:   09/09/18 0445  TempSrc: Axillary  PainSc:                  [name]

## 2018-09-10 LAB — BASIC METABOLIC PANEL WITH GFR
CO2: 29 mmol/L (ref 22–32)
Calcium: 9 mg/dL (ref 8.9–10.3)
Creatinine, Ser: 1 mg/dL (ref 0.44–1.00)
GFR calc Af Amer: >60 mL/min (ref >60)
GFR calc non Af Amer: >60 mL/min (ref >60)

## 2018-09-10 LAB — MAGNESIUM: Magnesium: 1.9 mg/dL (ref 1.7–2.4)

## 2018-09-10 LAB — BASIC METABOLIC PANEL
Anion gap: 7 (ref 5–15)
BUN: 8 mg/dL (ref 6–20)
Chloride: 101 mmol/L (ref 98–111)
Glucose, Bld: 110 mg/dL — ABNORMAL HIGH (ref 70–99)
Potassium: 3.9 mmol/L (ref 3.5–5.1)
Sodium: 137 mmol/L (ref 135–145)

## 2018-09-10 MED ORDER — DILTIAZEM HCL ER COATED BEADS 120 MG PO CP24: 120.0000 mg | ORAL_CAPSULE | Freq: Every day | ORAL | 0 refills | Status: DC

## 2018-09-10 MED ORDER — METOPROLOL TARTRATE 25 MG PO TABS: 25.0000 mg | ORAL_TABLET | Freq: Two times a day (BID) | ORAL | 0 refills | Status: DC

## 2018-09-10 NOTE — Care Management Important Message (Signed)
Important Message  Patient Details  Name: Kathy Brown MRN: [id number] Date of Birth: 10/15/1981   Medicare Important Message Given:  Yes    [place]etwood Pierron Stefan ChurchBratton 09/10/2018, 4:32 PM

## 2018-09-10 NOTE — Discharge Summary (Signed)
Physician Discharge Summary  Kathy Brown ZOX:[id number] DOB: 15-May-1981 DOA: 09/08/2018  PCP: [facility]  Admit date: 09/08/2018 Discharge date: 09/10/2018  Admitted From: Home Disposition: Home  Recommendations for Outpatient Follow-up:  1. Follow up with PCP in 1-2 weeks 2. Please obtain BMP/CBC in one week   Home Health: No Equipment/Devices: None  Discharge Condition: Stable CODE STATUS: Full Diet recommendation: Soft, bland  Brief/Interim Summary:  #) Chronic atrial fibrillation with RVR: Patient was admitted after having dental extraction because she was found to be in A. fib with RVR.  She apparently not been taking her home medications.  She was initially on a diltiazem drip.  Her home metoprolol and diltiazem was restarted.  She was discharged home on these medications with refills.  #) Status post dental extraction: Patient's teeth were extracted.  She was discharged home on oral pain medication.  She was given a soft bland diet.  #)  pain/psych: Patient is on Depakote haloperidol injection.  She was continued on her home benztropine, PRN hydroxyzine, fluoxetine.  Discharge Diagnoses:  Principal Problem:   Atrial fibrillation with RVR (HCC) Active Problems:   Paranoid schizophrenia [facility])    Discharge Instructions  Discharge Instructions    Call MD for:  difficulty breathing, headache or visual disturbances   Complete by:  As directed    Call MD for:  difficulty breathing, headache or visual disturbances   Complete by:  As directed    Call MD for:  hives   Complete by:  As directed    Call MD for:  persistant nausea and vomiting   Complete by:  As directed    Call MD for:  persistant nausea and vomiting   Complete by:  As directed    Call MD for:  redness, tenderness, or signs of infection (pain, swelling, redness, odor or green/yellow discharge around incision site)   Complete by:  As directed    Call MD for:  severe uncontrolled  pain   Complete by:  As directed    Call MD for:  severe uncontrolled pain   Complete by:  As directed    Call MD for:  temperature >100.4   Complete by:  As directed    Call MD for:  temperature >100.4   Complete by:  As directed    Diet - low sodium heart healthy   Complete by:  As directed    Soft Diet. Advance as tolerated.   Diet - low sodium heart healthy   Complete by:  As directed    Discharge instructions   Complete by:  As directed    Ice to affected area for 2-3 days. Warm salt water mouth rinses 4-5 times per day starting the day after surgery. Soft diet, advance as tolerated. No smoking for 2 weeks. Follow-up visit with Dr. Barbette Merino as scheduled. Call [phone] for problems.   Discharge instructions   Complete by:  As directed    Please take your medication as prescribed.  Please follow-up with your primary care doctor in 1 week.   Increase activity slowly   Complete by:  As directed    Increase activity slowly   Complete by:  As directed      Allergies as of 09/10/2018   No Known Allergies     Medication List    TAKE these medications   amoxicillin 500 MG capsule Commonly known as:  AMOXIL Take 1 capsule (500 mg total) by mouth 3 (three) times daily.  benztropine 0.5 MG tablet Commonly known as:  COGENTIN Take 1 tablet (0.5 mg total) by mouth 2 (two) times daily.   diltiazem 120 MG 24 hr capsule Commonly known as:  CARDIZEM CD Take 1 capsule (120 mg total) by mouth daily.   FLUoxetine 20 MG tablet Commonly known as:  PROZAC Take 1 tablet (20 mg total) by mouth daily.   haloperidol 2 MG tablet Commonly known as:  HALDOL Take 1 tablet (2 mg total) by mouth at bedtime.   haloperidol decanoate 100 MG/ML injection Commonly known as:  HALDOL DECANOATE Inject 1 mL into the muscle every 28 (twenty-eight) days.   hydrOXYzine 25 MG tablet Commonly known as:  ATARAX/VISTARIL Take 1 tablet (25 mg total) by mouth 2 (two) times daily as needed for  anxiety. What changed:  Another medication with the same name was removed. Continue taking this medication, and follow the directions you see here.   Melatonin 5 MG Tbdp Place 5 mg under the tongue at bedtime.   metoprolol tartrate 25 MG tablet Commonly known as:  LOPRESSOR Take 1 tablet (25 mg total) by mouth 2 (two) times daily.   oxyCODONE-acetaminophen 5-325 MG tablet Commonly known as:  PERCOCET/ROXICET Take 1 tablet by mouth every 4 (four) hours as needed.   traZODone 50 MG tablet Commonly known as:  DESYREL Take 0.5 tablets (25 mg total) by mouth at bedtime as needed for sleep.       No Known Allergies  Consultations:  Dentistry   Procedures/Studies: Dg Chest Port 1 View  Result Date: 09/08/2018 CLINICAL DATA:  Atrial fibrillation of for oral surgery today. Ice packs about the face and jaw. EXAM: PORTABLE CHEST 1 VIEW COMPARISON:  10/15/2017 FINDINGS: Normal heart size and mediastinal contours. Subtle opacities at the left lung base may represent superimposition of the patient's left breast shadow upon the left lung base. A subtle pneumonia is not entirely excluded and clinical correlation is therefore recommended. No effusion or pulmonary edema. No acute osseous abnormality. IMPRESSION: Subtle opacity at the left lung base may reflect superimposition of the patient's left breast shadow. Pneumonia is not entirely excluded. Otherwise negative exam. Electronically Signed   By: Tollie Eth M.D.   On: 09/08/2018 16:03    Echo 09/09/2018: - Left ventricle: The cavity size was normal. Wall thickness was   normal. Systolic function was normal. The estimated ejection   fraction was in the range of 55% to 60%. Wall motion was normal;   there were no regional wall motion abnormalities.  Subjective:   Discharge Exam: Vitals:   09/10/18 0335 09/10/18 0853  BP: 117/85 121/79  Pulse: 64 69  Resp:    Temp: 97.6 F (36.4 C) 98 F (36.7 C)  SpO2: 100% 96%   Vitals:    09/10/18 0008 09/10/18 0332 09/10/18 0335 09/10/18 0853  BP: (!) 122/94  117/85 121/79  Pulse: 71  64 69  Resp:      Temp: (!) 97.4 F (36.3 C)  97.6 F (36.4 C) 98 F (36.7 C)  TempSrc: Oral  Oral Oral  SpO2: 96%  100% 96%  Weight:  99.5 kg    Height:        General exam: Appears calm and comfortable edentulous Respiratory system: Clear to auscultation. Respiratory effort normal. Cardiovascular system: Irregularly irregular, no murmurs Gastrointestinal system: Soft, nondistended, no rebound or guarding, plus bowel sounds. Central nervous system: Alert and oriented.  Grossly intact, moving all extremities Extremities: No lower extremity edema. Skin: No rashes  over visible skin Psychiatry: Judgement and insight appear normal. Mood & affect appropriate.     The results of significant diagnostics from this hospitalization (including imaging, microbiology, ancillary and laboratory) are listed below for reference.     Microbiology: Recent Results (from the past 240 hour(s))  MRSA PCR Screening     Status: None   Collection Time: 09/08/18  8:38 PM  Result Value Ref Range Status   MRSA by PCR NEGATIVE NEGATIVE Final    Comment:        The GeneXpert MRSA Assay (FDA approved for NASAL specimens only), is one component of a comprehensive MRSA colonization surveillance program. It is not intended to diagnose MRSA infection nor to guide or monitor treatment for MRSA infections. Performed at [facility], [address] [place], [place] [address]      Labs: BNP (last 3 results) No results for input(s): BNP in the last 8760 hours. Basic Metabolic Panel: Recent Labs  Lab 09/08/18 1602 09/09/18 0437 09/10/18 0404  NA 136 135 137  K 4.2 4.2 3.9  CL 106 102 101  CO2 18* 24 29  GLUCOSE 146* 120* 110*  BUN 7 7 8   CREATININE 0.89 0.95 1.00  CALCIUM 9.0 9.1 9.0  MG  --   --  1.9   Liver Function Tests: Recent Labs  Lab 09/08/18 1602 09/09/18 0437  AST 30 27   ALT 11 15  ALKPHOS 48 47  BILITOT 0.7 0.7  PROT 7.3 7.4  ALBUMIN 3.7 3.6   No results for input(s): LIPASE, AMYLASE in the last 168 hours. No results for input(s): AMMONIA in the last 168 hours. CBC: Recent Labs  Lab 09/08/18 1602 09/09/18 0437  WBC 10.8* 10.0  NEUTROABS 9.9* 8.6*  HGB 11.9* 11.2*  HCT 40.2 36.3  MCV 85.9 85.8  PLT 229 234   Cardiac Enzymes: Recent Labs  Lab 09/08/18 1602  TROPONINI <0.03   BNP: Invalid input(s): POCBNP CBG: No results for input(s): GLUCAP in the last 168 hours. D-Dimer No results for input(s): DDIMER in the last 72 hours. Hgb A1c No results for input(s): HGBA1C in the last 72 hours. Lipid Profile No results for input(s): CHOL, HDL, LDLCALC, TRIG, CHOLHDL, LDLDIRECT in the last 72 hours. Thyroid function studies Recent Labs    09/09/18 0437  TSH 0.906   Anemia work up No results for input(s): VITAMINB12, FOLATE, FERRITIN, TIBC, IRON, RETICCTPCT in the last 72 hours. Urinalysis    Component Value Date/Time   COLORURINE YELLOW 07/09/2018 1509   APPEARANCEUR CLEAR 07/09/2018 1509   LABSPEC 1.008 07/09/2018 1509   PHURINE 5.0 07/09/2018 1509   GLUCOSEU NEGATIVE 07/09/2018 1509   HGBUR NEGATIVE 07/09/2018 1509   BILIRUBINUR NEGATIVE 07/09/2018 1509   KETONESUR NEGATIVE 07/09/2018 1509   PROTEINUR NEGATIVE 07/09/2018 1509   UROBILINOGEN 0.2 09/20/2014 1135   NITRITE NEGATIVE 07/09/2018 1509   LEUKOCYTESUR NEGATIVE 07/09/2018 1509   Sepsis Labs Invalid input(s): PROCALCITONIN,  WBC,  LACTICIDVEN Microbiology Recent Results (from the past 240 hour(s))  MRSA PCR Screening     Status: None   Collection Time: 09/08/18  8:38 PM  Result Value Ref Range Status   MRSA by PCR NEGATIVE NEGATIVE Final    Comment:        The GeneXpert MRSA Assay (FDA approved for NASAL specimens only), is one component of a comprehensive MRSA colonization surveillance program. It is not intended to diagnose MRSA infection nor to guide  or monitor treatment for MRSA infections. Performed  at [facility], [address] [place], [place] [address]      Time coordinating discharge: 535  SIGNED:   Delaine LameShrey C Sagal Gayton, MD  [facility] 09/10/2018, 11:25 AM  If 7PM-7AM, please contact night-coverage [email or web address] Password [email or web address]

## 2018-09-15 NOTE — Consult Note (Signed)
            [facility] CM Primary Care Navigator  09/15/2018  Kathy Brown 10/15/1981 [id number]   Attempt to seepatient at the bedside to identify possible discharge needs butshe was discharged home.  Per MD note, patient was admitted having a rapid ventricular response to her atrial fibrillation after she had all her teeth pulled.  Patient has discharge instruction to follow-up withher primary care provider (confirmed Kathy OrleansKimberly Millsats, NP with [facility]) with in 1- 2 weeks.   For additional questions please contact:  [name], BSN, RN-BC [facility] PRIMARY CARE Navigator Cell: [phone]

## 2018-09-17 DIAGNOSIS — F849 Pervasive developmental disorder, unspecified: Secondary | ICD-10-CM | POA: Diagnosis not present

## 2018-09-17 DIAGNOSIS — F331 Major depressive disorder, recurrent, moderate: Secondary | ICD-10-CM | POA: Diagnosis not present

## 2018-09-17 DIAGNOSIS — F250 Schizoaffective disorder, bipolar type: Secondary | ICD-10-CM | POA: Diagnosis not present

## 2018-10-18 DIAGNOSIS — F250 Schizoaffective disorder, bipolar type: Secondary | ICD-10-CM | POA: Diagnosis not present

## 2018-10-18 DIAGNOSIS — F849 Pervasive developmental disorder, unspecified: Secondary | ICD-10-CM | POA: Diagnosis not present

## 2018-10-18 DIAGNOSIS — F331 Major depressive disorder, recurrent, moderate: Secondary | ICD-10-CM | POA: Diagnosis not present

## 2018-11-03 IMAGING — CR DG HAND COMPLETE 3+V*R*
3 series · 3 of 3 positions shown · non-contrast
Comparison: None.

CLINICAL DATA: Tripped and fell on [address] steps.  Laceration.

EXAM:
RIGHT HAND - COMPLETE 3+ VIEW

[hand pa]
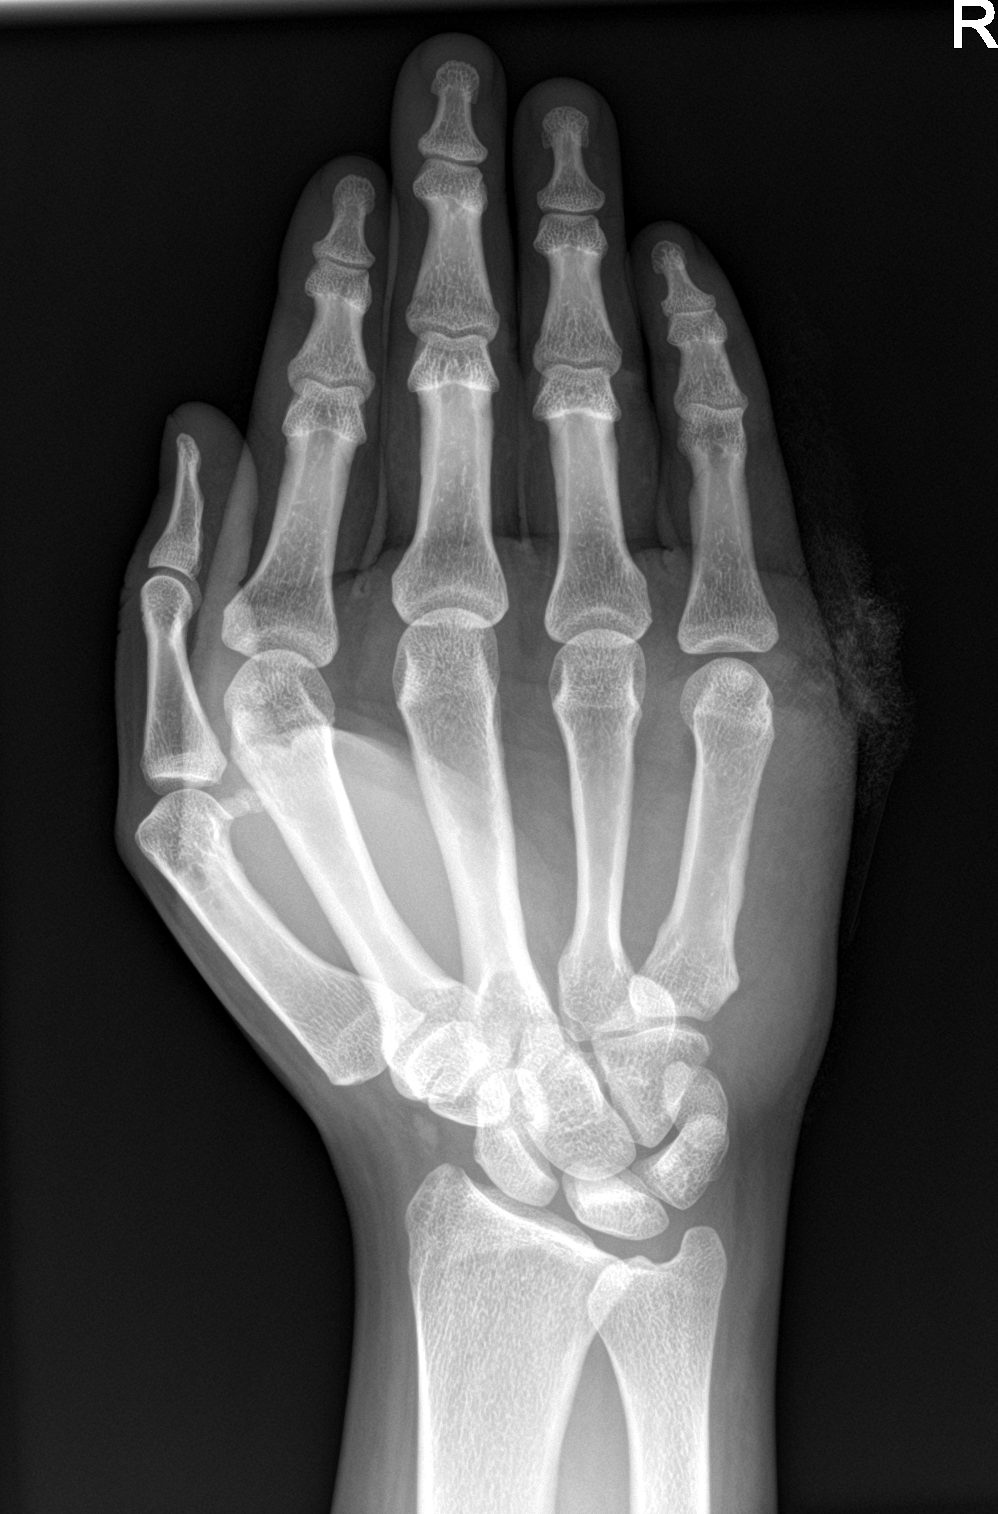

[hand obl]
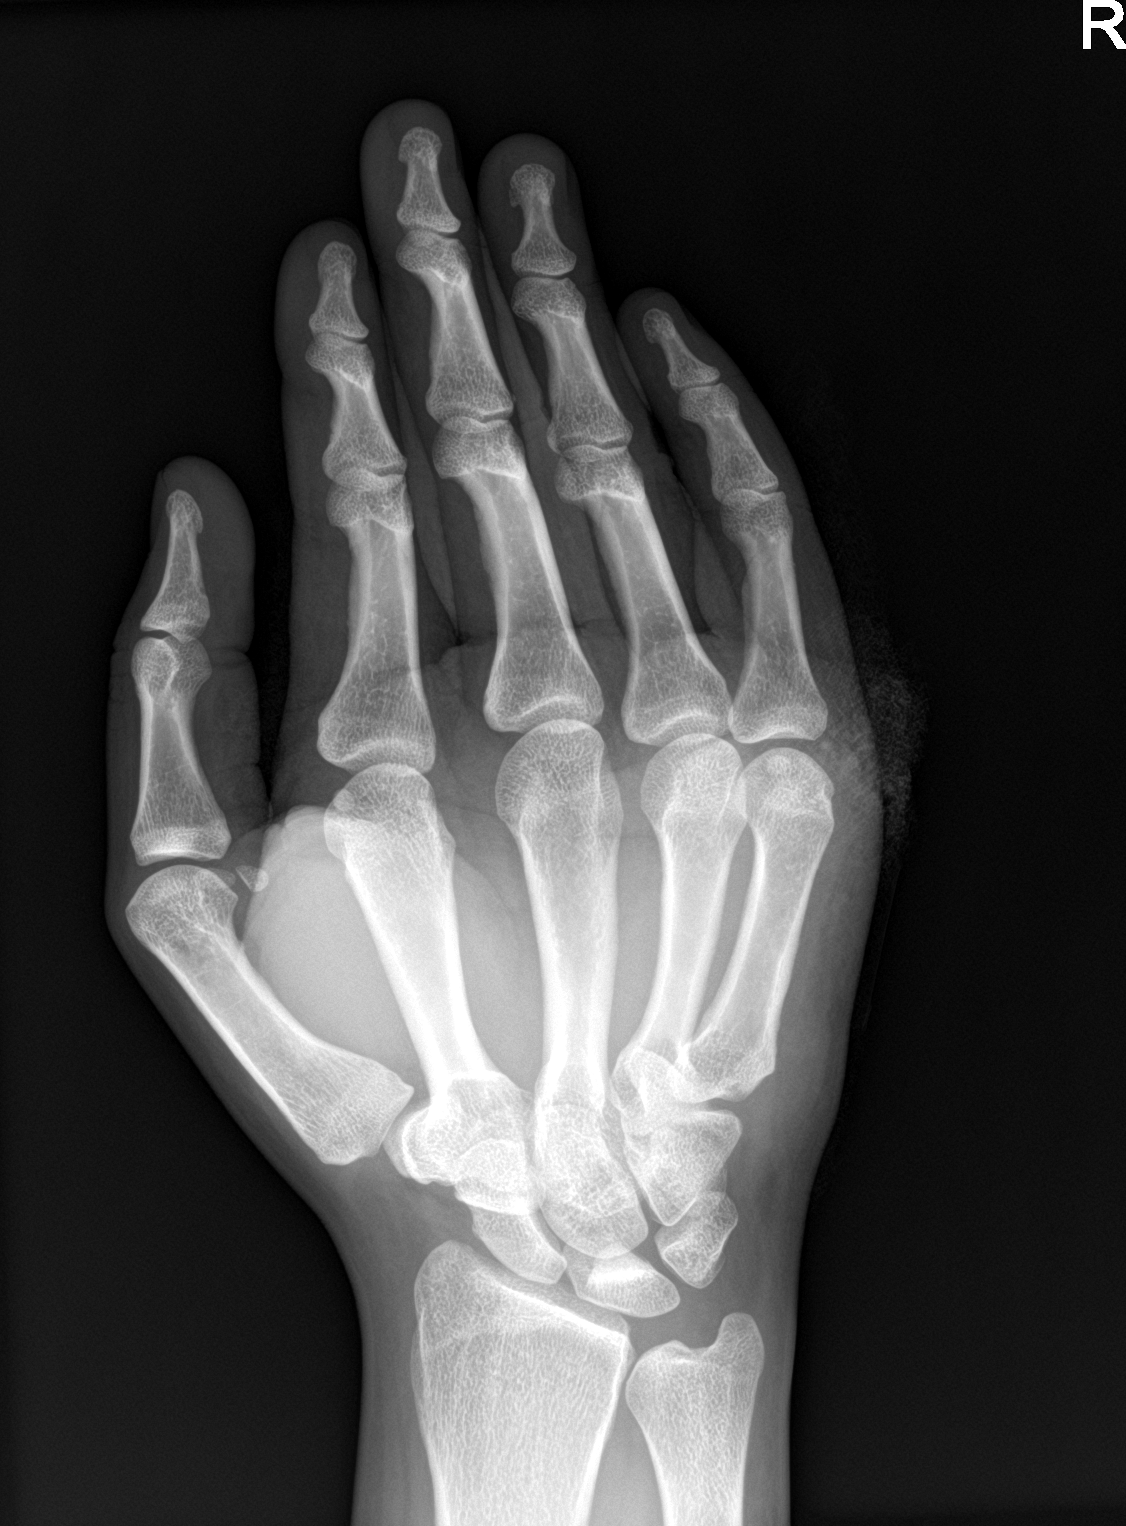

[hand lat]
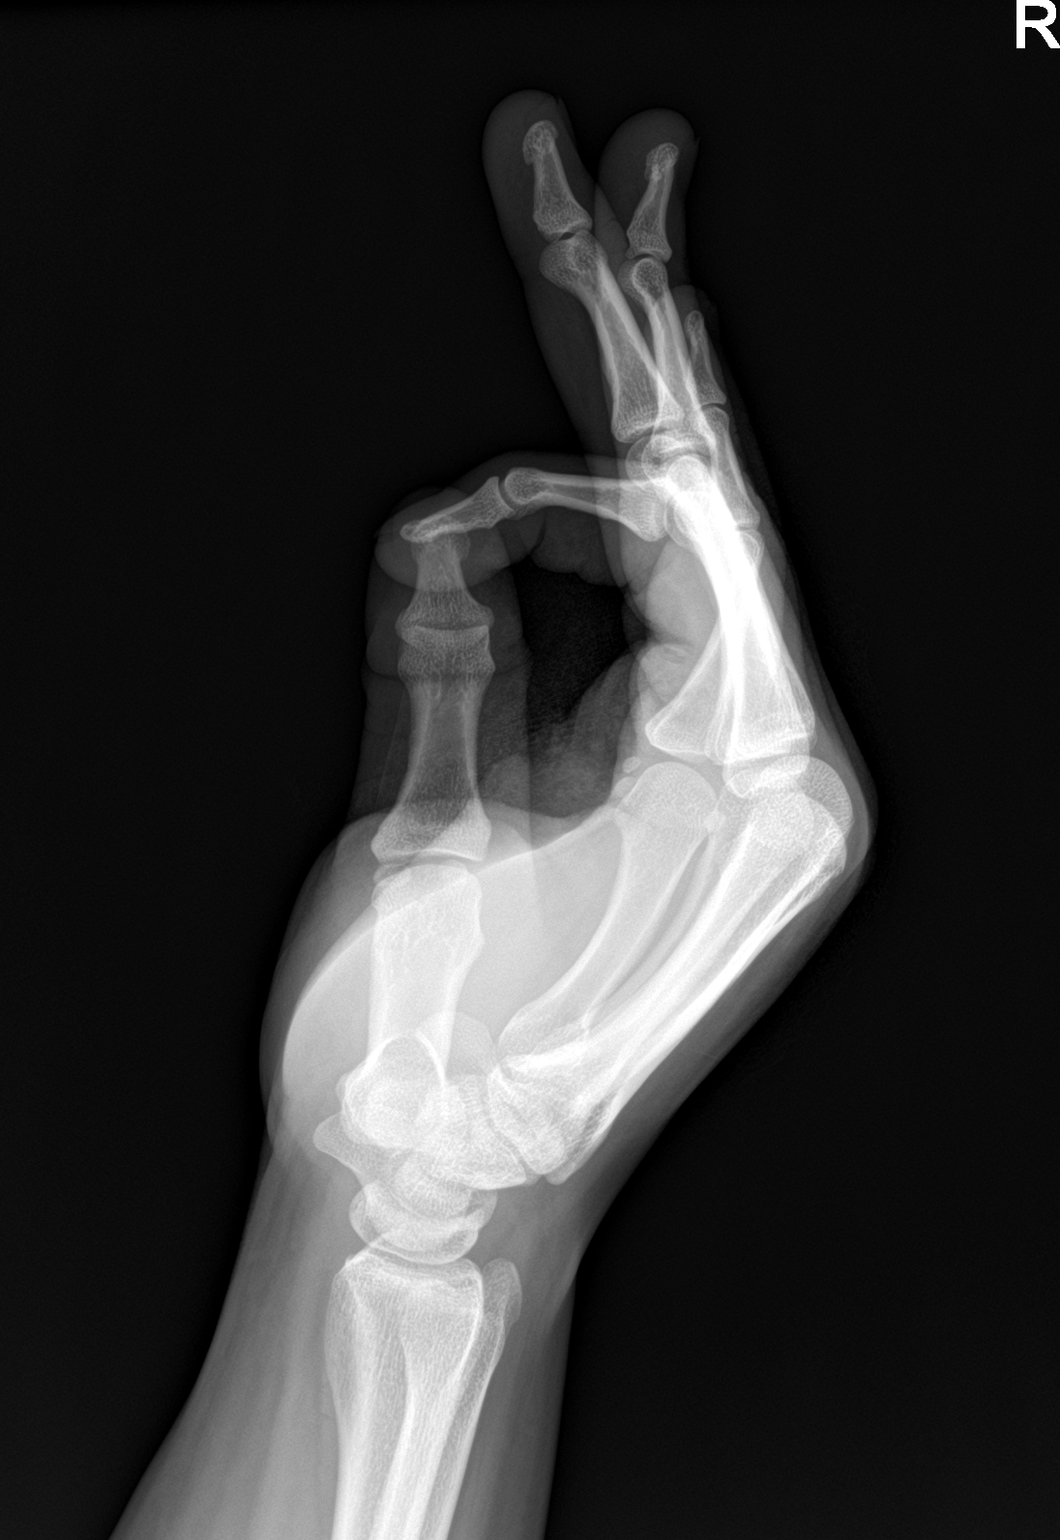

[3 of 3 positions shown; findings below may reference images not displayed]

FINDINGS: Soft tissue injury of the medial hand adjacent to the MCP joint of
the small finger. No sign of fracture, radiopaque foreign object or
air/ gas in the joint.
IMPRESSION: Soft tissue injury.

## 2018-11-22 ENCOUNTER — Encounter (HOSPITAL_COMMUNITY): Payer: Self-pay | Admitting: Emergency Medicine

## 2018-11-22 ENCOUNTER — Emergency Department (HOSPITAL_COMMUNITY)
Admission: EM | Admit: 2018-11-22 | Discharge: 2018-11-23 | Disposition: A | Discharge status: Home / Self Care | Payer: Medicare Other | Attending: Emergency Medicine | Admitting: Emergency Medicine

## 2018-11-22 ENCOUNTER — Other Ambulatory Visit: Payer: Self-pay

## 2018-11-22 ENCOUNTER — Emergency Department (HOSPITAL_COMMUNITY): Payer: Medicare Other

## 2018-11-22 DIAGNOSIS — I4891 Unspecified atrial fibrillation: Secondary | ICD-10-CM | POA: Insufficient documentation

## 2018-11-22 DIAGNOSIS — R0602 Shortness of breath: Secondary | ICD-10-CM | POA: Diagnosis not present

## 2018-11-22 DIAGNOSIS — R300 Dysuria: Secondary | ICD-10-CM

## 2018-11-22 DIAGNOSIS — Z79899 Other long term (current) drug therapy: Secondary | ICD-10-CM | POA: Diagnosis not present

## 2018-11-22 DIAGNOSIS — R42 Dizziness and giddiness: Secondary | ICD-10-CM | POA: Diagnosis present

## 2018-11-22 HISTORY — DX: Unspecified atrial fibrillation: I48.91

## 2018-11-22 LAB — I-STAT BETA HCG BLOOD, ED (MC, WL, AP ONLY)

## 2018-11-22 LAB — CBC
HCT: 36.5 % (ref 36.0–46.0)
Hemoglobin: 11.4 g/dL — ABNORMAL LOW (ref 12.0–15.0)
MCH: 26.5 pg (ref 26.0–34.0)
MCHC: 31.2 g/dL (ref 30.0–36.0)
MCV: 84.9 fL (ref 80.0–100.0)
NRBC: 0 % (ref 0.0–0.2)
PLATELETS: 299 10*3/uL (ref 150–400)
RBC: 4.3 MIL/uL (ref 3.87–5.11)
RDW: 15.7 % — ABNORMAL HIGH (ref 11.5–15.5)
WBC: 6.4 10*3/uL (ref 4.0–10.5)

## 2018-11-22 LAB — BASIC METABOLIC PANEL
ANION GAP: 9 (ref 5–15)
BUN: 10 mg/dL (ref 6–20)
CALCIUM: 9.1 mg/dL (ref 8.9–10.3)
CO2: 26 mmol/L (ref 22–32)
Chloride: 102 mmol/L (ref 98–111)
Creatinine, Ser: 0.79 mg/dL (ref 0.44–1.00)
GFR calc non Af Amer: >60 mL/min (ref >60)
Glucose, Bld: 123 mg/dL — ABNORMAL HIGH (ref 70–99)
Potassium: 3.8 mmol/L (ref 3.5–5.1)
Sodium: 137 mmol/L (ref 135–145)

## 2018-11-22 MED ORDER — METOPROLOL TARTRATE 25 MG PO TABS
25.0000 mg | ORAL_TABLET | Freq: Once | ORAL | Status: AC
  Administered 2018-11-22: 25 mg via ORAL
  Filled 2018-11-22: qty 1

## 2018-11-22 MED ORDER — SODIUM CHLORIDE 0.9 % IV SOLN
INTRAVENOUS | Status: DC
  Administered 2018-11-22: 22:00:00 via INTRAVENOUS

## 2018-11-22 NOTE — ED Triage Notes (Signed)
Pt BIB [facility] from home, c/o dizziness that started this am. Hx afib, reports that she didn't take her meds this am. C/o chest pain-states she always has chest pain and this is her normal, also c/o mild shortness of breath. EMS HR max 130.

## 2018-11-23 DIAGNOSIS — I4891 Unspecified atrial fibrillation: Secondary | ICD-10-CM | POA: Diagnosis not present

## 2018-11-23 LAB — URINALYSIS, ROUTINE W REFLEX MICROSCOPIC
Bilirubin Urine: NEGATIVE
Glucose, UA: NEGATIVE mg/dL
Hgb urine dipstick: NEGATIVE
Ketones, ur: NEGATIVE mg/dL
Leukocytes, UA: NEGATIVE
NITRITE: NEGATIVE
Protein, ur: NEGATIVE mg/dL
SPECIFIC GRAVITY, URINE: 1.006 (ref 1.005–1.030)
pH: 6 (ref 5.0–8.0)

## 2018-11-23 NOTE — Discharge Instructions (Addendum)
Urinalysis negative for urinary tract infection.  Very important to take your diltiazem a.m. Cardizem CD as directed every morning.  And also to take your Lopressor once in the morning and once in the evening.  Make an appointment to follow-up with your urgent care and cardiology.  Return for any new or worse symptoms.

## 2018-11-23 NOTE — ED Provider Notes (Signed)
[facility] EMERGENCY DEPARTMENT Provider Note   CSN: [id number] Arrival date & time: 11/22/18  2133     History   Chief Complaint Chief Complaint  Patient presents with  . Atrial Fibrillation    HPI Kathy Brown is a 38 y.o. female.  Patient brought in by EMS.  Patient brought in from home.  Patient with known history of atrial fibrillation.  She did not take her medications today.  Her mother is now here with her and is supposed to be giving her meds she says that she will make sure she gets them.  Patient's past medical history is significant for schizophrenia chronic back pain atrial fibrillation.  Patient also had the complaint of some dizziness but denies any room spinning.  Patient had some mild shortness of breath EMS noted max heart rate to be 130.  Upon arrival here she was more 110.  Afebrile blood pressure was normal.  Patient's main concern is that she thinks she has a urinary tract infection because is been burning when she is urinates.Marland Kitchen Despite the longstanding history of atrial fibrillation patient is not on blood thinners.  And not supposed to be     Past Medical History:  Diagnosis Date  . A-fib (HCC)   . Anxiety   . Chronic back pain   . Dysrhythmia    Atrial fibrillation  . Schizophrenia (HCC)   . Urinary incontinence     Patient Active Problem List   Diagnosis Date Noted  . A-fib (HCC) 09/08/2018  . Atrial fibrillation with RVR (HCC) 10/15/2017  . Auditory hallucinations   . Paranoid schizophrenia (HCC)   . Schizoaffective disorder (HCC) 03/25/2014  . Psychosis (HCC) 03/25/2014    Past Surgical History:  Procedure Laterality Date  . TOOTH EXTRACTION Bilateral 09/08/2018   Procedure: DENTAL RESTORATION/EXTRACTIONS;  Surgeon: Ocie Doyne, DDS;  Location: [facility];  Service: Oral Surgery;  Laterality: Bilateral;     OB History    Gravida      Para      Term      Preterm      AB      Living  3       SAB      TAB      Ectopic      Multiple      Live Births               Home Medications    Prior to Admission medications   Medication Sig Start Date End Date Taking? Authorizing Provider  diltiazem (CARDIZEM CD) 120 MG 24 hr capsule Take 1 capsule (120 mg total) by mouth daily. 09/10/18 09/10/19 Yes Purohit, Salli Quarry, MD  FLUoxetine (PROZAC) 20 MG tablet Take 1 tablet (20 mg total) by mouth daily. 12/15/16  Yes Kirichenko, Tatyana, PA-C  haloperidol decanoate (HALDOL DECANOATE) 100 MG/ML injection Inject 1 mL into the muscle every 28 (twenty-eight) days. 06/26/18  Yes [provider]  hydrOXYzine (ATARAX/VISTARIL) 25 MG tablet Take 1 tablet (25 mg total) by mouth 2 (two) times daily as needed for anxiety. 11/25/16  Yes Garlon Hatchet, PA-C  hydrOXYzine (ATARAX/VISTARIL) 25 MG tablet Take 1 tablet (25 mg total) by mouth 2 (two) times daily. 12/15/16  Yes Kirichenko, Tatyana, PA-C  Melatonin 5 MG TBDP Place 5 mg under the tongue at bedtime. 06/26/18  Yes [provider]  metoprolol tartrate (LOPRESSOR) 25 MG tablet Take 1 tablet (25 mg total) by mouth 2 (two) times daily.  09/10/18  Yes Purohit, Salli QuarryShrey C, MD  amoxicillin (AMOXIL) 500 MG capsule Take 1 capsule (500 mg total) by mouth 3 (three) times daily. Patient not taking: Reported on 11/22/2018 09/08/18   Ocie DoyneJensen, Neeta Storey, DDS  benztropine (COGENTIN) 0.5 MG tablet Take 1 tablet (0.5 mg total) by mouth 2 (two) times daily. Patient not taking: Reported on 03/02/2018 08/25/16   Eyvonne MechanicHedges, Jeffrey, PA-C  haloperidol (HALDOL) 2 MG tablet Take 1 tablet (2 mg total) by mouth at bedtime. Patient not taking: Reported on 03/02/2018 11/25/16   Garlon HatchetSanders, Lisa M, PA-C  oxyCODONE-acetaminophen (PERCOCET) 5-325 MG tablet Take 1 tablet by mouth every 4 (four) hours as needed. Patient not taking: Reported on 11/22/2018 09/08/18   Ocie DoyneJensen, Rorey Bisson, DDS  traZODone (DESYREL) 50 MG tablet Take 0.5 tablets (25 mg total) by mouth at bedtime as needed for  sleep. Patient not taking: Reported on 03/02/2018 04/25/15   Toy Cookeyocherty, Megan, MD    Family History Family History  Problem Relation Age of Onset  . Hypertension Mother   . Diabetes Mother     Social History Social History   Tobacco Use  . Smoking status: Never Smoker  . Smokeless tobacco: Never Used  Substance Use Topics  . Alcohol use: No  . Drug use: No     Allergies   Patient has no known allergies.   Review of Systems Review of Systems  Constitutional: Negative for chills and fever.  HENT: Negative for rhinorrhea and sore throat.   Eyes: Negative for visual disturbance.  Respiratory: Positive for shortness of breath. Negative for cough.   Cardiovascular: Positive for palpitations. Negative for chest pain and leg swelling.  Gastrointestinal: Negative for abdominal pain, diarrhea, nausea and vomiting.  Genitourinary: Positive for dysuria.  Musculoskeletal: Negative for back pain and neck pain.  Skin: Negative for rash.  Neurological: Positive for dizziness. Negative for syncope, light-headedness and headaches.  Hematological: Does not bruise/bleed easily.  Psychiatric/Behavioral: Negative for confusion.     Physical Exam Updated Vital Signs BP (!) 106/51   Pulse 66   Temp 97.6 F (36.4 C) (Oral)   Resp (!) 24   SpO2 100%   Physical Exam Vitals signs and nursing note reviewed.  Constitutional:      General: She is not in acute distress.    Appearance: She is well-developed.  HENT:     Head: Normocephalic and atraumatic.     Mouth/Throat:     Mouth: Mucous membranes are moist.  Eyes:     Conjunctiva/sclera: Conjunctivae normal.  Neck:     Musculoskeletal: Normal range of motion and neck supple.  Cardiovascular:     Rate and Rhythm: Tachycardia present. Rhythm irregular.     Heart sounds: No murmur.  Pulmonary:     Effort: Pulmonary effort is normal. No respiratory distress.     Breath sounds: Normal breath sounds.  Abdominal:     Palpations:  Abdomen is soft.     Tenderness: There is no abdominal tenderness.  Musculoskeletal: Normal range of motion.        General: No swelling.  Skin:    General: Skin is warm and dry.  Neurological:     General: No focal deficit present.     Mental Status: She is alert and oriented to person, place, and time.     Cranial Nerves: No cranial nerve deficit.     Motor: No weakness.      ED Treatments / Results  Labs (all labs ordered are listed, but only abnormal  results are displayed) Labs Reviewed  BASIC METABOLIC PANEL - Abnormal; Notable for the following components:      Result Value   Glucose, Bld 123 (*)    All other components within normal limits  CBC - Abnormal; Notable for the following components:   Hemoglobin 11.4 (*)    RDW 15.7 (*)    All other components within normal limits  URINALYSIS, ROUTINE W REFLEX MICROSCOPIC - Abnormal; Notable for the following components:   Color, Urine STRAW (*)    All other components within normal limits  I-STAT BETA HCG BLOOD, ED ([facility], [facility], [facility] ONLY)   Results for orders placed or performed during the hospital encounter of 11/22/18  Basic metabolic panel  Result Value Ref Range   Sodium 137 135 - 145 mmol/L   Potassium 3.8 3.5 - 5.1 mmol/L   Chloride 102 98 - 111 mmol/L   CO2 26 22 - 32 mmol/L   Glucose, Bld 123 (H) 70 - 99 mg/dL   BUN 10 6 - 20 mg/dL   Creatinine, Ser 9.140.79 0.44 - 1.00 mg/dL   Calcium 9.1 8.9 - 78.210.3 mg/dL   GFR calc non Af Amer >60 >60 mL/min   GFR calc Af Amer >60 >60 mL/min   Anion gap 9 5 - 15  CBC  Result Value Ref Range   WBC 6.4 4.0 - 10.5 K/uL   RBC 4.30 3.87 - 5.11 MIL/uL   Hemoglobin 11.4 (L) 12.0 - 15.0 g/dL   HCT 95.636.5 21.336.0 - 08.646.0 %   MCV 84.9 80.0 - 100.0 fL   MCH 26.5 26.0 - 34.0 pg   MCHC 31.2 30.0 - 36.0 g/dL   RDW 57.815.7 (H) 46.911.5 - 62.915.5 %   Platelets 299 150 - 400 K/uL   nRBC 0.0 0.0 - 0.2 %  Urinalysis, Routine w reflex microscopic  Result Value Ref Range   Color, Urine STRAW (A) YELLOW    APPearance CLEAR CLEAR   Specific Gravity, Urine 1.006 1.005 - 1.030   pH 6.0 5.0 - 8.0   Glucose, UA NEGATIVE NEGATIVE mg/dL   Hgb urine dipstick NEGATIVE NEGATIVE   Bilirubin Urine NEGATIVE NEGATIVE   Ketones, ur NEGATIVE NEGATIVE mg/dL   Protein, ur NEGATIVE NEGATIVE mg/dL   Nitrite NEGATIVE NEGATIVE   Leukocytes, UA NEGATIVE NEGATIVE  I-Stat beta hCG blood, ED  Result Value Ref Range   I-stat hCG, quantitative <5.0 <5 mIU/mL   Comment 3             EKG EKG Interpretation  Date/Time:  Monday November 22 2018 21:37:48 EST Ventricular Rate:  123 PR Interval:    QRS Duration: 122 QT Interval:  351 QTC Calculation: 471 R Axis:   64 Text Interpretation:  Atrial fibrillation Ventricular premature complex IVCD, consider atypical RBBB Confirmed by Vanetta MuldersZackowski, Shelie Lansing [phone]([id number]) on 11/22/2018 9:51:14 PM   Radiology Dg Chest Port 1 View  Result Date: 11/22/2018 CLINICAL DATA:  38 y/o F; dizziness, chest pain, shortness of breath. EXAM: PORTABLE CHEST 1 VIEW COMPARISON:  09/08/2018 chest radiograph FINDINGS: Stable heart size and mediastinal contours are within normal limits. Both lungs are clear. The visualized skeletal structures are unremarkable. IMPRESSION: Stable negative chest x-ray. Electronically Signed   By: Mitzi HansenLance  Furusawa-Stratton M.D.   On: 11/22/2018 22:21    Procedures Procedures (including critical care time)  Medications Ordered in ED Medications  0.9 %  sodium chloride infusion ( Intravenous New Bag/Given 11/22/18 2213)  metoprolol tartrate (LOPRESSOR) tablet 25 mg (25 mg Oral  Given 11/22/18 2236)     Initial Impression / Assessment and Plan / ED Course  I have reviewed the triage vital signs and the nursing notes.  Pertinent labs & imaging results that were available during my care of the patient were reviewed by me and considered in my medical decision making (see chart for details).     Patient upon arrival did have evidence of atrial fibrillation but heart rate  pretty much in the low 100s.  Since she had not had her medications in particular she takes Lopressor twice a day she was given her evening dose of Lopressor.  Normally she also takes diltiazem first thing in the morning.  Patient in no distress.  Chest x-ray negative.  Labs without any significant abnormalities.  Pregnancy test negative.  EKG consistent with atrial fib.  Patient without any significant chest pain.  Not hypoxic.  Oxygen saturations were 100% on room air not tachypneic.  No concerns for pulmonary embolus based on presentation.  Patient given the Lopressor brought heart rate down into the 90s.  Still sometimes slipping into the low 100s.  Patient's urinalysis was negative for urinary tract infection.  Patient stable for discharge home mother will ensure that she gets her diltiazem and her Lopressor as directed.  Recommend following up with cardiology.  Or back with the [facility] urgent care.  Final Clinical Impressions(s) / ED Diagnoses   Final diagnoses:  Atrial fibrillation with RVR [facility])  Dysuria    ED Discharge Orders    None       Vanetta Mulders, MD 11/23/18 [phone]

## 2019-01-19 ENCOUNTER — Telehealth: Payer: Self-pay

## 2019-01-19 NOTE — Telephone Encounter (Signed)
   Primary Cardiologist:  No primary care provider on file.   Patient contacted.  History reviewed.  No symptoms to suggest any unstable cardiac conditions.  Based on discussion, with current pandemic situation, we will be postponing this appointment for Kathy Brown with a plan for f/u in  wks or sooner if feasible/necessary.  If symptoms change, she has been instructed to contact our office.   Routing to C19 CANCEL pool for tracking (P CV DIV CV19 CANCEL - reason for visit "other.") and assigning priority (1 = 4-6 wks, 2 = 6-12 wks, 3 = >12 wks).   Ernie Hew, CMA  01/19/2019 2:32 PM         .

## 2019-01-26 ENCOUNTER — Ambulatory Visit: Payer: Medicare Other | Admitting: Cardiovascular Disease

## 2019-02-14 ENCOUNTER — Telehealth: Payer: Self-pay

## 2019-02-14 NOTE — Telephone Encounter (Signed)
Virtual Visit Pre-Appointment Phone Call  Steps For Call:  1. Confirm consent - "In the setting of the current Covid19 crisis, you are scheduled for a VIDEO visit with Dr. Royann Shiversroitoru on 02/17/2019  at 9:20AM.  Just as we do with many in-office visits, in order for you to participate in this visit, we must obtain consent.  If you'd like, I can send this to your mychart (if signed up) or email for you to review.  Otherwise, I can obtain your verbal consent now.  All virtual visits are billed to your insurance company just like a normal visit would be.  By agreeing to a virtual visit, we'd like you to understand that the technology does not allow for your provider to perform an examination, and thus may limit your provider's ability to fully assess your condition. If your provider identifies any concerns that need to be evaluated in person, we will make arrangements to do so.  Finally, though the technology is pretty good, we cannot assure that it will always work on either your or our end, and in the setting of a video visit, we may have to convert it to a phone-only visit.  In either situation, we cannot ensure that we have a secure connection.  Are you willing to proceed?" STAFF: Did the patient verbally acknowledge consent to telehealth visit? Document YES/NO here: YES  2. Confirm the BEST phone number to call the day of the visit by including in appointment notes  3. Give patient instructions for WebEx/MyChart download to smartphone as below or Doximity/Doxy.me if video visit (depending on what platform provider is using)  4. Advise patient to be prepared with their blood pressure, heart rate, weight, any heart rhythm information, their current medicines, and a piece of paper and pen handy for any instructions they may receive the day of their visit  5. Inform patient they will receive a phone call 15 minutes prior to their appointment time (may be from unknown caller ID) so they should be prepared  to answer  6. Confirm that appointment type is correct in Epic appointment notes (VIDEO vs PHONE)     TELEPHONE CALL NOTE  Kathy Brown has been deemed a candidate for a follow-up tele-health visit to limit community exposure during the Covid-19 pandemic. I spoke with the patient via phone to ensure availability of phone/video source, confirm preferred email & phone number, and discuss instructions and expectations.  I reminded Kathy Brown to be prepared with any vital sign and/or heart rhythm information that could potentially be obtained via home monitoring, at the time of her visit. I reminded Kathy Brown to expect a phone call at the time of her visit if her visit.  Dorris Fetcherrah Mikaela Hilgeman, CMA 02/14/2019 10:25 AM   INSTRUCTIONS FOR DOWNLOADING THE WEBEX APP TO SMARTPHONE  - If Apple, ask patient to go to App Store and type in WebEx in the search bar. Download [employer]ex Meetings, the blue/green circle. If Android, go to [employer] Play Store and type in [employer]WebEx in the search bar. The app is free but as with any other app downloads, their phone may require them to verify saved payment information or Apple/Android password.  - The patient does NOT have to create an account. - On the day of the visit, the assist will walk the patient through joining the meeting with the meeting number/password.  INSTRUCTIONS FOR DOWNLOADING THE MYCHART APP TO SMARTPHONE  - The patient must first make sure to have activated  MyChart and know their login information - If Apple, go to [employer] and type in MyChart in the search bar and download the app. If Android, ask patient to go to [employer] and type in [place] in the search bar and download the app. The app is free but as with any other app downloads, their phone may require them to verify saved payment information or Apple/Android password.  - The patient will need to then log into the app with their MyChart username and password, and select [facility] as their healthcare provider to link the account. When it is time for your visit, go to the MyChart app, find appointments, and click Begin Video Visit. Be sure to Select Allow for your device to access the Microphone and Camera for your visit. You will then be connected, and your provider will be with you shortly.  **If they have any issues connecting, or need assistance please contact MyChart service desk [phone])**  **If using a computer, in order to ensure the best quality for their visit they will need to use either of the following Internet Browsers: [employer], or Google Chrome**  IF USING DOXIMITY or DOXY.ME - The patient will receive a link just prior to their visit, either by text or email (to be determined day of appointment depending on if it's doxy.me or Doximity).     FULL LENGTH CONSENT FOR TELE-HEALTH VISIT   I hereby voluntarily request, consent and authorize [facility] and its employed or contracted physicians, physician assistants, nurse practitioners or other licensed health care professionals (the Practitioner), to provide me with telemedicine health care services (the "Services") as deemed necessary by the treating Practitioner. I acknowledge and consent to receive the Services by the Practitioner via telemedicine. I understand that the telemedicine visit will involve communicating with the Practitioner through live audiovisual communication technology and the disclosure of certain medical information by electronic transmission. I acknowledge that I have been given the opportunity to request an in-person assessment or other available alternative prior to the telemedicine visit and am voluntarily participating in the telemedicine visit.  I understand that I have the right to withhold or withdraw my consent to the use of telemedicine in the course of my care at any time, without affecting my right to future care or treatment, and that the  Practitioner or I may terminate the telemedicine visit at any time. I understand that I have the right to inspect all information obtained and/or recorded in the course of the telemedicine visit and may receive copies of available information for a reasonable fee.  I understand that some of the potential risks of receiving the Services via telemedicine include:  Marland Kitchen Delay or interruption in medical evaluation due to technological equipment failure or disruption; . Information transmitted may not be sufficient (e.g. poor resolution of images) to allow for appropriate medical decision making by the Practitioner; and/or  . In rare instances, security protocols could fail, causing a breach of personal health information.  Furthermore, I acknowledge that it is my responsibility to provide information about my medical history, conditions and care that is complete and accurate to the best of my ability. I acknowledge that Practitioner's advice, recommendations, and/or decision may be based on factors not within their control, such as incomplete or inaccurate data provided by me or distortions of diagnostic images or specimens that may result from electronic transmissions. I understand that the practice of medicine is not an exact science and that Practitioner  makes no warranties or guarantees regarding treatment outcomes. I acknowledge that I will receive a copy of this consent concurrently upon execution via email to the email address I last provided but may also request a printed copy by calling the office of [facility].    I understand that my insurance will be billed for this visit.   I have read or had this consent read to me. . I understand the contents of this consent, which adequately explains the benefits and risks of the Services being provided via telemedicine.  . I have been provided ample opportunity to ask questions regarding this consent and the Services and have had my questions answered to my  satisfaction. . I give my informed consent for the services to be provided through the use of telemedicine in my medical care  By participating in this telemedicine visit I agree to the above.

## 2019-02-16 ENCOUNTER — Telehealth: Payer: Self-pay | Admitting: Cardiovascular Disease

## 2019-02-16 ENCOUNTER — Telehealth: Payer: Self-pay

## 2019-02-16 NOTE — Telephone Encounter (Signed)
Smartphone/virtual consent/my chart via email/ pre reg completed °

## 2019-02-16 NOTE — Telephone Encounter (Signed)
Pt has appt at [facility] with Dr. Salena Saner Will remove from cancel pool   .

## 2019-02-16 NOTE — Telephone Encounter (Signed)
LVM for pre reg °

## 2019-02-17 ENCOUNTER — Telehealth (INDEPENDENT_AMBULATORY_CARE_PROVIDER_SITE_OTHER): Payer: Medicare Other | Admitting: Cardiovascular Disease

## 2019-02-17 DIAGNOSIS — I4811 Longstanding persistent atrial fibrillation: Secondary | ICD-10-CM | POA: Diagnosis not present

## 2019-02-17 DIAGNOSIS — F250 Schizoaffective disorder, bipolar type: Secondary | ICD-10-CM | POA: Diagnosis not present

## 2019-02-17 MED ORDER — DILTIAZEM HCL ER COATED BEADS 120 MG PO CP24: 120.0000 mg | ORAL_CAPSULE | Freq: Every day | ORAL | 3 refills | Status: DC

## 2019-02-17 MED ORDER — METOPROLOL TARTRATE 25 MG PO TABS: 25.0000 mg | ORAL_TABLET | Freq: Two times a day (BID) | ORAL | 3 refills | Status: DC

## 2019-02-17 NOTE — Patient Instructions (Addendum)
Medication Instructions:  Your physician recommends that you continue on your current medications as directed. Please refer to the Current Medication list given to you today.  If you need a refill on your cardiac medications before your next appointment, please call your pharmacy.    Follow-Up: At [facility], you and your health needs are our priority.  As part of our continuing mission to provide you with exceptional heart care, we have created designated Provider Care Teams.  These Care Teams include your primary Cardiologist (physician) and Advanced Practice Providers (APPs -  Physician Assistants and Nurse Practitioners) who all work together to provide you with the care you need, when you need it. You will need a follow up appointment in 12 months.  Please call our office 2 months in advance to schedule this appointment.  You may see Thurmon Fair, MD or one of the following Advanced Practice Providers on your designated Care Team: [place], [place] . Micah Flesher, PA-C  Any Other Special Instructions Will Be Listed Below (If Applicable). Please contact our office at [phone] if you are having a heart rate that is ranging 110 or higher and if experiencing any neurological symptoms.

## 2019-02-17 NOTE — Progress Notes (Signed)
Virtual Visit via Video Note   This visit type was conducted due to national recommendations for restrictions regarding the COVID-19 Pandemic (e.g. social distancing) in an effort to limit this patient's exposure and mitigate transmission in our community.  Due to her co-morbid illnesses, this patient is at least at moderate risk for complications without adequate follow up.  This format is felt to be most appropriate for this patient at this time.  All issues noted in this document were discussed and addressed.  A limited physical exam was performed with this format.  Please refer to the patient's chart for her consent to telehealth for [facility].   Evaluation Performed:  Follow-up visit  Date:  02/17/2019   ID:  Kathy Brown, DOB Jul 31, 1981, MRN [id number]  Patient Location: Home Provider Location: Home  PCP:  [facility]  Cardiologist:  No primary care provider on file. New (seen by Lottie Mussel in hospital 2018) Electrophysiologist:  None   Chief Complaint:  atrial fibrillation follow up  History of Present Illness:    Kathy Brown is a 38 y.o. female with persistent atrial fibrillation, here for f/u after an ED visit in January. At that time she was mildly tachycardic, but there was suspicion for intercurrent illness (UTI).   In November, when she had anesthesia for dental extraction, she had an irregular rhythm with HR in 60s.  She has chronic psychiatric issues and her mother is intimately involved in her care, meds administration and helps with the review of systems.  The patient specifically denies any chest pain at rest exertion, dyspnea at rest or with exertion, orthopnea, paroxysmal nocturnal dyspnea, syncope, palpitations, focal neurological deficits, intermittent claudication, persistent lower extremity edema, unexplained weight gain, cough, hemoptysis or wheezing.  Occasional pedal/ankle edema is noted, but resolves with feet elevation.  The patient does have symptoms concerning for COVID-19 infection (fever, chills, cough, or new shortness of breath).    Past Medical History:  Diagnosis Date  . A-fib (HCC)   . Anxiety   . Atrial fibrillation with RVR (HCC) 10/15/2017  . Auditory hallucinations   . Chronic back pain   . Dysrhythmia    Atrial fibrillation  . Paranoid schizophrenia (HCC)   . Psychosis (HCC) 03/25/2014  . Schizoaffective disorder (HCC) 03/25/2014  . Schizophrenia (HCC)   . Urinary incontinence    Past Surgical History:  Procedure Laterality Date  . TOOTH EXTRACTION Bilateral 09/08/2018   Procedure: DENTAL RESTORATION/EXTRACTIONS;  Surgeon: Ocie Doyne, DDS;  Location: [facility];  Service: Oral Surgery;  Laterality: Bilateral;     Current Meds  Medication Sig  . diltiazem (CARDIZEM CD) 120 MG 24 hr capsule Take 1 capsule (120 mg total) by mouth daily.  Marland Kitchen FLUoxetine (PROZAC) 20 MG tablet Take 1 tablet (20 mg total) by mouth daily.  . haloperidol decanoate (HALDOL DECANOATE) 100 MG/ML injection Inject 1 mL into the muscle every 28 (twenty-eight) days.  . hydrOXYzine (ATARAX/VISTARIL) 25 MG tablet Take 1 tablet (25 mg total) by mouth 2 (two) times daily as needed for anxiety.  . hydrOXYzine (ATARAX/VISTARIL) 25 MG tablet Take 1 tablet (25 mg total) by mouth 2 (two) times daily.  . Melatonin 5 MG TBDP Place 5 mg under the tongue at bedtime.  . metoprolol tartrate (LOPRESSOR) 25 MG tablet Take 1 tablet (25 mg total) by mouth 2 (two) times daily.     Allergies:   Patient has no known allergies.   Social History   Tobacco  Use  . Smoking status: Never Smoker  . Smokeless tobacco: Never Used  Substance Use Topics  . Alcohol use: No  . Drug use: No     Family Hx: The patient's family history includes Diabetes in her mother; Hypertension in her mother.  ROS:   Please see the history of present illness.     All other systems reviewed and are negative.   Prior CV studies:    The following studies were reviewed today:  Echos 2018 and Nov 2019  Labs/Other Tests and Data Reviewed:    EKG:  An ECG dated 11/23/2018 was personally reviewed today and demonstrated:  atrial fibrillation with RVR, otherwise normal  Recent Labs: 09/09/2018: ALT 15; TSH 0.906 09/10/2018: Magnesium 1.9 11/22/2018: BUN 10; Creatinine, Ser 0.79; Hemoglobin 11.4; Platelets 299; Potassium 3.8; Sodium 137   Recent Lipid Panel No results found for: CHOL, TRIG, HDL, CHOLHDL, LDLCALC, LDLDIRECT  Wt Readings from Last 3 Encounters:  02/17/19 200 lb (90.7 kg)  09/10/18 219 lb 4.8 oz (99.5 kg)  07/09/18 225 lb 15.5 oz (102.5 kg)     Objective:    Vital Signs:  Ht 5\' 5"  (1.651 m)   Wt 200 lb (90.7 kg)   BMI 33.28 kg/m    VITAL SIGNS:  reviewed GEN:  no acute distress EYES:  sclerae anicteric, EOMI - Extraocular Movements Intact RESPIRATORY:  normal respiratory effort, symmetric expansion CARDIOVASCULAR:  no peripheral edema SKIN:  no rash, lesions or ulcers. MUSCULOSKELETAL:  no obvious deformities. NEURO:  alert and oriented x 3, no obvious focal deficit PSYCH:  flat affect, appears suspicious, but cooperative  ASSESSMENT & PLAN:    1. AFib: longstanding persistent, probably permanent (detected in 2018, November 2019 (at dental extraction) and January 2020. Normal EF on echo 2018 and Nov 2019. Normal atrial sizes. No valvular abnormalities. CHADSVasc 1 (gender only). Anticoagulation risks are higher than embolic risk. Do not recommend anticoagulation. Continue diltiazem and metoprolol. Diltiazem probably responsible for occasional ankle edema. Discussed the risk of beta blocker rebound - avoid abrupt withdrawal (this probably happened after her dental extraction). Discussed the risk of tachycardia cardiomyopathy/CHF if rate is poorly controlled.. 2. Schizoaffective disorder: her mother helps with med administration and will call us if she notices any focal neuro abnormalities. She also  agrees to periodically check [name]'s BP and HR with her automatic BP cuff. To call us if HR is >100.  COVID-19 Education: The signs and symptoms of COVID-19 were discussed with the patient and how to seek care for testing (follow up with PCP or arrange E-visit).  The importance of social distancing was discussed today.  Time:   Today, I have spent 17 minutes with the patient with telehealth technology discussing the above problems.     Medication Adjustments/Labs and Tests Ordered: Current medicines are reviewed at length with the patient today.  Concerns regarding medicines are outlined above.   Tests Ordered: No orders of the defined types were placed in this encounter.   Medication Changes: No orders of the defined types were placed in this encounter.   Disposition:  Follow up 12 months  Signed, Thurmon FairMihai Mekiah Cambridge, MD  02/17/2019 8:57 AM    [facility]

## 2019-11-01 DIAGNOSIS — Z03818 Encounter for observation for suspected exposure to other biological agents ruled out: Secondary | ICD-10-CM | POA: Diagnosis not present

## 2019-11-09 DIAGNOSIS — D8989 Other specified disorders involving the immune mechanism, not elsewhere classified: Secondary | ICD-10-CM | POA: Diagnosis not present

## 2019-11-09 DIAGNOSIS — M4802 Spinal stenosis, cervical region: Secondary | ICD-10-CM | POA: Diagnosis not present

## 2019-11-09 DIAGNOSIS — R600 Localized edema: Secondary | ICD-10-CM | POA: Diagnosis not present

## 2019-11-09 DIAGNOSIS — M545 Low back pain: Secondary | ICD-10-CM | POA: Diagnosis not present

## 2019-11-09 DIAGNOSIS — M791 Myalgia, unspecified site: Secondary | ICD-10-CM | POA: Diagnosis not present

## 2019-11-09 DIAGNOSIS — M25562 Pain in left knee: Secondary | ICD-10-CM | POA: Diagnosis not present

## 2019-11-09 DIAGNOSIS — R768 Other specified abnormal immunological findings in serum: Secondary | ICD-10-CM | POA: Diagnosis not present

## 2019-11-09 DIAGNOSIS — M2550 Pain in unspecified joint: Secondary | ICD-10-CM | POA: Diagnosis not present

## 2019-11-09 DIAGNOSIS — M25561 Pain in right knee: Secondary | ICD-10-CM | POA: Diagnosis not present

## 2019-11-09 DIAGNOSIS — M359 Systemic involvement of connective tissue, unspecified: Secondary | ICD-10-CM | POA: Diagnosis not present

## 2019-11-30 DIAGNOSIS — K5909 Other constipation: Secondary | ICD-10-CM | POA: Diagnosis not present

## 2020-01-04 DIAGNOSIS — K5900 Constipation, unspecified: Secondary | ICD-10-CM | POA: Diagnosis not present

## 2020-01-19 DIAGNOSIS — N390 Urinary tract infection, site not specified: Secondary | ICD-10-CM | POA: Diagnosis not present

## 2020-01-19 DIAGNOSIS — N760 Acute vaginitis: Secondary | ICD-10-CM | POA: Diagnosis not present

## 2020-02-15 DIAGNOSIS — Z03818 Encounter for observation for suspected exposure to other biological agents ruled out: Secondary | ICD-10-CM | POA: Diagnosis not present

## 2020-02-24 DIAGNOSIS — Z03818 Encounter for observation for suspected exposure to other biological agents ruled out: Secondary | ICD-10-CM | POA: Diagnosis not present

## 2020-06-19 DIAGNOSIS — I4891 Unspecified atrial fibrillation: Secondary | ICD-10-CM | POA: Diagnosis not present

## 2020-06-19 DIAGNOSIS — R609 Edema, unspecified: Secondary | ICD-10-CM | POA: Diagnosis not present

## 2020-07-12 NOTE — Progress Notes (Signed)
Cardiology Office Note   Date:  07/13/2020   ID:  Kathy Brown, DOB Oct 15, 1981, MRN [id number]  PCP:  [facility]  Cardiologist:  Thurmon Fair, MD EP: None  Chief Complaint  Patient presents with  . Follow-up    atrial fibrillation and LE edema      History of Present Illness: Kathy Brown is a 39 y.o. female with PMH of persistent atrial fibrillation and mental health challenges including schizophrenia with history of psychosis, PTSD, and anxiety, who presents for follow-up of her atrial fibrillation.  She was last evaluated by cardiology via a telemedicine visit with Dr. Royann Shivers 01/2019, at which time she was doing fine from a cardiac standpoint, noting occasional LE edema which resolved with feet elevation, otherwise no cardiac complaints. In regards to her atrial fibrillation, this has been a longstanding issue and felt to be likely permanent. Initially diagnosed in 2018 at the time of a dental extraction. It was felt that her risks for anticoagulation likely outweighed her embolic risk, therefore anticoagulation deferred. She has been on metoprolol and diltiazem for rate control. Her last echocardiogram 08/2018 showed EF 55-60% with no RWMA, and no significant valvular abnormalities.   She was recently evaluated by her PCP 06/19/20 and noted to have some LE edema. She was started on lasix 20mg  daily and referred back to cardiology for further evaluation/management.   She presents today for follow-up with her mother. They report recent issues obtaining her medications. I'm not sure I understand the situation completely, though it sounds as though her facility has not been administering medications due to outstanding bill for said medications. Unclear how long this has been going on. She does note some palpitations recently. Her primary complaint today is acid reflux. We discussed some OTC strategies, including tums and pepcid. No complaints of exertional  chest pain. She has some episodes of dizziness upon waking in the mornings but no pre-syncope or syncope. Also with some LE edema for which she was prescribe lasix 20mg  daily per chart review, though she does not recall this recommendation. No complaints of SOB, orthopnea, or PND.     Past Medical History:  Diagnosis Date  . A-fib (HCC)   . Anxiety   . Atrial fibrillation with RVR (HCC) 10/15/2017  . Auditory hallucinations   . Chronic back pain   . Dysrhythmia    Atrial fibrillation  . Paranoid schizophrenia (HCC)   . Psychosis (HCC) 03/25/2014  . Schizoaffective disorder (HCC) 03/25/2014  . Schizophrenia (HCC)   . Urinary incontinence     Past Surgical History:  Procedure Laterality Date  . TOOTH EXTRACTION Bilateral 09/08/2018   Procedure: DENTAL RESTORATION/EXTRACTIONS;  Surgeon: 03/27/2014, DDS;  Location: [facility];  Service: Oral Surgery;  Laterality: Bilateral;     Current Outpatient Medications  Medication Sig Dispense Refill  . diltiazem (CARDIZEM CD) 120 MG 24 hr capsule Take 1 capsule (120 mg total) by mouth daily. 90 capsule 3  . haloperidol decanoate (HALDOL DECANOATE) 100 MG/ML injection Inject 1 mL into the muscle every 28 (twenty-eight) days.  4  . hydrOXYzine (ATARAX/VISTARIL) 25 MG tablet Take 1 tablet (25 mg total) by mouth 2 (two) times daily as needed for anxiety. 30 tablet 0  . hydrOXYzine (ATARAX/VISTARIL) 25 MG tablet Take 1 tablet (25 mg total) by mouth 2 (two) times daily. 12 tablet 0  . Melatonin 5 MG TBDP Place 5 mg under the tongue at bedtime.  1  . metoprolol tartrate (  LOPRESSOR) 25 MG tablet Take 1 tablet (25 mg total) by mouth 2 (two) times daily. 180 tablet 3  . furosemide (LASIX) 20 MG tablet Take 1 tablet (20 mg total) by mouth daily. 90 tablet 3   No current facility-administered medications for this visit.    Allergies:   Patient has no known allergies.    Social History:  The patient  reports that she has never smoked.  She has never used smokeless tobacco. She reports that she does not drink alcohol and does not use drugs.   Family History:  The patient's family history includes Diabetes in her mother; Hypertension in her mother.    ROS:  Please see the history of present illness.   Otherwise, review of systems are positive for none.   All other systems are reviewed and negative.    PHYSICAL EXAM: VS:  BP (!) 153/103   Pulse (!) 109   Ht 5\' 4"  (1.626 m)   Wt 251 lb (113.9 kg)   SpO2 98%   BMI 43.08 kg/m  , BMI Body mass index is 43.08 kg/m. GEN: Well nourished, well developed, in no acute distress HEENT: sclera anicteric Neck: no JVD, carotid bruits, or masses Cardiac: IRIR; no murmurs, rubs, or gallops, trace LE edema  Respiratory:  clear to auscultation bilaterally, normal work of breathing GI: soft, obese, nontender, nondistended, + BS MS: no deformity or atrophy Skin: warm and dry, no rash Neuro:  Strength and sensation are intact Psych: flat affect   EKG:  EKG is ordered today. The ekg ordered today demonstrates atrial fibrillation with RVR, rate 109 bpm, non-specific T wave abnormalities, no STE/D   Recent Labs: No results found for requested labs within last 8760 hours.    Lipid Panel No results found for: CHOL, TRIG, HDL, CHOLHDL, VLDL, LDLCALC, LDLDIRECT    Wt Readings from Last 3 Encounters:  07/13/20 251 lb (113.9 kg)  02/17/19 200 lb (90.7 kg)  09/10/18 219 lb 4.8 oz (99.5 kg)      Other studies Reviewed: Additional studies/ records that were reviewed today include:   Echocardiogram 08/2018: - Left ventricle: The cavity size was normal. Wall thickness was  normal. Systolic function was normal. The estimated ejection  fraction was in the range of 55% to 60%. Wall motion was normal;  there were no regional wall motion abnormalities.     ASSESSMENT AND PLAN:  1. Chronic atrial fibrillation: EKG with atrial fibrillation with RVR with HR 109 today. Likely  she has been off her medications recently due to financial issues with her facility. Mother reports she will pick up Rx from an outside pharmacy if we submit refills - Will restart metoprolol tartrate 25mg  BID and diltiazem 120mg  daily for rate control. Instructed her to monitor HR and notify the office if not <100 as her metoprolol may need to be increased.  - CHA2DS2-VASc Score is likely 2 at this time (HTN and female), though risks of anticoagulation outweigh benefits  2. LE edema: Weight 251lb today, down from 259lbs at visit 06/19/20. LE edema improved, presumably with starting lasix 20mg  daily though unclear if she has been taking this recently due to medication issues - Can continue lasix 20mg  daily - Encouraged low sodium diet  3. HTN: BP 153/109 today. Likely in the setting of not being on her mediations recently - Restart metoprolol and diltiazem as above. Instructed her to keep a log of BP and notify the office if persistently >130/80 fo rfurther recommendations can be  made.   4. Schizophrenia with hx of psychosis: mother helps with care. Mood seems stable today. QTc 457 - stable on haldol - Continue management per psychiatry  Current medicines are reviewed at length with the patient today.  The patient does not have concerns regarding medicines.  The following changes have been made:  As above  Labs/ tests ordered today include:   Orders Placed This Encounter  Procedures  . EKG 12-Lead     Disposition:   FU with Dr. Royann Shivers  in 6 months  Signed, Beatriz Stallion, PA-C  07/13/2020 12:33 PM

## 2020-07-13 ENCOUNTER — Other Ambulatory Visit: Payer: Self-pay

## 2020-07-13 ENCOUNTER — Encounter: Payer: Self-pay | Admitting: Medical

## 2020-07-13 ENCOUNTER — Ambulatory Visit (INDEPENDENT_AMBULATORY_CARE_PROVIDER_SITE_OTHER): Payer: Medicare Other | Admitting: Medical

## 2020-07-13 VITALS — BP 153/103 | HR 109 | Ht 64.0 in | Wt 251.0 lb

## 2020-07-13 DIAGNOSIS — I10 Essential (primary) hypertension: Secondary | ICD-10-CM | POA: Diagnosis not present

## 2020-07-13 DIAGNOSIS — I4811 Longstanding persistent atrial fibrillation: Secondary | ICD-10-CM

## 2020-07-13 DIAGNOSIS — R600 Localized edema: Secondary | ICD-10-CM | POA: Diagnosis not present

## 2020-07-13 MED ORDER — FUROSEMIDE 20 MG PO TABS: 20.0000 mg | ORAL_TABLET | Freq: Every day | ORAL | 3 refills | Status: DC

## 2020-07-13 MED ORDER — DILTIAZEM HCL ER COATED BEADS 120 MG PO CP24: 120.0000 mg | ORAL_CAPSULE | Freq: Every day | ORAL | 3 refills | Status: DC

## 2020-07-13 MED ORDER — METOPROLOL TARTRATE 25 MG PO TABS: 25.0000 mg | ORAL_TABLET | Freq: Two times a day (BID) | ORAL | 3 refills | Status: DC

## 2020-07-13 NOTE — Patient Instructions (Signed)
Medication Instructions:  Continue Medications As Directed *If you need a refill on your cardiac medications before your next appointment, please call your pharmacy*   Lab Work: None If you have labs (blood work) drawn today and your tests are completely normal, you will receive your results only by: Marland Kitchen MyChart Message (if you have MyChart) OR . A paper copy in the mail If you have any lab test that is abnormal or we need to change your treatment, we will call you to review the results.   Testing/Procedures: None   Follow-Up: At [facility], you and your health needs are our priority.  As part of our continuing mission to provide you with exceptional heart care, we have created designated Provider Care Teams.  These Care Teams include your primary Cardiologist (physician) and Advanced Practice Providers (APPs -  Physician Assistants and Nurse Practitioners) who all work together to provide you with the care you need, when you need it.  We recommend signing up for the patient portal called "MyChart".  Sign up information is provided on this After Visit Summary.  MyChart is used to connect with patients for Virtual Visits (Telemedicine).  Patients are able to view lab/test results, encounter notes, upcoming appointments, etc.  Non-urgent messages can be sent to your provider as well.   To learn more about what you can do with MyChart, go to [email or web address].    Your next appointment:   6 month(s)  The format for your next appointment:   In Person  Provider:   Thurmon Fair, MD   Other Instructions Please notify if Blood Pressure above 130/80 and Heart Rate over 100

## 2020-07-13 NOTE — Progress Notes (Signed)
OK. We have never really met in person, I do not think

## 2020-10-16 IMAGING — DX DG CHEST 1V PORT
2 series · 2 of 2 positions shown · non-contrast
Comparison: 10/15/2017

CLINICAL DATA: Atrial fibrillation of for oral surgery today. Ice
packs about the face and jaw.

EXAM:
PORTABLE CHEST 1 VIEW

[chest ap (1 of 2)]
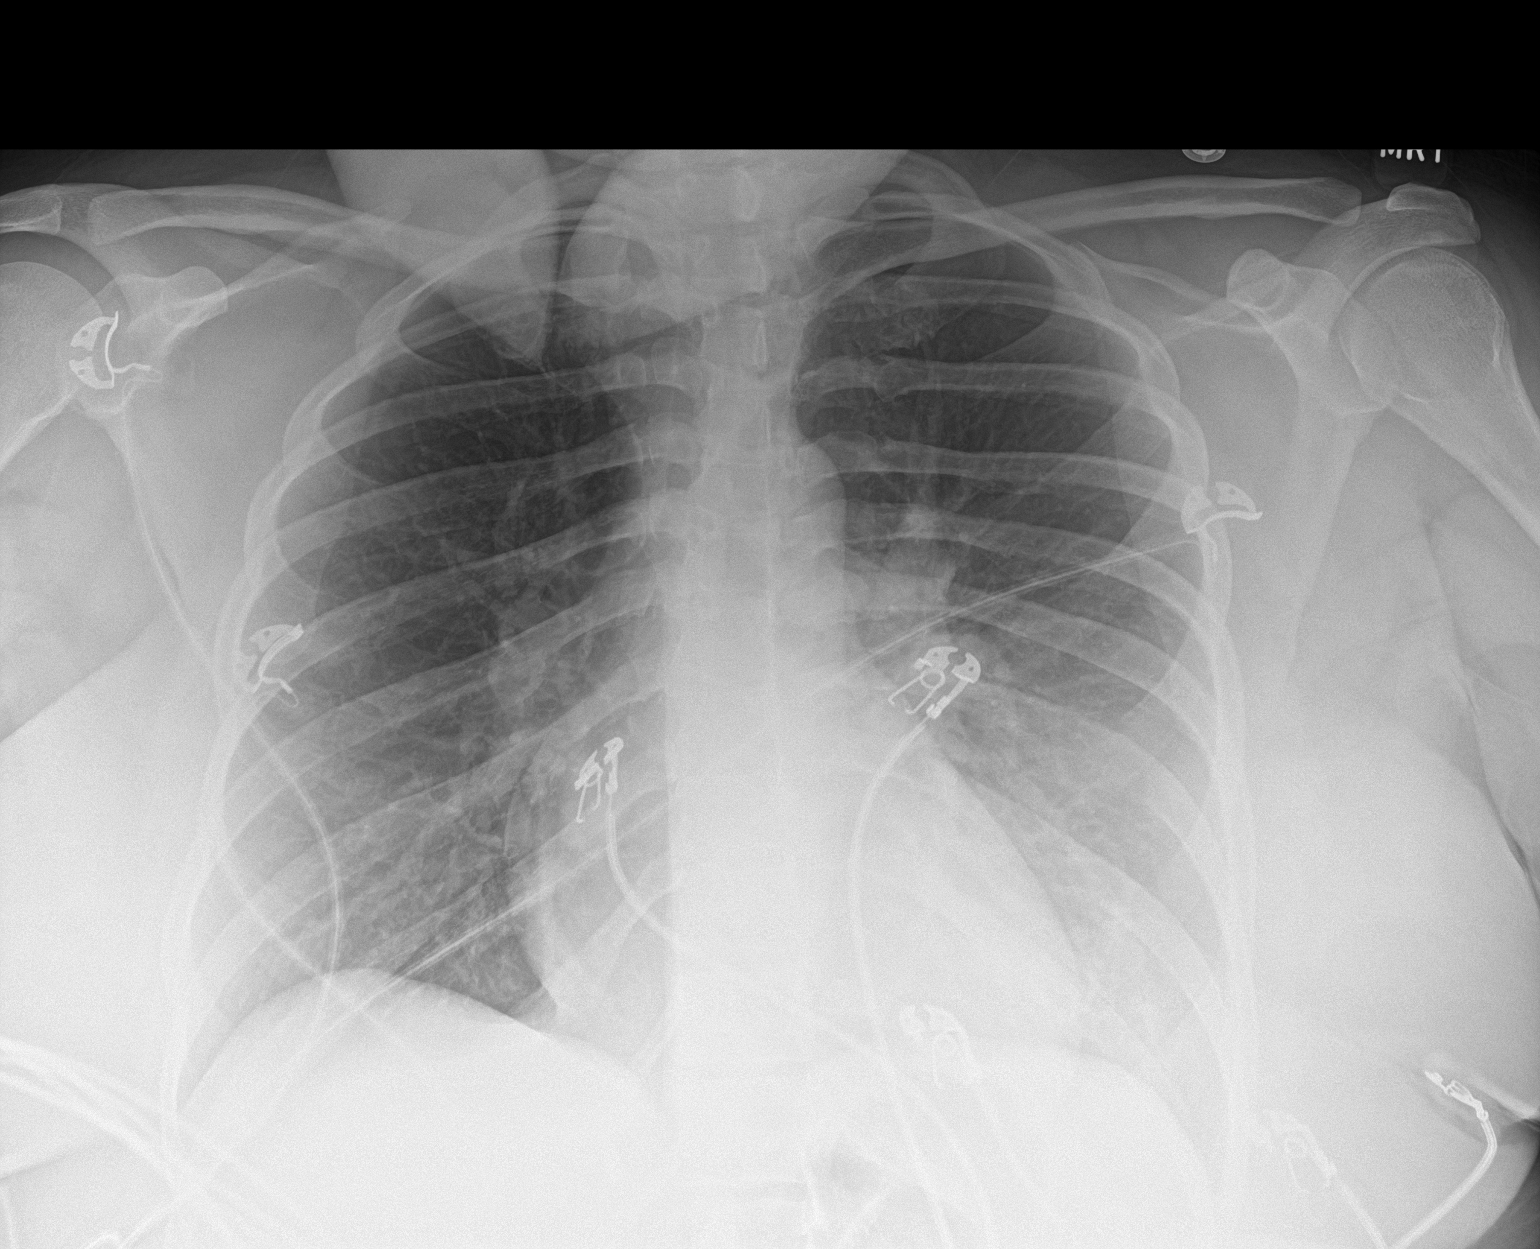

[chest ap (2 of 2)]
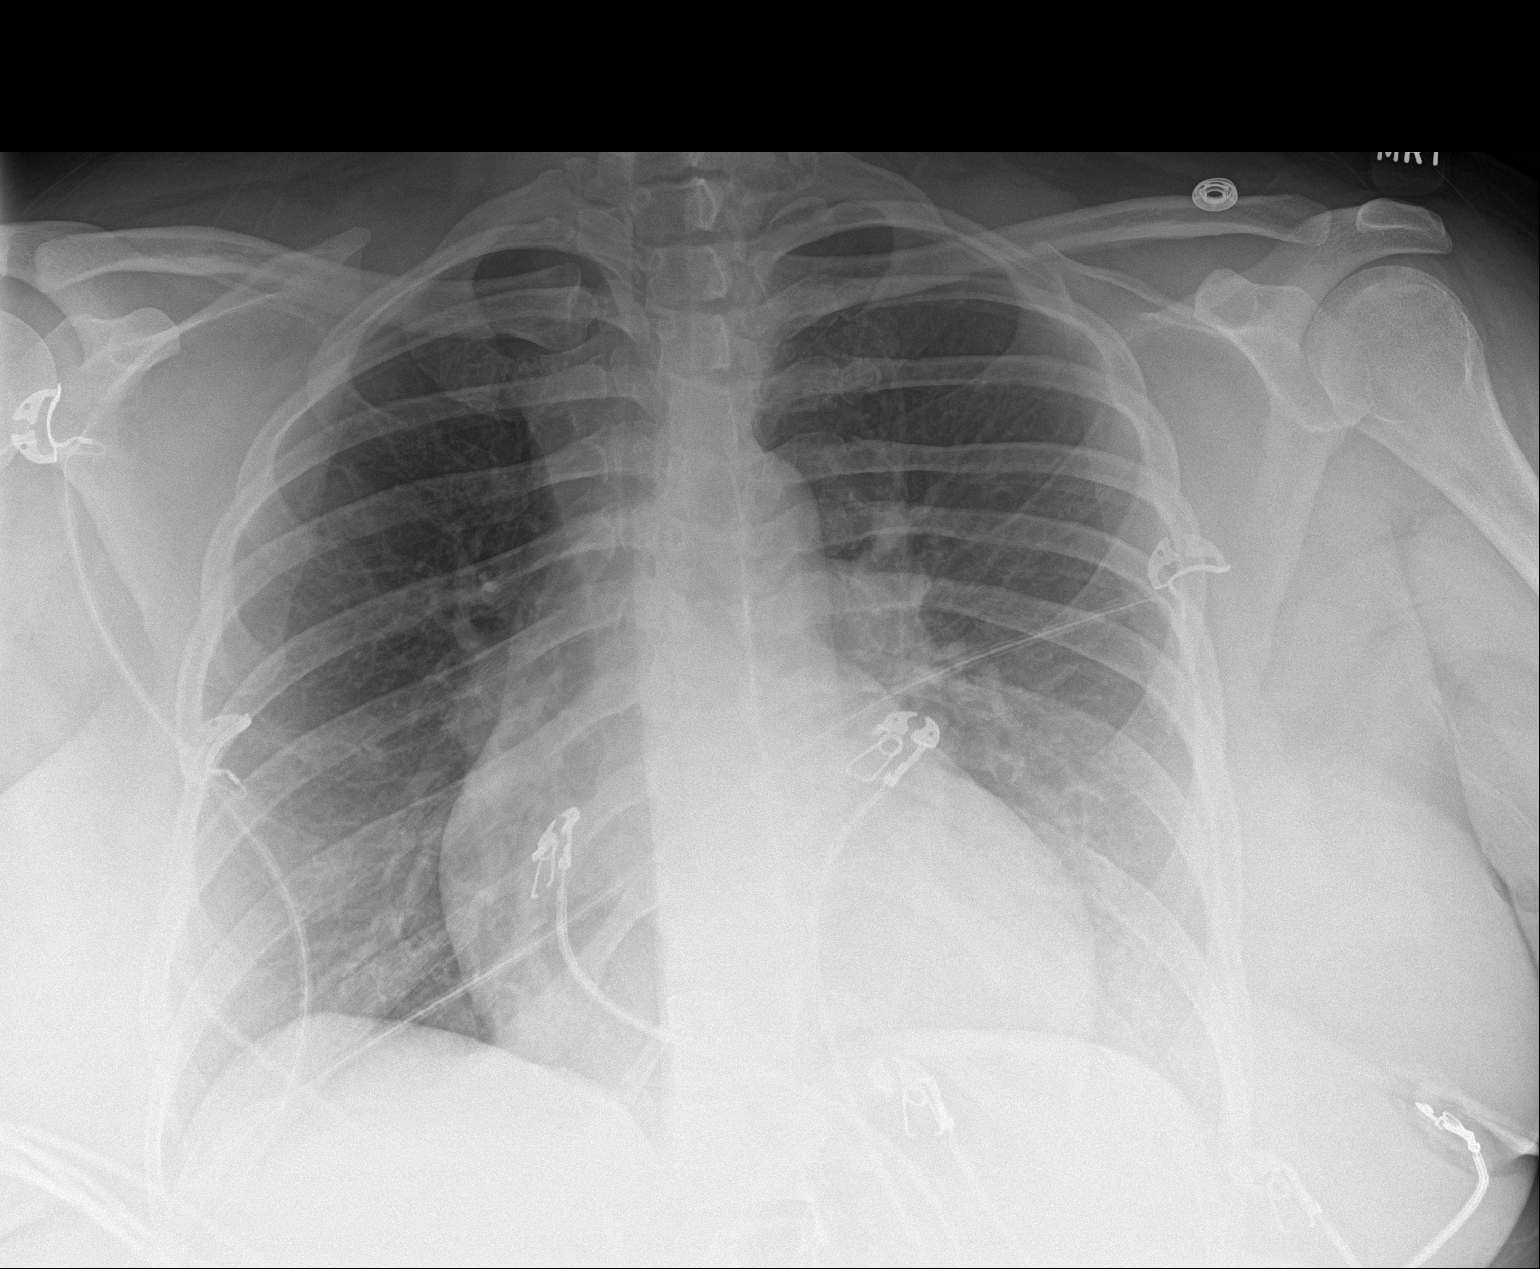

[2 of 2 positions shown; findings below may reference images not displayed]

FINDINGS: Normal heart size and mediastinal contours. Subtle opacities at the
left lung base may represent superimposition of the patient's left
breast shadow upon the left lung base. A subtle pneumonia is not
entirely excluded and clinical correlation is therefore recommended.
No effusion or pulmonary edema. No acute osseous abnormality.
IMPRESSION: Subtle opacity at the left lung base may reflect superimposition of
the patient's left breast shadow. Pneumonia is not entirely
excluded. Otherwise negative exam.

## 2021-01-07 ENCOUNTER — Encounter (HOSPITAL_COMMUNITY): Payer: Self-pay | Admitting: Registered Nurse

## 2021-01-07 ENCOUNTER — Ambulatory Visit (INDEPENDENT_AMBULATORY_CARE_PROVIDER_SITE_OTHER)
Admission: EM | Admit: 2021-01-07 | Discharge: 2021-01-07 | Disposition: A | Discharge status: Home / Self Care | Payer: Medicare Other

## 2021-01-07 ENCOUNTER — Emergency Department (HOSPITAL_COMMUNITY)
Admission: EM | Admit: 2021-01-07 | Discharge: 2021-01-08 | Disposition: A | Discharge status: Other Acute Inpatient Hospital | Payer: Medicare Other | Attending: Emergency Medicine | Admitting: Emergency Medicine

## 2021-01-07 ENCOUNTER — Encounter (HOSPITAL_COMMUNITY): Payer: Self-pay | Admitting: Emergency Medicine

## 2021-01-07 ENCOUNTER — Emergency Department (HOSPITAL_COMMUNITY): Payer: Medicare Other

## 2021-01-07 ENCOUNTER — Other Ambulatory Visit: Payer: Self-pay

## 2021-01-07 DIAGNOSIS — F200 Paranoid schizophrenia: Secondary | ICD-10-CM | POA: Insufficient documentation

## 2021-01-07 DIAGNOSIS — Z20822 Contact with and (suspected) exposure to covid-19: Secondary | ICD-10-CM | POA: Insufficient documentation

## 2021-01-07 DIAGNOSIS — I517 Cardiomegaly: Secondary | ICD-10-CM | POA: Diagnosis not present

## 2021-01-07 DIAGNOSIS — R404 Transient alteration of awareness: Secondary | ICD-10-CM | POA: Diagnosis not present

## 2021-01-07 DIAGNOSIS — H9209 Otalgia, unspecified ear: Secondary | ICD-10-CM | POA: Diagnosis not present

## 2021-01-07 DIAGNOSIS — R070 Pain in throat: Secondary | ICD-10-CM | POA: Insufficient documentation

## 2021-01-07 DIAGNOSIS — R0789 Other chest pain: Secondary | ICD-10-CM | POA: Diagnosis not present

## 2021-01-07 DIAGNOSIS — R109 Unspecified abdominal pain: Secondary | ICD-10-CM | POA: Diagnosis not present

## 2021-01-07 DIAGNOSIS — R443 Hallucinations, unspecified: Secondary | ICD-10-CM | POA: Diagnosis not present

## 2021-01-07 DIAGNOSIS — Z9114 Patient's other noncompliance with medication regimen: Secondary | ICD-10-CM | POA: Insufficient documentation

## 2021-01-07 DIAGNOSIS — R1084 Generalized abdominal pain: Secondary | ICD-10-CM | POA: Diagnosis not present

## 2021-01-07 DIAGNOSIS — R079 Chest pain, unspecified: Secondary | ICD-10-CM | POA: Diagnosis not present

## 2021-01-07 DIAGNOSIS — R52 Pain, unspecified: Secondary | ICD-10-CM | POA: Diagnosis not present

## 2021-01-07 DIAGNOSIS — F259 Schizoaffective disorder, unspecified: Secondary | ICD-10-CM | POA: Diagnosis not present

## 2021-01-07 DIAGNOSIS — R440 Auditory hallucinations: Secondary | ICD-10-CM | POA: Insufficient documentation

## 2021-01-07 DIAGNOSIS — F250 Schizoaffective disorder, bipolar type: Secondary | ICD-10-CM | POA: Insufficient documentation

## 2021-01-07 DIAGNOSIS — M7918 Myalgia, other site: Secondary | ICD-10-CM | POA: Diagnosis not present

## 2021-01-07 DIAGNOSIS — Z79899 Other long term (current) drug therapy: Secondary | ICD-10-CM | POA: Diagnosis not present

## 2021-01-07 DIAGNOSIS — G4489 Other headache syndrome: Secondary | ICD-10-CM | POA: Diagnosis not present

## 2021-01-07 LAB — HEMOGLOBIN A1C
Hgb A1c MFr Bld: 6.9 % — ABNORMAL HIGH (ref 4.8–5.6)
Mean Plasma Glucose: 151.33 mg/dL

## 2021-01-07 LAB — CBC WITH DIFFERENTIAL/PLATELET
Abs Immature Granulocytes: 0.01 10*3/uL (ref 0.00–0.07)
Basophils Absolute: 0.1 10*3/uL (ref 0.0–0.1)
Basophils Relative: 1 %
Eosinophils Absolute: 0 10*3/uL (ref 0.0–0.5)
Eosinophils Relative: 0 %
HCT: 37.6 % (ref 36.0–46.0)
Hemoglobin: 11.5 g/dL — ABNORMAL LOW (ref 12.0–15.0)
Immature Granulocytes: 0 %
Lymphocytes Relative: 21 %
Lymphs Abs: 1.2 10*3/uL (ref 0.7–4.0)
MCH: 24.8 pg — ABNORMAL LOW (ref 26.0–34.0)
MCHC: 30.6 g/dL (ref 30.0–36.0)
MCV: 81.2 fL (ref 80.0–100.0)
Monocytes Absolute: 0.4 10*3/uL (ref 0.1–1.0)
Monocytes Relative: 8 %
Neutro Abs: 4 10*3/uL (ref 1.7–7.7)
Neutrophils Relative %: 70 %
Platelets: 298 10*3/uL (ref 150–400)
RBC: 4.63 MIL/uL (ref 3.87–5.11)
RDW: 15.3 % (ref 11.5–15.5)
WBC: 5.7 10*3/uL (ref 4.0–10.5)
nRBC: 0 % (ref 0.0–0.2)

## 2021-01-07 LAB — LIPID PANEL
Cholesterol: 200 mg/dL (ref 0–200)
HDL: 41 mg/dL (ref >40)
LDL Cholesterol: 137 mg/dL — ABNORMAL HIGH (ref 0–99)
Total CHOL/HDL Ratio: 4.9 RATIO
Triglycerides: 109 mg/dL (ref <150)
VLDL: 22 mg/dL (ref 0–40)

## 2021-01-07 LAB — COMPREHENSIVE METABOLIC PANEL
ALT: 13 U/L (ref 0–44)
AST: 18 U/L (ref 15–41)
Albumin: 4.1 g/dL (ref 3.5–5.0)
Alkaline Phosphatase: 50 U/L (ref 38–126)
Anion gap: 9 (ref 5–15)
BUN: 8 mg/dL (ref 6–20)
CO2: 25 mmol/L (ref 22–32)
Calcium: 9.8 mg/dL (ref 8.9–10.3)
Chloride: 102 mmol/L (ref 98–111)
Creatinine, Ser: 0.81 mg/dL (ref 0.44–1.00)
GFR, Estimated: >60 mL/min (ref >60)
Glucose, Bld: 113 mg/dL — ABNORMAL HIGH (ref 70–99)
Potassium: 4.4 mmol/L (ref 3.5–5.1)
Sodium: 136 mmol/L (ref 135–145)
Total Bilirubin: 0.8 mg/dL (ref 0.3–1.2)
Total Protein: 7.4 g/dL (ref 6.5–8.1)

## 2021-01-07 LAB — BASIC METABOLIC PANEL
Anion gap: 7 (ref 5–15)
BUN: 7 mg/dL (ref 6–20)
CO2: 24 mmol/L (ref 22–32)
Calcium: 9.3 mg/dL (ref 8.9–10.3)
Chloride: 105 mmol/L (ref 98–111)
Creatinine, Ser: 0.8 mg/dL (ref 0.44–1.00)
GFR, Estimated: >60 mL/min (ref >60)
Glucose, Bld: 92 mg/dL (ref 70–99)
Potassium: 4 mmol/L (ref 3.5–5.1)
Sodium: 136 mmol/L (ref 135–145)

## 2021-01-07 LAB — CBC
HCT: 37.3 % (ref 36.0–46.0)
Hemoglobin: 11.4 g/dL — ABNORMAL LOW (ref 12.0–15.0)
MCH: 24.9 pg — ABNORMAL LOW (ref 26.0–34.0)
MCHC: 30.6 g/dL (ref 30.0–36.0)
MCV: 81.6 fL (ref 80.0–100.0)
Platelets: 285 10*3/uL (ref 150–400)
RBC: 4.57 MIL/uL (ref 3.87–5.11)
RDW: 15.4 % (ref 11.5–15.5)
WBC: 6.3 10*3/uL (ref 4.0–10.5)
nRBC: 0 % (ref 0.0–0.2)

## 2021-01-07 LAB — RESP PANEL BY RT-PCR (FLU A&B, COVID) ARPGX2
Influenza A by PCR: NEGATIVE
Influenza B by PCR: NEGATIVE
SARS Coronavirus 2 by RT PCR: NEGATIVE

## 2021-01-07 LAB — I-STAT BETA HCG BLOOD, ED (MC, WL, AP ONLY): I-stat hCG, quantitative: <5 m[IU]/mL (ref <5)

## 2021-01-07 LAB — TSH: TSH: 1.601 u[IU]/mL (ref 0.350–4.500)

## 2021-01-07 LAB — MAGNESIUM: Magnesium: 2.1 mg/dL (ref 1.7–2.4)

## 2021-01-07 LAB — PROTIME-INR
INR: 1 (ref 0.8–1.2)
Prothrombin Time: 13.2 seconds (ref 11.4–15.2)

## 2021-01-07 LAB — POC SARS CORONAVIRUS 2 AG -  ED: SARS Coronavirus 2 Ag: NEGATIVE

## 2021-01-07 LAB — ETHANOL: Alcohol, Ethyl (B): <10 mg/dL (ref <10)

## 2021-01-07 LAB — LIPASE, BLOOD: Lipase: 26 U/L (ref 11–51)

## 2021-01-07 LAB — TROPONIN I (HIGH SENSITIVITY): Troponin I (High Sensitivity): 5 ng/L (ref <18)

## 2021-01-07 MED ORDER — FLUOXETINE HCL 20 MG/5ML PO SOLN
20.0000 mg | Freq: Every day | ORAL | Status: DC
  Filled 2021-01-07 (×4): qty 5

## 2021-01-07 MED ORDER — METOPROLOL TARTRATE 25 MG PO TABS: 25.0000 mg | ORAL_TABLET | Freq: Two times a day (BID) | ORAL | Status: DC

## 2021-01-07 MED ORDER — HALOPERIDOL LACTATE 2 MG/ML PO CONC: 2.0000 mg | Freq: Every day | ORAL | Status: DC

## 2021-01-07 MED ORDER — ACETAMINOPHEN 325 MG PO TABS
650.0000 mg | ORAL_TABLET | ORAL | Status: DC | PRN
  Administered 2021-01-08: 650 mg via ORAL
  Filled 2021-01-07: qty 2

## 2021-01-07 MED ORDER — DILTIAZEM HCL ER COATED BEADS 120 MG PO CP24
120.0000 mg | ORAL_CAPSULE | Freq: Every day | ORAL | Status: DC
  Administered 2021-01-07: 120 mg via ORAL
  Filled 2021-01-07: qty 1

## 2021-01-07 MED ORDER — ALUM & MAG HYDROXIDE-SIMETH 200-200-20 MG/5ML PO SUSP
15.0000 mL | Freq: Once | ORAL | Status: AC
  Administered 2021-01-07: 15 mL via ORAL
  Filled 2021-01-07: qty 30

## 2021-01-07 MED ORDER — ALUM & MAG HYDROXIDE-SIMETH 200-200-20 MG/5ML PO SUSP: 30.0000 mL | Freq: Four times a day (QID) | ORAL | Status: DC | PRN

## 2021-01-07 MED ORDER — CHLORHEXIDINE GLUCONATE 0.12 % MT SOLN
15.0000 mL | Freq: Two times a day (BID) | OROMUCOSAL | Status: DC
  Filled 2021-01-07 (×2): qty 15

## 2021-01-07 MED ORDER — ACETAMINOPHEN 500 MG PO TABS
1000.0000 mg | ORAL_TABLET | Freq: Once | ORAL | Status: AC
  Administered 2021-01-07: 1000 mg via ORAL
  Filled 2021-01-07: qty 2

## 2021-01-07 MED ORDER — HALOPERIDOL LACTATE 2 MG/ML PO CONC
5.0000 mg | Freq: Two times a day (BID) | ORAL | Status: DC
  Filled 2021-01-07 (×3): qty 2.5

## 2021-01-07 MED ORDER — ASPIRIN 325 MG PO TABS
ORAL_TABLET | ORAL | Status: AC
  Filled 2021-01-07: qty 1

## 2021-01-07 MED ORDER — POLYETHYLENE GLYCOL 3350 17 G PO PACK: 17.0000 g | PACK | Freq: Every day | ORAL | Status: DC

## 2021-01-07 MED ORDER — ASPIRIN 325 MG PO TABS
325.0000 mg | ORAL_TABLET | ORAL | Status: AC
  Administered 2021-01-07: 325 mg via ORAL

## 2021-01-07 NOTE — ED Provider Notes (Addendum)
[facility] Provider Note   CSN: [id number] Arrival date & time: 01/07/21  1925     History Chief Complaint  Patient presents with  . Chest Pain  . Abdominal Pain    Kathy Brown is a 40 y.o. female presenting for evaluation of generalized pain.  Level 5 caveat due to psychiatric condition.  Patient states she is having pain all over.  She reports chest pain, abdominal pain, ear pain, throat pain.  She states this has been going on since "she moved."  She cannot tell me exactly when this happened, but states it was several weeks ago.  She cannot report if anything is different or new today.  She has not taken anything for this.  She states that she has not been receiving any of her medications, including her heart or behavioral health medications.  Patient continues to report auditory and visual hallucinations, but would not describe what they are.  She denies suicidal or homicidal intent.  Per chart review, she became aggressive with day center staff today, which is not her typical. Additionally, pt has been off   Additional history obtained from chart review.  Patient coming from behavioral health center where she is pending placement for inpatient psychiatric evaluation.  She reported pain today, which did not improve with aspirin.  As such, she was sent to the ER for medical clearance.  Per chart review, she has a history of persistent A. fib, not on anticoagulation due to risks outweighing benefits.  Also has a history of leg swelling and is supposed be on Lasix.  Per chart review, patient is supposed to be on diltiazem, metoprolol, Lasix, and Risperdal and Prozac.  Unsure if patient is receiving any of these medications.  HPI     Past Medical History:  Diagnosis Date  . A-fib (HCC)   . Anxiety   . Atrial fibrillation with RVR (HCC) 10/15/2017  . Auditory hallucinations   . Chronic back pain   . Dysrhythmia    Atrial fibrillation  .  Paranoid schizophrenia (HCC)   . Psychosis (HCC) 03/25/2014  . Schizoaffective disorder (HCC) 03/25/2014  . Schizophrenia (HCC)   . Urinary incontinence     Patient Active Problem List   Diagnosis Date Noted  . A-fib (HCC) 09/08/2018  . Atrial fibrillation with RVR (HCC) 10/15/2017  . Auditory hallucinations   . Paranoid schizophrenia (HCC)   . Schizoaffective disorder (HCC) 03/25/2014  . Psychosis (HCC) 03/25/2014    Past Surgical History:  Procedure Laterality Date  . TOOTH EXTRACTION Bilateral 09/08/2018   Procedure: DENTAL RESTORATION/EXTRACTIONS;  Surgeon: Ocie DoyneJensen, Scott, DDS;  Location: [facility];  Service: Oral Surgery;  Laterality: Bilateral;     OB History    Gravida      Para      Term      Preterm      AB      Living  3     SAB      IAB      Ectopic      Multiple      Live Births              Family History  Problem Relation Age of Onset  . Hypertension Mother   . Diabetes Mother     Social History   Tobacco Use  . Smoking status: Never Smoker  . Smokeless tobacco: Never Used  Vaping Use  . Vaping Use: Never used  Substance Use Topics  .  Alcohol use: No  . Drug use: No    Home Medications Prior to Admission medications   Medication Sig Start Date End Date Taking? Authorizing Provider  chlorhexidine (PERIDEX) 0.12 % solution Use as directed 15 mLs in the mouth or throat 2 (two) times daily.    [provider]  diltiazem (DILTIAZEM CD) 120 MG 24 hr capsule Take 120 mg by mouth daily.    [provider]  FLUoxetine (PROZAC) 20 MG/5ML solution Take 20 mg by mouth daily.    [provider]  furosemide (LASIX) 20 MG tablet Take 1 tablet (20 mg total) by mouth daily. 07/13/20 10/11/20  Kroeger, Ovidio Kin., PA-C  metoprolol tartrate (LOPRESSOR) 25 MG tablet Take 1 tablet (25 mg total) by mouth 2 (two) times daily. 07/13/20   Kroeger, Ovidio Kin., PA-C  polyethylene glycol (MIRALAX / GLYCOLAX) 17 g  packet Take 17 g by mouth daily.    [provider]  risperiDONE (RISPERDAL) 2 MG tablet Take 2 mg by mouth daily.    [provider]    Allergies    Patient has no known allergies.  Review of Systems   Review of Systems  Unable to perform ROS: Psychiatric disorder  HENT: Positive for ear pain and sore throat.   Cardiovascular: Positive for chest pain.  Musculoskeletal: Positive for myalgias.    Physical Exam Updated Vital Signs BP 136/83 (BP Location: Left Arm)   Pulse 100   Temp 98.8 F (37.1 C) (Oral)   Resp 19   Ht 5\' 5"  (1.651 m)   Wt 115 kg   SpO2 100%   BMI 42.19 kg/m   Physical Exam Vitals and nursing note reviewed.  Constitutional:      General: She is not in acute distress.    Appearance: She is well-developed.     Comments: Resting in the bed in NAD  HENT:     Head: Normocephalic and atraumatic.  Eyes:     Extraocular Movements: Extraocular movements intact.     Conjunctiva/sclera: Conjunctivae normal.     Pupils: Pupils are equal, round, and reactive to light.  Cardiovascular:     Rate and Rhythm: Normal rate. Rhythm irregular.     Pulses: Normal pulses.     Comments: Irregularly irregular. No tachycardia Pulmonary:     Effort: Pulmonary effort is normal. No respiratory distress.     Breath sounds: Normal breath sounds. No wheezing.     Comments: Clear lung sounds in all fields. Speaking in full sentences. Diffuse ttp of the entire chest wall anteriorly and posteriorly.  Abdominal:     General: There is no distension.     Palpations: Abdomen is soft. There is no mass.     Tenderness: There is no abdominal tenderness. There is no guarding or rebound.  Musculoskeletal:        General: Normal range of motion.     Cervical back: Normal range of motion and neck supple.     Right lower leg: No edema.     Left lower leg: No edema.     Comments: No pitting edema noted on my exam  Skin:    General: Skin is warm and dry.     Capillary  Refill: Capillary refill takes less than 2 seconds.  Neurological:     Mental Status: She is alert and oriented to person, place, and time.  Psychiatric:        Attention and Perception: She perceives auditory and visual hallucinations.  Speech: She is noncommunicative.        Behavior: Behavior is withdrawn.        Thought Content: Thought content does not include homicidal or suicidal ideation. Thought content does not include homicidal or suicidal plan.     ED Results / Procedures / Treatments   Labs (all labs ordered are listed, but only abnormal results are displayed) Labs Reviewed  CBC - Abnormal; Notable for the following components:      Result Value   Hemoglobin 11.4 (*)    MCH 24.9 (*)    All other components within normal limits  BASIC METABOLIC PANEL  LIPASE, BLOOD  PROTIME-INR  I-STAT BETA HCG BLOOD, ED ([facility], [facility], [facility] ONLY)  TROPONIN I (HIGH SENSITIVITY)  TROPONIN I (HIGH SENSITIVITY)    EKG None  Radiology DG Chest 2 View  Result Date: 01/07/2021 CLINICAL DATA:  Chest pain EXAM: CHEST - 2 VIEW COMPARISON:  11/22/2018 FINDINGS: Low lung volumes. No consolidation or edema. No pleural effusion or pneumothorax. Cardiomegaly. No acute osseous abnormality. IMPRESSION: Cardiomegaly. Electronically Signed   By: Guadlupe Spanish M.D.   On: 01/07/2021 19:58    Procedures Procedures   Medications Ordered in ED Medications  alum & mag hydroxide-simeth (MAALOX/MYLANTA) 200-200-20 MG/5ML suspension 15 mL (15 mLs Oral Given 01/07/21 2247)  acetaminophen (TYLENOL) tablet 1,000 mg (1,000 mg Oral Given 01/07/21 2247)    ED Course  I have reviewed the triage vital signs and the nursing notes.  Pertinent labs & imaging results that were available during my care of the patient were reviewed by me and considered in my medical decision making (see chart for details).    MDM Rules/Calculators/A&P                          Patient presenting for evaluation of pain, which per  patient has been present for several weeks.  Nothing is new or different today.  She has pain in multiple locations, including ear, throat, chest, abd. on exam, patient appears nontoxic.  Pain is reproducible with palpation anywhere over her body.  In the setting of likely chronic pain, I have low suspicion for ACS.  Labs obtained from triage interpreted by me, overall reassuring.  Electrolytes stable.  Initial troponin negative.  EKG shows A. fib, patient has a history of permanent A. fib.  Chest x-ray viewed interpreted by me, does show cardiomegaly, but no obvious effusion or pneumonia.  Will treat pain symptomatically.  Patient refusing repeat troponin.  I have low suspicion for ACS, especially as initial troponin was negative.  She is not under IVC, I do not believe repeat would be beneficial at this time.  I believe her psychiatric condition is playing a large role in her complaints of pain today.  It was recommended that she remain in the hospital for inpatient behavioral health placement, as such, patient will remain in the ER.  At this time, patient is medically cleared for psychiatric management.  The patient has been placed in psychiatric observation due to the need to provide a safe environment for the patient while obtaining psychiatric consultation and evaluation, as well as ongoing medical and medication management to treat the patient's condition.  The patient has not been placed under full IVC at this time.  0245: Patient complaining of continued abdominal pain.  On reevaluation, she reports this is the same pain she has had all day.  No focal tenderness on my exam.  However as  she cannot provide a reliable history, and has continued pain, will obtain CT to ensure no concerning intra-abdominal infections or surgical emergencies.  Ativan and Zofran given for symptom control.  CT abdomen pelvis negative for acute findings.  Patient symptoms are improved with Zofran and Ativan.  I feel patient  psychiatric condition is likely contributing to her symptoms.  Will continue to hold in the ED for inpatient psychiatric placement   Final Clinical Impression(s) / ED Diagnoses Final diagnoses:  Generalized pain  Hallucinations    Rx / DC Orders ED Discharge Orders    None       Alveria Apley, PA-C 01/07/21 2319    Alveria Apley, PA-C 01/08/21 0415    [name], MD 01/08/21 [phone]

## 2021-01-07 NOTE — ED Notes (Signed)
Patient continues to murmer out load. Patient responding to internal stimuli. Patient is lying down with no pressured or labored breathing.

## 2021-01-07 NOTE — ED Notes (Signed)
Patient came from [facility] for medical clearance after having chest pain.

## 2021-01-07 NOTE — ED Notes (Signed)
Pt refused labs.  

## 2021-01-07 NOTE — Progress Notes (Signed)
Patient presents with AMS (in [place]) brought in by [facility] after patient became agitated in her day program at [employer]. Patient states she is currently experiencing active AVH although is vague in reference to content. Patient is observed to be tearful and appears to be responding to [facility] Iv as evidenced by patient having a conversation with "someone named Jonny Ruiz." Patient denies any S/I or H/I although this writer is uncertain if patient is processing the content of this writer's questions. Patient is oriented to person only.

## 2021-01-07 NOTE — ED Triage Notes (Addendum)
Patient arrived with EMS from [facility] , staff reported patient complained of central chest pain and generalized abdominal pain today , no emesis or diarrhea , respirations unlabored. She received ASA 324 mg by EMS prior to arrival .

## 2021-01-07 NOTE — ED Notes (Signed)
Pt complaining of chest pains. Abnormal EKG. MD orders to send to [facility] for medical clearance. Pt made aware. Report given to Maralyn Sago, Press photographer at [facility]. Transport called.

## 2021-01-07 NOTE — ED Notes (Signed)
Pt admitted to overnight observation due to paranoid, bizarre behaviors. Pt poor historian, confused state of mind, responding to internal stimuli and mumbling, slurred speech. Pt defecated in UDS cup when asked for a urine sample. Pt was encouraged to take a shower as she defecated on self. Compliant with staff request. Will monitor for safety.

## 2021-01-07 NOTE — ED Notes (Signed)
Patient complaining of chest pains. Renae RN took vitals. B/P was given to [facility] NP. Patient was given medication by Ryta RN. Patient is lying down RN discussing concerns with patient.

## 2021-01-07 NOTE — ED Notes (Signed)
Patient contiuned to complain of chest pain. EMS was called to transport patient to ER.

## 2021-01-07 NOTE — Progress Notes (Signed)
Pt is still complaining of chest after medication intervention and also general body pain that pt discribes as sharp pain. V/S checked and documented in the flow sheet. Provider notified. Pt is to be transferred to [facility] for medication clearance. Staff will monitor for pt's safety.
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
## 2021-01-07 NOTE — Progress Notes (Signed)
Pt transferred to [facility] via EMS.

## 2021-01-08 ENCOUNTER — Emergency Department (HOSPITAL_COMMUNITY): Payer: Medicare Other

## 2021-01-08 ENCOUNTER — Telehealth (HOSPITAL_COMMUNITY): Payer: Self-pay | Admitting: General Practice

## 2021-01-08 ENCOUNTER — Encounter (HOSPITAL_COMMUNITY): Payer: Self-pay | Admitting: Psychiatry

## 2021-01-08 ENCOUNTER — Inpatient Hospital Stay (HOSPITAL_COMMUNITY)
Admission: AD | Admit: 2021-01-08 | Discharge: 2021-01-14 | DRG: 885 | Disposition: A | Discharge status: Home / Self Care | Payer: Medicare Other | Source: Intra-hospital | Attending: Psychiatry | Admitting: Psychiatry

## 2021-01-08 ENCOUNTER — Other Ambulatory Visit: Payer: Self-pay | Admitting: Psychiatry

## 2021-01-08 DIAGNOSIS — F200 Paranoid schizophrenia: Principal | ICD-10-CM | POA: Diagnosis present

## 2021-01-08 DIAGNOSIS — Z79899 Other long term (current) drug therapy: Secondary | ICD-10-CM | POA: Diagnosis not present

## 2021-01-08 DIAGNOSIS — G4700 Insomnia, unspecified: Secondary | ICD-10-CM | POA: Diagnosis present

## 2021-01-08 DIAGNOSIS — D649 Anemia, unspecified: Secondary | ICD-10-CM | POA: Diagnosis present

## 2021-01-08 DIAGNOSIS — F79 Unspecified intellectual disabilities: Secondary | ICD-10-CM | POA: Diagnosis present

## 2021-01-08 DIAGNOSIS — R6250 Unspecified lack of expected normal physiological development in childhood: Secondary | ICD-10-CM | POA: Diagnosis present

## 2021-01-08 DIAGNOSIS — I4891 Unspecified atrial fibrillation: Secondary | ICD-10-CM | POA: Diagnosis present

## 2021-01-08 DIAGNOSIS — F259 Schizoaffective disorder, unspecified: Secondary | ICD-10-CM | POA: Diagnosis present

## 2021-01-08 DIAGNOSIS — Z8249 Family history of ischemic heart disease and other diseases of the circulatory system: Secondary | ICD-10-CM | POA: Diagnosis not present

## 2021-01-08 DIAGNOSIS — F419 Anxiety disorder, unspecified: Secondary | ICD-10-CM | POA: Diagnosis present

## 2021-01-08 DIAGNOSIS — I10 Essential (primary) hypertension: Secondary | ICD-10-CM | POA: Diagnosis present

## 2021-01-08 DIAGNOSIS — Z91120 Patient's intentional underdosing of medication regimen due to financial hardship: Secondary | ICD-10-CM | POA: Diagnosis not present

## 2021-01-08 DIAGNOSIS — R109 Unspecified abdominal pain: Secondary | ICD-10-CM | POA: Diagnosis not present

## 2021-01-08 DIAGNOSIS — F29 Unspecified psychosis not due to a substance or known physiological condition: Secondary | ICD-10-CM | POA: Diagnosis present

## 2021-01-08 MED ORDER — IOHEXOL 300 MG/ML  SOLN
100.0000 mL | Freq: Once | INTRAMUSCULAR | Status: AC | PRN
  Administered 2021-01-08: 100 mL via INTRAVENOUS

## 2021-01-08 MED ORDER — DILTIAZEM HCL ER COATED BEADS 120 MG PO CP24
120.0000 mg | ORAL_CAPSULE | Freq: Every day | ORAL | Status: DC
  Administered 2021-01-09 – 2021-01-13 (×5): 120 mg via ORAL
  Filled 2021-01-08 (×8): qty 1

## 2021-01-08 MED ORDER — BENZTROPINE MESYLATE 0.5 MG PO TABS
0.5000 mg | ORAL_TABLET | Freq: Two times a day (BID) | ORAL | Status: DC
  Administered 2021-01-08 – 2021-01-11 (×6): 0.5 mg via ORAL
  Filled 2021-01-08 (×12): qty 1

## 2021-01-08 MED ORDER — MAGNESIUM HYDROXIDE 400 MG/5ML PO SUSP
30.0000 mL | Freq: Every day | ORAL | Status: DC
  Administered 2021-01-08 – 2021-01-14 (×6): 30 mL via ORAL
  Filled 2021-01-08 (×5): qty 30

## 2021-01-08 MED ORDER — RISPERIDONE 2 MG PO TBDP
2.0000 mg | ORAL_TABLET | Freq: Every day | ORAL | Status: DC
  Administered 2021-01-08 – 2021-01-09 (×2): 2 mg via ORAL
  Filled 2021-01-08 (×5): qty 1

## 2021-01-08 MED ORDER — METOPROLOL TARTRATE 25 MG PO TABS
25.0000 mg | ORAL_TABLET | Freq: Two times a day (BID) | ORAL | Status: DC
  Administered 2021-01-09 – 2021-01-13 (×7): 25 mg via ORAL
  Filled 2021-01-08 (×14): qty 1

## 2021-01-08 MED ORDER — HYDROXYZINE HCL 25 MG PO TABS
25.0000 mg | ORAL_TABLET | Freq: Three times a day (TID) | ORAL | Status: DC | PRN
  Administered 2021-01-09 – 2021-01-10 (×2): 25 mg via ORAL
  Filled 2021-01-08: qty 1

## 2021-01-08 MED ORDER — DILTIAZEM HCL 60 MG PO TABS: 120.0000 mg | ORAL_TABLET | Freq: Every day | ORAL | Status: DC

## 2021-01-08 MED ORDER — METOPROLOL TARTRATE 25 MG PO TABS
25.0000 mg | ORAL_TABLET | Freq: Two times a day (BID) | ORAL | Status: DC
  Administered 2021-01-08: 25 mg via ORAL
  Filled 2021-01-08: qty 1

## 2021-01-08 MED ORDER — DILTIAZEM HCL ER COATED BEADS 120 MG PO CP24
120.0000 mg | ORAL_CAPSULE | Freq: Every day | ORAL | Status: DC
  Administered 2021-01-08: 120 mg via ORAL
  Filled 2021-01-08: qty 1

## 2021-01-08 MED ORDER — ALUM & MAG HYDROXIDE-SIMETH 200-200-20 MG/5ML PO SUSP: 30.0000 mL | Freq: Four times a day (QID) | ORAL | Status: DC | PRN

## 2021-01-08 MED ORDER — INFLUENZA VAC SPLIT QUAD 0.5 ML IM SUSY
0.5000 mL | PREFILLED_SYRINGE | INTRAMUSCULAR | Status: DC
  Filled 2021-01-08: qty 0.5

## 2021-01-08 MED ORDER — TRAZODONE HCL 50 MG PO TABS
50.0000 mg | ORAL_TABLET | Freq: Every evening | ORAL | Status: DC | PRN
  Administered 2021-01-08 – 2021-01-13 (×6): 50 mg via ORAL
  Filled 2021-01-08 (×15): qty 1

## 2021-01-08 MED ORDER — ONDANSETRON HCL 4 MG/2ML IJ SOLN
4.0000 mg | Freq: Once | INTRAMUSCULAR | Status: AC
  Administered 2021-01-08: 4 mg via INTRAVENOUS
  Filled 2021-01-08: qty 2

## 2021-01-08 MED ORDER — LORAZEPAM 2 MG/ML IJ SOLN
1.0000 mg | Freq: Once | INTRAMUSCULAR | Status: AC
  Administered 2021-01-08: 1 mg via INTRAVENOUS
  Filled 2021-01-08: qty 1

## 2021-01-08 NOTE — Progress Notes (Signed)
Adult Psychoeducational Group Note  Date:  01/08/2021 Time:  11:20 PM  Group Topic/Focus:  Wrap-Up Group:   The focus of this group is to help patients review their daily goal of treatment and discuss progress on daily workbooks.  Participation Level:  Active  Participation Quality:  Appropriate  Affect:  Appropriate  Cognitive:  Disorganized  Insight: Limited  Engagement in Group:  Limited  Modes of Intervention:  Discussion  Additional Comments:  Pt is a new admission.  Pt stated her goal for today was to focus on her treatment plan and talk with her doctor about her medication issue. Pt stated she felt she accomplished her goals today. Pt stated she did not get a chance to  talk with her doctor or social worker, regarding her care today. Pt stated she wanted to talk with her doctor because she has been off her medication for a while. Writer informed pt she will talk with a doctor tomorrow morning.  Pt stated she made no calls tonight. Pt stated she plans to call her mother in the morning. Pt rated her overall day a 10 today. Pt stated her appetite was pretty good today. Pt stated her sleep last night was good. Pt stated the goal for tonight was to get some rest. Pt stated she was in some physical pain tonight. Pt stated she had severe pain in her mouth and lower back tonight. Pt rated the mouth pain and back pain a 10 on the pain level scale. Pt deny auditory issues tonight. Pt admitted to dealing with some visual hallucinations tonight. Pt nurse was updated on situation.  Pt denies thoughts of harming herself or others. Pt stated she would alert staff if anything changed.  Felipa Furnace 01/08/2021, 11:20 PM

## 2021-01-08 NOTE — Progress Notes (Signed)
Kathy Brown is a 40 year old female being admitted voluntarily to [facility] from [facility].  She arrived at [facility] via [name] due to aggressive behaviors at her day program.  She has not been taking her psych medications x 3 months and since being off medications and per mother she has been "acting up lately."  She was yelling, cursing, pushed the door so hard that the glass had broken and was physically violent towards staff.  Per assessment, she was asked to do chores after lunch and became agitated because she didn't want to do them.  While at [facility], she complained of abdominal and chest pain.  She was sent to [facility] for evaluation and was subsequently cleared medically.  During [facility] admission, she was pleasant and cooperative.  She was unable to explain to staff as to why she is here but does know that "I need to be back on my medications." She does report auditory hallucinations but unable to tell writer what they are saying.  She denied HI or visual hallucinations.  She does not appear to be responding to internal stimuli.  She currently lives with her mother, grandmother, father and her 3 children and is planning on returning after discharge.  She continues to report pain all over 10/10 but declined any medication at this time.  Oriented her to the unit.  Admission paperwork completed and signed.  Belongings searched and secured in locker #41, no contraband found.  Skin assessment completed and no skin issues noted.  Q 15 minute checks initiated for safety.  We will continue to monitor the progress towards her goals.

## 2021-01-08 NOTE — Progress Notes (Signed)
Pt stated she has been off her medications for a while. Pt endorses [facility]     01/08/21 2100  Psych Admission Type (Psych Patients Only)  Admission Status Voluntary  Psychosocial Assessment  Eye Contact Fair  Facial Expression Flat  Affect Flat  Speech Slurred;Tangential  Interaction Forwards little;Needy  Motor Activity Slow  Appearance/Hygiene Disheveled  Behavior Characteristics Cooperative;Anxious;Guarded  Mood Suspicious;Anxious;Preoccupied  Thought Administrator, sports thinking;Disorganized;Tangential  Content Blaming others  Delusions None reported or observed  Perception Hallucinations  Hallucination Auditory  Judgment Poor  Confusion Moderate  Danger to Self  Current suicidal ideation? Denies  Danger to Others  Danger to Others None reported or observed

## 2021-01-08 NOTE — ED Notes (Signed)
Pt has been transferred to [facility] via safe Transport with belongings and required paperwork.

## 2021-01-08 NOTE — ED Notes (Signed)
safe transport called for a 1745 pick up

## 2021-01-08 NOTE — Progress Notes (Signed)
Pt was accepted to [facility].   Meets inpatient criteria per Dr. Bronwen Betters  The attending physician is Dr. Jola Babinski  Report can be called to [phone]  Patient is  Voluntary  Patient can be transported [facility].  Patient is scheduled to arrive [facility] at 6:00 pm.

## 2021-01-08 NOTE — Tx Team (Signed)
Initial Treatment Plan 01/08/2021 8:14 PM Kathy Brown MEQ:[id number]    PATIENT STRESSORS: Health problems Medication change or noncompliance   PATIENT STRENGTHS: General fund of knowledge Supportive family/friends   PATIENT IDENTIFIED PROBLEMS: Psychosis  Agitation    "Get back on my medications"               DISCHARGE CRITERIA:  Improved stabilization in mood, thinking, and/or behavior Need for constant or close observation no longer present Reduction of life-threatening or endangering symptoms to within safe limits Verbal commitment to aftercare and medication compliance  PRELIMINARY DISCHARGE PLAN: Outpatient therapy Medication management  PATIENT/FAMILY INVOLVEMENT: This treatment plan has been presented to and reviewed with the patient, Kathy Brown.  The patient and family have been given the opportunity to ask questions and make suggestions.  Levin Bacon, RN 01/08/2021, 8:14 PM

## 2021-01-08 NOTE — Progress Notes (Signed)
Pt is under review at [facility]. CSW will continue to follow.

## 2021-01-08 NOTE — ED Notes (Signed)
Pt asking for something for a HA.  Pt reminded that she had received tylenol a few minutes earlier

## 2021-01-08 NOTE — ED Notes (Signed)
Pt belongings in locker #1. 

## 2021-01-08 NOTE — Telephone Encounter (Signed)
Care Management - Follow Up [facility] Discharges  ° °Patient has been placed in an inpatient psychiatric hospital ([facility]).  °

## 2021-01-09 ENCOUNTER — Encounter (HOSPITAL_COMMUNITY): Payer: Self-pay | Admitting: Psychiatry

## 2021-01-09 MED ORDER — DIPHENHYDRAMINE HCL 50 MG/ML IJ SOLN: 50.0000 mg | Freq: Four times a day (QID) | INTRAMUSCULAR | Status: DC | PRN

## 2021-01-09 MED ORDER — LORAZEPAM 2 MG/ML IJ SOLN: 2.0000 mg | Freq: Four times a day (QID) | INTRAMUSCULAR | Status: DC | PRN

## 2021-01-09 MED ORDER — LORAZEPAM 1 MG PO TABS: 2.0000 mg | ORAL_TABLET | Freq: Four times a day (QID) | ORAL | Status: DC | PRN

## 2021-01-09 MED ORDER — RISPERIDONE 1 MG PO TBDP
1.0000 mg | ORAL_TABLET | Freq: Every day | ORAL | Status: DC
  Administered 2021-01-09 – 2021-01-11 (×3): 1 mg via ORAL
  Filled 2021-01-09 (×6): qty 1

## 2021-01-09 NOTE — Progress Notes (Signed)
   01/09/21 0619  Vital Signs  Temp 97.8 F (36.6 C)  Temp Source Oral  Pulse Rate 79  Resp 20  BP 114/73  BP Location Right Arm  BP Method Automatic  Patient Position (if appropriate) Sitting  Oxygen Therapy  SpO2 100 %   D: Patient denies SI/HI/AVH. Patient denies both anxiety and depression. Pt. Has been out in open areas. Pt. Complained of some constipation. A::  Patient took scheduled medicine. Patient had scheduled MOM and reported having a BM. Support and encouragement provided Routine safety checks conducted every 15 minutes. Patient  Informed to notify staff with any concerns.   R: Safety maintained.

## 2021-01-09 NOTE — Tx Team (Signed)
Interdisciplinary Treatment and Diagnostic Plan Update  01/09/2021 Time of Session: 9:40am  Kathy Brown MRN: [id number]  Principal Diagnosis: Paranoid schizophrenia [facility])  Secondary Diagnoses: Principal Problem:   Paranoid schizophrenia ([place]) Active Problems:   Schizoaffective disorder ([place])   Current Medications:  Current Facility-Administered Medications  Medication Dose Route Frequency Provider Last Rate Last Admin  . alum & mag hydroxide-simeth (MAALOX/MYLANTA) 200-200-20 MG/5ML suspension 30 mL  30 mL Oral Q6H PRN Ethelene Hal, NP      . benztropine (COGENTIN) tablet 0.5 mg  0.5 mg Oral BID Ethelene Hal, NP   0.5 mg at 01/09/21 0751  . diltiazem (CARDIZEM CD) 24 hr capsule 120 mg  120 mg Oral Daily Sharma Covert, MD   120 mg at 01/09/21 0750  . hydrOXYzine (ATARAX/VISTARIL) tablet 25 mg  25 mg Oral TID PRN Ethelene Hal, NP      . influenza vac split quadrivalent PF (FLUARIX) injection 0.5 mL  0.5 mL Intramuscular Tomorrow-1000 Singleton, Amy E, MD      . magnesium hydroxide (MILK OF MAGNESIA) suspension 30 mL  30 mL Oral Daily Ethelene Hal, NP   30 mL at 01/09/21 0751  . metoprolol tartrate (LOPRESSOR) tablet 25 mg  25 mg Oral BID Ethelene Hal, NP   25 mg at 01/09/21 0750  . risperiDONE (RISPERDAL M-TABS) disintegrating tablet 2 mg  2 mg Oral QHS Ethelene Hal, NP   2 mg at 01/08/21 2106  . traZODone (DESYREL) tablet 50 mg  50 mg Oral QHS,MR X 1 Ethelene Hal, NP   50 mg at 01/08/21 2106   PTA Medications: No medications prior to admission.    Patient Stressors: Health problems Medication change or noncompliance  Patient Strengths: General fund of knowledge Supportive family/friends  Treatment Modalities: Medication Management, Group therapy, Case management,  1 to 1 session with clinician, Psychoeducation, Recreational therapy.   Physician Treatment Plan for Primary Diagnosis: Paranoid schizophrenia  ([place]) Long Term Goal(s):     Short Term Goals:    Medication Management: Evaluate patient's response, side effects, and tolerance of medication regimen.  Therapeutic Interventions: 1 to 1 sessions, Unit Group sessions and Medication administration.  Evaluation of Outcomes: Not Met  Physician Treatment Plan for Secondary Diagnosis: Principal Problem:   Paranoid schizophrenia ([place]) Active Problems:   Schizoaffective disorder ([place])  Long Term Goal(s):     Short Term Goals:       Medication Management: Evaluate patient's response, side effects, and tolerance of medication regimen.  Therapeutic Interventions: 1 to 1 sessions, Unit Group sessions and Medication administration.  Evaluation of Outcomes: Not Met   RN Treatment Plan for Primary Diagnosis: Paranoid schizophrenia ([place]) Long Term Goal(s): Knowledge of disease and therapeutic regimen to maintain health will improve  Short Term Goals: Ability to remain free from injury will improve, Ability to demonstrate self-control, Ability to participate in decision making will improve, Ability to verbalize feelings will improve, Ability to disclose and discuss suicidal ideas and Ability to identify and develop effective coping behaviors will improve  Medication Management: RN will administer medications as ordered by provider, will assess and evaluate patient's response and provide education to patient for prescribed medication. RN will report any adverse and/or side effects to prescribing provider.  Therapeutic Interventions: 1 on 1 counseling sessions, Psychoeducation, Medication administration, Evaluate responses to treatment, Monitor vital signs and CBGs as ordered, Perform/monitor CIWA, COWS, AIMS and Fall Risk screenings as ordered, Perform wound care treatments as ordered.  Evaluation  of Outcomes: Not Met   LCSW Treatment Plan for Primary Diagnosis: Paranoid schizophrenia ([place]) Long Term Goal(s): Safe transition to appropriate  next level of care at discharge, Engage patient in therapeutic group addressing interpersonal concerns.  Short Term Goals: Engage patient in aftercare planning with referrals and resources, Increase social support, Increase emotional regulation, Facilitate acceptance of mental health diagnosis and concerns, Identify triggers associated with mental health/substance abuse issues and Increase skills for wellness and recovery  Therapeutic Interventions: Assess for all discharge needs, 1 to 1 time with Social worker, Explore available resources and support systems, Assess for adequacy in community support network, Educate family and significant other(s) on suicide prevention, Complete Psychosocial Assessment, Interpersonal group therapy.  Evaluation of Outcomes: Not Met   Progress in Treatment: Attending groups: Yes. Participating in groups: Yes. Taking medication as prescribed: Yes. Toleration medication: Yes. Family/Significant other contact made: No, will contact:  None  Patient understands diagnosis: No. Discussing patient identified problems/goals with staff: Yes. Medical problems stabilized or resolved: Yes. Denies suicidal/homicidal ideation: Yes. Issues/concerns per patient self-inventory: No.   New problem(s) identified: No, Describe:  None   New Short Term/Long Term Goal(s): medication stabilization, elimination of SI thoughts, development of comprehensive mental wellness plan.   Patient Goals:  "To get my own place and get a job"   Discharge Plan or Barriers: Patient recently admitted. CSW will continue to follow and assess for appropriate referrals and possible discharge planning.   Reason for Continuation of Hospitalization: Aggression Anxiety Mania Medication stabilization  Estimated Length of Stay: 3 to 5 days   Attendees: Patient: Kathy Brown  01/09/2021   Physician: Ethelene Browns, MD  01/09/2021   Nursing:  01/09/2021   RN Care Manager: 01/09/2021   Social Worker:  Verdis Frederickson, [place] 01/09/2021   Recreational Therapist:  01/09/2021   Other:  01/09/2021   Other:  01/09/2021   Other: 01/09/2021     Scribe for Treatment Team: Darleen Crocker, [place] 01/09/2021 9:46 AM

## 2021-01-09 NOTE — Progress Notes (Signed)
Pt stated she was doing ok, pt visible in the dayroom some this evening . Pt given PRN Trazodone and Vistaril per MAR with HS medication    01/09/21 2300  Psych Admission Type (Psych Patients Only)  Admission Status Voluntary  Psychosocial Assessment  Patient Complaints None  Eye Contact Fair  Facial Expression Flat  Affect Flat  Speech Slurred;Tangential  Interaction Forwards little;Needy  Motor Activity Slow  Appearance/Hygiene Disheveled  Behavior Characteristics Cooperative  Mood Anxious  Thought Process  Coherency Concrete thinking;Disorganized;Tangential  Content Ambivalence  Delusions None reported or observed  Perception Hallucinations  Hallucination Auditory  Judgment Poor  Confusion Moderate  Danger to Self  Current suicidal ideation? Denies  Danger to Others  Danger to Others None reported or observed

## 2021-01-09 NOTE — Progress Notes (Signed)
   01/09/21 0500  Sleep  Number of Hours 7.75

## 2021-01-09 NOTE — [facility] Suicide Risk Assessment (Signed)
[facility] Admission Suicide Risk Assessment   Nursing information obtained from:  Patient Demographic factors:  NA Current Mental Status:  NA Loss Factors:  NA Historical Factors:  Impulsivity Risk Reduction Factors:  Living with another person, especially a relative  Total Time spent with patient: 30 minutes Principal Problem: Paranoid schizophrenia (HCC) Diagnosis:  Principal Problem:   Paranoid schizophrenia (HCC) Active Problems:   Schizoaffective disorder (HCC)  Subjective Data: See H&P  Continued Clinical Symptoms:  Alcohol Use Disorder Identification Test Final Score (AUDIT): 1 The "Alcohol Use Disorders Identification Test", Guidelines for Use in Primary Care, Second Edition.  World Science writer [facility]). Score between 0-7:  no or low risk or alcohol related problems. Score between 8-15:  moderate risk of alcohol related problems. Score between 16-19:  high risk of alcohol related problems. Score 20 or above:  warrants further diagnostic evaluation for alcohol dependence and treatment.   CLINICAL FACTORS:   Severe Anxiety and/or Agitation Schizophrenia:   Paranoid or undifferentiated type Currently Psychotic Previous Psychiatric Diagnoses and Treatments Medical Diagnoses and Treatments/Surgeries   Musculoskeletal: Strength & Muscle Tone: within normal limits Gait & Station: normal Patient leans: N/A  Psychiatric Specialty Exam:  Presentation  General Appearance: Casual; Appropriate for Environment  Eye Contact:Fair; Other (comment) (Intense at times)  Speech:Normal Rate; Other (comment) (Some increased latency of speech. Succinct)  Speech Volume:Normal  Handedness:Left   Mood and Affect  Mood:Anxious  Affect:Blunt   Thought Process  Thought Processes:Coherent; Other (comment) (Becomes disorganized at times)  Descriptions of Associations:Loose  Orientation:Other (comment) (Oriented to self.  Does not know the date.  Knows she is in the  hospital.)  Thought Content:Paranoid Ideation; Scattered; Other (comment) (Illogical at times.)  History of Schizophrenia/Schizoaffective disorder:Yes  Duration of Psychotic Symptoms:Greater than six months  Hallucinations:Hallucinations: Auditory Description of Auditory Hallucinations: Unable to describe the auditory hallucinations  Ideas of Reference:Paranoia  Suicidal Thoughts:Suicidal Thoughts: No  Homicidal Thoughts:Homicidal Thoughts: No   Sensorium  Memory:Immediate Poor; Recent Poor; Remote Poor  Judgment:Impaired  Insight:Lacking   Executive Functions  Concentration:Poor  Attention Span:Poor  Recall:Poor  Fund of Knowledge:Poor  Language:Fair   Psychomotor Activity  Psychomotor Activity:Psychomotor Activity: Normal   Assets  Assets:Desire for Improvement; Housing; Social Support; Leisure Time   Sleep  Sleep:Sleep: Good Number of Hours of Sleep: 7.75    Physical Exam: Physical Exam Vitals and nursing note reviewed.  HENT:     Head: Normocephalic and atraumatic.  Pulmonary:     Effort: Pulmonary effort is normal.  Musculoskeletal:        General: Normal range of motion.     Cervical back: Normal range of motion.  Neurological:     General: No focal deficit present.     Mental Status: She is alert.  Psychiatric:        Attention and Perception: She is inattentive. She perceives auditory hallucinations.        Mood and Affect: Mood is anxious. Affect is blunt.        Speech: Speech is delayed and tangential.        Behavior: Behavior is slowed and withdrawn. Behavior is cooperative.        Thought Content: Thought content is paranoid. Thought content does not include homicidal or suicidal ideation.        Cognition and Memory: Cognition is impaired. Memory is impaired.        Judgment: Judgment is impulsive and inappropriate.    Review of Systems  Constitutional: Negative for diaphoresis and  fever.  HENT: Negative for sore throat.    Respiratory: Negative for cough and shortness of breath.   Cardiovascular: Negative for chest pain and palpitations.  Gastrointestinal: Negative for constipation, diarrhea, nausea and vomiting.  Genitourinary: Negative for dysuria.  Musculoskeletal: Negative for back pain and myalgias.  Neurological: Negative for dizziness, tremors and headaches.  Psychiatric/Behavioral: Positive for hallucinations. Negative for suicidal ideas. The patient is nervous/anxious.    Blood pressure 108/77, pulse (!) 59, temperature 97.8 F (36.6 C), temperature source Oral, resp. rate 20, height 5\' 4"  (1.626 m), weight 101.2 kg, SpO2 100 %. Body mass index is 38.28 kg/m.   COGNITIVE FEATURES THAT CONTRIBUTE TO RISK:  Limited executive function and limited problem-solving skills  SUICIDE RISK:   Minimal: No identifiable suicidal ideation.  Patients presenting with no risk factors but with morbid ruminations; may be classified as minimal risk based on the severity of the depressive symptoms  PLAN OF CARE: See H&P plan  I certify that inpatient services furnished can reasonably be expected to improve the patient's condition.   , MD 01/09/2021, 1:59 PM

## 2021-01-09 NOTE — H&P (Addendum)
Psychiatric Admission Assessment Adult  Patient Identification: Franny Selvage MRN:  [id number] Date of Evaluation:  01/09/2021 Chief Complaint:  Schizoaffective disorder (HCC) [F25.9] Principal Diagnosis: Paranoid schizophrenia (HCC) Diagnosis:  Principal Problem:   Paranoid schizophrenia (HCC) Active Problems:   Schizoaffective disorder (HCC)  History of Present Illness: Carlie Solorzano is a 40 year old female with a past history of schizophrenia, intellectual disability, and atrial fibrillation who was brought to [facility] by [name] after becoming physically aggressive at [employer] day program. On interview today, the patient is a poor historian and appears to be responding to internal stimuli asking, "what happened?" several times in response to questions. She describes her current mood as "happy." The patient states that yesterday she became upset and broke a door at the day program. She was scared when she was brought to [facility] by police and thought she was going home instead of coming to this facility. She states that she gets upset often at home and at the day program. She endorses hearing voices for a long time, but replies "I don't know" when asked what the voices say. She agrees that the voices are scary and mean, but they do not command her to do anything, nor do they tell her to hurt herself or others. She states that the voices become louder and quieter throughout the day and she is unable to block them out. She endorses potential visual hallucinations, stating she has visions of her eyes. She also endorses paranoia regarding someone spying on her or following her. She denies substance use except for a remote history of marijuana use. She denies SI, HI, though broadcasting, thought withdrawal, special messages from TV.  The patient reports she has been sleeping well at home.  She reports that her appetite has been okay, but she has been unable to eat much due to her full mouth dental  extraction. From chart review, collateral information from the patient's family indicates that patient has not been sleeping well and has been staying up at night talking to herself.  Patient's mother reports the patient has not taken her medication for the last 3 months.  The patient reports that her current medications are Haldol and cogentin and that Haldol helps her sleep and keep her body calm. She states that she also takes a medication for occasional chest pain but cannot recall the name. She denies any pain at this time. She states that she has been unable to fill her prescriptions recently due to the cost. She states that she is uninsured and would like assistance obtaining Medicaid and finding a home delivery pharmacy, although per chart review she appears to have Medicare.  Associated Signs/Symptoms: Depression Symptoms:  None Duration of Depression Symptoms: N/A (Hypo) Manic Symptoms:  None Anxiety Symptoms:  None Psychotic Symptoms:  Hallucinations: Auditory Visual Paranoia, PTSD Symptoms: NA Total Time spent with patient: 30 minutes  Past Psychiatric History:  -Schizophrenia (diagnosed 2012) -Anxiety   Prior Hospitalizations: -June 2012 at [facility] for first-break psychosis -May 2015 for depression with SI, worsening AH  Prior Outpatient Therapy:   [facility] for mental health  -[facility] is PCP  Prior Psych Medications: -Haldol  -Risperdal -Cogentin  -Trazodone -Ativan    Is the patient at risk to self? Yes.    Has the patient been a risk to self in the past 6 months? Yes.    Has the patient been a risk to self within the distant past? Yes.    Is the patient a  risk to others? No.  Has the patient been a risk to others in the past 6 months? No.  Has the patient been a risk to others within the distant past? No.    Alcohol Screening: 1. How often do you have a drink containing alcohol?: Monthly or less 2. How many drinks containing alcohol do you  have on a typical day when you are drinking?: 1 or 2 3. How often do you have six or more drinks on one occasion?: Never AUDIT-C Score: 1 9. Have you or someone else been injured as a result of your drinking?: No 10. Has a relative or friend or a doctor or another health worker been concerned about your drinking or suggested you cut down?: No Alcohol Use Disorder Identification Test Final Score (AUDIT): 1 Alcohol Brief Interventions/Follow-up: AUDIT Score <7 follow-up not indicated Substance Abuse History in the last 12 months:  No. Consequences of Substance Abuse: NA Previous Psychotropic Medications: Yes  Psychological Evaluations: No  Past Medical History:  -Anemia -AFib (diagnosed 2018), followed by Cardiology - Dr. Dietrich Pates   Past Surgical History:  Procedure Laterality Date  . TOOTH EXTRACTION Bilateral 09/08/2018   Procedure: DENTAL RESTORATION/EXTRACTIONS;  Surgeon: Ocie Doyne, DDS;  Location: [facility];  Service: Oral Surgery;  Laterality: Bilateral;   Family History:  Family History  Problem Relation Age of Onset  . Hypertension Mother   . Diabetes Mother    Family Psychiatric  History: Unable to obtain information from patient, non per chart review  Tobacco Screening: Have you used any form of tobacco in the last 30 days? (Cigarettes, Smokeless Tobacco, Cigars, and/or Pipes): No Social History: -Is her own guardian -Lives with her mother, father, grandmother, and her 3 children (ages 59, 69, and 48) -Unemployed and receives SSI/disability payments  Social History   Substance and Sexual Activity  Alcohol Use No     Social History   Substance and Sexual Activity  Drug Use No    Additional Social History: Marital status: Single Are you sexually active?: No What is your sexual orientation?: Heterosexual Has your sexual activity been affected by drugs, alcohol, medication, or emotional stress?: n/a Does patient have children?: Yes How many  children?: 3 How is patient's relationship with their children?: Pt reports she does not have a relationship with her children and states that they are cared for by her mother-moved to [place] from [place] in 2012 to be closer to mom      Allergies:  No Known Allergies Lab Results:  Results for orders placed or performed during the hospital encounter of 01/07/21 (from the past 48 hour(s))  Basic metabolic panel     Status: None   Collection Time: 01/07/21  7:40 PM  Result Value Ref Range   Sodium 136 135 - 145 mmol/L   Potassium 4.0 3.5 - 5.1 mmol/L   Chloride 105 98 - 111 mmol/L   CO2 24 22 - 32 mmol/L   Glucose, Bld 92 70 - 99 mg/dL    Comment: Glucose reference range applies only to samples taken after fasting for at least 8 hours.   BUN 7 6 - 20 mg/dL   Creatinine, Ser 6.29 0.44 - 1.00 mg/dL   Calcium 9.3 8.9 - 52.8 mg/dL   GFR, Estimated >41 >32 mL/min    Comment: (NOTE) Calculated using the CKD-EPI Creatinine Equation (2021)    Anion gap 7 5 - 15    Comment: Performed at [facility], [address] [place], [place] [address]  CBC     Status: Abnormal   Collection Time: 01/07/21  7:40 PM  Result Value Ref Range   WBC 6.3 4.0 - 10.5 K/uL   RBC 4.57 3.87 - 5.11 MIL/uL   Hemoglobin 11.4 (L) 12.0 - 15.0 g/dL   HCT 93.8 18.2 - 99.3 %   MCV 81.6 80.0 - 100.0 fL   MCH 24.9 (L) 26.0 - 34.0 pg   MCHC 30.6 30.0 - 36.0 g/dL   RDW 71.6 96.7 - 89.3 %   Platelets 285 150 - 400 K/uL   nRBC 0.0 0.0 - 0.2 %    Comment: Performed at [facility], [address] [place], [place] [address]  Troponin I (High Sensitivity)     Status: None   Collection Time: 01/07/21  7:40 PM  Result Value Ref Range   Troponin I (High Sensitivity) 5 <18 ng/L    Comment: (NOTE) Elevated high sensitivity troponin I (hsTnI) values and significant  changes across serial measurements may suggest ACS but many other  chronic and acute conditions are known to elevate hsTnI results.  Refer to  the "Links" section for chest pain algorithms and additional  guidance. Performed at [facility], [address], [place], [place] [address]   Lipase, blood     Status: None   Collection Time: 01/07/21  7:40 PM  Result Value Ref Range   Lipase 26 11 - 51 U/L    Comment: Performed at [facility], [address], [place], [place] [address]  Protime-INR     Status: None   Collection Time: 01/07/21  7:40 PM  Result Value Ref Range   Prothrombin Time 13.2 11.4 - 15.2 seconds   INR 1.0 0.8 - 1.2    Comment: (NOTE) INR goal varies based on device and disease states. Performed at [facility], [address], [place], [place] [address]   I-Stat beta hCG blood, ED     Status: None   Collection Time: 01/07/21  8:39 PM  Result Value Ref Range   I-stat hCG, quantitative <5.0 <5 mIU/mL   Comment 3            Comment:   GEST. AGE      CONC.  (mIU/mL)   <=1 WEEK        5 - 50     2 WEEKS       50 - 500     3 WEEKS       100 - 10,000     4 WEEKS     1,000 - 30,000        FEMALE AND NON-PREGNANT FEMALE:     LESS THAN 5 mIU/mL     Blood Alcohol level:  Lab Results  Component Value Date   ETH <10 01/07/2021   ETH <10 07/09/2018    Metabolic Disorder Labs:  Lab Results  Component Value Date   HGBA1C 6.9 (H) 01/07/2021   MPG 151.33 01/07/2021   No results found for: PROLACTIN Lab Results  Component Value Date   CHOL 200 01/07/2021   TRIG 109 01/07/2021   HDL 41 01/07/2021   CHOLHDL 4.9 01/07/2021   VLDL 22 01/07/2021   LDLCALC 137 (H) 01/07/2021    Current Medications: Current Facility-Administered Medications  Medication Dose Route Frequency Provider Last Rate Last Admin  . alum & mag hydroxide-simeth (MAALOX/MYLANTA) 200-200-20 MG/5ML suspension 30 mL  30 mL Oral Q6H  PRN Laveda AbbeParks, Laurie Britton, NP      . benztropine (COGENTIN) tablet 0.5 mg  0.5 mg Oral BID Laveda AbbeParks, Laurie Britton, NP   0.5 mg at 01/09/21 0751  . diltiazem (CARDIZEM CD) 24 hr capsule 120  mg  120 mg Oral Daily Antonieta Pertlary, Greg Lawson, MD   120 mg at 01/09/21 0750  . hydrOXYzine (ATARAX/VISTARIL) tablet 25 mg  25 mg Oral TID PRN Laveda AbbeParks, Laurie Britton, NP      . influenza vac split quadrivalent PF (FLUARIX) injection 0.5 mL  0.5 mL Intramuscular Tomorrow-1000 Singleton, Amy E, MD      . magnesium hydroxide (MILK OF MAGNESIA) suspension 30 mL  30 mL Oral Daily Laveda AbbeParks, Laurie Britton, NP   30 mL at 01/09/21 0751  . metoprolol tartrate (LOPRESSOR) tablet 25 mg  25 mg Oral BID Laveda AbbeParks, Laurie Britton, NP   25 mg at 01/09/21 0750  . risperiDONE (RISPERDAL M-TABS) disintegrating tablet 2 mg  2 mg Oral QHS Laveda AbbeParks, Laurie Britton, NP   2 mg at 01/08/21 2106  . traZODone (DESYREL) tablet 50 mg  50 mg Oral QHS,MR X 1 Laveda AbbeParks, Laurie Britton, NP   50 mg at 01/08/21 2106   PTA Medications: No medications prior to admission.    Musculoskeletal: Strength & Muscle Tone: within normal limits Gait & Station: normal Patient leans: N/A  Psychiatric Specialty Exam: General Appearance: Casual  Eye Contact::  Fair.  Eye contact is intense at times.  Speech:  Slow and difficult to understand at times.  Some increased latency of speech is present  Volume:  Normal  Mood:  "happy" and anxious  Affect:  Blunt and Non-Congruent  Thought Process:  Disorganized  Orientation:  Other:  Oriented to person only. Unable to state time or place, but unclear if this is patient's baseline due to IDD.  Thought Content:  Hallucinations: Auditory Visual, Paranoid Ideation and No Ideas of Reference.  Suicidal Thoughts:  No  Homicidal Thoughts:  No  Memory:  Did not formally assess, but likely impaired due to I/DD.   Judgement:  Impaired  Insight:  Lacking  Psychomotor Activity:  Normal  Concentration:  Poor  Recall:  Did not formally assess, but likely impaired due to I/DD.  Akathisia:  No  AIMS (if indicated):     Assets:  Housing Resilience Social Support  Sleep:   Good   Physical Exam: Physical Exam Vitals  and nursing note reviewed.  HENT:     Head: Normocephalic and atraumatic.  Musculoskeletal:     Cervical back: Normal range of motion.  Neurological:     Mental Status: She is alert.  Psychiatric:        Attention and Perception: She perceives auditory and visual hallucinations.        Thought Content: Thought content is paranoid.        Cognition and Memory: Cognition is impaired.    Review of Systems  Constitutional: Negative for chills, diaphoresis, fever and malaise/fatigue.  HENT: Negative for sore throat.   Respiratory: Negative for cough and shortness of breath.   Cardiovascular: Negative for chest pain.  Gastrointestinal: Negative for abdominal pain, constipation, diarrhea, nausea and vomiting.  Genitourinary: Negative for dysuria.  Musculoskeletal: Negative for myalgias.  Skin: Positive for rash. Negative for itching.  Neurological: Negative for dizziness and headaches.  Psychiatric/Behavioral: Positive for hallucinations. Negative for depression, substance abuse and suicidal ideas.   Blood pressure 108/77, pulse (!) 59, temperature 97.8 F (36.6 C), temperature source Oral, resp. rate 20,  height  (1.626 m), weight 101.2 kg, SpO2 100 %. Body mass index is 38.28 kg/m.  Treatment Plan Summary: Kruti Horacek is a 40 year old female with a past history of schizophrenia, intellectual disability, and atrial fibrillation who was brought to [facility] by [name] after becoming physically aggressive at [employer] day program. She reports feeling upset often and continuously bothered by the voices she hears, likely due to not taking her psychiatric medications for several months. Plan to restart psychiatric medications as well as medications for afib and angina.   Daily contact with patient to assess and evaluate symptoms and progress in treatment.  Continue q59min observation for patient safety.   Labs ordered and reviewed. CBC showed decreased hemoglobin at 11.4, consistent  with patient's prior diagnosis of anemia. Will start ferrous sulfate tablet. BMP and TSH wnl. LDL cholesterol elevated at 137, Hgb A1c elevated at 6.9%. Magnesium, PTT INR, and lipase wnl, negative for troponin, beta hCG, and ethanol.   Vitals reviewed. Continue to monitor vitals to ensure that she does not become hypotensive or bradycardic.   Obtain consult from pharmacy and consider starting metformin for elevated Hgb A1c as well as counseling patient on diabetes management.   Schizophrenia -Continue Risperidone ODT and increase to  qam and 2 mg for schizophrenia and will titrate upward as indicated -Continue Cogentin 0.5 mg tablets for EPS.  Anemia -Start Ferrous sulfate 325 mg tablet qbreakfast for anemia   Insomnia -Continue Trazodone 50 mg tablet qhs for insomnia  A. fib/hypertension -Continue Metoprolol 25 mg po BID for afib, HTN.  -Continue Cardizem CD 120 mg XR angina, HTN.  -EKG reviewed  Social worker will meet with patient to help decrease financial barriers to obtaining medications and potentially set up home delivery of medications.    Team will contact collaterals to expand database.   Physician Treatment Plan for Primary Diagnosis: Paranoid schizophrenia (HCC) Long Term Goal(s): Improvement in symptoms so as ready for discharge  Short Term Goals: Ability to identify changes in lifestyle to reduce recurrence of condition will improve, Ability to verbalize feelings will improve, Ability to disclose and discuss suicidal ideas, Ability to demonstrate self-control will improve, Ability to identify and develop effective coping behaviors will improve, Ability to maintain clinical measurements within normal limits will improve and Compliance with prescribed medications will improve  Dayna Barker MS3 Clifton Custard MD  I attest that I saw and interviewed the patient together with the medical student and that I was present for the entire duration of the interview with the  patient.  I performed or reperformed the mental status examination as indicated.  I reviewed the medical record.  I have formulated the plan of treatment.  I am in agreement with the exam, assessment and plan as documented above.  I certify that inpatient services furnished can reasonably be expected to improve the patient's condition.    Claudie Revering, MD 3/16/20222:12 PM

## 2021-01-09 NOTE — Progress Notes (Signed)
Recreation Therapy Notes  Date: 3.16.22 Time: 1000 Location: 500 Hall Dayroom   Group Topic: Communication, Team Building, Problem Solving  Goal Area(s) Addresses:  Patient will effectively work with peer towards shared goal.  Patient will identify skills used to make activity successful.  Patient will share challenges and verbalize solution-driven approaches used. Patient will identify how skills used during activity can be used to reach post d/c goals.   Behavioral Response: Minimal  Intervention: STEM Activity   Activity: [employer]. Patients were provided the following materials: 2 drinking straws, 5 rubber bands, 5 paper clips, 2 index cards and 2 drinking cups. Using the provided materials patients were asked to build a launching mechanism to launch a ping pong ball across the room, approximately 10 feet. Patients were divided into teams of 3-5. Instructions required all materials be incorporated into the device, functionality of items left to the peer group's discretion.  Education: Pharmacist, community, Scientist, physiological, Air cabin crew, Building control surveyor.   Education Outcome: Acknowledges education/In group clarification offered/Needs additional education.   Clinical Observations/Feedback: Pt was quiet and attentive but also seemed confused about the activity.  Pt was more observant than anything else.  At one point, patient was smiling and mouthing something to someone not there.    Caroll Rancher, LRT/CTRS    Caroll Rancher A 01/09/2021 12:53 PM

## 2021-01-09 NOTE — Progress Notes (Signed)
Recreation Therapy Notes  INPATIENT RECREATION THERAPY ASSESSMENT  Patient Details Name: Catha Ontko MRN: [id number] DOB: [birth year] Today's Date: 01/09/2021       Information Obtained From: Patient  Able to Participate in Assessment/Interview: Yes  Patient Presentation: Alert,Withdrawn  Reason for Admission (Per Patient): Med Non-Compliance  Patient Stressors: Other (Comment) ("I don't know")  Coping Skills:   Isolation,Self-Injury,Journal,TV,Sports,Exercise,Deep Data processing manager  Leisure Interests (2+):  Individual - Other (Comment),Social - Family,Nature - Other (Comment) (Sit outside)  Frequency of Recreation/Participation: Other (Comment) (Movies- No T.V.; Family- Everyday)  Awareness of Community Resources:  No  Expressed Interest in [employer] Information: No  County of Residence:  [place]  Patient Main Form of Transportation:  ("I don't know")  Patient Strengths:  Shop; Watch movies  Patient Identified Areas of Improvement:  None  Patient Goal for Hospitalization:  "work on medication"  Current SI (including self-harm):  No  Current HI:  No  Current AVH: Yes (Hearing voices, didn't know what they were saying)  Staff Intervention Plan: Collaborate with Interdisciplinary Treatment Team,Group Attendance  Consent to Intern Participation: N/A    Caroll Rancher, LRT/CTRS   Lillia Abed, Marjette A 01/09/2021, 1:30 PM
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
## 2021-01-09 NOTE — B[facility] Group Notes (Addendum)
B[facility] LCSW Group Therapy  01/09/2021 2:04 PM  Type of Therapy:  Group Therapy  Participation Level:  Active  Participation Quality:  Appropriate  Affect:  Appropriate  Cognitive:  Confused  Insight:  Poor  Engagement in Therapy:  Engaged  Modes of Intervention:  Activity  Summary of Progress/Problems: Patient attended and participated in group. Patient was observed to be interacting with peers appropriately.   Babacar Haycraft A Jermond Burkemper 01/09/2021, 2:04 PM

## 2021-01-10 MED ORDER — RISPERIDONE 1 MG PO TBDP
3.0000 mg | ORAL_TABLET | Freq: Every day | ORAL | Status: DC
  Administered 2021-01-10 – 2021-01-12 (×3): 3 mg via ORAL
  Filled 2021-01-10 (×5): qty 3

## 2021-01-10 NOTE — Progress Notes (Signed)
   01/10/21 0500  Sleep  Number of Hours 4.5

## 2021-01-10 NOTE — Progress Notes (Signed)
Dar Note: Patient presents with a calm affect and mood.  Denies suicidal thoughts, auditory and visual hallucinations.  Medication given as prescribed.  Routine safety checks maintained.  Patient visible in milieu interacting with staff and peers.  Attended group and participated.  Patient is safe on and off the unit.

## 2021-01-10 NOTE — Progress Notes (Signed)
Pt visible in the dayroom some this evening. Pt given PRN Vistaril per MAR with HS medications    01/10/21 2200  Psych Admission Type (Psych Patients Only)  Admission Status Voluntary  Psychosocial Assessment  Patient Complaints None  Eye Contact Fair  Facial Expression Flat  Affect Flat  Speech Slurred;Tangential  Interaction Forwards little;Needy  Motor Activity Slow  Appearance/Hygiene Disheveled  Behavior Characteristics Cooperative  Mood Suspicious;Depressed  Thought Process  Coherency Concrete thinking;Disorganized;Tangential  Content Ambivalence  Delusions None reported or observed  Perception Hallucinations  Hallucination Auditory  Judgment Poor  Confusion Moderate  Danger to Self  Current suicidal ideation? Denies  Danger to Others  Danger to Others None reported or observed

## 2021-01-10 NOTE — B[facility] Group Notes (Signed)
The focus of this group is to help patients establish daily goals to achieve during treatment and discuss how the patient can incorporate goal setting into their daily lives to aide in recovery.  Pt attended group but could not come up with a goal.
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
## 2021-01-10 NOTE — BHH Counselor (Signed)
CSW faxed ACTT referral to [facility].    Ruthann Cancer MSW, LCSW Clincal Social Worker  [facility]
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
## 2021-01-10 NOTE — BHH Suicide Risk Assessment (Signed)
B[facility] INPATIENT:  Family/Significant Other Suicide Prevention Education  Suicide Prevention Education:  Contact Attempts: mother Nadara Mustard [phone]), has been identified by the patient as the family member/significant other with whom the patient will be residing, and identified as the person(s) who will aid the patient in the event of a mental health crisis.  With written consent from the patient, two attempts were made to provide suicide prevention education, prior to and/or following the patient's discharge.  We were unsuccessful in providing suicide prevention education.  A suicide education pamphlet was given to the patient to share with family/significant other.   Date and time of first attempt: 01/10/2021 at 1:05pm CSW left a HIPAA compliant voicemail for pt's mother. Date and time of second attempt: Second attempt still needs to be made.   Ruthann Cancer MSW, LCSW Clincal Social Worker  [facility]

## 2021-01-10 NOTE — BHH Suicide Risk Assessment (Signed)
B[facility] INPATIENT:  Family/Significant Other Suicide Prevention Education  Suicide Prevention Education: Education Completed; mother Nadara Mustard ([phone]),has been identified by the patient as the family member/significant other with whom the patient will be residing, and identified as the person(s) who will aid the patient in the event of a mental health crisis (suicidal ideations/suicide attempt).  With written consent from the patient, the family member/significant other has been provided the following suicide prevention education, prior to the and/or following the discharge of the patient.  The suicide prevention education provided includes the following:  Suicide risk factors  Suicide prevention and interventions  National Suicide Hotline telephone number  [facility] assessment telephone number  [facility] Emergency Assistance 911  [facility] and/or Residential Mobile Crisis Unit telephone number   Request made of family/significant other to:  Remove weapons (e.g., guns, rifles, knives), all items previously/currently identified as safety concern.    Remove drugs/medications (over-the-counter, prescriptions, illicit drugs), all items previously/currently identified as a safety concern.   The family member/significant other verbalizes understanding of the suicide prevention education information provided.  The family member/significant other agrees to remove the items of safety concern listed above.  CSW spoke with this patients mother who stated that this patient has not been taking medications for the past 3 months due to being unable to pay the $200 bill. Pt's mother has no concerns with her returning home at discharge and is agreeable to ACTT services for this patient.   Ruthann Cancer MSW, LCSW Clincal Social Worker  [facility]

## 2021-01-11 ENCOUNTER — Ambulatory Visit: Payer: Medicare Other | Admitting: Cardiovascular Disease

## 2021-01-11 MED ORDER — BENZTROPINE MESYLATE 0.5 MG PO TABS: 0.5000 mg | ORAL_TABLET | Freq: Two times a day (BID) | ORAL | Status: DC | PRN

## 2021-01-11 MED ORDER — LORAZEPAM 1 MG PO TABS
1.0000 mg | ORAL_TABLET | Freq: Two times a day (BID) | ORAL | Status: DC | PRN
Start: 2021-01-11 — End: 2021-01-14

## 2021-01-11 MED ORDER — HYDROXYZINE HCL 10 MG PO TABS
10.0000 mg | ORAL_TABLET | Freq: Three times a day (TID) | ORAL | Status: DC | PRN
  Administered 2021-01-11 – 2021-01-13 (×3): 10 mg via ORAL
  Filled 2021-01-11 (×3): qty 1

## 2021-01-11 NOTE — [facility] Group Notes (Signed)
This patient attended wrap up group with this writer on 01/10/21.  Pt.  participated in the "Strengths Exploration" exercise and was able to explain to her level of understand why she choice the strengths she thought she was good at.  She also talked about how her overall day went on yesterday.

## 2021-01-11 NOTE — Progress Notes (Signed)
   01/11/21 2000  Psych Admission Type (Psych Patients Only)  Admission Status Voluntary  Psychosocial Assessment  Patient Complaints None  Eye Contact Fair  Facial Expression Flat  Affect Flat  Speech Slurred;Tangential  Interaction Forwards little;Needy  Motor Activity Slow  Appearance/Hygiene Disheveled  Behavior Characteristics Cooperative  Mood Depressed  Thought Process  Coherency Concrete thinking;Disorganized;Tangential  Content Ambivalence  Delusions None reported or observed  Perception Hallucinations  Hallucination Auditory  Judgment Poor  Confusion Moderate  Danger to Self  Current suicidal ideation? Denies  Danger to Others  Danger to Others None reported or observed

## 2021-01-11 NOTE — Progress Notes (Signed)
Pt received PRN Vistaril per MAR at pt request with her HS medications

## 2021-01-11 NOTE — [facility] Group Notes (Signed)
[facility] LCSW Group Therapy    Type of Therapy/Topic: Identifying Irrational Beliefs/Thoughts  Participation Level: Minimal  Description of Group: The purpose of this group is to assist patients in learning to identify irrational beliefs and thoughts that contribute to their negative emotions and experience positive emotions. Patients will be guided to discuss ways in which they have been effected by irrational thoughts and beliefs and how to transform those irrational beliefs into rational ones. Newly identified rational beliefs will be juxtaposed with experiences of positive emotions or situations, and patients will be challenged to use rational beliefs or thoughts to combat negative ones. Special emphasis will be placed on coping with irrational beliefs in conflict situations, and patients will process healthy conflict resolution skills.  Therapeutic Goals: 1. Patient will identify two irrational thoughts or beliefs  to reflect on in order to balance out those thoughts 2. Patient will label two or more irrational thoughts/beliefs that they find the most difficult to cope with 3. Patient will demonstrate positive conflict resolution skills through discussion and/or role plays that will assist in transforming irrational thoughts or beliefs into positive ones.  Summary of Patient Progress: Patient attended group however, offered minimal engagement in group conversation.    Therapeutic Modalities: Cognitive Behavioral Therapy Feelings Identification Dialectical Behavioral Therapy   Ruthann Cancer MSW, LCSW Clincal Social Worker  [facility]
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
## 2021-01-11 NOTE — Care Management Important Message (Signed)
Medicare IM given to [name], LCSW to give to the patient.  

## 2021-01-11 NOTE — Progress Notes (Signed)
   01/11/21 1300  Psych Admission Type (Psych Patients Only)  Admission Status Voluntary  Psychosocial Assessment  Patient Complaints None  Eye Contact Fair  Facial Expression Flat  Affect Flat  Speech Slurred;Tangential  Interaction Forwards little;Needy  Motor Activity Slow  Appearance/Hygiene Disheveled  Behavior Characteristics Cooperative  Mood Depressed  Thought Process  Coherency Concrete thinking;Disorganized;Tangential  Content Ambivalence  Delusions None reported or observed  Perception Hallucinations  Hallucination Auditory  Judgment Poor  Confusion Moderate  Danger to Self  Current suicidal ideation? Denies  Danger to Others  Danger to Others None reported or observed

## 2021-01-12 DIAGNOSIS — F200 Paranoid schizophrenia: Secondary | ICD-10-CM | POA: Diagnosis not present

## 2021-01-12 MED ORDER — RISPERIDONE ER 90 MG ~~LOC~~ PRSY
90.0000 mg | PREFILLED_SYRINGE | Freq: Once | SUBCUTANEOUS | Status: AC
  Administered 2021-01-12: 90 mg via SUBCUTANEOUS

## 2021-01-12 NOTE — Progress Notes (Signed)
   01/12/21 0616  Vital Signs  Temp 97.6 F (36.4 C)  Temp Source Oral  Pulse Rate 90  Pulse Rate Source Monitor  Resp 20  BP (!) 101/58  BP Location Right Arm  BP Method Automatic  Patient Position (if appropriate) Sitting  Oxygen Therapy  SpO2 99 %   D: Patient denies SI/HI/AVH. Pt. Denies anxiety and depression. Pt. out in open areas and was social with peers.  A:  Patient took scheduled medicine.  Support and encouragement provided Routine safety checks conducted every 15 minutes. Patient  Informed to notify staff with any concerns.   R: Safety maintained.

## 2021-01-12 NOTE — Progress Notes (Signed)
   01/12/21 0500  Sleep  Number of Hours 8

## 2021-01-12 NOTE — Progress Notes (Signed)
Adult Psychoeducational Group Note  Date:  01/12/2021 Time:  10:54 PM  Group Topic/Focus:  Wrap-Up Group:   The focus of this group is to help patients review their daily goal of treatment and discuss progress on daily workbooks.  Participation Level:  Minimal  Participation Quality:  Drowsy  Affect:  Appropriate  Cognitive:  Disorganized  Insight: Limited  Engagement in Group:  Limited  Modes of Intervention:  Discussion  Additional Comments:   Pt stated her goal for today was to focus on her treatment plan. Pt stated she felt she accomplished her goal today. Pt stated she talked with her doctor today but did not get a chance to talk with her social worker, regarding her care today.  Pt stated she made no calls tonight. Pt rated her overall day a 10 today. Pt stated her appetite was pretty good today. Pt stated her sleep last night was good. Pt stated the goal for tonight was to get some rest. Pt stated she was in no physical pain tonight. Pt deny auditory issues tonight and visual hallucinations issues tonight. Pt denies thoughts of harming herself or others. Pt stated she would alert staff if anything changed.   Felipa Furnace 01/12/2021, 10:54 PM

## 2021-01-12 NOTE — Progress Notes (Signed)
Adult Psychoeducational Group Note  Date:  01/12/2021 Time:  12:01 AM  Group Topic/Focus:  Wrap-Up Group:   The focus of this group is to help patients review their daily goal of treatment and discuss progress on daily workbooks.  Participation Level:  Did Not Attend  Participation Quality:  Did Not Attend  Affect:  Did Not Attend  Cognitive:  Did Not Attend  Insight: None  Engagement in Group:  Did Not Attend  Modes of Intervention:  Did Not Attend  Additional Comments:  Pt did not attend evening wrap up group tonight.  Felipa Furnace 01/12/2021, 12:01 AM
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
## 2021-01-12 NOTE — [facility] Group Notes (Signed)
.  Psychoeducational Group Note  Date: 01-12-2021 Time: 0900-1000    Goal Setting   Purpose of Group: This group helps to provide patients with the steps of setting a goal that is specific, measurable, attainable, realistic and time specific. A discussion on how we keep ourselves stuck with negative self talk.    Participation Level:  Active  Participation Quality:  Appropriate  Affect:  Appropriate  Cognitive:  Appropriate  Insight:  Improving  Engagement in Group:  Engaged  Additional Comments:  Pt attended the group. She was very quiet throughout the group. Did respond.when ask questions  Dione Housekeeper

## 2021-01-13 DIAGNOSIS — F200 Paranoid schizophrenia: Secondary | ICD-10-CM | POA: Diagnosis not present

## 2021-01-13 MED ORDER — RISPERIDONE 3 MG PO TBDP
3.0000 mg | ORAL_TABLET | Freq: Every day | ORAL | Status: DC
  Administered 2021-01-13: 3 mg via ORAL
  Filled 2021-01-13 (×2): qty 1
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
## 2021-01-13 NOTE — Progress Notes (Signed)
   01/13/21 2040  COVID-19 Daily Checkoff  Have you had a fever (temp > 37.80C/100F)  in the past 24 hours?  No  If you have had runny nose, nasal congestion, sneezing in the past 24 hours, has it worsened? No  COVID-19 EXPOSURE  Have you traveled outside the state in the past 14 days? No  Have you been in contact with someone with a confirmed diagnosis of COVID-19 or PUI in the past 14 days without wearing appropriate PPE? No  Have you been living in the same home as a person with confirmed diagnosis of COVID-19 or a PUI (household contact)? No  Have you been diagnosed with COVID-19? No

## 2021-01-13 NOTE — Progress Notes (Signed)
Patient has been isolative to her room. She reports that she has tired today. She was compliant with her hs medications. Support given and safety maintained with 15 min checks.

## 2021-01-13 NOTE — [facility] Group Notes (Signed)
Adult Psychoeducational Group Not Date:  01/13/2021 Time:  8299-3716 Group Topic/Focus: PROGRESSIVE RELAXATION. A group where deep breathing is taught and tensing and relaxation muscle groups is used. Imagery is used as well.  Pts are asked to imagine 3 pillars that hold them up when they are not able to hold themselves up.  Participation Level:  Active  Participation Quality:  Appropriate  Affect:  Appropriate  Cognitive:  Oriented  Insight: Improving  Engagement in Group:  Engaged  Modes of Intervention:  Activity, Discussion, Education, and Support  Additional Comments:  Rates her energy as a 10/10. States what holds her up is her children  Dione Housekeeper

## 2021-01-13 NOTE — Progress Notes (Signed)
On approach, the pt was terrified to take her medications this morning and thought that this writer was going to give her a shot. She was crying and refusing to come take her medications. She expressed that she was given a shot in her stomach yesterday and that her stomach was hurting from the shot. Writer reassured the pt that she would not be receiving an injection this morning and explained to the pt that the medications will be given by mouth. Patient then agreed to take the medications. Writer asked the pt if she was suicidal or homicidal, she stated "no." Writer asked the pt if she was experiencing AVH, she stated "you ask a lot of questions" and then walked off. She's had minimal interaction on the unit and appeared more irritable today and less engaged with her peers.   Orders reviewed. Vital signs reviewed.  Verbal support provided. Patient encouraged to attend groups. 15 minute checks performed for safety.

## 2021-01-13 NOTE — Progress Notes (Signed)
   01/13/21 0500  Sleep  Number of Hours 7.5

## 2021-01-13 NOTE — Progress Notes (Signed)
Adult Psychoeducational Group Note  Date:  01/13/2021 Time:  11:47 PM  Group Topic/Focus:  Wrap-Up Group:   The focus of this group is to help patients review their daily goal of treatment and discuss progress on daily workbooks.  Participation Level:  Did Not Attend  Participation Quality:  Did Not Attend  Affect:  Did Not Attend  Cognitive:  Did Not Attend  Insight: None  Engagement in Group:  Did Not Attend  Modes of Intervention:  Did Not Attend  Additional Comments:  Pt did not attend evening wrap up group tonight.  Kathy Brown 01/13/2021, 11:47 PM

## 2021-01-14 DIAGNOSIS — F200 Paranoid schizophrenia: Secondary | ICD-10-CM | POA: Diagnosis not present

## 2021-01-14 MED ORDER — DILTIAZEM HCL ER COATED BEADS 120 MG PO CP24: 120.0000 mg | ORAL_CAPSULE | Freq: Every day | ORAL | 0 refills | Status: DC

## 2021-01-14 MED ORDER — HYDROXYZINE HCL 10 MG PO TABS: 10.0000 mg | ORAL_TABLET | Freq: Three times a day (TID) | ORAL | 0 refills | Status: DC | PRN

## 2021-01-14 MED ORDER — BENZTROPINE MESYLATE 0.5 MG PO TABS: 0.5000 mg | ORAL_TABLET | Freq: Two times a day (BID) | ORAL | 0 refills | Status: DC | PRN

## 2021-01-14 MED ORDER — RISPERIDONE 3 MG PO TBDP
3.0000 mg | ORAL_TABLET | Freq: Every day | ORAL | Status: DC
  Filled 2021-01-14: qty 1

## 2021-01-14 MED ORDER — RISPERIDONE 3 MG PO TBDP: 3.0000 mg | ORAL_TABLET | Freq: Every day | ORAL | 0 refills | Status: DC

## 2021-01-14 MED ORDER — TRAZODONE HCL 50 MG PO TABS
50.0000 mg | ORAL_TABLET | Freq: Every evening | ORAL | 0 refills | Status: DC | PRN
Start: 2021-01-14 — End: 2021-11-22
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
## 2021-01-14 NOTE — Progress Notes (Signed)
Recreation Therapy Notes  Date: 3.21.22 Time: 1000 Location: 500 Hall Dayroom  Group Topic: Coping Skills  Goal Area(s) Addresses:  Patient will identify positive coping skills. Patient will identify benefit of coping skills. Patient will identify benefit of using coping skills post d/c.  Intervention: Mind map, Pencils, [employer] Erase Markers  Activity:  Coping Skills.  Patients were given a blank mind map.  LRT and patients filled in the first 8 boxes together with situations (depression, anxiety, social association, stress, eating disorder, self care, finances and education) in which coping skills could be used.  Patients were then given time to come up with at least 3 coping skills for each situation identified.  After this, the group would come back together and LRT would write the responses on the board.    Education: Pharmacologist, Building control surveyor.   Education Outcome: Acknowledges understanding/In group clarification offered/Needs additional education.   Clinical Observations/Feedback: Pt did not attend group session.    Caroll Rancher, LRT/CTRS         Caroll Rancher A 01/14/2021 12:19 PM

## 2021-01-14 NOTE — [facility] Suicide Risk Assessment (Signed)
[facility] Discharge Suicide Risk Assessment   Principal Problem: Paranoid schizophrenia [facility]) Discharge Diagnoses: Principal Problem:   Paranoid schizophrenia (HCC) Active Problems:   Schizoaffective disorder (HCC)   Total Time spent with patient: 20 minutes  Musculoskeletal: Strength & Muscle Tone: within normal limits Gait & Station: normal Patient leans: N/A  Psychiatric Specialty Exam: Review of Systems  Constitutional: Negative for appetite change, diaphoresis and fever.  HENT: Negative for congestion and sore throat.   Respiratory: Negative for cough and shortness of breath.   Gastrointestinal: Negative for constipation, diarrhea, nausea and vomiting.  Genitourinary: Negative for difficulty urinating.  Musculoskeletal: Negative for arthralgias and myalgias.  Neurological: Negative for dizziness, light-headedness and headaches.  Psychiatric/Behavioral: Negative for dysphoric mood, hallucinations, self-injury, sleep disturbance and suicidal ideas. The patient is not nervous/anxious.     Blood pressure (!) 92/59, pulse 100, temperature 98.5 F (36.9 C), temperature source Oral, resp. rate 20, height 5\' 4"  (1.626 m), weight 101.2 kg, SpO2 100 %.Body mass index is 38.28 kg/m.  General Appearance: Casual and Fairly Groomed  ::  Fair  Speech:  Normal Rate  Volume:  Normal  Mood:  Euthymic  Affect:  Blunt  Thought Process:  Coherent and Goal Directed  Orientation:  Other:  oriented to self, situation; does not know date. Baseline orientation  Thought Content:  Logical and no referential thinking, paranoid ideation or delusional content elicited.  Denies hallucinations  Suicidal Thoughts:  No  Homicidal Thoughts:  No  Memory:  Fair  Judgement:  Other:  Limited  Insight:  Lacking  Psychomotor Activity:  Normal  Concentration:  Fair  Recall:  Fair  Fund of Knowledge:Limited  Language: Fair  Akathisia:  No    AIMS (if indicated):     Assets:  Desire for  Improvement Housing Leisure Time Resilience Social Support  Sleep:  Number of Hours: 6  Cognition: baseline for patient  ADL's:  Intact   Mental Status Per Nursing Assessment::   On Admission:  NA  Demographic Factors:  Low socioeconomic status and Unemployed  Loss Factors: Financial problems/change in socioeconomic status  Historical Factors: Impulsivity  Risk Reduction Factors:   Responsible for children under 7 years of age, Sense of responsibility to family, Living with another person, especially a relative and Positive social support  Continued Clinical Symptoms:  Schizophrenia:   Less than 59 years old Paranoid or undifferentiated type Previous Psychiatric Diagnoses and Treatments Medical Diagnoses and Treatments/Surgeries  Cognitive Features That Contribute To Risk:  Limited problem solving skills    Suicide Risk:  Mild:  Suicidal ideation of limited frequency, intensity, duration, and specificity.  There are no identifiable plans, no associated intent, mild dysphoria and related symptoms, good self-control (both objective and subjective assessment), few other risk factors, and identifiable protective factors, including available and accessible social support.   Follow-up Information    [facility]. Call.   Why: The provider will call you to schedule an appointment for therapy and medication management services. Contact information: [address] [place], [place]  Phone: [phone] Fax: [phone]       [facility] Follow up.   Why: A referral has been made on your behalf for ACTT services. You have an intake appointment on Thursday, 01/17/2021 at 11am.  Contact information: [address] [place] [phone]               Plan Of Care/Follow-up recommendations:  Activity:  as tolerated Other:  take medications as prescribed.  Keep appointments with psychiatrist, therapist and ACT team.  Do  not drink alcohol or use substances of abuse.  See primary care provider and cardiologist.  Follow up with Dental provider regarding getting dentures  Claudie Revering, MD 01/14/2021, 10:23 AM

## 2021-01-14 NOTE — Discharge Instructions (Signed)
If patient is not willing to have Risperdal Perseris IM, please switch to [place] or Risperdal Consta.

## 2021-01-14 NOTE — Progress Notes (Signed)
  [facility] Adult Case Management Discharge Plan :  Will you be returning to the same living situation after discharge:  Yes,  to home with parents At discharge, do you have transportation home?: No. Safe Transport to be arranged  Do you have the ability to pay for your medications: Yes,  has insurance   Release of information consent forms completed and in the chart;  Patient's signature needed at discharge.  Patient to Follow up at:  Follow-up Information    [facility]. Call.   Why: The provider will call you to schedule an appointment for therapy and medication management services. Contact information: [address] [place], [place] [address]  Phone: [phone] Fax: [phone]       [facility] Follow up.   Why: A referral has been made on your behalf for ACTT services. You have an intake appointment on Thursday, 01/17/2021 at 11am.  Contact information: [address] [place] [place] [address] [phone]        [facility]. Go to.   Why: An appointment has been made for you on Monday, 04/01/2021 at 9am.  Contact information: [address]  [place], [place] [address]  Telephone: [phone]               Next level of care provider has access to [facility] Link:no  Safety Planning and Suicide Prevention discussed: Yes,  with mother  Have you used any form of tobacco in the last 30 days? (Cigarettes, Smokeless Tobacco, Cigars, and/or Pipes): No  Has patient been referred to the Quitline?: N/A patient is not a smoker  Patient has been referred for addiction treatment: N/A  Otelia Santee, LCSW 01/14/2021, 10:32 AM

## 2021-01-14 NOTE — Plan of Care (Signed)
Pt was unable to focus on task/topic during the recreation therapy group attended.  Caroll Rancher, LRT/CTRS

## 2021-01-14 NOTE — Progress Notes (Signed)
Pt discharged to lobby. Pt was stable and appreciative at that time. All papers and prescriptions were given and valuables returned. Verbal understanding expressed. Denies SI/HI and A/VH. Pt given opportunity to express concerns and ask questions.  

## 2021-01-14 NOTE — Progress Notes (Signed)
Recreation Therapy Notes  INPATIENT RECREATION TR PLAN  Patient Details Name: [name] MRN: [id number] DOB: [birth year] Today's Date: 01/14/2021  Rec Therapy Plan Is patient appropriate for Therapeutic Recreation?: Yes Treatment times per week: about 3 days Estimated Length of Stay: 5-7 days TR Treatment/Interventions: Group participation (Comment)  Discharge Criteria Pt will be discharged from therapy if:: Discharged Treatment plan/goals/alternatives discussed and agreed upon by:: Patient/family  Discharge Summary Short term goals set: See patient care plan Short term goals met: Not met Progress toward goals comments: Groups attended Which groups?: Other (Comment) (Team Building) Reason goals not met: Pt unable to focus in group. Therapeutic equipment acquired: N/A Reason patient discharged from therapy: Discharge from hospital Pt/family agrees with progress & goals achieved: Yes Date patient discharged from therapy: 01/14/21    Kathy Brown, LRT/CTRS   Ria Comment, Marjette A 01/14/2021, 12:24 PM

## 2021-01-22 ENCOUNTER — Telehealth: Payer: Self-pay | Admitting: Cardiovascular Disease

## 2021-01-22 DIAGNOSIS — I4891 Unspecified atrial fibrillation: Secondary | ICD-10-CM | POA: Diagnosis not present

## 2021-01-22 NOTE — Telephone Encounter (Signed)
Sent to D.O.D to discuss

## 2021-01-22 NOTE — Telephone Encounter (Signed)
Left detail message on voicemail.  Appointment arranged for  02/06/21 at 8 am .  Left message for patient to contact office if she is unable to make appointment.

## 2021-01-22 NOTE — Telephone Encounter (Signed)
   STAT if HR is under 50 or over 120 (normal HR is 60-100 beats per minute)  1) What is your heart rate? 130  2) Do you have a log of your heart rate readings (document readings)? PCP does not have any other readings   3) Do you have any other symptoms? No. Patient is asymptomatic   PCP has patient at their office now and wanted advice from Cardiology

## 2021-01-22 NOTE — Telephone Encounter (Signed)
Spoke with [place] - she restarted meds, patient off them. Needs follow-up with APP or Dr. Salena Saner - can you contact scheduling or arrange?  Thanks.  [place]

## 2021-02-06 ENCOUNTER — Ambulatory Visit: Payer: Medicare Other | Admitting: Cardiovascular Disease

## 2021-02-07 DIAGNOSIS — R451 Restlessness and agitation: Secondary | ICD-10-CM | POA: Diagnosis not present

## 2021-02-11 ENCOUNTER — Other Ambulatory Visit (HOSPITAL_COMMUNITY): Payer: Self-pay | Admitting: Psychiatry

## 2021-03-16 DIAGNOSIS — Z20822 Contact with and (suspected) exposure to covid-19: Secondary | ICD-10-CM | POA: Diagnosis not present

## 2021-10-05 ENCOUNTER — Emergency Department (HOSPITAL_COMMUNITY)
Admission: EM | Admit: 2021-10-05 | Discharge: 2021-10-05 | Disposition: A | Discharge status: Home / Self Care | Payer: Medicare Other | Attending: Emergency Medicine | Admitting: Emergency Medicine

## 2021-10-05 ENCOUNTER — Encounter (HOSPITAL_COMMUNITY): Payer: Self-pay | Admitting: Emergency Medicine

## 2021-10-05 ENCOUNTER — Other Ambulatory Visit: Payer: Self-pay

## 2021-10-05 DIAGNOSIS — R9431 Abnormal electrocardiogram [ECG] [EKG]: Secondary | ICD-10-CM | POA: Diagnosis not present

## 2021-10-05 DIAGNOSIS — R112 Nausea with vomiting, unspecified: Secondary | ICD-10-CM | POA: Insufficient documentation

## 2021-10-05 DIAGNOSIS — R1084 Generalized abdominal pain: Secondary | ICD-10-CM | POA: Insufficient documentation

## 2021-10-05 DIAGNOSIS — R0689 Other abnormalities of breathing: Secondary | ICD-10-CM | POA: Diagnosis not present

## 2021-10-05 DIAGNOSIS — Z79899 Other long term (current) drug therapy: Secondary | ICD-10-CM | POA: Diagnosis not present

## 2021-10-05 LAB — CBC WITH DIFFERENTIAL/PLATELET
Abs Immature Granulocytes: 0.02 10*3/uL (ref 0.00–0.07)
Basophils Absolute: 0.1 10*3/uL (ref 0.0–0.1)
Basophils Relative: 1 %
Eosinophils Absolute: 0 10*3/uL (ref 0.0–0.5)
Eosinophils Relative: 0 %
HCT: 38.5 % (ref 36.0–46.0)
Hemoglobin: 12.1 g/dL (ref 12.0–15.0)
Immature Granulocytes: 0 %
Lymphocytes Relative: 35 %
Lymphs Abs: 2 10*3/uL (ref 0.7–4.0)
MCH: 25.6 pg — ABNORMAL LOW (ref 26.0–34.0)
MCHC: 31.4 g/dL (ref 30.0–36.0)
MCV: 81.6 fL (ref 80.0–100.0)
Monocytes Absolute: 0.5 10*3/uL (ref 0.1–1.0)
Monocytes Relative: 8 %
Neutro Abs: 3.1 10*3/uL (ref 1.7–7.7)
Neutrophils Relative %: 56 %
Platelets: 223 10*3/uL (ref 150–400)
RBC: 4.72 MIL/uL (ref 3.87–5.11)
RDW: 17.2 % — ABNORMAL HIGH (ref 11.5–15.5)
WBC: 5.6 10*3/uL (ref 4.0–10.5)
nRBC: 0 % (ref 0.0–0.2)

## 2021-10-05 LAB — COMPREHENSIVE METABOLIC PANEL
ALT: 11 U/L (ref 0–44)
AST: 18 U/L (ref 15–41)
Albumin: 3.9 g/dL (ref 3.5–5.0)
Alkaline Phosphatase: 37 U/L — ABNORMAL LOW (ref 38–126)
Anion gap: 12 (ref 5–15)
BUN: 19 mg/dL (ref 6–20)
CO2: 24 mmol/L (ref 22–32)
Calcium: 8.9 mg/dL (ref 8.9–10.3)
Chloride: 97 mmol/L — ABNORMAL LOW (ref 98–111)
Creatinine, Ser: 0.93 mg/dL (ref 0.44–1.00)
GFR, Estimated: >60 mL/min (ref >60)
Glucose, Bld: 186 mg/dL — ABNORMAL HIGH (ref 70–99)
Potassium: 3.2 mmol/L — ABNORMAL LOW (ref 3.5–5.1)
Sodium: 133 mmol/L — ABNORMAL LOW (ref 135–145)
Total Bilirubin: 0.8 mg/dL (ref 0.3–1.2)
Total Protein: 7.5 g/dL (ref 6.5–8.1)

## 2021-10-05 LAB — URINALYSIS, ROUTINE W REFLEX MICROSCOPIC
Bilirubin Urine: NEGATIVE
Glucose, UA: NEGATIVE mg/dL
Hgb urine dipstick: NEGATIVE
Ketones, ur: 20 mg/dL — AB
Nitrite: NEGATIVE
Protein, ur: NEGATIVE mg/dL
Specific Gravity, Urine: 1.011 (ref 1.005–1.030)
pH: 6 (ref 5.0–8.0)

## 2021-10-05 LAB — LIPASE, BLOOD: Lipase: 29 U/L (ref 11–51)

## 2021-10-05 LAB — RAPID URINE DRUG SCREEN, HOSP PERFORMED
Amphetamines: NOT DETECTED
Barbiturates: NOT DETECTED
Benzodiazepines: NOT DETECTED
Cocaine: NOT DETECTED
Opiates: NOT DETECTED
Tetrahydrocannabinol: NOT DETECTED

## 2021-10-05 LAB — I-STAT BETA HCG BLOOD, ED (MC, WL, AP ONLY): I-stat hCG, quantitative: <5 m[IU]/mL (ref <5)

## 2021-10-05 MED ORDER — ONDANSETRON HCL 4 MG/2ML IJ SOLN
4.0000 mg | Freq: Once | INTRAMUSCULAR | Status: AC
  Administered 2021-10-05: 4 mg via INTRAVENOUS
  Filled 2021-10-05: qty 2

## 2021-10-05 MED ORDER — SODIUM CHLORIDE 0.9 % IV BOLUS
1000.0000 mL | Freq: Once | INTRAVENOUS | Status: AC
  Administered 2021-10-05: 1000 mL via INTRAVENOUS

## 2021-10-05 MED ORDER — POTASSIUM CHLORIDE CRYS ER 20 MEQ PO TBCR
40.0000 meq | EXTENDED_RELEASE_TABLET | Freq: Once | ORAL | Status: AC
  Administered 2021-10-05: 40 meq via ORAL
  Filled 2021-10-05: qty 2

## 2021-10-05 MED ORDER — ONDANSETRON HCL 4 MG PO TABS: 4.0000 mg | ORAL_TABLET | Freq: Four times a day (QID) | ORAL | 0 refills | Status: DC

## 2021-10-05 NOTE — ED Provider Notes (Signed)
[facility] DEPT Provider Note   CSN: [id number] Arrival date & time: 10/05/21  1804     History Chief Complaint  Patient presents with   Abdominal Pain   Nausea   Emesis    Kathy Brown is a 40 y.o. female.  40 year old female with prior medical history as detailed below presents for evaluation.  Patient reports that she has been throwing up for 1 week.  She reports that she throws up "every day."  She cannot quantitate the number of times that she has had emesis today.  She cannot report the last time she had a bowel movement.  She complains of diffuse abdominal crampy discomfort.  She denies fever.  She does not take anything at home for her symptoms.  The history is provided by the patient and medical records.  Emesis Severity:  Moderate Duration:  8 days Quality:  Stomach contents Able to tolerate:  Liquids Progression:  Unchanged Chronicity:  New Recent urination:  Normal     Past Medical History:  Diagnosis Date   A-fib ([place])    Anxiety    Atrial fibrillation with RVR ([facility]) 10/15/2017   Auditory hallucinations    Chronic back pain    Dysrhythmia    Atrial fibrillation   Paranoid schizophrenia ([place])    Psychosis ([place]) 03/25/2014   Schizoaffective disorder ([place]) 03/25/2014   Schizophrenia ([place])    Urinary incontinence     Patient Active Problem List   Diagnosis Date Noted   A-fib ([place]) 09/08/2018   Atrial fibrillation with RVR ([place]) 10/15/2017   Auditory hallucinations    Paranoid schizophrenia ([place])    Schizoaffective disorder ([place]) 03/25/2014   Psychosis ([place]) 03/25/2014    Past Surgical History:  Procedure Laterality Date   TOOTH EXTRACTION Bilateral 09/08/2018   Procedure: DENTAL RESTORATION/EXTRACTIONS;  Surgeon: Diona Browner, DDS;  Location: [facility];  Service: Oral Surgery;  Laterality: Bilateral;     OB History     Gravida      Para      Term      Preterm      AB      Living  3       SAB      IAB      Ectopic      Multiple      Live Births              Family History  Problem Relation Age of Onset   Hypertension Mother    Diabetes Mother     Social History   Tobacco Use   Smoking status: Never   Smokeless tobacco: Never  Vaping Use   Vaping Use: Never used  Substance Use Topics   Alcohol use: No   Drug use: No    Home Medications Prior to Admission medications   Medication Sig Start Date End Date Taking? Authorizing Provider  benztropine (COGENTIN) 0.5 MG tablet Take 1 tablet (0.5 mg total) by mouth 2 (two) times daily as needed for tremors (Extraparametal side effects). 01/14/21   Ethelene Hal, NP  diltiazem (CARDIZEM CD) 120 MG 24 hr capsule Take 1 capsule (120 mg total) by mouth daily. 01/15/21   Ethelene Hal, NP  hydrOXYzine (ATARAX/VISTARIL) 10 MG tablet Take 1 tablet (10 mg total) by mouth 3 (three) times daily as needed for anxiety. 01/14/21   Ethelene Hal, NP  risperidone (RISPERDAL M-TABS) 3 MG disintegrating tablet Take 1 tablet (3 mg total) by mouth  at bedtime. 01/14/21   Laveda Abbe, NP  traZODone (DESYREL) 50 MG tablet Take 1 tablet (50 mg total) by mouth at bedtime and may repeat dose one time if needed. 01/14/21   Laveda Abbe, NP    Allergies    Patient has no known allergies.  Review of Systems   Review of Systems  All other systems reviewed and are negative.  Physical Exam Updated Vital Signs BP (!) 110/97 (BP Location: Left Arm)   Pulse 89   Temp 98.4 F (36.9 C) (Oral)   Resp 16   Ht 5\' 4"  (1.626 m)   Wt 77.1 kg   SpO2 100%   BMI 29.18 kg/m   Physical Exam Vitals and nursing note reviewed.  Constitutional:      General: She is not in acute distress.    Appearance: Normal appearance. She is well-developed.  HENT:     Head: Normocephalic and atraumatic.  Eyes:     Conjunctiva/sclera: Conjunctivae normal.     Pupils: Pupils are equal, round, and reactive to  light.  Cardiovascular:     Rate and Rhythm: Normal rate and regular rhythm.     Heart sounds: Normal heart sounds.  Pulmonary:     Effort: Pulmonary effort is normal. No respiratory distress.     Breath sounds: Normal breath sounds.  Abdominal:     General: There is no distension.     Palpations: Abdomen is soft.     Tenderness: There is no abdominal tenderness.  Musculoskeletal:        General: No deformity. Normal range of motion.     Cervical back: Normal range of motion and neck supple.  Skin:    General: Skin is warm and dry.  Neurological:     General: No focal deficit present.     Mental Status: She is alert and oriented to person, place, and time.    ED Results / Procedures / Treatments   Labs (all labs ordered are listed, but only abnormal results are displayed) Labs Reviewed  CBC WITH DIFFERENTIAL/PLATELET  COMPREHENSIVE METABOLIC PANEL  LIPASE, BLOOD    EKG None  Radiology No results found.  Procedures Procedures   Medications Ordered in ED Medications  ondansetron (ZOFRAN) injection 4 mg (has no administration in time range)  sodium chloride 0.9 % bolus 1,000 mL (has no administration in time range)    ED Course  I have reviewed the triage vital signs and the nursing notes.  Pertinent labs & imaging results that were available during my care of the patient were reviewed by me and considered in my medical decision making (see chart for details).    MDM Rules/Calculators/A&P                           MDM  MSE complete  Kathy Brown was evaluated in Emergency Department on 10/05/2021 for the symptoms described in the history of present illness. She was evaluated in the context of the global COVID-19 pandemic, which necessitated consideration that the patient might be at risk for infection with the SARS-CoV-2 virus that causes COVID-19. Institutional protocols and algorithms that pertain to the evaluation of patients at risk for COVID-19 are in a  state of rapid change based on information released by regulatory bodies including the CDC and federal and state organizations. These policies and algorithms were followed during the patient's care in the ED.   Patient presented with complaint of nausea and  associated vomiting.  Patient was given IV fluids and antiemetics.  Screening labs reveal evidence of mild hypokalemia with potassium 3.2.  Oral potassium supplementation was given and patient tolerated this well.  Other screening labs obtained are without significant abnormality  Patient feels improved.    She understands need for close follow-up.  Strict return precautions given and understood.        Final Clinical Impression(s) / ED Diagnoses Final diagnoses:  Nausea and vomiting, unspecified vomiting type    Rx / DC Orders ED Discharge Orders          Ordered    ondansetron (ZOFRAN) 4 MG tablet  Every 6 hours        10/05/21 2216             Valarie Merino, MD 10/05/21 2218

## 2021-10-05 NOTE — Discharge Instructions (Addendum)
Return for any problem.  ?

## 2021-10-05 NOTE — ED Provider Notes (Signed)
Emergency Medicine Provider Triage Evaluation Note  Artelia Game , a 40 y.o. female  was evaluated in triage.  Pt complains of vomiting and abdominal pain  Review of Systems  Positive: Vomiting blood  Negative: Fever,   Physical Exam  BP (!) 110/97 (BP Location: Left Arm)   Pulse 89   Temp 98.4 F (36.9 C) (Oral)   Resp 16   Ht 5\' 4"  (1.626 m)   Wt 77.1 kg   SpO2 100%   BMI 29.18 kg/m  Gen:   Awake, no distress   Resp:  Normal effort  MSK:   Moves extremities without difficulty  Other:    Medical Decision Making  Medically screening exam initiated at 6:43 PM.  Appropriate orders placed.  Boyd Litaker was informed that the remainder of the evaluation will be completed by another provider, this initial triage assessment does not replace that evaluation, and the importance of remaining in the ED until their evaluation is complete.     [name], [place] Areas 10/05/21 1844    14/10/22, MD 10/05/21 2252

## 2021-10-05 NOTE — ED Triage Notes (Signed)
BIB EMS from home, EMS reports initially pt had a panic attack, now abdominal pain, hx of schizophrenia, non-compliant w/ meds.

## 2021-11-21 ENCOUNTER — Ambulatory Visit (HOSPITAL_COMMUNITY)
Admission: EM | Admit: 2021-11-21 | Discharge: 2021-11-22 | Disposition: A | Discharge status: Home / Self Care | Payer: Medicare Other | Attending: Psychiatry | Admitting: Psychiatry

## 2021-11-21 ENCOUNTER — Ambulatory Visit: Payer: Self-pay

## 2021-11-21 DIAGNOSIS — Z20822 Contact with and (suspected) exposure to covid-19: Secondary | ICD-10-CM | POA: Insufficient documentation

## 2021-11-21 DIAGNOSIS — F259 Schizoaffective disorder, unspecified: Secondary | ICD-10-CM | POA: Diagnosis present

## 2021-11-21 DIAGNOSIS — F200 Paranoid schizophrenia: Secondary | ICD-10-CM | POA: Insufficient documentation

## 2021-11-21 DIAGNOSIS — F203 Undifferentiated schizophrenia: Secondary | ICD-10-CM

## 2021-11-21 DIAGNOSIS — F209 Schizophrenia, unspecified: Secondary | ICD-10-CM

## 2021-11-21 DIAGNOSIS — T50916A Underdosing of multiple unspecified drugs, medicaments and biological substances, initial encounter: Secondary | ICD-10-CM | POA: Insufficient documentation

## 2021-11-21 DIAGNOSIS — Z91128 Patient's intentional underdosing of medication regimen for other reason: Secondary | ICD-10-CM | POA: Diagnosis not present

## 2021-11-21 LAB — POCT URINE DRUG SCREEN - MANUAL ENTRY (I-SCREEN)
POC Amphetamine UR: NOT DETECTED
POC Buprenorphine (BUP): NOT DETECTED
POC Cocaine UR: NOT DETECTED
POC Marijuana UR: NOT DETECTED
POC Methadone UR: NOT DETECTED
POC Methamphetamine UR: NOT DETECTED
POC Morphine: NOT DETECTED
POC Oxazepam (BZO): NOT DETECTED
POC Oxycodone UR: NOT DETECTED
POC Secobarbital (BAR): NOT DETECTED

## 2021-11-21 LAB — RESP PANEL BY RT-PCR (FLU A&B, COVID) ARPGX2
Influenza A by PCR: NEGATIVE
Influenza B by PCR: NEGATIVE
SARS Coronavirus 2 by RT PCR: NEGATIVE

## 2021-11-21 LAB — POC SARS CORONAVIRUS 2 AG -  ED: SARS Coronavirus 2 Ag: NEGATIVE

## 2021-11-21 MED ORDER — RISPERIDONE 1 MG PO TBDP
1.0000 mg | ORAL_TABLET | Freq: Two times a day (BID) | ORAL | Status: DC
  Administered 2021-11-21 – 2021-11-22 (×3): 1 mg via ORAL
  Filled 2021-11-21 (×3): qty 1

## 2021-11-21 MED ORDER — ZIPRASIDONE MESYLATE 20 MG IM SOLR: 20.0000 mg | INTRAMUSCULAR | Status: DC | PRN

## 2021-11-21 MED ORDER — MAGNESIUM HYDROXIDE 400 MG/5ML PO SUSP: 30.0000 mL | Freq: Every day | ORAL | Status: DC | PRN

## 2021-11-21 MED ORDER — ALUM & MAG HYDROXIDE-SIMETH 200-200-20 MG/5ML PO SUSP: 30.0000 mL | ORAL | Status: DC | PRN

## 2021-11-21 MED ORDER — ACETAMINOPHEN 325 MG PO TABS: 650.0000 mg | ORAL_TABLET | Freq: Four times a day (QID) | ORAL | Status: DC | PRN

## 2021-11-21 MED ORDER — RISPERIDONE 2 MG PO TBDP: 2.0000 mg | ORAL_TABLET | Freq: Three times a day (TID) | ORAL | Status: DC | PRN

## 2021-11-21 MED ORDER — BENZTROPINE MESYLATE 0.5 MG PO TABS: 0.5000 mg | ORAL_TABLET | Freq: Two times a day (BID) | ORAL | Status: DC | PRN

## 2021-11-21 MED ORDER — HYDROXYZINE HCL 25 MG PO TABS
25.0000 mg | ORAL_TABLET | Freq: Three times a day (TID) | ORAL | Status: DC | PRN
  Filled 2021-11-21: qty 10

## 2021-11-21 MED ORDER — LORAZEPAM 1 MG PO TABS: 1.0000 mg | ORAL_TABLET | ORAL | Status: DC | PRN

## 2021-11-21 MED ORDER — TRAZODONE HCL 50 MG PO TABS
50.0000 mg | ORAL_TABLET | Freq: Every evening | ORAL | Status: DC | PRN
  Filled 2021-11-21 (×2): qty 7

## 2021-11-21 NOTE — Telephone Encounter (Signed)
°  Chief Complaint: acting out behavior Symptoms: hollering, screaming, seeing people, confused, refusing meds and not eating Frequency: 1 week Pertinent Negatives: NA Disposition: [x] ED /[] Urgent Care (no appt availability in office) / [] Appointment(In office/virtual)/ []  [facility]/ [] Home Care/ [] Refused Recommended Disposition /[] [facility]/ []  Follow-up with PCP Additional Notes: Pt's mother called in, she was told by PCP to contact hospital across from [facility] and take pt there. Was able to give mother BH direct number and transferred her to their number.    Reason for Disposition  Very strange or paranoid behavior  Answer Assessment - Initial Assessment Questions 1. LEVEL OF CONSCIOUSNESS: "How is he (she, the patient) acting right now?" (e.g., alert-oriented, confused, lethargic, stuporous, comatose)     Confused, hollering 2. ONSET: "When did the confusion start?"  (minutes, hours, days)     1 week 3. PATTERN "Does this come and go, or has it been constant since it started?"  "Is it present now?"     Pretty constant 4. ALCOHOL or DRUGS: "Has he been drinking alcohol or taking any drugs?"      No 5. NARCOTIC MEDICATIONS: "Has he been receiving any narcotic medications?" (e.g., morphine, Vicodin)     No 6. CAUSE: "What do you think is causing the confusion?"      unsure 7. OTHER SYMPTOMS: "Are there any other symptoms?" (e.g., difficulty breathing, headache, fever, weakness)     Screaming, seeing people, refusing to take medications or eat  Protocols used: Confusion - Delirium-A-AH

## 2021-11-21 NOTE — ED Notes (Signed)
Oriented x 3 , thought process is concrete with lag time to  repsonses and without paranoid or delusional content. Verbalizng feelings of depression , denies SI at present or HI . AVH , sleeping at intervals , will continue to monitor for safety

## 2021-11-21 NOTE — Progress Notes (Signed)
Patient was admitted to unit she is dysphoric with constricted affect.  She is soft spoken with limited eye contact.  RN attempted to draw blood work however was unsuccessful as patient is dehydrated.  Patient given tea and some food to eat and attempts to draw blood will be made later.  Patient calm and cooperative with admission process.  No distress or complaint.  Patient reports depression and anxiety with voices.  Encouraged patient to seek out staff if overwhelmed by thoughts or feelings.  Will monitor and provide safe supportive environment.

## 2021-11-21 NOTE — ED Notes (Signed)
Gave pt clean clothing and changed linen due to incontinent accident. Pt. nodded her head yes when I asked her was she incontinent.

## 2021-11-21 NOTE — BH Assessment (Signed)
Comprehensive Clinical Assessment (CCA) Note  11/21/2021 Kathy Brown [id number]  Per Doran Heater, NP, patient will be admitted for continuous assessment  The patient demonstrates the following risk factors for suicide: Chronic risk factors for suicide include: psychiatric disorder of schizophrenia . Acute risk factors for suicide include: N/A. Protective factors for this patient include: positive social support. Considering these factors, the overall suicide risk at this point appears to be low. Patient is not appropriate for outpatient follow up.   AIMS    Flowsheet Row [facility]  AIMS Total Score 0      AUDIT    Flowsheet Row Ad[facility] ([facility]  Alcohol Use Disorder Identification Test Final Score (AUDIT) 1 0      Flowsheet Row ED from 11/21/2021 in [facility] from 10/05/2021 in [facility] Ad[facility] (Discharged[facility]  C-SSRS RISK CATEGORY No Risk No Risk No Risk         Chief Complaint:  Chief Complaint  Patient presents with   Paranoid   Psychosis   Anxiety   Depression   Visit Diagnosis: F20.9 Schizophrenia   CCA Screening, Triage and Referral (STR)  Patient Reported Information How did you hear about [place]? Family/Friend  What Is the Reason for Your Visit/Call Today? Patient presents to the [facility] with her mother with complaints of anxiety and depression.  Patient states that she is hearing voices, but unable to identify what they are saying.  Patient states that she got really anxious today and felt like people were out to get her.  Patient has a previous history of psychosis and states that he has no OP provider  and she is not currently on medications.  Patient states that she is depressed, but denies SI/HI.  Patient appears to be cognitively delayed.  Patient does not appear to nbe a danger to herself or others.  Denies any SA use.  Patient appears to be routine and in need of medication management.  Patient's mother, Nadara Mustard, sates that patient was attending a Day Program until a year ago.  Patient stopped going to the program and was doing relatively well until two months ago when she stopped taking her medication.  Patient has been having conversations with herself, screaming and yelling at times, not bathing and generally acting very bizarre.    Patient is disorganized with loose thought associations.  Her judgment, insight and impulse control are impaired by her mental illness. She is alert, oriented to person, place, time, but not to her situation.  How Long Has This Been Causing You Problems? > than 6 months  What Do You Feel Would Help You the Most Today? Treatment for Depression or other mood problem   Have You Recently Had Any Thoughts About Hurting Yourself? No  Are You Planning to Commit Suicide/Harm Yourself At This time? No   Have you Recently Had Thoughts About Hurting Someone Kathy Brown? No  Are You Planning to Harm Someone at This Time? No  Explanation: No data recorded  Have You Used Any Alcohol or Drugs in the Past 24 Hours? No  How Long Ago Did You Use Drugs or Alcohol? No data recorded What Did You Use and How Much? No data recorded  Do You Currently Have a Therapist/Psychiatrist? No  data recorded Name of Therapist/Psychiatrist: No data recorded  Have You Been Recently Discharged From Any Office Practice or Programs? No data recorded Explanation of Discharge From Practice/Program: No data recorded    CCA Screening Triage Referral Assessment Type of Contact: No data recorded Telemedicine Service Delivery:   Is this Initial or Reassessment? No data recorded Date  Telepsych consult ordered in [facility]:  No data recorded Time Telepsych consult ordered in [facility]:  No data recorded Location of Assessment: No data recorded Provider Location: No data recorded  Collateral Involvement: No data recorded  Does Patient Have a [place]? No data recorded Name and Contact of Legal Guardian: No data recorded If Minor and Not Living with Parent(s), Who has Custody? No data recorded Is CPS involved or ever been involved? No data recorded Is APS involved or ever been involved? No data recorded  Patient Determined To Be At Risk for Harm To Self or Others Based on Review of Patient Reported Information or Presenting Complaint? No data recorded Method: No data recorded Availability of Means: No data recorded Intent: No data recorded Notification Required: No data recorded Additional Information for Danger to Others Potential: No data recorded Additional Comments for Danger to Others Potential: No data recorded Are There Guns or Other Weapons in Your Home? No data recorded Types of Guns/Weapons: No data recorded Are These Weapons Safely Secured?                            No data recorded Who Could Verify You Are Able To Have These Secured: No data recorded Do You Have any Outstanding Charges, Pending Court Dates, Parole/Probation? No data recorded Contacted To Inform of Risk of Harm To Self or Others: No data recorded   Does Patient Present under Involuntary Commitment? No data recorded IVC Papers Initial File Date: No data recorded  [place] of Residence: No data recorded  Patient Currently Receiving the Following Services: No data recorded  Determination of Need: Routine (7 days)   Options For Referral: Intensive Outpatient Therapy     CCA Biopsychosocial Patient Reported Schizophrenia/Schizoaffective Diagnosis in Past: Yes   Strengths: UTA   Mental Health Symptoms Depression:   None   Duration of Depressive symptoms:    Mania:    Irritability   Anxiety:    Worrying   Psychosis:   Grossly disorganized speech   Duration of Psychotic symptoms:  Duration of Psychotic Symptoms: Less than six months   Trauma:   None   Obsessions:   None   Compulsions:   None   Inattention:   None   Hyperactivity/Impulsivity:   N/A   Oppositional/Defiant Behaviors:   None   Emotional Irregularity:   None   Other Mood/Personality Symptoms:  No data recorded   Mental Status Exam Appearance and self-care  Stature:   Average   Weight:   Average weight   Clothing:   Disheveled (neglected hygiene)   Grooming:   Normal   Cosmetic use:   None   Posture/gait:   Normal   Motor activity:   Not Remarkable   Sensorium  Attention:   Normal   Concentration:   Anxiety interferes; Scattered   Orientation:   Person   Recall/memory:   Defective in Short-term; Defective in Immediate   Affect and Mood  Affect:   Anxious; Tearful   Mood:   Anxious   Relating  Eye contact:   Normal   Facial expression:  Anxious   Attitude toward examiner:   Cooperative; Guarded   Thought and Language  Speech flow:  Articulation error; Garbled; Pressured; Slurred   Thought content:   Appropriate to Mood and Circumstances   Preoccupation:   None   Hallucinations:   None   Organization:  No data recorded  [employer] ServicesExecutive Functions  Fund of Knowledge:   Impoverished by (Comment)   Intelligence:   Needs investigation   Abstraction:  No data recorded  Judgement:   Impaired   Reality Testing:   Distorted   Insight:   Lacking   Decision Making:   Confused   Social Functioning  Social Maturity:   Irresponsible   Social Judgement:   Normal   Stress  Stressors:   -- (none reported)   Coping Ability:   Overwhelmed; Exhausted   Skill Deficits:   Self-control   Supports:   Family; Friends/Service system     Religion: Religion/Spirituality Are You A Religious Person?:  (not  assessed) How Might This Affect Treatment?: N/A  Leisure/Recreation: Leisure / Recreation Do You Have Hobbies?: Yes Leisure and Hobbies: color, read  Exercise/Diet: Exercise/Diet Do You Exercise?: No Have You Gained or Lost A Significant Amount of Weight in the Past Six Months?: No Do You Follow a Special Diet?: No Do You Have Any Trouble Sleeping?: Yes Explanation of Sleeping Difficulties: 2-3 hours a night   CCA Employment/Education Employment/Work Situation: Employment / Work Systems developerituation Employment Situation: On disability Why is Patient on Disability: Mental Health How Long has Patient Been on Disability: Unsure how long Patient's Job has Been Impacted by Current Illness: No Has Patient ever Been in the [employer] BancorpMilitary?: No  Education: Education Is Patient Currently Attending School?: No Last Grade Completed:  (not assessed) Did You Attend College?: No Did You Have An Individualized Education Program (IIEP): No Did You Have Any Difficulty At School?: Yes Were Any Medications Ever Prescribed For These Difficulties?: No Patient's Education Has Been Impacted by Current Illness: No   CCA Family/Childhood History Family and Relationship History: Family history Marital status: Single Does patient have children?: Yes How many children?: 3 How is patient's relationship with their children?: Pt reports she does not have a relationship with her children and states that they are cared for by her mother  Childhood History:  Childhood History By whom was/is the patient raised?: Mother Did patient suffer any verbal/emotional/physical/sexual abuse as a child?: No Did patient suffer from severe childhood neglect?: No Has patient ever been sexually abused/assaulted/raped as an adolescent or adult?: No Was the patient ever a victim of a crime or a disaster?: No Witnessed domestic violence?: No Has patient been affected by domestic violence as an adult?: No  Child/Adolescent  Assessment:     CCA Substance Use Alcohol/Drug Use: Alcohol / Drug Use Pain Medications: See MAR Prescriptions: See MAR Over the Counter: See MAR History of alcohol / drug use?: No history of alcohol / drug abuse                         ASAM's:  Six Dimensions of Multidimensional Assessment  Dimension 1:  Acute Intoxication and/or Withdrawal Potential:      Dimension 2:  Biomedical Conditions and Complications:      Dimension 3:  Emotional, Behavioral, or Cognitive Conditions and Complications:     Dimension 4:  Readiness to Change:     Dimension 5:  Relapse, Continued use, or Continued Problem Potential:     Dimension 6:  Recovery/Living Environment:     ASAM Severity Score:    ASAM Recommended Level of Treatment:     Substance use Disorder (SUD)    Recommendations for Services/Supports/Treatments:    Discharge Disposition:    DSM5 Diagnoses: Patient Active Problem List   Diagnosis Date Noted   A-fib ([place]) 09/08/2018   Atrial fibrillation with RVR ([place]) 10/15/2017   Auditory hallucinations    Paranoid schizophrenia ([place])    Schizoaffective disorder ([place]) 03/25/2014   Psychosis ([place]) 03/25/2014     Referrals to Alternative Service(s): Referred to Alternative Service(s):   Place:   Date:   Time:    Referred to Alternative Service(s):   Place:   Date:   Time:    Referred to Alternative Service(s):   Place:   Date:   Time:    Referred to Alternative Service(s):   Place:   Date:   Time:     Cindi Ghazarian J Aamirah Salmi, LCAS

## 2021-11-21 NOTE — ED Notes (Signed)
Appears to be resting quietly at this time , respirations even and unlabored , no distress noted , will continue to monitor for safety . °

## 2021-11-21 NOTE — ED Provider Notes (Signed)
Behavioral Health Admission H&P [facility] & OBS)  Date: 11/21/21 Patient Name: Kathy Brown MRN: [id number] Chief Complaint:  Chief Complaint  Patient presents with   Paranoid   Psychosis   Anxiety   Depression      Diagnoses:  Final diagnoses:  Schizophrenia, unspecified type [facility])    HPI: Patient presents voluntarily to [facility] behavioral health for walk-in assessment.  Patient is accompanied by her mother, Rica Mast, who remains present during assessment per patient preference.   Kathy Brown states "I am scared, I have nightmares." She is a limited historian at this time.   Patient shares that recent stressors include ongoing nightmares and difficulty sleeping.  She also reports auditory hallucinations, "I hear a lot of voices." No command hallucinations reported.  She presents with tangential conversation.  States "my bed is too hard and my room is so cold, my mouth hurts all the time."  Patient is assessed face-to-face by nurse practitioner.  She is seated in assessment area, no acute distress.  She is alert and oriented, pleasant and cooperative during assessment.   Kathy Brown has been diagnosed with paranoid schizophrenia and schizoaffective disorder. She taking her medication approximately 2 months ago.  She is not linked with outpatient psychiatry at this time.  1 year ago she stopped going to her day program that also assisted with medication.  For several months she voluntarily took her medication when offered by her mother.  2 months ago she began refusing all medications.  She does have history of inpatient psychiatric hospitalizations.  Most recent psychiatric hospitalization March 2022 at [facility] behavioral health.  She presents with anxious mood, congruent affect.  She denies suicidal and homicidal ideations.   She has appropriate behavior.  No visual hallucinations reported.     Patient endorses decreased sleep and appetite.  Sleeping very little related to ongoing  nightmares.   Patient resides in [place] with her mother, her grandmother and her 3 children.  Denies access to weapons.  She is currently not employed.  She denies alcohol or substance use.  Patient offered support and encouragement.  Spoke with patient's mother, Dannielle Karvonen [phone], who shares that patient has not eaten, as she typically would, over the last 2 months.  She has observed patient, alone in her room, "hollering screaming at her self."   Patient's mother shares that patient had her remaining teeth removed 1 year ago.  Since that time she has lost her dentures.  Patient's mother reports patient chronically complains of gum playing related to chewing food with only her gums.      PHQ 2-9:   [facility] ED from 11/21/2021 in [facility] ED from 10/05/2021 in [place] DEPT Admission (Discharged) from 01/08/2021 in [place] [facility]  C-SSRS RISK CATEGORY No Risk No Risk No Risk        Total Time spent with patient: 20 minutes  Musculoskeletal  Strength & Muscle Tone: within normal limits Gait & Station: normal Patient leans: N/A  Psychiatric Specialty Exam  Presentation General Appearance: Appropriate for Environment; Casual  Eye Contact:Good  Speech:Clear and Coherent; Normal Rate  Speech Volume:Normal  Handedness:Right   Mood and Affect  Mood:Anxious  Affect:Congruent   Thought Process  Thought Processes:Coherent  Descriptions of Associations:Tangential  Orientation:Full (Time, Place and Person)  Thought Content:Tangential; Logical  Diagnosis of Schizophrenia or Schizoaffective disorder in past: Yes  Duration of Psychotic Symptoms: Greater than six months  Hallucinations:Hallucinations: Auditory Description of Auditory Hallucinations: "  I hear a lot of voices"  Ideas of Reference:None  Suicidal Thoughts:Suicidal Thoughts: No  Homicidal  Thoughts:Homicidal Thoughts: No   Sensorium  Memory:Immediate Fair; Recent Poor  Judgment:Impaired  Insight:Shallow   Executive Functions  Concentration:Fair  Attention Span:Fair  Home  Language:Good   Psychomotor Activity  Psychomotor Activity:Psychomotor Activity: Normal   Assets  Assets:Communication Skills; Desire for Improvement; Housing; Intimacy   Sleep  Sleep:Sleep: Poor   Nutritional Assessment (For OBS and FBC admissions only) Has the patient had a weight loss or gain of 10 pounds or more in the last 3 months?: No Has the patient had a decrease in food intake/or appetite?: Yes Does the patient have dental problems?: Yes Does the patient have eating habits or behaviors that may be indicators of an eating disorder including binging or inducing vomiting?: No Has the patient recently lost weight without trying?: 2.0 Has the patient been eating poorly because of a decreased appetite?: 0 Malnutrition Screening Tool Score: 2 Nutritional Assessment Referrals: Medication/Tx changes    Physical Exam Vitals and nursing note reviewed.  Constitutional:      Appearance: Normal appearance. She is well-developed and normal weight.  HENT:     Head: Normocephalic and atraumatic.     Nose: Nose normal.  Cardiovascular:     Rate and Rhythm: Normal rate.  Pulmonary:     Effort: Pulmonary effort is normal.  Musculoskeletal:        General: Normal range of motion.     Cervical back: Normal range of motion.  Skin:    General: Skin is warm and dry.  Neurological:     Mental Status: She is alert and oriented to person, place, and time.  Psychiatric:        Attention and Perception: Attention normal. She perceives auditory hallucinations.        Mood and Affect: Affect normal. Mood is anxious.        Speech: Speech is tangential.        Behavior: Behavior normal. Behavior is cooperative.        Thought Content: Thought content  normal.        Cognition and Memory: Cognition normal.   Review of Systems  Constitutional: Negative.   HENT: Negative.    Eyes: Negative.   Respiratory: Negative.    Cardiovascular: Negative.   Gastrointestinal: Negative.   Genitourinary: Negative.   Musculoskeletal: Negative.   Skin: Negative.   Neurological: Negative.   Endo/Heme/Allergies: Negative.   Psychiatric/Behavioral:  Positive for hallucinations. The patient is nervous/anxious and has insomnia.    Blood pressure 133/80, pulse 77, temperature 97.9 F (36.6 C), temperature source Oral, resp. rate 18, SpO2 100 %. There is no height or weight on file to calculate BMI.  Past Psychiatric History: Paranoid schizophrenia, schizoaffective disorder  Is the patient at risk to self? No  Has the patient been a risk to self in the past 6 months? No .    Has the patient been a risk to self within the distant past? No   Is the patient a risk to others? No   Has the patient been a risk to others in the past 6 months? No   Has the patient been a risk to others within the distant past? No   Past Medical History:  Past Medical History:  Diagnosis Date   A-fib [facility])    Anxiety    Atrial fibrillation with RVR ([place]) 10/15/2017   Auditory hallucinations  Chronic back pain    Dysrhythmia    Atrial fibrillation   Paranoid schizophrenia ([place])    Psychosis ([place]) 03/25/2014   Schizoaffective disorder ([place]) 03/25/2014   Schizophrenia ([place])    Urinary incontinence     Past Surgical History:  Procedure Laterality Date   TOOTH EXTRACTION Bilateral 09/08/2018   Procedure: DENTAL RESTORATION/EXTRACTIONS;  Surgeon: Diona Browner, DDS;  Location: [place];  Service: Oral Surgery;  Laterality: Bilateral;    Family History:  Family History  Problem Relation Age of Onset   Hypertension Mother    Diabetes Mother     Social History:  Social History   Socioeconomic History   Marital status: Single    Spouse name: Not  on file   Number of children: Not on file   Years of education: Not on file   Highest education level: Not on file  Occupational History   Not on file  Tobacco Use   Smoking status: Never   Smokeless tobacco: Never  Vaping Use   Vaping Use: Never used  Substance and Sexual Activity   Alcohol use: No   Drug use: No   Sexual activity: Not Currently    Birth control/protection: None  Other Topics Concern   Not on file  Social History Narrative   Lives mom, dad, grandmother, and three kids   Social Determinants of Health   Financial Resource Strain: Not on file  Food Insecurity: Not on file  Transportation Needs: Not on file  Physical Activity: Not on file  Stress: Not on file  Social Connections: Not on file  Intimate Partner Violence: Not on file    SDOH:  SDOH Screenings   Alcohol Screen: Low Risk    Last Alcohol Screening Score (AUDIT): 1  Depression (PHQ2-9): Not on file  Financial Resource Strain: Not on file  Food Insecurity: Not on file  Housing: Not on file  Physical Activity: Not on file  Social Connections: Not on file  Stress: Not on file  Tobacco Use: Low Risk    Smoking Tobacco Use: Never   Smokeless Tobacco Use: Never   Passive Exposure: Not on file  Transportation Needs: Not on file    Last Labs:  Admission on 10/05/2021, Discharged on 10/05/2021  Component Date Value Ref Range Status   WBC 10/05/2021 5.6  4.0 - 10.5 K/uL Final   RBC 10/05/2021 4.72  3.87 - 5.11 MIL/uL Final   Hemoglobin 10/05/2021 12.1  12.0 - 15.0 g/dL Final   HCT 10/05/2021 38.5  36.0 - 46.0 % Final   MCV 10/05/2021 81.6  80.0 - 100.0 fL Final   MCH 10/05/2021 25.6 (L)  26.0 - 34.0 pg Final   MCHC 10/05/2021 31.4  30.0 - 36.0 g/dL Final   RDW 10/05/2021 17.2 (H)  11.5 - 15.5 % Final   Platelets 10/05/2021 223  150 - 400 K/uL Final   nRBC 10/05/2021 0.0  0.0 - 0.2 % Final   Neutrophils Relative % 10/05/2021 56  % Final   Neutro Abs 10/05/2021 3.1  1.7 - 7.7 K/uL Final    Lymphocytes Relative 10/05/2021 35  % Final   Lymphs Abs 10/05/2021 2.0  0.7 - 4.0 K/uL Final   Monocytes Relative 10/05/2021 8  % Final   Monocytes Absolute 10/05/2021 0.5  0.1 - 1.0 K/uL Final   Eosinophils Relative 10/05/2021 0  % Final   Eosinophils Absolute 10/05/2021 0.0  0.0 - 0.5 K/uL Final   Basophils Relative 10/05/2021 1  %  Final   Basophils Absolute 10/05/2021 0.1  0.0 - 0.1 K/uL Final   Immature Granulocytes 10/05/2021 0  % Final   Abs Immature Granulocytes 10/05/2021 0.02  0.00 - 0.07 K/uL Final   Performed at [facility], [place] [address] [place], [place] [address]   Sodium 10/05/2021 133 (L)  135 - 145 mmol/L Final   Potassium 10/05/2021 3.2 (L)  3.5 - 5.1 mmol/L Final   Chloride 10/05/2021 97 (L)  98 - 111 mmol/L Final   CO2 10/05/2021 24  22 - 32 mmol/L Final   Glucose, Bld 10/05/2021 186 (H)  70 - 99 mg/dL Final   Glucose reference range applies only to samples taken after fasting for at least 8 hours.   BUN 10/05/2021 19  6 - 20 mg/dL Final   Creatinine, Ser 10/05/2021 0.93  0.44 - 1.00 mg/dL Final   Calcium 10/05/2021 8.9  8.9 - 10.3 mg/dL Final   Total Protein 10/05/2021 7.5  6.5 - 8.1 g/dL Final   Albumin 10/05/2021 3.9  3.5 - 5.0 g/dL Final   AST 10/05/2021 18  15 - 41 U/L Final   ALT 10/05/2021 11  0 - 44 U/L Final   Alkaline Phosphatase 10/05/2021 37 (L)  38 - 126 U/L Final   Total Bilirubin 10/05/2021 0.8  0.3 - 1.2 mg/dL Final   GFR, Estimated 10/05/2021 >60  >60 mL/min Final   Comment: (NOTE) Calculated using the CKD-EPI Creatinine Equation (2021)    Anion gap 10/05/2021 12  5 - 15 Final   Performed at [facility], [place] [address], [place], [place] [address]   I-stat hCG, quantitative 10/05/2021 <5.0  <5 mIU/mL Final   Comment 3 10/05/2021          Final   Comment:   GEST. AGE      CONC.  (mIU/mL)   <=1 WEEK        5 - 50     2 WEEKS       50 - 500     3 WEEKS       100 - 10,000     4 WEEKS     1,000 - 30,000         FEMALE AND NON-PREGNANT FEMALE:     LESS THAN 5 mIU/mL    Color, Urine 10/05/2021 YELLOW  YELLOW Final   APPearance 10/05/2021 HAZY (A)  CLEAR Final   Specific Gravity, Urine 10/05/2021 1.011  1.005 - 1.030 Final   pH 10/05/2021 6.0  5.0 - 8.0 Final   Glucose, UA 10/05/2021 NEGATIVE  NEGATIVE mg/dL Final   Hgb urine dipstick 10/05/2021 NEGATIVE  NEGATIVE Final   Bilirubin Urine 10/05/2021 NEGATIVE  NEGATIVE Final   Ketones, ur 10/05/2021 20 (A)  NEGATIVE mg/dL Final   Protein, ur 10/05/2021 NEGATIVE  NEGATIVE mg/dL Final   Nitrite 10/05/2021 NEGATIVE  NEGATIVE Final   Leukocytes,Ua 10/05/2021 MODERATE (A)  NEGATIVE Final   RBC / HPF 10/05/2021 6-10  0 - 5 RBC/hpf Final   WBC, UA 10/05/2021 21-50  0 - 5 WBC/hpf Final   Bacteria, UA 10/05/2021 RARE (A)  NONE SEEN Final   Squamous Epithelial / LPF 10/05/2021 6-10  0 - 5 Final   WBC Clumps 10/05/2021 PRESENT   Final   Mucus 10/05/2021 PRESENT   Final   Hyaline Casts, UA 10/05/2021 PRESENT   Final   Performed at [facility], [place] [address], [place], [place] [address]   Opiates 10/05/2021  NONE DETECTED  NONE DETECTED Final   Cocaine 10/05/2021 NONE DETECTED  NONE DETECTED Final   Benzodiazepines 10/05/2021 NONE DETECTED  NONE DETECTED Final   Amphetamines 10/05/2021 NONE DETECTED  NONE DETECTED Final   Tetrahydrocannabinol 10/05/2021 NONE DETECTED  NONE DETECTED Final   Barbiturates 10/05/2021 NONE DETECTED  NONE DETECTED Final   Comment: (NOTE) DRUG SCREEN FOR MEDICAL PURPOSES ONLY.  IF CONFIRMATION IS NEEDED FOR ANY PURPOSE, NOTIFY LAB WITHIN 5 DAYS.  LOWEST DETECTABLE LIMITS FOR URINE DRUG SCREEN Drug Class                     Cutoff (ng/mL) Amphetamine and metabolites    1000 Barbiturate and metabolites    200 Benzodiazepine                 [id number] Tricyclics and metabolites     300 Opiates and metabolites        300 Cocaine and metabolites        300 THC                            50 Performed at [facility], [place] [address] [place], [place] [address]    Lipase 10/05/2021 29  11 - 51 U/L Final   Performed at [facility], [place] [address] [place], [place] [address]    Allergies: Patient has no known allergies.  PTA Medications: (Not in a hospital admission)   Medical Decision Making  Patient reviewed with Dr. Serafina Mitchell.  She remains voluntary at this time.  She will be placed in observation area at [facility] behavioral health while awaiting inpatient psychiatric hospitalization.  Laboratory studies ordered including CBC, CMP, ethanol, A1c, hepatic function, lipid panel, magnesium and TSH.  Urine pregnancy and urine drug screen ordered.  EKG order initiated.  Current medications: -Acetaminophen 650 mg every 6 as needed/mild pain -Maalox 30 mL oral every 4 as needed/digestion -Hydroxyzine 25 mg 3 times daily as needed/anxiety -Magnesium hydroxide 30 mL daily as needed/mild constipation -Trazodone 50 mg nightly as needed/sleep  We will restart home medications including risperidone and benztropine.  We will restart risperidone at starting dose as patient has not taken this medication for at least 2 months. -Risperidone M-Tab 1 mg oral twice daily/mood -Benztropine 0.5 mg twice daily/tremors or EPS  Agitation protocol initiated including: -Risperidone 2 mg every 8 hours as needed/agitation -Lorazepam 1 mg oral as needed/severe agitation for 1 dose -Ziprasidone 20 mg IM as needed agitation for 1 dose    Recommendations  Based on my evaluation the patient does not appear to have an emergency medical condition.  Lucky Rathke, FNP 11/21/21  3:29 PM

## 2021-11-21 NOTE — Progress Notes (Signed)
Patient presents to the [facility] with her mother with complaints of anxiety and depression.  Patient states that she is hearing voices, but unable to identify what they are saying.  Patient states that she got really anxious today and felt like people were out to get her.  Patient has a previous history of psychosis and states that he has no OP provider and she is not currently on medications.  Patient states that she is depressed, but denies SI/HI.  Patient appears to be cognitively delayed.  Patient does not appear to nbe a danger to herself or others.  Denies any SA use.  Patient appears to be routine and in need of medication management.

## 2021-11-22 DIAGNOSIS — F200 Paranoid schizophrenia: Secondary | ICD-10-CM | POA: Diagnosis not present

## 2021-11-22 DIAGNOSIS — Z91128 Patient's intentional underdosing of medication regimen for other reason: Secondary | ICD-10-CM | POA: Diagnosis not present

## 2021-11-22 DIAGNOSIS — Z20822 Contact with and (suspected) exposure to covid-19: Secondary | ICD-10-CM | POA: Diagnosis not present

## 2021-11-22 DIAGNOSIS — F209 Schizophrenia, unspecified: Secondary | ICD-10-CM

## 2021-11-22 DIAGNOSIS — T50916A Underdosing of multiple unspecified drugs, medicaments and biological substances, initial encounter: Secondary | ICD-10-CM | POA: Diagnosis not present

## 2021-11-22 MED ORDER — HYDROXYZINE HCL 25 MG PO TABS: 25.0000 mg | ORAL_TABLET | Freq: Three times a day (TID) | ORAL | 0 refills | Status: AC | PRN

## 2021-11-22 MED ORDER — RISPERIDONE 1 MG PO TABS
1.0000 mg | ORAL_TABLET | Freq: Two times a day (BID) | ORAL | Status: DC
  Filled 2021-11-22: qty 14

## 2021-11-22 MED ORDER — TRAZODONE HCL 50 MG PO TABS: 50.0000 mg | ORAL_TABLET | Freq: Every evening | ORAL | 30 refills | Status: DC | PRN

## 2021-11-22 MED ORDER — BENZTROPINE MESYLATE 0.5 MG PO TABS: 0.5000 mg | ORAL_TABLET | Freq: Two times a day (BID) | ORAL | 0 refills | Status: AC | PRN

## 2021-11-22 MED ORDER — RISPERIDONE 1 MG PO TABS: 1.0000 mg | ORAL_TABLET | Freq: Two times a day (BID) | ORAL | 0 refills | Status: DC

## 2021-11-22 NOTE — ED Provider Notes (Addendum)
FBC/OBS ASAP Discharge Summary  Date and Time: 11/22/2021 1:44 PM  Name: Kathy Brown  MRN:  [id number]   Discharge Diagnoses:  Final diagnoses:  Schizophrenia, unspecified type ([place])    Subjective:  Patient seen and chart reviewed-she has been medication compliant has not been an issue on the unit.  Per documentation patient slept well overnight.  On interview, patient is somewhat childlike but is calm, pleasant and appropriate.  Patient denied SI/HI/AVH.  She states that she slept well and is requesting to go home.  She denies all physical complaints.  She denies SE/AE from medications.  Patient provides consent for her mother to be contacted.  Encouraged patient to call her mother as well to inform her of how she is feeling- patient was agreeable and called her mother with the assistance of staff.   Mother-[name] Mare Ferrari [phone] Called at 12:23 pm No immediate safety concerns upon discharge.  Informed mother about patient's progress while admitted to the observation unit.  Mother states that she can come pick her up this afternoon and will be here for approximately 2 PM s  Stay Summary:  41 year old female with history of schizophrenia who presented voluntarily to [facility] behavioral health for walk-in assessment on 1/26 for psychotic symptoms..  Patient is accompanied by her mother, Rica Mast, who remains present during assessment per patient preference.  Aashrita states "I am scared, I have nightmares." She is a limited historian at this time.  Patient shares that recent stressors include ongoing nightmares and difficulty sleeping.  She also reports auditory hallucinations, "I hear a lot of voices." No command hallucinations reported She presents with tangential conversation.  States "my bed is too hard and my room is so cold, my mouth hurts all the time." Patient is assessed face-to-face by nurse practitioner.  She is seated in assessment area, no acute distress.  She is alert  and oriented, pleasant and cooperative during assessment.  Bonniejean has been diagnosed with paranoid schizophrenia and schizoaffective disorder. She taking her medication approximately 2 months ago.  She is not linked with outpatient psychiatry at this time.  1 year ago she stopped going to her day program that also assisted with medication.  For several months she voluntarily took her medication when offered by her mother.  2 months ago she began refusing all medications.  She does have history of inpatient psychiatric hospitalizations.  Most recent psychiatric hospitalization March 2022 at [facility] behavioral health. She presents with anxious mood, congruent affect.  She denies suicidal and homicidal ideations.   She has appropriate behavior.  No visual hallucinations reported.    Patient endorses decreased sleep and appetite.  Sleeping very little related to ongoing nightmares.  Patient resides in [place] with her mother, her grandmother and her 3 children.  Denies access to weapons.  She is currently not employed.  She denies alcohol or substance use.  Provider spoke to patient's mother, Dannielle Karvonen [phone], who shares that patient has not eaten, as she typically would, over the last 2 months.  She has observed patient, alone in her room, "hollering screaming at her self."  Patient's mother shares that patient had her remaining teeth removed 1 year ago.  Since that time she has lost her dentures.  Patient's mother reports patient chronically complains of gum playing related to chewing food with only her gums.  Patient was admitted to the behavior health urgent care continuous observation unit and restart Risperdal 1 mg twice daily and 0.5 mg Cogentin twice daily as needed.  Patient tolerated medication well.  Patient slept well overnight per documentation and her behavior was appropriate.  The following day, day of discharge, patient denied SI/HI/AVH and she requested  discharge.   On my  interview, patient is in NAD, alert, oriented, calm, cooperative, and attentive, with normal affect, speech, and behavior. Objectively, there is no evidence of psychosis/ mania (able to converse coherently, linear and goal directed thought, no RIS, no distractibility, not pre-occupied, no FOI, etc) nor depression to the point of suicidality (able to concentrate, affect full and reactive, speech normal r/v/t, no psychomotor retardation/agitation, etc) Overall, patient appears to be at the point, in the absence of inhibiting or disinhibiting symptoms, where she can successfully move to lesser restrictive setting for care.Patient did not meet criteria for IVC and patient's mother was contacted to inform of discharge and provide update.  See above for additional details.  Mother was agreeable for patient to return home.  Patient was discharged with 7-day samples of medications and 30-day prescription with no refills.  Social work was consulted and assisted by placing follow-up information in AVS.  Total Time spent with patient: 15 minutes  Past Psychiatric History: schizophrena Past Medical History:  Past Medical History:  Diagnosis Date   A-fib ([place])    Anxiety    Atrial fibrillation with RVR ([place]) 10/15/2017   Auditory hallucinations    Chronic back pain    Dysrhythmia    Atrial fibrillation   Paranoid schizophrenia ([place])    Psychosis ([place]) 03/25/2014   Schizoaffective disorder ([place]) 03/25/2014   Schizophrenia ([place])    Urinary incontinence     Past Surgical History:  Procedure Laterality Date   TOOTH EXTRACTION Bilateral 09/08/2018   Procedure: DENTAL RESTORATION/EXTRACTIONS;  Surgeon: Diona Browner, DDS;  Location: [facility];  Service: Oral Surgery;  Laterality: Bilateral;   Family History:  Family History  Problem Relation Age of Onset   Hypertension Mother    Diabetes Mother    Family Psychiatric History: unknown Social History:  Social History   Substance and  Sexual Activity  Alcohol Use No     Social History   Substance and Sexual Activity  Drug Use No    Social History   Socioeconomic History   Marital status: Single    Spouse name: Not on file   Number of children: Not on file   Years of education: Not on file   Highest education level: Not on file  Occupational History   Not on file  Tobacco Use   Smoking status: Never   Smokeless tobacco: Never  Vaping Use   Vaping Use: Never used  Substance and Sexual Activity   Alcohol use: No   Drug use: No   Sexual activity: Not Currently    Birth control/protection: None  Other Topics Concern   Not on file  Social History Narrative   Lives mom, dad, grandmother, and three kids   Social Determinants of Health   Financial Resource Strain: Not on file  Food Insecurity: Not on file  Transportation Needs: Not on file  Physical Activity: Not on file  Stress: Not on file  Social Connections: Not on file   SDOH:  SDOH Screenings   Alcohol Screen: Low Risk    Last Alcohol Screening Score (AUDIT): 1  Depression (PHQ2-9): Not on file  Financial Resource Strain: Not on file  Food Insecurity: Not on file  Housing: Not on file  Physical Activity: Not on file  Social Connections: Not on file  Stress: Not on file  Tobacco Use: Low Risk    Smoking Tobacco Use: Never   Smokeless Tobacco Use: Never   Passive Exposure: Not on file  Transportation Needs: Not on file    Tobacco Cessation:  Prescription not provided because: n/a  Current Medications:  Current Facility-Administered Medications  Medication Dose Route Frequency Provider Last Rate Last Admin   acetaminophen (TYLENOL) tablet 650 mg  650 mg Oral Q6H PRN Lucky Rathke, FNP       alum & mag hydroxide-simeth (MAALOX/MYLANTA) 200-200-20 MG/5ML suspension 30 mL  30 mL Oral Q4H PRN Lucky Rathke, FNP       benztropine (COGENTIN) tablet 0.5 mg  0.5 mg Oral BID PRN Lucky Rathke, FNP       hydrOXYzine (ATARAX) tablet 25 mg  25 mg  Oral TID PRN Lucky Rathke, FNP       risperiDONE (RISPERDAL M-TABS) disintegrating tablet 2 mg  2 mg Oral Q8H PRN Lucky Rathke, FNP       And   LORazepam (ATIVAN) tablet 1 mg  1 mg Oral PRN Lucky Rathke, FNP       And   ziprasidone (GEODON) injection 20 mg  20 mg Intramuscular PRN Lucky Rathke, FNP       magnesium hydroxide (MILK OF MAGNESIA) suspension 30 mL  30 mL Oral Daily PRN Lucky Rathke, FNP       risperiDONE (RISPERDAL M-TABS) disintegrating tablet 1 mg  1 mg Oral BID Lucky Rathke, FNP   1 mg at 11/22/21 0916   traZODone (DESYREL) tablet 50 mg  50 mg Oral QHS PRN,MR X 1 Lucky Rathke, FNP       Current Outpatient Medications  Medication Sig Dispense Refill   risperiDONE (RISPERDAL) 1 MG tablet Take 1 tablet (1 mg total) by mouth 2 (two) times daily. 60 tablet 0   benztropine (COGENTIN) 0.5 MG tablet Take 1 tablet (0.5 mg total) by mouth 2 (two) times daily as needed for tremors (Extraparametal side effects). 60 tablet 0   diltiazem (CARDIZEM CD) 120 MG 24 hr capsule Take 1 capsule (120 mg total) by mouth daily. (Patient taking differently: Take 120 mg by mouth daily. LF 02/11/21) 30 capsule 0   hydrOXYzine (ATARAX) 25 MG tablet Take 1 tablet (25 mg total) by mouth 3 (three) times daily as needed for anxiety. 30 tablet 0   traZODone (DESYREL) 50 MG tablet Take 1 tablet (50 mg total) by mouth at bedtime as needed for sleep. 30 tablet 30    PTA Medications: (Not in a hospital admission)   Musculoskeletal  Strength & Muscle Tone: within normal limits Gait & Station: normal Patient leans: N/A  Psychiatric Specialty Exam  Presentation  General Appearance: Appropriate for Environment; Casual  Eye Contact:Good  Speech:Clear and Coherent; Normal Rate  Speech Volume:Normal  Handedness:Right   Mood and Affect  Mood:Euthymic  Affect:Appropriate; Congruent   Thought Process  Thought Processes:Coherent; Goal Directed; Linear  Descriptions of  Associations:Intact  Orientation:Full (Time, Place and Person)  Thought Content:Logical  Diagnosis of Schizophrenia or Schizoaffective disorder in past: Yes  Duration of Psychotic Symptoms: Greater than six months   Hallucinations:Hallucinations: None Description of Auditory Hallucinations: "I hear a lot of voices"  Ideas of Reference:None  Suicidal Thoughts:Suicidal Thoughts: No  Homicidal Thoughts:Homicidal Thoughts: No   Sensorium  Memory:Immediate Good; Recent Fair; Remote Fair  Judgment:Fair  Insight:Fair   Executive Functions  Concentration:Fair  Attention Span:Fair  Recall:Fair  Fund of Knowledge:Fair  Language:Good   Psychomotor Activity  Psychomotor Activity:Psychomotor Activity: Normal   Assets  Assets:Communication Skills; Desire for Improvement; Housing; Social Support; Resilience   Sleep  Sleep:Sleep: Good   Nutritional Assessment (For OBS and FBC admissions only) Has the patient had a weight loss or gain of 10 pounds or more in the last 3 months?: No Has the patient had a decrease in food intake/or appetite?: Yes Does the patient have dental problems?: Yes Does the patient have eating habits or behaviors that may be indicators of an eating disorder including binging or inducing vomiting?: No Has the patient recently lost weight without trying?: 2.0 Has the patient been eating poorly because of a decreased appetite?: 0 Malnutrition Screening Tool Score: 2 Nutritional Assessment Referrals: Medication/Tx changes    Physical Exam  Physical Exam Constitutional:      Appearance: Normal appearance. She is normal weight.  HENT:     Head: Normocephalic and atraumatic.  Pulmonary:     Effort: Pulmonary effort is normal.  Neurological:     General: No focal deficit present.     Mental Status: She is alert and oriented to person, place, and time.   Review of Systems  Constitutional:  Negative for chills and fever.  HENT:  Negative for  hearing loss.   Eyes:  Negative for discharge and redness.  Respiratory:  Negative for cough.   Cardiovascular:  Negative for chest pain.  Gastrointestinal:  Negative for abdominal pain.  Musculoskeletal:  Negative for myalgias.  Neurological:  Negative for headaches.  Psychiatric/Behavioral:  Negative for depression, hallucinations, substance abuse and suicidal ideas.   Blood pressure 122/90, pulse 62, temperature (!) 97.4 F (36.3 C), temperature source Oral, resp. rate 16, SpO2 98 %. There is no height or weight on file to calculate BMI.  Demographic Factors:  Low socioeconomic status and Unemployed  Loss Factors: Financial problems/change in socioeconomic status  Historical Factors: Impulsivity  Risk Reduction Factors:   Responsible for children under 5 years of age, Sense of responsibility to family, Living with another person, especially a relative, and Positive social support  Continued Clinical Symptoms:  Schizophrenia:   Less than 38 years old Paranoid or undifferentiated type Previous Psychiatric Diagnoses and Treatments Medical Diagnoses and Treatments/Surgeries  Cognitive Features That Contribute To Risk:  Limited problem solving skills  Suicide Risk:  Mild:  Suicidal ideation of limited frequency, intensity, duration, and specificity.  There are no identifiable plans, no associated intent, mild dysphoria and related symptoms, good self-control (both objective and subjective assessment), few other risk factors, and identifiable protective factors, including available and accessible social support.  Plan Of Care/Follow-up recommendations:  Activity:  as tolerated Other:  take medications as prescribed.  Keep appointments with psychiatrist, therapist and ACT team.  Do not drink alcohol or use substances of abuse.  See primary care provider and cardiologist.  Follow up with Dental provider regarding getting dentures  Allergies as of 11/22/2021   No Known Allergies       Medication List     STOP taking these medications    ondansetron 4 MG tablet Commonly known as: ZOFRAN   risperiDONE 3 MG disintegrating tablet Commonly known as: RISPERDAL M-TABS Replaced by: risperiDONE 1 MG tablet       TAKE these medications    benztropine 0.5 MG tablet Commonly known as: COGENTIN Take 1 tablet (0.5 mg total) by mouth 2 (two) times daily as needed for tremors (Extraparametal side effects).   diltiazem 120 MG  24 hr capsule Commonly known as: CARDIZEM CD Take 1 capsule (120 mg total) by mouth daily. What changed: additional instructions   hydrOXYzine 25 MG tablet Commonly known as: ATARAX Take 1 tablet (25 mg total) by mouth 3 (three) times daily as needed for anxiety. What changed:  medication strength how much to take   risperiDONE 1 MG tablet Commonly known as: RisperDAL Take 1 tablet (1 mg total) by mouth 2 (two) times daily. Replaces: risperiDONE 3 MG disintegrating tablet   traZODone 50 MG tablet Commonly known as: DESYREL Take 1 tablet (50 mg total) by mouth at bedtime as needed for sleep. What changed:  when to take this reasons to take this        Patient was provided with 7 day samples of hydroxyzine, risperdal, trazodone, cogentin as well as paper prescriptions for 30 days with one refill at time of discharge.  Patient was provided with follow up information regarding psychiatric outpatient resources in AVS with the assistance of SW prior to discharge.      Disposition: home with mother  Ival Bible, MD 11/22/2021, 1:44 PM

## 2021-11-22 NOTE — ED Notes (Signed)
Appears to be resting quietly at this time , respirations even and unlabored , no distress noted

## 2021-11-22 NOTE — ED Notes (Signed)
Slept well no night tares noted waken easily for vital signs WNL.

## 2021-11-22 NOTE — Progress Notes (Signed)
Patient currently resting with even and unlabored respirations at this time. Staff will continue to monitor.

## 2021-11-22 NOTE — ED Notes (Signed)
Patient received After visit summary and sample medications. Patient received community resources to follow up with. Patient's mother drove her home in private vehicle.

## 2021-11-22 NOTE — Progress Notes (Signed)
Patient denies SI, HI and AVH. Patient is calm and cooperative. Patient takes medication appropriately. Demeanor is childlike but pleasant.  Nursing staff will continue to monitor.

## 2021-11-22 NOTE — Discharge Instructions (Addendum)
Take all medications as prescribed by his/her mental healthcare provider. °Report any adverse effects and or reactions from the medicines to your outpatient provider promptly. °Do not engage in alcohol and or illegal drug use while on prescription medicines. °In the event of worsening symptoms, call the crisis hotline, 911 and or go to the nearest ED for appropriate evaluation and treatment of symptoms. °follow-up with your primary care provider for your other medical issues, concerns and or health care needs. ° ° ° ° °Please come to [facility] (this facility) during walk in hours for appointment with psychiatrist for further medication management and for therapists for therapy.  ° ° Walk in hours are 8-11 AM Monday through Thursday for medication management. Therapy walk in hours are Monday-Wednesday 8 AM-1PM.   It is first come, first -serve; it is best to arrive by 7:00 AM.  ° °On Friday from 1 pm to 4 pm for therapy intake only. Please arrive by 12:00 pm as it is  first come, first -serve.   ° °When you arrive please go upstairs for your appointment. If you are unsure of where to go, inform the front desk that you are here for a walk in appointment and they will assist you with directions upstairs. ° °Address:  °[address], [place] , [address] °Ph: [phone]  ° °

## 2021-12-10 ENCOUNTER — Telehealth (HOSPITAL_COMMUNITY): Payer: Self-pay | Admitting: Emergency Medicine

## 2021-12-10 NOTE — BH Assessment (Signed)
Care Management - [facility] Follow Up Discharges  ° °Writer attempted to make contact with patient today and was unsuccessful.  Writer left a HIPPA compliant voice message.  ° °Per chart review, patient was provided with outpatient resources. ° °

## 2022-01-23 ENCOUNTER — Inpatient Hospital Stay (HOSPITAL_COMMUNITY)
Admission: EM | Admit: 2022-01-23 | Discharge: 2022-01-31 | DRG: 640 | Disposition: A | Discharge status: Home / Self Care | Payer: Medicare Other | Source: Ambulatory Visit | Attending: Internal Medicine | Admitting: Internal Medicine

## 2022-01-23 ENCOUNTER — Other Ambulatory Visit: Payer: Self-pay

## 2022-01-23 ENCOUNTER — Encounter (HOSPITAL_COMMUNITY): Payer: Self-pay | Admitting: Emergency Medicine

## 2022-01-23 ENCOUNTER — Ambulatory Visit (INDEPENDENT_AMBULATORY_CARE_PROVIDER_SITE_OTHER)
Admission: EM | Admit: 2022-01-23 | Discharge: 2022-01-23 | Disposition: A | Discharge status: Other Acute Inpatient Hospital | Payer: Medicare Other | Source: Home / Self Care

## 2022-01-23 DIAGNOSIS — E871 Hypo-osmolality and hyponatremia: Secondary | ICD-10-CM | POA: Diagnosis present

## 2022-01-23 DIAGNOSIS — I429 Cardiomyopathy, unspecified: Secondary | ICD-10-CM | POA: Diagnosis not present

## 2022-01-23 DIAGNOSIS — I4821 Permanent atrial fibrillation: Secondary | ICD-10-CM | POA: Diagnosis present

## 2022-01-23 DIAGNOSIS — Y92230 Patient room in hospital as the place of occurrence of the external cause: Secondary | ICD-10-CM | POA: Diagnosis not present

## 2022-01-23 DIAGNOSIS — I952 Hypotension due to drugs: Secondary | ICD-10-CM | POA: Diagnosis not present

## 2022-01-23 DIAGNOSIS — F29 Unspecified psychosis not due to a substance or known physiological condition: Secondary | ICD-10-CM | POA: Diagnosis not present

## 2022-01-23 DIAGNOSIS — R627 Adult failure to thrive: Secondary | ICD-10-CM | POA: Diagnosis present

## 2022-01-23 DIAGNOSIS — K5900 Constipation, unspecified: Secondary | ICD-10-CM | POA: Diagnosis not present

## 2022-01-23 DIAGNOSIS — I110 Hypertensive heart disease with heart failure: Secondary | ICD-10-CM | POA: Diagnosis present

## 2022-01-23 DIAGNOSIS — Z7982 Long term (current) use of aspirin: Secondary | ICD-10-CM | POA: Diagnosis not present

## 2022-01-23 DIAGNOSIS — T461X5A Adverse effect of calcium-channel blockers, initial encounter: Secondary | ICD-10-CM | POA: Diagnosis not present

## 2022-01-23 DIAGNOSIS — G9341 Metabolic encephalopathy: Secondary | ICD-10-CM | POA: Diagnosis present

## 2022-01-23 DIAGNOSIS — E861 Hypovolemia: Secondary | ICD-10-CM | POA: Diagnosis present

## 2022-01-23 DIAGNOSIS — T43506A Underdosing of unspecified antipsychotics and neuroleptics, initial encounter: Secondary | ICD-10-CM | POA: Diagnosis present

## 2022-01-23 DIAGNOSIS — N179 Acute kidney failure, unspecified: Secondary | ICD-10-CM | POA: Diagnosis not present

## 2022-01-23 DIAGNOSIS — I482 Chronic atrial fibrillation, unspecified: Secondary | ICD-10-CM | POA: Diagnosis not present

## 2022-01-23 DIAGNOSIS — R2681 Unsteadiness on feet: Secondary | ICD-10-CM | POA: Diagnosis not present

## 2022-01-23 DIAGNOSIS — E870 Hyperosmolality and hypernatremia: Principal | ICD-10-CM | POA: Diagnosis present

## 2022-01-23 DIAGNOSIS — Z046 Encounter for general psychiatric examination, requested by authority: Secondary | ICD-10-CM | POA: Diagnosis not present

## 2022-01-23 DIAGNOSIS — F200 Paranoid schizophrenia: Secondary | ICD-10-CM | POA: Insufficient documentation

## 2022-01-23 DIAGNOSIS — Z6829 Body mass index (BMI) 29.0-29.9, adult: Secondary | ICD-10-CM | POA: Diagnosis not present

## 2022-01-23 DIAGNOSIS — R109 Unspecified abdominal pain: Secondary | ICD-10-CM | POA: Diagnosis not present

## 2022-01-23 DIAGNOSIS — M549 Dorsalgia, unspecified: Secondary | ICD-10-CM | POA: Diagnosis present

## 2022-01-23 DIAGNOSIS — G8929 Other chronic pain: Secondary | ICD-10-CM | POA: Diagnosis present

## 2022-01-23 DIAGNOSIS — R456 Violent behavior: Secondary | ICD-10-CM | POA: Diagnosis not present

## 2022-01-23 DIAGNOSIS — E860 Dehydration: Secondary | ICD-10-CM | POA: Diagnosis not present

## 2022-01-23 DIAGNOSIS — R531 Weakness: Secondary | ICD-10-CM | POA: Diagnosis not present

## 2022-01-23 DIAGNOSIS — I4891 Unspecified atrial fibrillation: Secondary | ICD-10-CM | POA: Diagnosis not present

## 2022-01-23 DIAGNOSIS — F419 Anxiety disorder, unspecified: Secondary | ICD-10-CM | POA: Diagnosis present

## 2022-01-23 DIAGNOSIS — I5022 Chronic systolic (congestive) heart failure: Secondary | ICD-10-CM | POA: Diagnosis not present

## 2022-01-23 DIAGNOSIS — F79 Unspecified intellectual disabilities: Secondary | ICD-10-CM | POA: Diagnosis present

## 2022-01-23 DIAGNOSIS — Z8249 Family history of ischemic heart disease and other diseases of the circulatory system: Secondary | ICD-10-CM

## 2022-01-23 DIAGNOSIS — E785 Hyperlipidemia, unspecified: Secondary | ICD-10-CM | POA: Diagnosis present

## 2022-01-23 DIAGNOSIS — R262 Difficulty in walking, not elsewhere classified: Secondary | ICD-10-CM | POA: Diagnosis not present

## 2022-01-23 DIAGNOSIS — Z91148 Patient's other noncompliance with medication regimen for other reason: Secondary | ICD-10-CM

## 2022-01-23 DIAGNOSIS — Z79899 Other long term (current) drug therapy: Secondary | ICD-10-CM | POA: Diagnosis not present

## 2022-01-23 LAB — CBC
HCT: 48 % — ABNORMAL HIGH (ref 36.0–46.0)
Hemoglobin: 14.7 g/dL (ref 12.0–15.0)
MCH: 27.7 pg (ref 26.0–34.0)
MCHC: 30.6 g/dL (ref 30.0–36.0)
MCV: 90.6 fL (ref 80.0–100.0)
Platelets: 227 10*3/uL (ref 150–400)
RBC: 5.3 MIL/uL — ABNORMAL HIGH (ref 3.87–5.11)
RDW: 17.9 % — ABNORMAL HIGH (ref 11.5–15.5)
WBC: 6.5 10*3/uL (ref 4.0–10.5)
nRBC: 0 % (ref 0.0–0.2)

## 2022-01-23 LAB — COMPREHENSIVE METABOLIC PANEL
ALT: 17 U/L (ref 0–44)
AST: 26 U/L (ref 15–41)
Albumin: 4.9 g/dL (ref 3.5–5.0)
Alkaline Phosphatase: 34 U/L — ABNORMAL LOW (ref 38–126)
Anion gap: 10 (ref 5–15)
BUN: 46 mg/dL — ABNORMAL HIGH (ref 6–20)
CO2: 26 mmol/L (ref 22–32)
Calcium: 10.9 mg/dL — ABNORMAL HIGH (ref 8.9–10.3)
Chloride: 119 mmol/L — ABNORMAL HIGH (ref 98–111)
Creatinine, Ser: 1.62 mg/dL — ABNORMAL HIGH (ref 0.44–1.00)
GFR, Estimated: 41 mL/min — ABNORMAL LOW (ref >60)
Glucose, Bld: 121 mg/dL — ABNORMAL HIGH (ref 70–99)
Potassium: 3.9 mmol/L (ref 3.5–5.1)
Sodium: 155 mmol/L — ABNORMAL HIGH (ref 135–145)
Total Bilirubin: 1.3 mg/dL — ABNORMAL HIGH (ref 0.3–1.2)
Total Protein: 8.7 g/dL — ABNORMAL HIGH (ref 6.5–8.1)

## 2022-01-23 LAB — ACETAMINOPHEN LEVEL: Acetaminophen (Tylenol), Serum: <10 ug/mL — ABNORMAL LOW (ref 10–30)

## 2022-01-23 LAB — I-STAT BETA HCG BLOOD, ED (MC, WL, AP ONLY): I-stat hCG, quantitative: <5 m[IU]/mL (ref <5)

## 2022-01-23 LAB — ETHANOL: Alcohol, Ethyl (B): <10 mg/dL (ref <10)

## 2022-01-23 LAB — SALICYLATE LEVEL: Salicylate Lvl: <7 mg/dL — ABNORMAL LOW (ref 7.0–30.0)

## 2022-01-23 MED ORDER — RISPERIDONE 1 MG PO TABS: 1.0000 mg | ORAL_TABLET | Freq: Two times a day (BID) | ORAL | Status: DC

## 2022-01-23 MED ORDER — MAGNESIUM SULFATE 2 GM/50ML IV SOLN
2.0000 g | Freq: Once | INTRAVENOUS | Status: AC
Start: 2022-01-23 — End: 2022-01-24
  Administered 2022-01-24: 2 g via INTRAVENOUS
  Filled 2022-01-23: qty 50

## 2022-01-23 MED ORDER — DILTIAZEM HCL-DEXTROSE 125-5 MG/125ML-% IV SOLN (PREMIX)
5.0000 mg/h | INTRAVENOUS | Status: DC
  Administered 2022-01-24: 5 mg/h via INTRAVENOUS
  Filled 2022-01-23: qty 125

## 2022-01-23 MED ORDER — BENZTROPINE MESYLATE 0.5 MG PO TABS
0.5000 mg | ORAL_TABLET | Freq: Two times a day (BID) | ORAL | Status: DC | PRN
  Filled 2022-01-23: qty 1

## 2022-01-23 MED ORDER — HYDROXYZINE HCL 25 MG PO TABS
25.0000 mg | ORAL_TABLET | Freq: Three times a day (TID) | ORAL | Status: DC | PRN
  Administered 2022-01-24 – 2022-01-30 (×8): 25 mg via ORAL
  Filled 2022-01-23 (×9): qty 1

## 2022-01-23 MED ORDER — TRAZODONE HCL 50 MG PO TABS
50.0000 mg | ORAL_TABLET | Freq: Every evening | ORAL | Status: DC | PRN
  Administered 2022-01-24 – 2022-01-30 (×7): 50 mg via ORAL
  Filled 2022-01-23 (×7): qty 1

## 2022-01-23 MED ORDER — DEXTROSE 5 % IV SOLN
INTRAVENOUS | Status: DC
  Administered 2022-01-23: 150 mL/h via INTRAVENOUS

## 2022-01-23 MED ORDER — DILTIAZEM HCL ER COATED BEADS 120 MG PO CP24: 120.0000 mg | ORAL_CAPSULE | Freq: Every day | ORAL | Status: DC

## 2022-01-23 NOTE — Progress Notes (Signed)
Attempted to call pt's mother tamary frazier at [phone] with update on pt's condition. No answer x 2 attempts. ?

## 2022-01-23 NOTE — ED Provider Notes (Addendum)
Behavioral Health Urgent Care Medical Screening Exam ? ?Patient Name: Kathy Brown ?MRN: [id number] ?Date of Evaluation: 01/23/22  ?Diagnosis:  ?Final diagnoses:  ?Schizophrenia, paranoid ([facility])  ? ? ?History of Present illness: Kathy Brown is a 41 y.o. female patient who presents to the [facility] behavioral health urgent care voluntary as a walk-in accompanied by [name] with a chief complaint of patient has been aggressive towards her parents who she lives with and has not been eating or taking her medications. Patient has a past psychiatric hx significant of paranoid schizophrenia.  ? ?On evaluation, patient is alert and oriented to person and place. She is disoriented to time and situation. She is noted to be swaying from side-to-side and leaning backwards. She had to be escorted to the consult room using a two person escort.  ? ?Patient states, "I need help with medications." She repeatedly states, "I am scared." Patient's thought process is disorganized and scattered with paranoia thought content. She is a poor historian and is unable to answer questions appropriate and provides irrelevant information that does not pertain to the assessment questions.  ? ?Patient denies SI/HI. She endorses AH, "to hurt people." She does not endorse VH. She presents paranoid and states "I am scared" throughout the assessment. She is noted to have episodes of crying and being fearful.  ? ?I spoke to the patient's sister Kathy Brown and patient's mother Kathy Brown [phone] face to face here at the [facility] to obtain collateral information. Chonteza states that the patient stopped eating, drinking, and bathing 2 weeks ago.  She states that the patient has been exhibiting outbursts that she describes as the patient crying, screaming, and talking to herself as if multiple people are present almost every night between 2 AM to 6 AM. She states that the patient has been peeing on herself because she is afraid to go  to the bathroom for the past 2 weeks. She states that she does not believe that the patient has had a bowel movement in 2 weeks. She states that the patient loss about 100 pounds within the past couple months and 15 pounds in the past 2 weeks. Ms. Bascom Levels states that the patient was here in January and did good for 2 weeks after she was discharged. She states that the patient stopped taking her medications. Patient resides with her mother Kathy Brown and her three children.  ? ?Psychiatric Specialty Exam ? ?Presentation  ?General Appearance:Bizarre; Disheveled ? ?Eye Contact:Fleeting ? ?Speech:Garbled ? ?Speech Volume:Normal ? ?Handedness:Right ? ? ?Mood and Affect  ?Mood:Labile ? ?Affect:Labile ? ? ?Thought Process  ?Thought Processes:Disorganized; Irrevelant ? ?Descriptions of Associations:Loose ? ?Orientation:Full (Time, Place and Person) ? ?Thought Content:Illogical; Rumination; Paranoid Ideation ? Diagnosis of Schizophrenia or Schizoaffective disorder in past: Yes ? Duration of Psychotic Symptoms: Greater than six months ? Hallucinations:Auditory ?"I hear a lot of voices" ? ?Ideas of Reference:Paranoia ? ?Suicidal Thoughts:No ? ?Homicidal Thoughts:No ? ? ?Sensorium  ?Memory:Immediate Fair ? ?Judgment:Impaired ? ?Insight:Poor ? ? ?Executive Functions  ?Concentration:Poor ? ?Attention Span:Fair ? ?Recall:Poor ? ?Fund of Knowledge:Poor ? ?Language:Fair ? ? ?Psychomotor Activity  ?Psychomotor Activity:Restlessness ? ? ?Assets  ?Assets:Housing; Social Support ? ? ?Sleep  ?Sleep:Poor ? ?Number of hours: No data recorded ? ?No data recorded ? ?Physical Exam: ?Physical Exam ?HENT:  ?   Head: Normocephalic.  ?   Nose: Nose normal.  ?   Mouth/Throat:  ?   Mouth: Mucous membranes are dry.  ?Eyes:  ?   Conjunctiva/sclera: Conjunctivae normal.  ?  Cardiovascular:  ?   Rate and Rhythm: Normal rate.  ?Pulmonary:  ?   Effort: Pulmonary effort is normal.  ?Neurological:  ?   Mental Status: She is alert and oriented to person,  place, and time.  ? ?Review of Systems  ?Constitutional:  Positive for malaise/fatigue.  ?HENT: Negative.    ?Eyes: Negative.   ?Respiratory: Negative.    ?Cardiovascular: Negative.   ?Gastrointestinal: Negative.   ?Genitourinary: Negative.   ?Neurological: Negative.   ?Endo/Heme/Allergies: Negative.   ?Blood pressure 91/67, pulse 82, resp. rate 18, SpO2 100 %. There is no height or weight on file to calculate BMI. ? ?Musculoskeletal: ?Strength & Muscle Tone: decreased ?Gait & Station: unsteady ?Patient leans: N/A ? ? ?[facility] MSE Discharge Disposition for Follow up and Recommendations: ?Based on my evaluation the patient appears to have an emergency medical condition for which I recommend the patient be transferred to the emergency department for further evaluation.  Patient transferred to [facility] due to unsteady gait and medical clearance. Report given to Dr. Wilkie Aye. Patient to transfer via non emergent EMS. Patient is voluntary. Patient is recommended for inpatient psychiatric treatment once medically cleared. Please place TTS consult for ongoing psychiatry services.  ? ? ?Layla Barter, NP ?01/23/2022, 6:52 PM ? ?

## 2022-01-23 NOTE — Discharge Instructions (Addendum)
Transfer to [facility]  

## 2022-01-23 NOTE — BH Assessment (Signed)
Comprehensive Clinical Assessment (CCA) Note ? ?01/23/2022 ?Kathy Brown ?[id number] ? ?DISPOSITION: Per Liborio Nixon NP, pt is recommended for IP psychiatric treatment. ? ?The patient demonstrates the following risk factors for suicide: Chronic risk factors for suicide include: psychiatric disorder of Schizophrenia and possible IDD . Acute risk factors for suicide include: family or marital conflict and social withdrawal/isolation. Protective factors for this patient include: hope for the future. Considering these factors, the overall suicide risk at this point appears to be low. Patient is appropriate for outpatient follow up. ? ?Flowsheet Row ED from 11/21/2021 in [facility] ED from 10/05/2021 in [facility] Admission (Discharged) [facility]  ?C-SSRS RISK CATEGORY No Risk No Risk No Risk  ? ?  ? ?Per Triage assessment today: ?"Pt presents to [facility] voluntarily escorted by [name]. Per [name] pt has been aggressive towards her parents who she lives with and she has not been eating or taking her medications. Per [name] pt has been diagnosed with schizophrenia.Pt appears to be cognitively delayed, and pt has urinated on herself. Pt just repeats " I am scared, I am scared". Pt denies SI/HI and AVH." ? ?Upon further assessment: ?Pt presented voluntarily with [name] and displayed disorganized speech and thought and somewhat pressured speech which was garbled. It is unclear as to whether her speech was garbled due to missing teeth and pain in her mouth (she continually stated she was in pain) or if this is baseline for her. Pt displayed loose associations and tangential thought/speech. Pt seemed paranoid and fearful but could not explain anything about why she was afraid. Pt appeared as if she is cognitively impaired and she stated that she attended "special classes" when she was in school. Judgment, insight and  impulse control seem impaired.  ? ?Per [name], pt has been aggressive toward her family and not eating or taking her prescribed medications. Hx of schizophrenia. Pt denied SI, HI and VH. Pt stated that she often hears voices "in my ear" that tell her to hurt others. She volunteered that they were "a 10" possibly indicating their level of activity or intensity. She did not qualify her statement further. Pt was fixated on "wanting my teeth back," her mouth hurting (she continually pulled at her bottom lip), that she was scared and cold. She also seemed fixated somewhat on her grandmother who she stated was in a wheelchair but who she said gives her direction in ADLs such as bathing herself. Pt was assessed at [facility] on 11/21/21 with a similar presentation. Pt could not answer simple questions like who she lived with (if anyone), if she was taking her prescribed medications, if she was sleeping well or where she lived.  ? ? ?Chief Complaint:  ?Chief Complaint  ?Patient presents with  ? Altered Mental Status  ? Schizophrenia  ? ?Visit Diagnosis:  ?Schizophrenia  ? ? ?CCA Screening, Triage and Referral (STR) ? ?Patient Reported Information ?How did you hear about [place]? Family/Friend ? ?What Is the Reason for Your Visit/Call Today? Pt presents to [facility] voluntarily escorted by [name]. Per [name] pt has been aggressive towards her parents who she lives with and she has not been eating or taking her medications. Per [name] pt has been diagnosed with schizophrenia.Pt appears to be cognitively delayed, and pt has urinated on herself. Pt just repeats " I am scared, I am scared". Pt denies SI/HI and AVH. ? ?How Long Has This Been Causing You  Problems? <Week ? ?What Do You Feel Would Help You the Most Today? Treatment for Depression or other mood problem ? ? ?Have You Recently Had Any Thoughts About Hurting Yourself? No ? ?Are You Planning to Commit Suicide/Harm Yourself At This time? No ? ? ?Have you Recently Had Thoughts About Hurting Someone  [name]? No ? ?Are You Planning to Harm Someone at This Time? No ? ?Explanation: No data recorded ? ?Have You Used Any Alcohol or Drugs in the Past 24 Hours? No ? ?How Long Ago Did You Use Drugs or Alcohol? No data recorded ?What Did You Use and How Much? No data recorded ? ?Do You Currently Have a Therapist/Psychiatrist? -- Rich Reining) ? ?Name of Therapist/Psychiatrist: No data recorded ? ?Have You Been Recently Discharged From Any Office Practice or Programs? No data recorded ?Explanation of Discharge From Practice/Program: No data recorded ? ?  ?CCA Screening Triage Referral Assessment ?Type of Contact: Face-to-Face ? ?Telemedicine Service Delivery:   ?Is this Initial or Reassessment? No data recorded ?Date Telepsych consult ordered in [facility]:  No data recorded ?Time Telepsych consult ordered in CH[facility]:  No data recorded ?Location of Assessment: [facility] ? ?Provider Location: [facility] Assessment Services ? ? ?Collateral Involvement: NP to call pt's mom with pt's verbal permission for collateral information ? ? ?Does Patient Have a Automotive engineer Guardian? No data recorded ?Name and Contact of Legal Guardian: No data recorded ?If Minor and Not Living with Parent(s), Who has Custody? No data recorded ?Is CPS involved or ever been involved? -- Rich Reining) ? ?Is APS involved or ever been involved? -- Rich Reining) ? ? ?Patient Determined To Be At Risk for Harm To Self or Others Based on Review of Patient Reported Information or Presenting Complaint? No data recorded ?Method: No data recorded ?Availability of Means: No data recorded ?Intent: No data recorded ?Notification Required: No data recorded ?Additional Information for Danger to Others Potential: No data recorded ?Additional Comments for Danger to Others Potential: No data recorded ?Are There Guns or Other Weapons in Your Home? No data recorded ?Types of Guns/Weapons: No data recorded ?Are These Weapons Safely Secured?                            No data recorded ?Who  Could Verify You Are Able To Have These Secured: No data recorded ?Do You Have any Outstanding Charges, Pending Court Dates, Parole/Probation? No data recorded ?Contacted To Inform of Risk of Harm To Self or Others: No data recorded ? ? ?Does Patient Present under Involuntary Commitment? No ? ?IVC Papers Initial File Date: No data recorded ? ?[place] of Residence: [place] ? ? ?Patient Currently Receiving the Following Services: -- Rich Reining) ? ? ?Determination of Need: Emergent (2 hours) (Per Liborio Nixon NP, pt is recommended for IP psychiatric treatment.) ? ? ?Options For Referral: Inpatient Hospitalization ? ? ? ? ?CCA Biopsychosocial ?Patient Reported Schizophrenia/Schizoaffective Diagnosis in Past: Yes ? ? ?Strengths: UTA ? ? ?Mental Health Symptoms ?Depression:   ?-- Rich Reining) ?  ?Duration of Depressive symptoms:    ?Mania:   ?Irritability; Racing thoughts ?  ?Anxiety:    ?Worrying ?  ?Psychosis:   ?Grossly disorganized speech ?  ?Duration of Psychotic symptoms:  ?Duration of Psychotic Symptoms: Greater than six months ?  ?Trauma:   ?None ?  ?Obsessions:   ?-- Rich Reining) ?  ?Compulsions:   ?-- Rich Reining) ?  ?Inattention:   ?None ?  ?Hyperactivity/Impulsivity:   ?  N/A ?  ?Oppositional/Defiant Behaviors:   ?None ?  ?Emotional Irregularity:   ?Mood lability; Potentially harmful impulsivity ?  ?Other Mood/Personality Symptoms:   ?uta ?  ? ?Mental Status Exam ?Appearance and self-care  ?Stature:   ?Average ?  ?Weight:   ?Average weight ?  ?Clothing:   ?Disheveled; Casual (neglected hygiene) ?  ?Grooming:   ?Normal ?  ?Cosmetic use:   ?None ?  ?Posture/gait:   ?Normal ?  ?Motor activity:   ?Not Remarkable ?  ?Sensorium  ?Attention:   ?Normal ?  ?Concentration:   ?Anxiety interferes; Scattered ?  ?Orientation:   ?Person ?  ?Recall/memory:   ?Defective in Short-term; Defective in Immediate ?  ?Affect and Mood  ?Affect:   ?Anxious; Blunted ?  ?Mood:   ?Anxious ?  ?Relating  ?Eye contact:   ?Normal ?  ?Facial expression:   ?Anxious ?   ?Attitude toward examiner:   ?Cooperative; Guarded; Dramatic ?  ?Thought and Language  ?Speech flow:  ?Articulation error; Garbled; Pressured; Slurred ?  ?Thought content:   ?Appropriate to Mood and Circumst

## 2022-01-23 NOTE — Subjective & Objective (Addendum)
CC: aggressive towards parents ?HPI: ?41 year old African-American female with a history of paranoid schizophrenia, intellectual disability, atrial fibrillation, history of aggressive behavior who presents with [place] to [employer] from behavioral health urgent care.  Reportedly patient has been aggressive toward her parents and not taking her medications. ? ?Patient unable to give any history or review of systems due to her mental illness.  She continues to repeat "I am scared I am scared I am scared". she cannot answer any other questions. ? ?On arrival, temp 98.9 heart rate 90 blood pressure 106/77. ? ?Labs: Sodium 155, chloride 119, BUN of 46, creatinine 1.6, calcium 10.9 ? ?White count 6.5, hemoglobin 14.7, platelets 227 ? ?EKG showed rapid A-fib. ? ?Due to the patient's hyperatremia and acute kidney injury, [facility] hospitalist contacted for admission. ? ?Attempted to reach the patient's mother Dannielle Karvonen at [phone].  Called her twice.  No answer at the number. ? ?Additional history copied from [place] intake. ? ?"the patient stopped eating, drinking, and bathing 2 weeks ago.  She states that the patient has been exhibiting outbursts that she describes as the patient crying, screaming, and talking to herself as if multiple people are present almost every night between 2 AM to 6 AM. She states that the patient has been peeing on herself because she is afraid to go to the bathroom for the past 2 weeks. She states that she does not believe that the patient has had a bowel movement in 2 weeks. She states that the patient loss about 100 pounds within the past couple months and 15 pounds in the past 2 weeks. Ms. Mare Ferrari states that the patient was here in January and did good for 2 weeks after she was discharged. She states that the patient stopped taking her medications. Patient resides with her mother Dannielle Karvonen and her three children. " ?

## 2022-01-23 NOTE — Assessment & Plan Note (Addendum)
Pt is non-compliant with her psych meds. Therefore, not a good candidate for long term systemic anticoagulation. Also with her hx of violence/acting out physically, she has an increased risk of bleeding. [place] VASC score of 1. Could recommend taking ASA 81 mg daily if pt will be compliant with taking her psych meds.  Keep potassium >4 and Mg >2.0. was suppose to be on cardizem CD 120 mg daily per [facility]. Unable to reach family. Unclear if pt taking any meds at all. Update echo. ?

## 2022-01-23 NOTE — ED Notes (Signed)
Pt moved to [facility] due to pt in h12 being loud and disruptive. Pt given snacks and ginger-ale.  ?

## 2022-01-23 NOTE — Assessment & Plan Note (Signed)
Will need psych consult in AM to get her back on her psych meds. ?

## 2022-01-23 NOTE — H&P (Addendum)
?History and Physical  ? ? ?Kathy Brown RW:[id number] DOB: 1981/06/05 DOA: 01/23/2022 ? ?DOS: the patient was seen and examined on 01/23/2022 ? ?PCP: Pcp, No  ? ?Patient coming from: Home ? ?I have personally briefly reviewed patient's old medical records in [place] ? ?CC: aggressive towards parents ?HPI: ?41 year old African-American female with a history of paranoid schizophrenia, intellectual disability, atrial fibrillation, history of aggressive behavior who presents with [place] to [employer] from behavioral health urgent care.  Reportedly patient has been aggressive toward her parents and not taking her medications. ? ?Patient unable to give any history or review of systems due to her mental illness.  She continues to repeat "I am scared I am scared I am scared". she cannot answer any other questions. ? ?On arrival, temp 98.9 heart rate 90 blood pressure 106/77. ? ?Labs: Sodium 155, chloride 119, BUN of 46, creatinine 1.6, calcium 10.9 ? ?White count 6.5, hemoglobin 14.7, platelets 227 ? ?EKG showed rapid A-fib. ? ?Due to the patient's hyperatremia and acute kidney injury, [facility] hospitalist contacted for admission. ? ?Attempted to reach the patient's mother Dannielle Karvonen at [phone].  Called her twice.  No answer at the number. ? ?Additional history copied from [place] intake. ? ?"the patient stopped eating, drinking, and bathing 2 weeks ago.  She states that the patient has been exhibiting outbursts that she describes as the patient crying, screaming, and talking to herself as if multiple people are present almost every night between 2 AM to 6 AM. She states that the patient has been peeing on herself because she is afraid to go to the bathroom for the past 2 weeks. She states that she does not believe that the patient has had a bowel movement in 2 weeks. She states that the patient loss about 100 pounds within the past couple months and 15 pounds in the past 2 weeks. Ms. Mare Ferrari states  that the patient was here in January and did good for 2 weeks after she was discharged. She states that the patient stopped taking her medications. Patient resides with her mother Dannielle Karvonen and her three children. "  ? ?ED Course: Noted to be hyponatremic with mild acute kidney injury.  EKG shows rapid A-fib. ? ?Review of Systems:  ?Review of Systems  ?Unable to perform ROS: Psychiatric disorder  ? ?Past Medical History:  ?Diagnosis Date  ? A-fib ([place])   ? Anxiety   ? Atrial fibrillation with RVR ([place]) 10/15/2017  ? Auditory hallucinations   ? Chronic back pain   ? Dysrhythmia   ? Atrial fibrillation  ? Paranoid schizophrenia ([place])   ? Psychosis ([place]) 03/25/2014  ? Schizoaffective disorder ([place]) 03/25/2014  ? Schizophrenia ([place])   ? Urinary incontinence   ? ? ?Past Surgical History:  ?Procedure Laterality Date  ? TOOTH EXTRACTION Bilateral 09/08/2018  ? Procedure: DENTAL RESTORATION/EXTRACTIONS;  Surgeon: Diona Browner, DDS;  Location: [place];  Service: Oral Surgery;  Laterality: Bilateral;  ? ? ? reports that she has never smoked. She has never used smokeless tobacco. She reports that she does not drink alcohol and does not use drugs. ? ?No Known Allergies ? ?Family History  ?Problem Relation Age of Onset  ? Hypertension Mother   ? Diabetes Mother   ? ? ?Prior to Admission medications   ?Medication Sig Start Date End Date Taking? Authorizing Provider  ?benztropine (COGENTIN) 0.5 MG tablet Take 1 tablet (0.5 mg total) by mouth 2 (two) times daily as needed for  tremors (Extraparametal side effects). 11/22/21   Ival Bible, MD  ?diltiazem (CARDIZEM CD) 120 MG 24 hr capsule Take 1 capsule (120 mg total) by mouth daily. ?Patient taking differently: Take 120 mg by mouth daily. LF 02/11/21 01/15/21   Ethelene Hal, NP  ?hydrOXYzine (ATARAX) 25 MG tablet Take 1 tablet (25 mg total) by mouth 3 (three) times daily as needed for anxiety. 11/22/21   Ival Bible, MD  ?risperiDONE  (RISPERDAL) 1 MG tablet Take 1 tablet (1 mg total) by mouth 2 (two) times daily. 11/22/21 12/22/21  Ival Bible, MD  ?traZODone (DESYREL) 50 MG tablet Take 1 tablet (50 mg total) by mouth at bedtime as needed for sleep. 11/22/21   Ival Bible, MD  ? ? ?Physical Exam: ?Vitals:  ? 01/23/22 2002 01/23/22 2113 01/23/22 2229  ?BP: 106/77 110/80 106/85  ?Pulse: 90 89 85  ?Resp: 17 18 18   ?Temp: 98.9 ?F (37.2 ?C) 98.5 ?F (36.9 ?C)   ?TempSrc:  Oral   ?SpO2: 98% 99% 100%  ?Weight:  77.1 kg   ?Height:  5\' 4"  (1.626 m)   ? ? ?Physical Exam ?Vitals and nursing note reviewed.  ?Constitutional:   ?   Comments: Appears chronically ill. ?Appears to be struggling with internal stimuli.  ?HENT:  ?   Head: Normocephalic and atraumatic.  ?   Nose: Nose normal. No rhinorrhea.  ?Cardiovascular:  ?   Rate and Rhythm: Tachycardia present. Rhythm irregular.  ?Pulmonary:  ?   Effort: Pulmonary effort is normal. No respiratory distress.  ?   Breath sounds: No rales.  ?Abdominal:  ?   General: Abdomen is flat. Bowel sounds are normal. There is distension.  ?   Tenderness: There is no abdominal tenderness. There is no guarding.  ?Musculoskeletal:  ?   Right lower leg: No edema.  ?   Left lower leg: No edema.  ?Skin: ?   General: Skin is warm and dry.  ?   Capillary Refill: Capillary refill takes less than 2 seconds.  ?Neurological:  ?   Mental Status: She is disoriented.  ?  ? ?Labs on Admission: I have personally reviewed following labs and imaging studies ? ?CBC: ?Recent Labs  ?Lab 01/23/22 ?2025  ?WBC 6.5  ?HGB 14.7  ?HCT 48.0*  ?MCV 90.6  ?PLT 227  ? ?Basic Metabolic Panel: ?Recent Labs  ?Lab 01/23/22 ?2025  ?NA 155*  ?K 3.9  ?CL 119*  ?CO2 26  ?GLUCOSE 121*  ?BUN 46*  ?CREATININE 1.62*  ?CALCIUM 10.9*  ? ?GFR: ?Estimated Creatinine Clearance: 47.3 mL/min (A) (by C-G formula based on SCr of 1.62 mg/dL (H)). ?Liver Function Tests: ?Recent Labs  ?Lab 01/23/22 ?2025  ?AST 26  ?ALT 17  ?ALKPHOS 34*  ?BILITOT 1.3*  ?PROT 8.7*   ?ALBUMIN 4.9  ? ?No results for input(s): LIPASE, AMYLASE in the last 168 hours. ?No results for input(s): AMMONIA in the last 168 hours. ?Coagulation Profile: ?No results for input(s): INR, PROTIME in the last 168 hours. ?Cardiac Enzymes: ?No results for input(s): CKTOTAL, CKMB, CKMBINDEX, TROPONINI, TROPONINIHS in the last 168 hours. ?BNP (last 3 results) ?No results for input(s): PROBNP in the last 8760 hours. ?HbA1C: ?No results for input(s): HGBA1C in the last 72 hours. ?CBG: ?No results for input(s): GLUCAP in the last 168 hours. ?Lipid Profile: ?No results for input(s): CHOL, HDL, LDLCALC, TRIG, CHOLHDL, LDLDIRECT in the last 72 hours. ?Thyroid Function Tests: ?No results for input(s): TSH, T4TOTAL, FREET4, T3FREE, THYROIDAB in  the last 72 hours. ?Anemia Panel: ?No results for input(s): VITAMINB12, FOLATE, FERRITIN, TIBC, IRON, RETICCTPCT in the last 72 hours. ?Urine analysis: ?   ?Component Value Date/Time  ? [place] YELLOW 10/05/2021 2100  ? APPEARANCEUR HAZY (A) 10/05/2021 2100  ? LABSPEC 1.011 10/05/2021 2100  ? PHURINE 6.0 10/05/2021 2100  ? [place] NEGATIVE 10/05/2021 2100  ? [place] NEGATIVE 10/05/2021 2100  ? [place] NEGATIVE 10/05/2021 2100  ? KETONESUR 20 (A) 10/05/2021 2100  ? [place] NEGATIVE 10/05/2021 2100  ? UROBILINOGEN 0.2 09/20/2014 1135  ? NITRITE NEGATIVE 10/05/2021 2100  ? LEUKOCYTESUR MODERATE (A) 10/05/2021 2100  ? ? ?Radiological Exams on Admission: I have personally reviewed images ?No results found. ? ?EKG: I have personally reviewed EKG: rapid afib ? ? ? ?Assessment/Plan ?Principal Problem: ?  Hypernatremia ?Active Problems: ?  AKI (acute kidney injury) ([place]) ?  Paranoid schizophrenia ([place]) ?  A-fib ([place]) ?  Rapid atrial fibrillation ([place]) ?  ? ?Assessment and Plan: ?* Hypernatremia ?Observation telemetry bed. About 4 liter free water deficit. Start D5W@ 150 ml/hr. Repeat BMP in 4 hours. Keep serum potassium >4 and serum Mg >2.0. likely cause is due to poor po intake. No  diuretics listed on MAR. Reportedly, Pt has not been taking any psych meds and lithium is not on her meds list.  Therefore, diabetes insipidus is less likely. ? ?AKI (acute kidney injury) ([place]) ?Continue with

## 2022-01-23 NOTE — ED Notes (Signed)
Called [name], CN @[facility]  of pt's expectancy by Dr. due to unsteady gait and falling backwards when ambulating. EMS non-emergency called for transport ?

## 2022-01-23 NOTE — ED Triage Notes (Signed)
Pt presents to [facility] voluntarily escorted by [name]. Per [name] pt has been aggressive towards her parents who she lives with and she has not been eating or taking her medications. Per [name] pt has been diagnosed with schizophrenia.Pt appears to be cognitively delayed, and pt has urinated on herself. Pt just repeats " I am scared, I am scared". Pt denies SI/HI and AVH. ?

## 2022-01-23 NOTE — ED Notes (Signed)
Kathy Brown frazier [phone] would like a call with updates  ?

## 2022-01-23 NOTE — Assessment & Plan Note (Signed)
Continue with IVF. Hold nephrotoxic drugs. ?

## 2022-01-23 NOTE — Assessment & Plan Note (Signed)
Observation telemetry bed. About 4 liter free water deficit. Start D5W@ 150 ml/hr. Repeat BMP in 4 hours. Keep serum potassium >4 and serum Mg >2.0. likely cause is due to poor po intake. No diuretics listed on MAR. Reportedly, Pt has not been taking any psych meds and lithium is not on her meds list.  Therefore, diabetes insipidus is less likely. ?

## 2022-01-23 NOTE — ED Triage Notes (Signed)
Patient brought to ED via EMS from [facility].  Notes from [facility] state that patient arrived via [name] for psych evaluation.  Patient states that someone in her house called the police, she states that she has not been eating and is cold and feeling weak.  Patient denies any SI/HI at this time. She denies any hallucinations.  Patient denies that she takes any medication.  She does not know why she is here at the ED, she states that she is scared.   ?

## 2022-01-23 NOTE — ED Provider Notes (Signed)
?[facility] ?Provider Note ? ? ?CSN: [id number] ?Arrival date & time: 01/23/22  1956 ? ?  ? ?History ?Chief Complaint  ?Patient presents with  ? Medical Clearance  ? ? ?Kathy Brown is a 41 y.o. female with history of schizophrenia presenting from [facility] due to concerns for underlying medical problems.  Patient has not been taking her medication. Has been scared to use the bathroom. Decreased intake. Weight loss. Family is worried about her. Pt sent here for medical clearance. She denies any symptoms at this time. Previously endorsed auditory hallucinations but not at this time. Denies SI or HI. Mumbles speech and not answering questions directly.  ? ?HPI ? ?  ? ?Home Medications ?Prior to Admission medications   ?Medication Sig Start Date End Date Taking? Authorizing Provider  ?benztropine (COGENTIN) 0.5 MG tablet Take 1 tablet (0.5 mg total) by mouth 2 (two) times daily as needed for tremors (Extraparametal side effects). 11/22/21   Ival Bible, MD  ?diltiazem (CARDIZEM CD) 120 MG 24 hr capsule Take 1 capsule (120 mg total) by mouth daily. ?Patient taking differently: Take 120 mg by mouth daily. LF 02/11/21 01/15/21   Ethelene Hal, NP  ?hydrOXYzine (ATARAX) 25 MG tablet Take 1 tablet (25 mg total) by mouth 3 (three) times daily as needed for anxiety. 11/22/21   Ival Bible, MD  ?risperiDONE (RISPERDAL) 1 MG tablet Take 1 tablet (1 mg total) by mouth 2 (two) times daily. 11/22/21 12/22/21  Ival Bible, MD  ?traZODone (DESYREL) 50 MG tablet Take 1 tablet (50 mg total) by mouth at bedtime as needed for sleep. 11/22/21   Ival Bible, MD  ?   ? ?Allergies    ?Patient has no known allergies.   ? ?Review of Systems   ?Review of Systems  ?Unable to perform ROS: Psychiatric disorder  ? ?Physical Exam ?Updated Vital Signs ?BP 106/85 (BP Location: Right Arm)   Pulse 85   Temp 98.5 ?F (36.9 ?C) (Oral)   Resp 18   Ht 5' 4"  (1.626 m)   Wt 77.1 kg    SpO2 100%   BMI 29.18 kg/m?  ?Physical Exam ?Vitals and nursing note reviewed.  ?Constitutional:   ?   Comments: Muffled speech ?  ?HENT:  ?   Head: Normocephalic and atraumatic.  ?Eyes:  ?   Extraocular Movements: Extraocular movements intact.  ?   Pupils: Pupils are equal, round, and reactive to light.  ?Cardiovascular:  ?   Rate and Rhythm: Normal rate. Rhythm irregular.  ?Pulmonary:  ?   Effort: Pulmonary effort is normal. No respiratory distress.  ?Musculoskeletal:     ?   General: No deformity or signs of injury.  ?Neurological:  ?   Mental Status: She is alert and oriented to person, place, and time.  ?   Cranial Nerves: No cranial nerve deficit.  ?   Sensory: No sensory deficit.  ?   Motor: No weakness.  ?   Comments: To ambulate on her own, slight wobble with gait and walking slowly  ? ? ?ED Results / Procedures / Treatments   ?Labs ?(all labs ordered are listed, but only abnormal results are displayed) ?Labs Reviewed  ?COMPREHENSIVE METABOLIC PANEL - Abnormal; Notable for the following components:  ?    Result Value  ? Sodium 155 (*)   ? Chloride 119 (*)   ? Glucose, Bld 121 (*)   ? BUN 46 (*)   ? Creatinine, Ser 1.62 (*)   ?  Calcium 10.9 (*)   ? Total Protein 8.7 (*)   ? Alkaline Phosphatase 34 (*)   ? Total Bilirubin 1.3 (*)   ? GFR, Estimated 41 (*)   ? All other components within normal limits  ?CBC - Abnormal; Notable for the following components:  ? RBC 5.30 (*)   ? HCT 48.0 (*)   ? RDW 17.9 (*)   ? All other components within normal limits  ?ACETAMINOPHEN LEVEL - Abnormal; Notable for the following components:  ? Acetaminophen (Tylenol), Serum <10 (*)   ? All other components within normal limits  ?SALICYLATE LEVEL - Abnormal; Notable for the following components:  ? Salicylate Lvl <2.7 (*)   ? All other components within normal limits  ?ETHANOL  ?RAPID URINE DRUG SCREEN, HOSP PERFORMED  ?URINALYSIS, ROUTINE W REFLEX MICROSCOPIC  ?TSH  ?OSMOLALITY  ?OSMOLALITY, URINE  ?COMPREHENSIVE METABOLIC  PANEL  ?I-STAT BETA HCG BLOOD, ED ([facility], [facility], [facility] ONLY)  ? ? ?EKG ?None ? ?Radiology ?No results found. ? ?Procedures ?Procedures  ? ? ?Medications Ordered in ED ?Medications  ?traZODone (DESYREL) tablet 50 mg (has no administration in time range)  ?risperiDONE (RISPERDAL) tablet 1 mg (has no administration in time range)  ?hydrOXYzine (ATARAX) tablet 25 mg (has no administration in time range)  ?benztropine (COGENTIN) tablet 0.5 mg (has no administration in time range)  ?dextrose 5 % solution (150 mL/hr Intravenous New Bag/Given 01/23/22 2307)  ?diltiazem (CARDIZEM) 125 mg in dextrose 5% 125 mL (1 mg/mL) infusion (has no administration in time range)  ?magnesium sulfate IVPB 2 g 50 mL (has no administration in time range)  ? ? ?ED Course/ Medical Decision Making/ A&P ? ?                        ?Medical Decision Making ?Amount and/or Complexity of Data Reviewed ?Labs: ordered. ? ?Risk ?Prescription drug management. ?Decision regarding hospitalization. ? ? ?41 year old female schizophrenia presenting for inability to care for self at home.  Not taking her medications.  Concern for auditory hallucinations prior to arrival. ? ?Clinical Course as of 01/24/22 0029  ?Thu Jan 23, 2022  ?2152 AKI w/ Cr doubled from baseline at 1.62. Prior Cr 0.93. Hypernatremia 155, Hyperchloremia 119, Glucose 121, BUN 46, alk phos 34, Bili 1.3, GFR 41. Hg elevated in comparison to prior. Suspect hemoconcentration given overall labs and decreased PO.  [RK]  ?2201 Seeking admission for hypernatremia. Suspected secondary to decreased PO and decreased UOP. Additional testing ordered.  [RK]  ?2327 Started D5W at 159m/hr. Repeat CMP ordered for 0300.  [RK]  ?  ?Clinical Course User Index ?[RK] KLupita Dawn MD  ? ?Spoke with hospitalist who agrees with admission.  Patient's EKG consistent with A-fib which patient is known to have per chart review.  Patient admitted to the hospitalist service at this time for management of her hyponatremia and other  electrolyte derangements. ? ?Patient seen in conjunction with my attending Dr. HDina Rich ? ?Final Clinical Impression(s) / ED Diagnoses ?Final diagnoses:  ?Hypernatremia  ?[place]) [place]  ? ? ?Rx / DC Orders ?ED Discharge Orders   ? ? None  ? ?  ? ? ?  ?KLupita Dawn MD ?01/24/22 0031 ? ?  ?HLorelle Gibbs DO ?01/24/22 2214 ? ?

## 2022-01-23 NOTE — Assessment & Plan Note (Signed)
Start cardizem gtts. ?

## 2022-01-24 ENCOUNTER — Observation Stay (HOSPITAL_COMMUNITY): Payer: Medicare Other

## 2022-01-24 DIAGNOSIS — R109 Unspecified abdominal pain: Secondary | ICD-10-CM | POA: Diagnosis not present

## 2022-01-24 DIAGNOSIS — R456 Violent behavior: Secondary | ICD-10-CM | POA: Diagnosis not present

## 2022-01-24 DIAGNOSIS — T43506A Underdosing of unspecified antipsychotics and neuroleptics, initial encounter: Secondary | ICD-10-CM | POA: Diagnosis present

## 2022-01-24 DIAGNOSIS — Z7982 Long term (current) use of aspirin: Secondary | ICD-10-CM | POA: Diagnosis not present

## 2022-01-24 DIAGNOSIS — I952 Hypotension due to drugs: Secondary | ICD-10-CM | POA: Diagnosis not present

## 2022-01-24 DIAGNOSIS — N179 Acute kidney failure, unspecified: Secondary | ICD-10-CM | POA: Diagnosis present

## 2022-01-24 DIAGNOSIS — R627 Adult failure to thrive: Secondary | ICD-10-CM | POA: Diagnosis present

## 2022-01-24 DIAGNOSIS — T461X5A Adverse effect of calcium-channel blockers, initial encounter: Secondary | ICD-10-CM | POA: Diagnosis not present

## 2022-01-24 DIAGNOSIS — F200 Paranoid schizophrenia: Secondary | ICD-10-CM | POA: Diagnosis present

## 2022-01-24 DIAGNOSIS — E860 Dehydration: Secondary | ICD-10-CM | POA: Diagnosis present

## 2022-01-24 DIAGNOSIS — F419 Anxiety disorder, unspecified: Secondary | ICD-10-CM | POA: Diagnosis present

## 2022-01-24 DIAGNOSIS — Y92230 Patient room in hospital as the place of occurrence of the external cause: Secondary | ICD-10-CM | POA: Diagnosis not present

## 2022-01-24 DIAGNOSIS — I5022 Chronic systolic (congestive) heart failure: Secondary | ICD-10-CM | POA: Diagnosis present

## 2022-01-24 DIAGNOSIS — M549 Dorsalgia, unspecified: Secondary | ICD-10-CM | POA: Diagnosis present

## 2022-01-24 DIAGNOSIS — I4821 Permanent atrial fibrillation: Secondary | ICD-10-CM | POA: Diagnosis present

## 2022-01-24 DIAGNOSIS — K5900 Constipation, unspecified: Secondary | ICD-10-CM | POA: Diagnosis not present

## 2022-01-24 DIAGNOSIS — E861 Hypovolemia: Secondary | ICD-10-CM | POA: Diagnosis present

## 2022-01-24 DIAGNOSIS — I4891 Unspecified atrial fibrillation: Secondary | ICD-10-CM

## 2022-01-24 DIAGNOSIS — I110 Hypertensive heart disease with heart failure: Secondary | ICD-10-CM | POA: Diagnosis present

## 2022-01-24 DIAGNOSIS — I429 Cardiomyopathy, unspecified: Secondary | ICD-10-CM | POA: Diagnosis present

## 2022-01-24 DIAGNOSIS — E871 Hypo-osmolality and hyponatremia: Secondary | ICD-10-CM | POA: Diagnosis present

## 2022-01-24 DIAGNOSIS — E870 Hyperosmolality and hypernatremia: Secondary | ICD-10-CM | POA: Diagnosis not present

## 2022-01-24 DIAGNOSIS — Z79899 Other long term (current) drug therapy: Secondary | ICD-10-CM | POA: Diagnosis not present

## 2022-01-24 DIAGNOSIS — I482 Chronic atrial fibrillation, unspecified: Secondary | ICD-10-CM | POA: Diagnosis not present

## 2022-01-24 DIAGNOSIS — G8929 Other chronic pain: Secondary | ICD-10-CM | POA: Diagnosis present

## 2022-01-24 DIAGNOSIS — E785 Hyperlipidemia, unspecified: Secondary | ICD-10-CM | POA: Diagnosis present

## 2022-01-24 DIAGNOSIS — G9341 Metabolic encephalopathy: Secondary | ICD-10-CM | POA: Diagnosis present

## 2022-01-24 DIAGNOSIS — F79 Unspecified intellectual disabilities: Secondary | ICD-10-CM | POA: Diagnosis present

## 2022-01-24 DIAGNOSIS — Z6829 Body mass index (BMI) 29.0-29.9, adult: Secondary | ICD-10-CM | POA: Diagnosis not present

## 2022-01-24 LAB — URINALYSIS, ROUTINE W REFLEX MICROSCOPIC
Bilirubin Urine: NEGATIVE
Glucose, UA: NEGATIVE mg/dL
Hgb urine dipstick: NEGATIVE
Ketones, ur: NEGATIVE mg/dL
Leukocytes,Ua: NEGATIVE
Nitrite: NEGATIVE
Protein, ur: NEGATIVE mg/dL
Specific Gravity, Urine: 1.003 — ABNORMAL LOW (ref 1.005–1.030)
pH: 6 (ref 5.0–8.0)

## 2022-01-24 LAB — MAGNESIUM: Magnesium: 2.3 mg/dL (ref 1.7–2.4)

## 2022-01-24 LAB — I-STAT ARTERIAL BLOOD GAS, ED
Acid-Base Excess: 3 mmol/L — ABNORMAL HIGH (ref 0.0–2.0)
Bicarbonate: 27.2 mmol/L (ref 20.0–28.0)
Calcium, Ion: 1.25 mmol/L (ref 1.15–1.40)
HCT: 33 % — ABNORMAL LOW (ref 36.0–46.0)
Hemoglobin: 11.2 g/dL — ABNORMAL LOW (ref 12.0–15.0)
O2 Saturation: 96 %
Potassium: 3.4 mmol/L — ABNORMAL LOW (ref 3.5–5.1)
Sodium: 139 mmol/L (ref 135–145)
TCO2: 28 mmol/L (ref 22–32)
pCO2 arterial: 37.7 mmHg (ref 32–48)
pH, Arterial: 7.465 — ABNORMAL HIGH (ref 7.35–7.45)
pO2, Arterial: 78 mmHg — ABNORMAL LOW (ref 83–108)

## 2022-01-24 LAB — BASIC METABOLIC PANEL
Anion gap: 7 (ref 5–15)
BUN: 42 mg/dL — ABNORMAL HIGH (ref 6–20)
CO2: 27 mmol/L (ref 22–32)
Calcium: 9.3 mg/dL (ref 8.9–10.3)
Chloride: 107 mmol/L (ref 98–111)
Creatinine, Ser: 1.13 mg/dL — ABNORMAL HIGH (ref 0.44–1.00)
GFR, Estimated: >60 mL/min (ref >60)
Glucose, Bld: 196 mg/dL — ABNORMAL HIGH (ref 70–99)
Potassium: 3.3 mmol/L — ABNORMAL LOW (ref 3.5–5.1)
Sodium: 141 mmol/L (ref 135–145)

## 2022-01-24 LAB — ECHOCARDIOGRAM COMPLETE
AR max vel: 2.54 cm2
AV Area VTI: 2.37 cm2
AV Area mean vel: 2.51 cm2
AV Mean grad: 2 mmHg
AV Peak grad: 3.9 mmHg
Ao pk vel: 0.98 m/s
Calc EF: 43.1 %
Height: 64 in
S' Lateral: 1.9 cm
Single Plane A2C EF: 30.7 %
Single Plane A4C EF: 48.7 %
Weight: 2719.59 oz

## 2022-01-24 LAB — COMPREHENSIVE METABOLIC PANEL
ALT: 15 U/L (ref 0–44)
AST: 19 U/L (ref 15–41)
Albumin: 3.6 g/dL (ref 3.5–5.0)
Alkaline Phosphatase: 26 U/L — ABNORMAL LOW (ref 38–126)
Anion gap: 5 (ref 5–15)
BUN: 42 mg/dL — ABNORMAL HIGH (ref 6–20)
CO2: 28 mmol/L (ref 22–32)
Calcium: 9.1 mg/dL (ref 8.9–10.3)
Chloride: 109 mmol/L (ref 98–111)
Creatinine, Ser: 0.96 mg/dL (ref 0.44–1.00)
GFR, Estimated: >60 mL/min (ref >60)
Glucose, Bld: 165 mg/dL — ABNORMAL HIGH (ref 70–99)
Potassium: 3.2 mmol/L — ABNORMAL LOW (ref 3.5–5.1)
Sodium: 142 mmol/L (ref 135–145)
Total Bilirubin: 1.4 mg/dL — ABNORMAL HIGH (ref 0.3–1.2)
Total Protein: 6.4 g/dL — ABNORMAL LOW (ref 6.5–8.1)

## 2022-01-24 LAB — OSMOLALITY: Osmolality: 321 mOsm/kg (ref 275–295)

## 2022-01-24 LAB — TSH: TSH: 0.584 u[IU]/mL (ref 0.350–4.500)

## 2022-01-24 LAB — HIV ANTIBODY (ROUTINE TESTING W REFLEX): HIV Screen 4th Generation wRfx: NONREACTIVE

## 2022-01-24 MED ORDER — ACETAMINOPHEN 325 MG PO TABS
650.0000 mg | ORAL_TABLET | ORAL | Status: DC | PRN
  Administered 2022-01-25 – 2022-01-29 (×4): 650 mg via ORAL
  Filled 2022-01-24 (×4): qty 2

## 2022-01-24 MED ORDER — LORAZEPAM 2 MG/ML IJ SOLN
2.0000 mg | Freq: Once | INTRAMUSCULAR | Status: AC
  Administered 2022-01-24: 2 mg via INTRAVENOUS
  Filled 2022-01-24: qty 1

## 2022-01-24 MED ORDER — ENOXAPARIN SODIUM 40 MG/0.4ML IJ SOSY
40.0000 mg | PREFILLED_SYRINGE | INTRAMUSCULAR | Status: DC
  Administered 2022-01-25 – 2022-01-27 (×3): 40 mg via SUBCUTANEOUS
  Filled 2022-01-24 (×7): qty 0.4

## 2022-01-24 MED ORDER — BISACODYL 5 MG PO TBEC
10.0000 mg | DELAYED_RELEASE_TABLET | Freq: Every day | ORAL | Status: AC
Start: 2022-01-24 — End: 2022-01-25
  Administered 2022-01-24 – 2022-01-25 (×2): 10 mg via ORAL
  Filled 2022-01-24 (×2): qty 2

## 2022-01-24 MED ORDER — ASPIRIN EC 81 MG PO TBEC
81.0000 mg | DELAYED_RELEASE_TABLET | Freq: Every day | ORAL | Status: DC
  Administered 2022-01-24 – 2022-01-31 (×8): 81 mg via ORAL
  Filled 2022-01-24 (×8): qty 1

## 2022-01-24 MED ORDER — DILTIAZEM HCL ER COATED BEADS 120 MG PO CP24
120.0000 mg | ORAL_CAPSULE | Freq: Every day | ORAL | Status: DC
  Filled 2022-01-24: qty 1

## 2022-01-24 MED ORDER — SODIUM CHLORIDE 0.9 % IV BOLUS: 500.0000 mL | INTRAVENOUS | Status: DC

## 2022-01-24 MED ORDER — ONDANSETRON HCL 4 MG/2ML IJ SOLN
4.0000 mg | Freq: Four times a day (QID) | INTRAMUSCULAR | Status: DC | PRN
  Administered 2022-01-25: 4 mg via INTRAVENOUS
  Filled 2022-01-24: qty 2

## 2022-01-24 MED ORDER — POLYETHYLENE GLYCOL 3350 17 G PO PACK: 34.0000 g | PACK | Freq: Once | ORAL | Status: DC

## 2022-01-24 MED ORDER — SODIUM CHLORIDE 0.9 % IV BOLUS
500.0000 mL | INTRAVENOUS | Status: AC
  Administered 2022-01-24: 500 mL via INTRAVENOUS

## 2022-01-24 MED ORDER — POTASSIUM CHLORIDE CRYS ER 20 MEQ PO TBCR
40.0000 meq | EXTENDED_RELEASE_TABLET | Freq: Two times a day (BID) | ORAL | Status: AC
  Administered 2022-01-24 (×2): 40 meq via ORAL
  Filled 2022-01-24 (×2): qty 2

## 2022-01-24 MED ORDER — LORAZEPAM 2 MG/ML IJ SOLN
0.5000 mg | Freq: Once | INTRAMUSCULAR | Status: AC
  Administered 2022-01-24: 0.5 mg via INTRAVENOUS
  Filled 2022-01-24: qty 1

## 2022-01-24 MED ORDER — RISPERIDONE 1 MG PO TABS
1.0000 mg | ORAL_TABLET | Freq: Two times a day (BID) | ORAL | Status: DC
  Administered 2022-01-24 – 2022-01-31 (×15): 1 mg via ORAL
  Filled 2022-01-24 (×20): qty 1

## 2022-01-24 MED ORDER — METOPROLOL TARTRATE 25 MG PO TABS
12.5000 mg | ORAL_TABLET | ORAL | Status: AC
  Administered 2022-01-24: 12.5 mg via ORAL
  Filled 2022-01-24: qty 1

## 2022-01-24 MED ORDER — SODIUM CHLORIDE 0.9 % IV BOLUS
250.0000 mL | INTRAVENOUS | Status: AC
  Administered 2022-01-24: 250 mL via INTRAVENOUS

## 2022-01-24 NOTE — Progress Notes (Signed)
? ? ?PROGRESS NOTE ? ?Kathy Brown [id number] DOB: Jan 13, 1981 DOA: 01/23/2022 ?PCP: Pcp, No ? ?HPI/Recap of past 46 hours: ?41 year old African-American female with a history of paranoid schizophrenia, intellectual disability, permanent atrial fibrillation not on oral anticoagulation, history of aggressive behavior who presents with [facility] [employer] from behavioral health urgent care.  Reportedly patient has been aggressive toward her parents and not taking her medications.  Labs significant for hypernatremia with serum sodium of 155, hypovolemic.  Acute kidney injury likely prerenal from dehydration.  Admitted by hospitalist service, [place].  Psychiatry following. ?  ?Additional history copied from [facility] intake. ?  ?"the patient stopped eating, drinking, and bathing 2 weeks ago.  She states that the patient has been exhibiting outbursts that she describes as the patient crying, screaming, and talking to herself as if multiple people are present almost every night between 2 AM to 6 AM. She states that the patient has been peeing on herself because she is afraid to go to the bathroom for the past 2 weeks. She states that she does not believe that the patient has had a bowel movement in 2 weeks. She states that the patient loss about 100 pounds within the past couple months and 15 pounds in the past 2 weeks. Ms. Mare Ferrari states that the patient was here in January and did good for 2 weeks after she was discharged. She states that the patient stopped taking her medications. Patient resides with her mother Dannielle Karvonen and her three children. "  ?  ? ?01/24/2022: Patient was seen and examined at bedside.  She is alert and confused.  She denies having any pain. ? ?Assessment/Plan: ?Principal Problem: ?  Hypernatremia ?Active Problems: ?  AKI (acute kidney injury) ([place]) ?  Paranoid schizophrenia ([place]) ?  A-fib ([place]) ?  Rapid atrial fibrillation ([place]) ? ?Resolved hypovolemic hypernatremia ?Presented with serum  sodium of 155, hypovolemic on exam, received D5W at 100 cc/h. ?Hypernatremia has resolved.  Stopped IV fluid this morning. ?Patient denies any nausea or abdominal pain, encourage oral fluid intake to avoid dehydration. ?  ?Resolved post IV fluid hydration: Prerenal AKI (acute kidney injury) ([place]) ?Continue to monitor toxic agents, dehydration and hypotension. ?  ?Permanent A-fib ([place]) with RVR, resolved ?Pt is non-compliant with her psych meds. Therefore, not a good candidate for long term systemic anticoagulation. Also with her hx of violence/acting out physically, she has an increased risk of bleeding. [place] VASC score of 1. Could recommend taking ASA 81 mg daily if pt will be compliant with taking her psych meds.  Keep potassium >4 and Mg >2.0. was suppose to be on cardizem CD 120 mg daily per [facility]. Unable to reach family.  ?Initially on Cardizem drip on admission, stopped.  RVR has resolved. ?Repeat twelve-lead EKG ?Last 2D echo done in 2019 with LVEF 55 to 60% with no regional wall motion abnormalities. ?Repeat 2D echo. ?Started calcium channel blocker, low-dose p.o. diltiazem 120 mg daily. ?  ?Paranoid schizophrenia ([place]) ?Psych following ?Management per psych ?  ?Acute metabolic encephalopathy, suspect multifactorial ?Contributed by hypernatremia, underlying psych issues ?Continue to treat underlying conditions ?Reorient as needed ?  ?  ?DVT prophylaxis: Subcu Lovenox daily ?Code Status: Full Code by default ?Family Communication: no family at bedside. Attempted to contact pt's mother tamary frazier x 2 without success.  ?Disposition Plan: return home vs admit to [facility] after discharge  ?Consults called: Psychiatry ?Admission status: Observation, Telemetry bed ?  ?  ? ? ? ?Status is: Observation ? ? ? ? ?  Objective: ?Vitals:  ? 01/24/22 0900 01/24/22 0930 01/24/22 1000 01/24/22 1030  ?BP: 100/69 95/62 93/75  104/76  ?Pulse: 87 86 81 96  ?Resp: 14 13 14 19   ?Temp:      ?TempSrc:      ?SpO2: 100% 99% 99% 100%   ?Weight:      ?Height:      ? ? ?Intake/Output Summary (Last 24 hours) at 01/24/2022 1124 ?Last data filed at 01/24/2022 0930 ?Gross per 24 hour  ?Intake 1050.42 ml  ?Output --  ?Net 1050.42 ml  ? ?Filed Weights  ? 01/23/22 2113  ?Weight: 77.1 kg  ? ? ?Exam: ? ?General: 41 y.o. year-old female well developed well nourished in no acute distress.  Alert and confused. ?Cardiovascular: Irregular rate and rhythm with no rubs or gallops.  No thyromegaly or JVD noted.   ?Respiratory: Clear to auscultation with no wheezes or rales. Good inspiratory effort. ?Abdomen: Nontender bowel sounds present.  ?Musculoskeletal: No lower extremity edema.  ?Skin: No ulcerative lesions noted or rashes, ?Psychiatry: Mood is appropriate for condition and setting ? ? ?Data Reviewed: ?CBC: ?Recent Labs  ?Lab 01/23/22 ?2025  ?WBC 6.5  ?HGB 14.7  ?HCT 48.0*  ?MCV 90.6  ?PLT 227  ? ?Basic Metabolic Panel: ?Recent Labs  ?Lab 01/23/22 ?2025 01/24/22 ?[id number] 01/24/22 ?[id number]  ?NA 155* 142 141  ?K 3.9 3.2* 3.3*  ?CL 119* 109 107  ?CO2 26 28 27   ?GLUCOSE 121* 165* 196*  ?BUN 46* 42* 42*  ?CREATININE 1.62* 0.96 1.13*  ?CALCIUM 10.9* 9.1 9.3  ?MG  --   --  2.3  ? ?GFR: ?Estimated Creatinine Clearance: 67.9 mL/min (A) (by C-G formula based on SCr of 1.13 mg/dL (H)). ?Liver Function Tests: ?Recent Labs  ?Lab 01/23/22 ?2025 01/24/22 ?[id number]  ?AST 26 19  ?ALT 17 15  ?ALKPHOS 34* 26*  ?BILITOT 1.3* 1.4*  ?PROT 8.7* 6.4*  ?ALBUMIN 4.9 3.6  ? ?No results for input(s): LIPASE, AMYLASE in the last 168 hours. ?No results for input(s): AMMONIA in the last 168 hours. ?Coagulation Profile: ?No results for input(s): INR, PROTIME in the last 168 hours. ?Cardiac Enzymes: ?No results for input(s): CKTOTAL, CKMB, CKMBINDEX, TROPONINI in the last 168 hours. ?BNP (last 3 results) ?No results for input(s): PROBNP in the last 8760 hours. ?HbA1C: ?No results for input(s): HGBA1C in the last 72 hours. ?CBG: ?No results for input(s): GLUCAP in the last 168 hours. ?Lipid Profile: ?No  results for input(s): CHOL, HDL, LDLCALC, TRIG, CHOLHDL, LDLDIRECT in the last 72 hours. ?Thyroid Function Tests: ?Recent Labs  ?  01/24/22 ?[id number]  ?TSH 0.584  ? ?Anemia Panel: ?No results for input(s): VITAMINB12, FOLATE, FERRITIN, TIBC, IRON, RETICCTPCT in the last 72 hours. ?Urine analysis: ?   ?Component Value Date/Time  ? [place] YELLOW 10/05/2021 2100  ? APPEARANCEUR HAZY (A) 10/05/2021 2100  ? LABSPEC 1.011 10/05/2021 2100  ? PHURINE 6.0 10/05/2021 2100  ? [place] NEGATIVE 10/05/2021 2100  ? [place] NEGATIVE 10/05/2021 2100  ? [place] NEGATIVE 10/05/2021 2100  ? KETONESUR 20 (A) 10/05/2021 2100  ? [place] NEGATIVE 10/05/2021 2100  ? UROBILINOGEN 0.2 09/20/2014 1135  ? NITRITE NEGATIVE 10/05/2021 2100  ? LEUKOCYTESUR MODERATE (A) 10/05/2021 2100  ? ?Sepsis Labs: ?@LABRCNTIP (procalcitonin:4,lacticidven:4) ? ?)No results found for this or any previous visit (from the past 240 hour(s)).  ? ? ?Studies: ?No results found. ? ?Scheduled Meds: ? aspirin EC  81 mg Oral Daily  ? bisacodyl  10 mg Oral Daily  ? LORazepam  2 mg  Intravenous Once  ? polyethylene glycol  34 g Oral Once  ? potassium chloride  40 mEq Oral BID  ? risperiDONE  1 mg Oral BID  ? ? ?Continuous Infusions: ? diltiazem (CARDIZEM) infusion Stopped (01/24/22 0930)  ? ? ? LOS: 0 days  ? ? ? ?Kayleen Memos, MD ?[facility] ?Pager [phone] ? ?If 7PM-7AM, please contact night-coverage ?[email or web address] ?Password [email or web address] ?01/24/2022, 11:24 AM  ?  ? ? ? ? ? ? ? ? ? ? ?

## 2022-01-24 NOTE — Progress Notes (Signed)
Echo attempted. ED nurse stated patient was being transferred to 6E soon. Will attempt again as schedule permits. ?

## 2022-01-24 NOTE — Progress Notes (Signed)
?  Transition of Care (TOC) Screening Note ? ? ?Patient Details  ?Name: Kathy Brown ?Date of Birth: [birth year] ? ? ?Transition of Care (TOC) CM/SW Contact:    ?Delilah Shan, LCSWA ?Phone Number: ?01/24/2022, 5:17 PM ? ? ? ?Transition of Care Department [facility]) has reviewed patient and no TOC needs have been identified at this time. We will continue to monitor patient advancement through interdisciplinary progression rounds. If new patient transition needs arise, please place a TOC consult. ?  ?

## 2022-01-24 NOTE — Consult Note (Signed)
[facility] New Face-to-Face Psychiatric Evaluation ? ? ?Service Date: January 24, 2022 ?LOS:  LOS: 0 days  ? ? ?Assessment  ?Kathy SarkLatasha Brown is a 41 y.o. female admitted medically for 01/23/2022  7:56 PM for agitation/aggression towards family and not taking her medications. She carries the psychiatric diagnoses of paranoid schizophrenia and has a past medical history of  intellectual disability, atrial fibrillation. Psychiatry was consulted for Schizophrenia and medication management by Carollee HerterEric Chen MD.  ? ? ?Her current presentation of AVH, poor self care, and delusions is most consistent with Schizophrenia. She meets criteria for Schizophrenia based on AVH, poor self care, and delusions.  Current outpatient psychotropic medications include Risperdal and Cogentin and historically she has had a good response to these medications. She was not compliant with medications prior to admission as evidenced by report from patient's mother. On initial examination, patient laying in bed barely moving. Please see plan below for detailed recommendations.  ? ?When initially seen patient had enough symptoms concerning for Catatonia that a Bush-Francis was done and she scored a 12 so an Ativan challenge was done.  She had a negative response and was sedated by the medication.  We will not start at medications at this time as she is undergoing further medical workup.  Weekend consult team will see her to consider starting medications once medically stable.  Can consider Risperdal as she has had a good response to it [facility].. ? ?Bush-Francis Score (3/31): Immobility: 1, Staring: 1,  Echopraxia/Echolalia: 1,  Sterotyped: 1, Withdrawal: 2,  [place]: 3,  Perseveration: 3 ? ?Diagnoses:  ?Active Hospital problems: ?Principal Problem: ?  Hypernatremia ?Active Problems: ?  Paranoid schizophrenia (HCC) ?  A-fib (HCC) ?  AKI (acute kidney injury) (HCC) ?  Rapid atrial fibrillation (HCC) ?  ? ? ?Plan  ?##  Safety and Observation Level:  ?- Based on my clinical evaluation, I estimate the patient to be at low risk of self harm in the current setting ?- At this time, we recommend a routine level of observation. This decision is based on my review of the chart including patient's history and current presentation, interview of the patient, mental status examination, and consideration of suicide risk including evaluating suicidal ideation, plan, intent, suicidal or self-harm behaviors, risk factors, and protective factors. This judgment is based on our ability to directly address suicide risk, implement suicide prevention strategies and develop a safety plan while the patient is in the clinical setting. Please contact our team if there is a concern that risk level has changed. ? ? ?## Medications:  ?-We will not start any medications at this time. ?-Can consider Risperdal once medically stable ? ?## Medical Decision Making Capacity:  ?Not formally assessed  ? ?## Further Work-up:  ?-Per Primary Team ? ? ?-- most recent EKG on 3/30 had QtC of 372 ?-- Pertinent labwork reviewed earlier this admission includes:  ?CMP(3/31): Na: 155,  Cl: 119,  BUN: 46,  Creat: 1.62,  Ca: 10.9,  Total Protein: 8.7 ?CBC(3/30): WNL except RBC: 5.3,  HCT: 48.0,  RDW: 17.9 ? ?## Disposition:  ?-Per Primary Team ? ?## Behavioral / Environmental:  ?- ? ?##Legal Status ? ? ?Thank you for this consult request. Recommendations have been communicated to the primary team.  We will continue to follow at this time.  ? ?Lauro FranklinAlexander S [name], MD ? ? ?NEW + History  ?Relevant Aspects of Hospital Course:  ?Admitted on 01/23/2022 for agitation/aggression towards family and not  taking her medications. ? ?Patient Report:  ?Attempted to interview the patient in the ED. She was only to answer a few questions with yes/no but she was unable to articulate more complex answers as her speech was so significantly garbled. ? ?She reports that she has not had any previous  psychiatric diagnosis or hospitalizations (per chart review she has been admitted to [facility] 12/2020 with a diagnosis of paranoid schizophrenia).  She reports no history of suicide attempts. ? ?She reports no SI, HI, or AVH at this time. ? ? ? ?Collateral information:  ?Called patient's mother Kathy Brown, [phone]. ? ?She reports the diagnosis of Schizophrenia and Schizoaffective disorder.  Confirms the patient has not taken medications in about one month.  No known suicide attempts.  She reports patient stopped eating regularly a few weeks ago.  She reports that patient has been talking to herself in her room. ? ?Psychiatric History:  ?Information collected from Patient's mother ? ?Family psych history: Maternal Great Uncle- Schizophrenia ?No substance or Suicide. ? ? ?Social History:  ? ? ?Tobacco use: None ?Alcohol use: None ?Drug use: None ? ?Family History:  ? ?The patient's family history includes Diabetes in her mother; Hypertension in her mother. ? ?Medical History: ?Past Medical History:  ?Diagnosis Date  ? A-fib (HCC)   ? Anxiety   ? Atrial fibrillation with RVR (HCC) 10/15/2017  ? Auditory hallucinations   ? Chronic back pain   ? Dysrhythmia   ? Atrial fibrillation  ? Paranoid schizophrenia (HCC)   ? Psychosis (HCC) 03/25/2014  ? Schizoaffective disorder (HCC) 03/25/2014  ? Schizophrenia (HCC)   ? Urinary incontinence   ? ? ?Surgical History: ?Past Surgical History:  ?Procedure Laterality Date  ? TOOTH EXTRACTION Bilateral 09/08/2018  ? Procedure: DENTAL RESTORATION/EXTRACTIONS;  Surgeon: Ocie Doyne, DDS;  Location:  SURGERY CENTER;  Service: Oral Surgery;  Laterality: Bilateral;  ? ? ?Medications:  ? ?Current Facility-Administered Medications:  ?  acetaminophen (TYLENOL) tablet 650 mg, 650 mg, Oral, Q4H PRN, Carollee Herter, DO ?  aspirin EC tablet 81 mg, 81 mg, Oral, Daily, Carollee Herter, DO, 81 mg at 01/24/22 1041 ?  benztropine (COGENTIN) tablet 0.5 mg, 0.5 mg, Oral, BID PRN, Carollee Herter, DO ?   bisacodyl (DULCOLAX) EC tablet 10 mg, 10 mg, Oral, Daily, Carollee Herter, DO, 10 mg at 01/24/22 1040 ?  enoxaparin (LOVENOX) injection 40 mg, 40 mg, Subcutaneous, Q24H, Hall, Carole N, DO ?  hydrOXYzine (ATARAX) tablet 25 mg, 25 mg, Oral, TID PRN, Carollee Herter, DO, 25 mg at 01/24/22 0126 ?  ondansetron (ZOFRAN) injection 4 mg, 4 mg, Intravenous, Q6H PRN, Carollee Herter, DO ?  polyethylene glycol (MIRALAX / GLYCOLAX) packet 34 g, 34 g, Oral, Once, Carollee Herter, DO ?  potassium chloride SA (KLOR-CON M) CR tablet 40 mEq, 40 mEq, Oral, BID, Darlin Drop, DO, 40 mEq at 01/24/22 1040 ?  risperiDONE (RISPERDAL) tablet 1 mg, 1 mg, Oral, BID, Carollee Herter, DO, 1 mg at 01/24/22 0109 ?  traZODone (DESYREL) tablet 50 mg, 50 mg, Oral, QHS PRN, Carollee Herter, DO, 50 mg at 01/24/22 0126 ? ?Allergies: ?No Known Allergies ? ? ?  ?Objective  ?Vital signs:  ?Temp:  [97.6 ?F (36.4 ?C)-98.9 ?F (37.2 ?C)] 97.6 ?F (36.4 ?C) (03/31 1657) ?Pulse Rate:  [57-115] 62 (03/31 1657) ?Resp:  [12-21] 20 (03/31 1657) ?BP: (85-125)/(57-88) 94/76 (03/31 1657) ?SpO2:  [96 %-100 %] 100 % (03/31 1657) ?Weight:  [77.1 kg] 77.1 kg (03/30 2113) ? ?Psychiatric Specialty Exam: ? ?  Presentation  ?General Appearance: Disheveled; Bizarre ? ?Eye Contact:Minimal; Fleeting ? ?Speech:Garbled ? ?Speech Volume:Decreased ? ?Handedness:Right ? ? ?Mood and Affect  ?Mood:-- (poor) ? ?Affect:Flat ? ? ?Thought Process  ?Thought Processes:Disorganized ? ?Descriptions of Associations:Tangential ? ?Orientation:Partial (oriented to person and place but not month or year) ? ?Thought Content:Illogical; Rumination ?No SI, HI, or AVH.  When attempting to ask about Paranoia, Ideas of Reference, or First Rank symptoms cannot get an answer. ?History of Schizophrenia/Schizoaffective disorder:Yes ? ?Duration of Psychotic Symptoms:Greater than six months ? ?Hallucinations:Hallucinations: None ? ?Ideas of Reference:-- (could not assess) ? ?Suicidal Thoughts:Suicidal Thoughts: No ? ?Homicidal  Thoughts:Homicidal Thoughts: No ? ? ?Sensorium  ?Memory:-- (could not assess) ? ?Judgment:-- (could not assess) ? ?Insight:-- (could not assess) ? ? ?Executive Functions  ?Concentration:Poor ? ?Attention Span:Poor

## 2022-01-24 NOTE — Progress Notes (Signed)
Not sure I believe that pt's hypernatremia resolved with just 1 liter of D5W. Sending new BMP with venipuncture on the side of body opposite her PIV site. ?

## 2022-01-24 NOTE — ED Notes (Signed)
Notified Dr. Margo Aye of patient's BP's trending softer on Cardizem infusion. Last BP 88/59 at this time. Awaiting response from MD at this time.  ?

## 2022-01-24 NOTE — Progress Notes (Signed)
?  Echocardiogram ?2D Echocardiogram has been performed. ? ?Kathy Brown ?01/24/2022, 3:41 PM ?

## 2022-01-24 NOTE — Consult Note (Addendum)
?Cardiology Consultation:  ? ?Patient ID: Kathy Brown ?MRN: [id number]; DOB: May 11, 1981 ? ?Admit date: 01/23/2022 ?Date of Consult: 01/24/2022 ? ?PCP:  Pcp, No ?  ?[facility] Providers ?Cardiologist:  Thurmon Fair, MD  ? ?Patient Profile:  ? ?Kathy Brown is a 41 y.o. female with a hx of persistent Afib and mental health challenges including schizophrenia, hx of psychosis, PTSD, and anxiety who is being seen 01/24/2022 for the evaluation of Afib at the request of Dr. hall. ? ?History of Present Illness:  ? ?[name] follows with Dr. Royann Shivers and felt to have permanent atrial fibrillation.  A-fib initially diagnosed in 2018 at the time of a dental extraction.  It was felt that her risks for anticoagulation likely outweighed her embolic risk and was therefore not anticoagulated.  She has been rate controlled on metoprolol and Cardizem.  Echocardiogram 08/2018 with an LVEF of 55 to 60% with no RWMA, and no significant valvular disease.  PCP started her on low-dose Lasix in August 2021 for some lower extremity edema and referred her back to cardiology.  She was last seen in clinic by Judy Pimple on 07/13/2020.  Unfortunately at that visit it was discovered that her facility was not administering her medications due to an outstanding balance.  Unclear how long patient had been without her cardiac medications. Cardiac medications were refilled and the mother stated she would pick them up. ? ?The patient presented to behavioral health urgent care 01/23/2022 voluntarily with a chief complaint of patient being aggressive towards her parents who she lives with.  She had not been eating or taking her medications.  She stated she needed help with medications.  Due to unsteady gait and falling backwards she was transported not emergently to [facility] ED for further evaluation and medical clearance for behavioral health admission. ? ?Work-up included AKI with creatinine 1.62 from a baseline of 0.93, hypernatremia of 155.   Hypernatremia suspected secondary to decreased p.o. intake and decreased urine output.  She was started on IV fluids.  EKG consistent with A-fib RVR.  She was started on Cardizem drip and cardiology was consulted.  She became hypotensive on Cardizem at 88/59 and this was discontinued. ? ?Telemetry with Afib with ventricular rates now in the 110s. Pt is asymptomatic and states she hasn't taken her medications in 1 month. It is unclear if she doesn't have refills or if she just didn't take them. She denies pain anywhere. She does not feel her RVR. Largely asymptomatic.  ? ?Past Medical History:  ?Diagnosis Date  ? A-fib (HCC)   ? Anxiety   ? Atrial fibrillation with RVR (HCC) 10/15/2017  ? Auditory hallucinations   ? Chronic back pain   ? Dysrhythmia   ? Atrial fibrillation  ? Paranoid schizophrenia (HCC)   ? Psychosis (HCC) 03/25/2014  ? Schizoaffective disorder (HCC) 03/25/2014  ? Schizophrenia (HCC)   ? Urinary incontinence   ? ? ?Past Surgical History:  ?Procedure Laterality Date  ? TOOTH EXTRACTION Bilateral 09/08/2018  ? Procedure: DENTAL RESTORATION/EXTRACTIONS;  Surgeon: Ocie Doyne, DDS;  Location: [facility];  Service: Oral Surgery;  Laterality: Bilateral;  ?  ? ?Home Medications:  ?Prior to Admission medications   ?Medication Sig Start Date End Date Taking? Authorizing Provider  ?benztropine (COGENTIN) 0.5 MG tablet Take 1 tablet (0.5 mg total) by mouth 2 (two) times daily as needed for tremors (Extraparametal side effects). ?Patient not taking: Reported on 01/24/2022 11/22/21   Estella Husk, MD  ?diltiazem [facility] CD) 120  MG 24 hr capsule Take 1 capsule (120 mg total) by mouth daily. ?Patient not taking: Reported on 01/24/2022 01/15/21   Laveda AbbeParks, Laurie Britton, NP  ?hydrOXYzine (ATARAX) 25 MG tablet Take 1 tablet (25 mg total) by mouth 3 (three) times daily as needed for anxiety. ?Patient not taking: Reported on 01/24/2022 11/22/21   Estella HuskLaubach, Katherine S, MD  ?risperiDONE (RISPERDAL) 1 MG  tablet Take 1 tablet (1 mg total) by mouth 2 (two) times daily. ?Patient not taking: Reported on 01/24/2022 11/22/21 01/24/22  Estella HuskLaubach, Katherine S, MD  ?traZODone (DESYREL) 50 MG tablet Take 1 tablet (50 mg total) by mouth at bedtime as needed for sleep. ?Patient not taking: Reported on 01/24/2022 11/22/21   Estella HuskLaubach, Katherine S, MD  ? ? ?Inpatient Medications: ?Scheduled Meds: ? aspirin EC  81 mg Oral Daily  ? bisacodyl  10 mg Oral Daily  ? enoxaparin (LOVENOX) injection  40 mg Subcutaneous Q24H  ? polyethylene glycol  34 g Oral Once  ? potassium chloride  40 mEq Oral BID  ? risperiDONE  1 mg Oral BID  ? ?Continuous Infusions: ? ?PRN Meds: ?acetaminophen, benztropine, hydrOXYzine, ondansetron (ZOFRAN) IV, traZODone ? ?Allergies:   No Known Allergies ? ?Social History:   ?Social History  ? ?Socioeconomic History  ? Marital status: Single  ?  Spouse name: Not on file  ? Number of children: Not on file  ? Years of education: Not on file  ? Highest education level: Not on file  ?Occupational History  ? Not on file  ?Tobacco Use  ? Smoking status: Never  ? Smokeless tobacco: Never  ?Vaping Use  ? Vaping Use: Never used  ?Substance and Sexual Activity  ? Alcohol use: No  ? Drug use: No  ? Sexual activity: Not Currently  ?  Birth control/protection: None  ?Other Topics Concern  ? Not on file  ?Social History Narrative  ? Lives mom, dad, grandmother, and three kids  ? ?Social Determinants of Health  ? ?Financial Resource Strain: Not on file  ?Food Insecurity: Not on file  ?Transportation Needs: Not on file  ?Physical Activity: Not on file  ?Stress: Not on file  ?Social Connections: Not on file  ?Intimate Partner Violence: Not on file  ?  ?Family History:   ? ?Family History  ?Problem Relation Age of Onset  ? Hypertension Mother   ? Diabetes Mother   ?  ? ?ROS:  ?Please see the history of present illness.  ? ?All other ROS reviewed and negative.    ? ?Physical Exam/Data:  ? ?Vitals:  ? 01/24/22 1434 01/24/22 1446 01/24/22 1504  01/24/22 1657  ?BP: 94/63  102/71 94/76  ?Pulse: (!) 115  85 62  ?Resp: (!) 21 18 20 20   ?Temp: 97.6 ?F (36.4 ?C)   97.6 ?F (36.4 ?C)  ?TempSrc: Oral   Oral  ?SpO2: 98%  96% 100%  ?Weight:      ?Height:      ? ? ?Intake/Output Summary (Last 24 hours) at 01/24/2022 1948 ?Last data filed at 01/24/2022 [id number] ?Gross per 24 hour  ?Intake 1687.63 ml  ?Output --  ?Net 1687.63 ml  ? ? ?  01/23/2022  ?  9:13 PM 10/05/2021  ?  6:17 PM 10/05/2021  ?  6:16 PM  ?Last 3 Weights  ?Weight (lbs) 169 lb 15.6 oz 170 lb 224 lb 13.9 oz  ?Weight (kg) 77.1 kg 77.111 kg 102 kg  ?   ?Body mass index is 29.18 kg/m?.  ?General:  NAD ?HEENT: normal ?Neck: no JVD ?Vascular: No carotid bruits; Distal pulses 2+ bilaterally ?Cardiac:  iRRR ?Lungs:  clear to auscultation bilaterally, no wheezing, rhonchi or rales  ?Abd: soft, nontender, no hepatomegaly  ?Ext: no edema ?Musculoskeletal:  No deformities, BUE and BLE strength normal and equal ?Skin: warm and dry  ?Neuro:  CNs 2-12 intact, no focal abnormalities noted ?Psych:  Normal affect  ? ?EKG:  The EKG was personally reviewed and demonstrates:  Afib ventricular rate 118 ?Telemetry:  Telemetry was personally reviewed and demonstrates:  Afib with rates from 100-140s, now in the 110s ? ?Relevant CV Studies: ? ?Echo 01/24/22: ? 1. Left ventricular ejection fraction, by estimation, is 45 to 50%. The  ?left ventricle has mildly decreased function. The left ventricle  ?demonstrates global hypokinesis. Left ventricular diastolic function could  ?not be evaluated.  ? 2. Right ventricular systolic function is normal. The right ventricular  ?size is normal. There is normal pulmonary artery systolic pressure.  ? 3. The mitral valve is normal in structure. Trivial mitral valve  ?regurgitation. No evidence of mitral stenosis.  ? 4. The aortic valve is tricuspid. There is mild calcification of the  ?aortic valve. Aortic valve regurgitation is not visualized. No aortic  ?stenosis is present.  ? 5. The inferior vena  cava is normal in size with <50% respiratory  ?variability, suggesting right atrial pressure of 8 mmHg.  ? ?Laboratory Data: ? ?High Sensitivity Troponin:  No results for input(s): TROPONINIHS in the l

## 2022-01-24 NOTE — ED Notes (Signed)
Per Dr. Margo Aye, Cardizem d/c'd at this time.  ?

## 2022-01-24 NOTE — Progress Notes (Signed)
?   01/24/22 1504  ?Assess: MEWS Score  ?BP 102/71  ?Pulse Rate 85  ?ECG Heart Rate (!) 133  ?Resp 20  ?SpO2 96 %  ?Assess: MEWS Score  ?MEWS Temp 0  ?MEWS Systolic 0  ?MEWS Pulse 3  ?MEWS RR 0  ?MEWS LOC 0  ?MEWS Score 3  ?MEWS Score Color Yellow  ?Assess: if the MEWS score is Yellow or Red  ?Were vital signs taken at a resting state? Yes  ?Focused Assessment No change from prior assessment  ?Early Detection of Sepsis Score *See Row Information* Low  ?MEWS guidelines implemented *See Row Information* Yes  ?Notify: Charge Nurse/RN  ?Name of Charge Nurse/RN Notified Chrissy Rosana Hoes  ?Date Charge Nurse/RN Notified 01/24/22  ?Time Charge Nurse/RN Notified 1522  ?Document  ?Patient Outcome Stabilized after interventions  ?Progress note created (see row info) Yes  ? ? ?

## 2022-01-24 NOTE — ED Notes (Signed)
Date and time results received: 01/24/22  ? ? ?Test: osmolality ?Critical Value: 321 ? ?Name of Provider Notified: Carollee Herter ? ?

## 2022-01-24 NOTE — ED Notes (Signed)
Kathy Ferretti, MD at bedside when Ativan was administered for assessment of catatonia. ?

## 2022-01-24 NOTE — Progress Notes (Signed)
?   01/24/22 2000  ?Assess: MEWS Score  ?Temp 97.8 ?F (36.6 ?C)  ?BP 96/79  ?Pulse Rate 72  ?ECG Heart Rate (!) 139  ?Resp 18  ?SpO2 100 %  ?O2 Device Room Air  ?Assess: MEWS Score  ?MEWS Temp 0  ?MEWS Systolic 1  ?MEWS Pulse 3  ?MEWS RR 0  ?MEWS LOC 0  ?MEWS Score 4  ?MEWS Score Color Red  ?Assess: if the MEWS score is Yellow or Red  ?Were vital signs taken at a resting state? Yes  ?Focused Assessment No change from prior assessment  ?Early Detection of Sepsis Score *See Row Information* Low  ?MEWS guidelines implemented *See Row Information* Yes  ?Treat  ?MEWS Interventions Escalated (See documentation below)  ?Pain Scale 0-10  ?Pain Score 0  ?Take Vital Signs  ?Increase Vital Sign Frequency  Red: Q 1hr X 4 then Q 4hr X 4, if remains red, continue Q 4hrs  ?Escalate  ?MEWS: Escalate Red: discuss with charge nurse/RN and provider, consider discussing with RRT  ?Notify: Charge Nurse/RN  ?Name of Charge Nurse/RN Notified [place] RN  ?Date Charge Nurse/RN Notified 01/24/22  ?Time Charge Nurse/RN Notified 2025  ?Document  ?Patient Outcome Not stable and remains on department  ?Progress note created (see row info) Yes  ? ? ?

## 2022-01-25 DIAGNOSIS — E870 Hyperosmolality and hypernatremia: Secondary | ICD-10-CM | POA: Diagnosis not present

## 2022-01-25 LAB — RAPID URINE DRUG SCREEN, HOSP PERFORMED
Amphetamines: NOT DETECTED
Barbiturates: NOT DETECTED
Benzodiazepines: NOT DETECTED
Cocaine: NOT DETECTED
Opiates: NOT DETECTED
Tetrahydrocannabinol: NOT DETECTED

## 2022-01-25 LAB — CBC
HCT: 36.2 % (ref 36.0–46.0)
Hemoglobin: 11.4 g/dL — ABNORMAL LOW (ref 12.0–15.0)
MCH: 27.9 pg (ref 26.0–34.0)
MCHC: 31.5 g/dL (ref 30.0–36.0)
MCV: 88.7 fL (ref 80.0–100.0)
Platelets: 111 10*3/uL — ABNORMAL LOW (ref 150–400)
RBC: 4.08 MIL/uL (ref 3.87–5.11)
RDW: 16.1 % — ABNORMAL HIGH (ref 11.5–15.5)
WBC: 5.8 10*3/uL (ref 4.0–10.5)
nRBC: 0 % (ref 0.0–0.2)

## 2022-01-25 LAB — LIPID PANEL
Cholesterol: 215 mg/dL — ABNORMAL HIGH (ref 0–200)
HDL: 26 mg/dL — ABNORMAL LOW (ref >40)
LDL Cholesterol: 167 mg/dL — ABNORMAL HIGH (ref 0–99)
Total CHOL/HDL Ratio: 8.3 RATIO
Triglycerides: 112 mg/dL (ref <150)
VLDL: 22 mg/dL (ref 0–40)

## 2022-01-25 LAB — COMPREHENSIVE METABOLIC PANEL
ALT: 13 U/L (ref 0–44)
AST: 14 U/L — ABNORMAL LOW (ref 15–41)
Albumin: 3.1 g/dL — ABNORMAL LOW (ref 3.5–5.0)
Alkaline Phosphatase: 31 U/L — ABNORMAL LOW (ref 38–126)
Anion gap: 4 — ABNORMAL LOW (ref 5–15)
BUN: 23 mg/dL — ABNORMAL HIGH (ref 6–20)
CO2: 28 mmol/L (ref 22–32)
Calcium: 8.8 mg/dL — ABNORMAL LOW (ref 8.9–10.3)
Chloride: 107 mmol/L (ref 98–111)
Creatinine, Ser: 1.02 mg/dL — ABNORMAL HIGH (ref 0.44–1.00)
GFR, Estimated: >60 mL/min (ref >60)
Glucose, Bld: 114 mg/dL — ABNORMAL HIGH (ref 70–99)
Potassium: 3.6 mmol/L (ref 3.5–5.1)
Sodium: 139 mmol/L (ref 135–145)
Total Bilirubin: 0.8 mg/dL (ref 0.3–1.2)
Total Protein: 6 g/dL — ABNORMAL LOW (ref 6.5–8.1)

## 2022-01-25 LAB — OSMOLALITY, URINE: Osmolality, Ur: 122 mOsm/kg — ABNORMAL LOW (ref 300–900)

## 2022-01-25 MED ORDER — METOPROLOL TARTRATE 5 MG/5ML IV SOLN
2.5000 mg | INTRAVENOUS | Status: AC
  Administered 2022-01-25: 2.5 mg via INTRAVENOUS
  Filled 2022-01-25: qty 5

## 2022-01-25 MED ORDER — DIGOXIN 0.25 MG/ML IJ SOLN
0.5000 mg | Freq: Once | INTRAMUSCULAR | Status: AC
  Administered 2022-01-25: 0.5 mg via INTRAVENOUS
  Filled 2022-01-25: qty 2

## 2022-01-25 MED ORDER — ATORVASTATIN CALCIUM 40 MG PO TABS
40.0000 mg | ORAL_TABLET | Freq: Every day | ORAL | Status: DC
Start: 2022-01-25 — End: 2022-01-31
  Administered 2022-01-25 – 2022-01-31 (×7): 40 mg via ORAL
  Filled 2022-01-25 (×7): qty 1

## 2022-01-25 MED ORDER — SODIUM CHLORIDE 0.9 % IV BOLUS
250.0000 mL | INTRAVENOUS | Status: AC
  Administered 2022-01-25: 250 mL via INTRAVENOUS

## 2022-01-25 MED ORDER — DIGOXIN 250 MCG PO TABS
0.2500 mg | ORAL_TABLET | Freq: Every day | ORAL | Status: DC
  Administered 2022-01-26 – 2022-01-31 (×6): 0.25 mg via ORAL
  Filled 2022-01-25 (×6): qty 1

## 2022-01-25 MED ORDER — DIGOXIN 0.25 MG/ML IJ SOLN
0.2500 mg | Freq: Four times a day (QID) | INTRAMUSCULAR | Status: AC
  Administered 2022-01-25 – 2022-01-26 (×2): 0.25 mg via INTRAVENOUS
  Filled 2022-01-25 (×2): qty 1

## 2022-01-25 NOTE — Plan of Care (Signed)
  Problem: Nutrition: Goal: Adequate nutrition will be maintained Outcome: Progressing   

## 2022-01-25 NOTE — Consult Note (Signed)
[facility] Face-to-Face Psychiatry Consult  ? ?Reason for Consult:  Paranoid schizophrenia  ?Referring Physician:  Ulyess Mort ?Patient Identification: Kathy Brown ?MRN:  [id number] ?Principal Diagnosis: Hypernatremia ?Diagnosis:  Principal Problem: ?  Hypernatremia ?Active Problems: ?  Paranoid schizophrenia (HCC) ?  A-fib (HCC) ?  AKI (acute kidney injury) (HCC) ?  Rapid atrial fibrillation (HCC) ? ? ?Total Time spent with patient: 15 minutes ? ?Subjective:   ?Dolores Brown is a 41 y.o. female was seen and evaluated face-to-face.  She is awake, alert oriented to self and place only.  Safety sitter at bedside.  she continues to endorse auditory and visual hallucinations. Denied command hallucinations.  Patient can be difficult to understand throughout this assessment.  Mild confusion noted.  ? ?Shannen stated that she takes medications daily, however is unable to recall home meds at this time.   ? ?Chart reviewed.  Patient has a history with schizophrenia and was  restarted on Risperdal 1 mg p.o. twice daily.  She reports taking and tolerating medications well. Denying  medication side effects.  RN reports patient has been taking medication by mouth. Psychiatry to continue to follow.  Support,  encouragement and reassurance was provided.  ? ?HPI:" Kathy Brown is a 41 y.o. female admitted medically for 01/23/2022 for agitation/aggression towards family and not taking her medications. She carries the psychiatric diagnoses of paranoid schizophrenia and has a past medical history of  intellectual disability, atrial fibrillation. Psychiatry was consulted for Schizophrenia and medication management by Carollee Herter MD."  ? ?Past Psychiatric History:  ? ?Risk to Self:   ?Risk to Others:   ?Prior Inpatient Therapy:   ?Prior Outpatient Therapy:   ? ?Past Medical History:  ?Past Medical History:  ?Diagnosis Date  ? A-fib (HCC)   ? Anxiety   ? Atrial fibrillation with RVR (HCC) 10/15/2017  ? Auditory hallucinations   ? Chronic  back pain   ? Dysrhythmia   ? Atrial fibrillation  ? Paranoid schizophrenia (HCC)   ? Psychosis (HCC) 03/25/2014  ? Schizoaffective disorder (HCC) 03/25/2014  ? Schizophrenia (HCC)   ? Urinary incontinence   ?  ?Past Surgical History:  ?Procedure Laterality Date  ? TOOTH EXTRACTION Bilateral 09/08/2018  ? Procedure: DENTAL RESTORATION/EXTRACTIONS;  Surgeon: Ocie Doyne, DDS;  Location: [facility];  Service: Oral Surgery;  Laterality: Bilateral;  ? ?Family History:  ?Family History  ?Problem Relation Age of Onset  ? Hypertension Mother   ? Diabetes Mother   ? ?Family Psychiatric  History:  ?Social History:  ?Social History  ? ?Substance and Sexual Activity  ?Alcohol Use No  ?   ?Social History  ? ?Substance and Sexual Activity  ?Drug Use No  ?  ?Social History  ? ?Socioeconomic History  ? Marital status: Single  ?  Spouse name: Not on file  ? Number of children: Not on file  ? Years of education: Not on file  ? Highest education level: Not on file  ?Occupational History  ? Not on file  ?Tobacco Use  ? Smoking status: Never  ? Smokeless tobacco: Never  ?Vaping Use  ? Vaping Use: Never used  ?Substance and Sexual Activity  ? Alcohol use: No  ? Drug use: No  ? Sexual activity: Not Currently  ?  Birth control/protection: None  ?Other Topics Concern  ? Not on file  ?Social History Narrative  ? Lives mom, dad, grandmother, and three kids  ? ?Social Determinants of Health  ? ?Financial Resource Strain: Not on file  ?  Food Insecurity: Not on file  ?Transportation Needs: Not on file  ?Physical Activity: Not on file  ?Stress: Not on file  ?Social Connections: Not on file  ? ?Additional Social History: ?  ? ?Allergies:  No Known Allergies ? ?Labs:  ?Results for orders placed or performed during the hospital encounter of 01/23/22 (from the past 48 hour(s))  ?Comprehensive metabolic panel     Status: Abnormal  ? Collection Time: 01/23/22  8:25 PM  ?Result Value Ref Range  ? Sodium 155 (H) 135 - 145 mmol/L  ?  Potassium 3.9 3.5 - 5.1 mmol/L  ? Chloride 119 (H) 98 - 111 mmol/L  ? CO2 26 22 - 32 mmol/L  ? Glucose, Bld 121 (H) 70 - 99 mg/dL  ?  Comment: Glucose reference range applies only to samples taken after fasting for at least 8 hours.  ? BUN 46 (H) 6 - 20 mg/dL  ? Creatinine, Ser 1.62 (H) 0.44 - 1.00 mg/dL  ? Calcium 10.9 (H) 8.9 - 10.3 mg/dL  ? Total Protein 8.7 (H) 6.5 - 8.1 g/dL  ? Albumin 4.9 3.5 - 5.0 g/dL  ? AST 26 15 - 41 U/L  ? ALT 17 0 - 44 U/L  ? Alkaline Phosphatase 34 (L) 38 - 126 U/L  ? Total Bilirubin 1.3 (H) 0.3 - 1.2 mg/dL  ? GFR, Estimated 41 (L) >60 mL/min  ?  Comment: (NOTE) ?Calculated using the CKD-EPI Creatinine Equation (2021) ?  ? Anion gap 10 5 - 15  ?  Comment: Performed at [facility], [address] [place], [place] [address]  ?Ethanol     Status: None  ? Collection Time: 01/23/22  8:25 PM  ?Result Value Ref Range  ? Alcohol, Ethyl (B) <10 <10 mg/dL  ?  Comment: (NOTE) ?Lowest detectable limit for serum alcohol is 10 mg/dL. ? ?For medical purposes only. ?Performed at [facility], [address] [place], [place] [address] ?  ?cbc     Status: Abnormal  ? Collection Time: 01/23/22  8:25 PM  ?Result Value Ref Range  ? WBC 6.5 4.0 - 10.5 K/uL  ? RBC 5.30 (H) 3.87 - 5.11 MIL/uL  ? Hemoglobin 14.7 12.0 - 15.0 g/dL  ? HCT 48.0 (H) 36.0 - 46.0 %  ? MCV 90.6 80.0 - 100.0 fL  ? MCH 27.7 26.0 - 34.0 pg  ? MCHC 30.6 30.0 - 36.0 g/dL  ? RDW 17.9 (H) 11.5 - 15.5 %  ? Platelets 227 150 - 400 K/uL  ?  Comment: REPEATED TO VERIFY  ? nRBC 0.0 0.0 - 0.2 %  ?  Comment: Performed at [facility], [address] [place], [place] [address]  ?Acetaminophen level     Status: Abnormal  ? Collection Time: 01/23/22  8:25 PM  ?Result Value Ref Range  ? Acetaminophen (Tylenol), Serum <10 (L) 10 - 30 ug/mL  ?  Comment: (NOTE) ?Therapeutic concentrations vary significantly. A range of 10-30 ug/mL  ?may be an effective concentration for many patients. However, some  ?are best treated at  concentrations outside of this range. ?Acetaminophen concentrations >150 ug/mL at 4 hours after ingestion  ?and >50 ug/mL at 12 hours after ingestion are often associated with  ?toxic reactions. ? ?Performed at [facility], [address] [place], [place] [address] ?  ?Salicylate level     Status: Abnormal  ? Collection Time: 01/23/22  8:25 PM  ?Result Value Ref Range  ? Salicylate  Lvl <7.0 (L) 7.0 - 30.0 mg/dL  ?  Comment: Performed at [facility], [address], [place], [place] [address]  ?I-Stat beta hCG blood, ED     Status: None  ? Collection Time: 01/23/22  9:10 PM  ?Result Value Ref Range  ? I-stat hCG, quantitative <5.0 <5 mIU/mL  ? Comment 3          ?  Comment:   GEST. AGE      CONC.  (mIU/mL) ?  <=1 WEEK        5 - 50 ?    2 WEEKS       50 - 500 ?    3 WEEKS       100 - 10,000 ?    4 WEEKS     1,000 - 30,000 ?       ?FEMALE AND NON-PREGNANT FEMALE: ?    LESS THAN 5 mIU/mL ?  ?TSH     Status: None  ? Collection Time: 01/24/22  5:11 AM  ?Result Value Ref Range  ? TSH 0.584 0.350 - 4.500 uIU/mL  ?  Comment: Performed by a 3rd Generation assay with a functional sensitivity of <=0.01 uIU/mL. ?Performed at [facility], [address], [place], [place] [address] ?  ?Osmolality     Status: Abnormal  ? Collection Time: 01/24/22  5:11 AM  ?Result Value Ref Range  ? Osmolality 321 (HH) 275 - 295 mOsm/kg  ?  Comment: REPEATED TO VERIFY ?CRITICAL RESULT CALLED TO, READ BACK BY AND VERIFIED WITH: ?LEBRON,Y RN 01/24/2022 0549 SKEEN,P ?Performed at [facility], [address], [place], [place] [address] ?  ?Comprehensive metabolic panel     Status: Abnormal  ? Collection Time: 01/24/22  5:11 AM  ?Result Value Ref Range  ? Sodium 142 135 - 145 mmol/L  ?  Comment: DELTA CHECK NOTED  ? Potassium 3.2 (L) 3.5 - 5.1 mmol/L  ?  Comment: DELTA CHECK NOTED  ? Chloride 109 98 - 111 mmol/L  ? CO2 28 22 - 32 mmol/L  ? Glucose, Bld 165 (H) 70 - 99 mg/dL  ?  Comment: Glucose reference range applies only  to samples taken after fasting for at least 8 hours.  ? BUN 42 (H) 6 - 20 mg/dL  ? Creatinine, Ser 0.96 0.44 - 1.00 mg/dL  ? Calcium 9.1 8.9 - 10.3 mg/dL  ? Total Protein 6.4 (L) 6.5 - 8.1 g/dL  ? Albumin 3.6 3.5 - 5.0 g/dL

## 2022-01-25 NOTE — Progress Notes (Signed)
? ? ?PROGRESS NOTE ? ?Kathy Brown [id number] DOB: 07/16/1981 DOA: 01/23/2022 ?PCP: Pcp, No ? ?HPI/Recap of past 33 hours: ?41 year old African-American female with a history of paranoid schizophrenia, intellectual disability, permanent atrial fibrillation not on oral anticoagulation, history of aggressive behavior who presents with [facility] [employer] from behavioral health urgent care.  Reportedly patient has been aggressive toward her parents and not taking her medications.  Labs significant for hypernatremia with serum sodium of 155, hypovolemic.  Acute kidney injury likely prerenal from dehydration.  Admitted by hospitalist service, [place].  Psychiatry following. ?  ?Additional history copied from [place] intake. ?  ?"the patient stopped eating, drinking, and bathing 2 weeks ago.  She states that the patient has been exhibiting outbursts that she describes as the patient crying, screaming, and talking to herself as if multiple people are present almost every night between 2 AM to 6 AM. She states that the patient has been peeing on herself because she is afraid to go to the bathroom for the past 2 weeks. She states that she does not believe that the patient has had a bowel movement in 2 weeks. She states that the patient loss about 100 pounds within the past couple months and 15 pounds in the past 2 weeks. Kathy Brown states that the patient was here in January and did good for 2 weeks after she was discharged. She states that the patient stopped taking her medications. Patient resides with her mother Kathy Brown and her three children. "  ?  ?01/25/2022: Patient was seen and examined at bedside this morning.  She is more alert and interactive.  She has no new complaints. ? ?Assessment/Plan: ?Principal Problem: ?  Hypernatremia ?Active Problems: ?  AKI (acute kidney injury) ([place]) ?  Paranoid schizophrenia ([place]) ?  A-fib ([place]) ?  Rapid atrial fibrillation ([place]) ? ?Resolved hypovolemic  hypernatremia ?Presented with serum sodium of 155, hypovolemic on exam, received D5W at 100 cc/h. ?Hypernatremia has resolved.  Stopped IV fluid this morning. ?Patient denies any nausea or abdominal pain, encourage oral fluid intake to avoid dehydration. ?  ?Resolved post IV fluid hydration: Prerenal AKI (acute kidney injury) ([place]) ?Continue to monitor toxic agents, dehydration and hypotension. ?  ?Permanent A-fib ([place]) with RVR, persistent. ?Pt is non-compliant with her psych meds. Therefore, not a good candidate for long term systemic anticoagulation. Also with her hx of violence/acting out physically, she has an increased risk of bleeding. [place] VASC score of 1. Could recommend taking ASA 81 mg daily if pt will be compliant with taking her psych meds.  Keep potassium >4 and Mg >2.0. was suppose to be on cardizem CD 120 mg daily per [facility].  ?Initially on Cardizem drip on admission, stopped after RVR resolved.  She is back in A-fib with RVR. ?Last 2D echo done in 2019 with LVEF 55 to 60% with no regional wall motion abnormalities. ?Repeat 2D echo on 01/24/2022 showed LVEF 45 to 50%, global hypokinesis.  Cardiology consulted, management per cardiology ?  ?Paranoid schizophrenia ([place]) ?Psych following ?Management per psych ?  ?Resolved acute metabolic encephalopathy, suspect multifactorial ?Contributed by hypernatremia, underlying psych issues ?Continue to treat underlying conditions ?Continue to reorient as needed ?One-to-one sitter in place for patient's own safety ?  ?  ?DVT prophylaxis: Subcu Lovenox daily ?Code Status: Full Code by default ?Family Communication: no family at bedside. Attempted to contact pt's mother Kathy Brown x 2 without success.  ?Disposition Plan: return home vs admit to [facility] after discharge  ?Consults  called: Psychiatry ?Admission status: Inpatient. ?  ?  ? ? ? ?Status is: Inpatient.  Patient requires at least 2 midnights for further evaluation and treatment of present  condition. ? ? ? ? ?Objective: ?Vitals:  ? 01/24/22 2300 01/25/22 0000 01/25/22 0400 01/25/22 0800  ?BP: 96/60 94/61 98/61  94/66  ?Pulse: (!) 110 90 (!) 58 83  ?Resp: 18 20 18 16   ?Temp: 98 ?F (36.7 ?C) 97.8 ?F (36.6 ?C) 98.6 ?F (37 ?C) 97.7 ?F (36.5 ?C)  ?TempSrc: Oral Oral Oral Oral  ?SpO2: 97% 100% 98% 99%  ?Weight:      ?Height:      ? ? ?Intake/Output Summary (Last 24 hours) at 01/25/2022 0948 ?Last data filed at 01/25/2022 0936 ?Gross per 24 hour  ?Intake 997.21 ml  ?Output 600 ml  ?Net 397.21 ml  ? ?Filed Weights  ? 01/23/22 2113  ?Weight: 77.1 kg  ? ? ?Exam: ? ?General: 41 y.o. year-old female well-developed well-nourished in no distress.  She is alert and oriented x3.   ?Cardiovascular: Irregular rate and rhythm no rubs or gallops. ?Respiratory: Clear to auscultation no wheezes or rales.   ?Abdomen: Soft with normal bowel sounds present. ?Musculoskeletal: No lower extremity edema bilaterally. ?Skin: No ulcerative lesions seen. ?Psychiatry: Mood is appropriate for condition and setting. ? ? ?Data Reviewed: ?CBC: ?Recent Labs  ?Lab 01/23/22 ?2025 01/24/22 ?1406 01/25/22 ?0310  ?WBC 6.5  --  5.8  ?HGB 14.7 11.2* 11.4*  ?HCT 48.0* 33.0* 36.2  ?MCV 90.6  --  88.7  ?PLT 227  --  111*  ? ?Basic Metabolic Panel: ?Recent Labs  ?Lab 01/23/22 ?2025 01/24/22 ?[id number] 01/24/22 ?[id number] 01/24/22 ?1406 01/25/22 ?0310  ?NA 155* 142 141 139 139  ?K 3.9 3.2* 3.3* 3.4* 3.6  ?CL 119* 109 107  --  107  ?CO2 26 28 27   --  28  ?GLUCOSE 121* 165* 196*  --  114*  ?BUN 46* 42* 42*  --  23*  ?CREATININE 1.62* 0.96 1.13*  --  1.02*  ?CALCIUM 10.9* 9.1 9.3  --  8.8*  ?MG  --   --  2.3  --   --   ? ?GFR: ?Estimated Creatinine Clearance: 75.2 mL/min (A) (by C-G formula based on SCr of 1.02 mg/dL (H)). ?Liver Function Tests: ?Recent Labs  ?Lab 01/23/22 ?2025 01/24/22 ?[id number] 01/25/22 ?0310  ?AST 26 19 14*  ?ALT 17 15 13   ?ALKPHOS 34* 26* 31*  ?BILITOT 1.3* 1.4* 0.8  ?PROT 8.7* 6.4* 6.0*  ?ALBUMIN 4.9 3.6 3.1*  ? ?No results for input(s): LIPASE,  AMYLASE in the last 168 hours. ?No results for input(s): AMMONIA in the last 168 hours. ?Coagulation Profile: ?No results for input(s): INR, PROTIME in the last 168 hours. ?Cardiac Enzymes: ?No results for input(s): CKTOTAL, CKMB, CKMBINDEX, TROPONINI in the last 168 hours. ?BNP (last 3 results) ?No results for input(s): PROBNP in the last 8760 hours. ?HbA1C: ?No results for input(s): HGBA1C in the last 72 hours. ?CBG: ?No results for input(s): GLUCAP in the last 168 hours. ?Lipid Profile: ?Recent Labs  ?  01/25/22 ?0310  ?CHOL 215*  ?HDL 26*  ?LDLCALC 167*  ?TRIG 112  ?CHOLHDL 8.3  ? ?Thyroid Function Tests: ?Recent Labs  ?  01/24/22 ?[id number]  ?TSH 0.584  ? ?Anemia Panel: ?No results for input(s): VITAMINB12, FOLATE, FERRITIN, TIBC, IRON, RETICCTPCT in the last 72 hours. ?Urine analysis: ?   ?Component Value Date/Time  ? COLORURINE STRAW (A) 01/24/2022 2324  ? APPEARANCEUR CLEAR 01/24/2022 2324  ?  LABSPEC 1.003 (L) 01/24/2022 2324  ? PHURINE 6.0 01/24/2022 2324  ? GLUCOSEU NEGATIVE 01/24/2022 2324  ? [place] NEGATIVE 01/24/2022 2324  ? [place] NEGATIVE 01/24/2022 2324  ? [place] NEGATIVE 01/24/2022 2324  ? [place] NEGATIVE 01/24/2022 2324  ? UROBILINOGEN 0.2 09/20/2014 1135  ? NITRITE NEGATIVE 01/24/2022 2324  ? LEUKOCYTESUR NEGATIVE 01/24/2022 2324  ? ?Sepsis Labs: ?@LABRCNTIP (procalcitonin:4,lacticidven:4) ? ?)No results found for this or any previous visit (from the past 240 hour(s)).  ? ? ?Studies: ?ECHOCARDIOGRAM COMPLETE ? ?Result Date: 01/24/2022 ?   ECHOCARDIOGRAM REPORT   Patient Name:   Kathy Brown Date of Exam: 01/24/2022 Medical Rec #:  [id number]       Height:       64.0 in Accession #:    [id number]      Weight:       170.0 lb Date of Birth:  05/01/1981       BSA:          1.826 m? Patient Age:    44 years        BP:           91/59 mmHg Patient Gender: F               HR:           99 bpm. Exam Location:  Inpatient Procedure: 2D Echo, Cardiac Doppler and Color Doppler Indications:    Atrial  fibrillation  History:        Patient has prior history of Echocardiogram examinations, most                 recent 09/09/2018. Arrythmias:Atrial Fibrillation. Acute kidney                 injury.  Sonographer:    Clayton Lefort RDCS (AE) Referring Phys: [id number]

## 2022-01-25 NOTE — Progress Notes (Signed)
? ?Progress Note ? ?Patient Name: Chrystelle Bagan ?Date of Encounter: 01/25/2022 ? ?[facility] Cardiologist: Sanda Klein, MD  ? ?Subjective  ? ?Patient denies palpitations.  No shortness of breath.  No dizziness no chest pain ? ?Inpatient Medications  ?  ?Scheduled Meds: ? aspirin EC  81 mg Oral Daily  ? atorvastatin  40 mg Oral Daily  ? enoxaparin (LOVENOX) injection  40 mg Subcutaneous Q24H  ? metoprolol tartrate  2.5 mg Intravenous STAT  ? polyethylene glycol  34 g Oral Once  ? risperiDONE  1 mg Oral BID  ? ?Continuous Infusions: ? sodium chloride    ? ?PRN Meds: ?acetaminophen, benztropine, hydrOXYzine, ondansetron (ZOFRAN) IV, traZODone  ? ?Vital Signs  ?  ?Vitals:  ? 01/24/22 2300 01/25/22 0000 01/25/22 0400 01/25/22 0800  ?BP: 96/60 94/61 98/61  94/66  ?Pulse: (!) 110 90 (!) 58 83  ?Resp: 18 20 18 16   ?Temp: 98 ?F (36.7 ?C) 97.8 ?F (36.6 ?C) 98.6 ?F (37 ?C) 97.7 ?F (36.5 ?C)  ?TempSrc: Oral Oral Oral Oral  ?SpO2: 97% 100% 98% 99%  ?Weight:      ?Height:      ? ? ?Intake/Output Summary (Last 24 hours) at 01/25/2022 1056 ?Last data filed at 01/25/2022 0936 ?Gross per 24 hour  ?Intake 997.21 ml  ?Output 600 ml  ?Net 397.21 ml  ? ? ?  01/23/2022  ?  9:13 PM 10/05/2021  ?  6:17 PM 10/05/2021  ?  6:16 PM  ?Last 3 Weights  ?Weight (lbs) 169 lb 15.6 oz 170 lb 224 lb 13.9 oz  ?Weight (kg) 77.1 kg 77.111 kg 102 kg  ?   ? ?Telemetry  ?  ?Atrial fibrillation.  Rates 100s to 150s.- Personally Reviewed ? ?ECG  ?  ?No new.- Personally Reviewed ? ?Physical Exam  ? ?GEN: No acute distress.   ?Neck: No JVD ?Cardiac: Irregularly irregular.  S1, S2.  No murmurs. ?Respiratory: Clear to auscultation bilaterally. ?GI: Soft, nontender, non-distended  ?MS: No edema; No deformity. ?Neuro: Deferred ?Psych: Deferred ?Labs  ?  ?High Sensitivity Troponin:  No results for input(s): TROPONINIHS in the last 720 hours.   ?Chemistry ?Recent Labs  ?Lab 01/23/22 ?2025 01/24/22 ?[id number] 01/24/22 ?[id number] 01/24/22 ?1406 01/25/22 ?0310  ?NA 155* 142 141  139 139  ?K 3.9 3.2* 3.3* 3.4* 3.6  ?CL 119* 109 107  --  107  ?CO2 26 28 27   --  28  ?GLUCOSE 121* 165* 196*  --  114*  ?BUN 46* 42* 42*  --  23*  ?CREATININE 1.62* 0.96 1.13*  --  1.02*  ?CALCIUM 10.9* 9.1 9.3  --  8.8*  ?MG  --   --  2.3  --   --   ?PROT 8.7* 6.4*  --   --  6.0*  ?ALBUMIN 4.9 3.6  --   --  3.1*  ?AST 26 19  --   --  14*  ?ALT 17 15  --   --  13  ?ALKPHOS 34* 26*  --   --  31*  ?BILITOT 1.3* 1.4*  --   --  0.8  ?GFRNONAA 41* >60 >60  --  >60  ?ANIONGAP 10 5 7   --  4*  ?  ?Lipids  ?Recent Labs  ?Lab 01/25/22 ?0310  ?CHOL 215*  ?TRIG 112  ?HDL 26*  ?LDLCALC 167*  ?CHOLHDL 8.3  ?  ?Hematology ?Recent Labs  ?Lab 01/23/22 ?2025 01/24/22 ?1406 01/25/22 ?0310  ?WBC 6.5  --  5.8  ?  RBC 5.30*  --  4.08  ?HGB 14.7 11.2* 11.4*  ?HCT 48.0* 33.0* 36.2  ?MCV 90.6  --  88.7  ?MCH 27.7  --  27.9  ?MCHC 30.6  --  31.5  ?RDW 17.9*  --  16.1*  ?PLT 227  --  111*  ? ?Thyroid  ?Recent Labs  ?Lab 01/24/22 ?[id number]  ?TSH 0.584  ?  ?BNPNo results for input(s): BNP, PROBNP in the last 168 hours.  ?DDimer No results for input(s): DDIMER in the last 168 hours.  ? ?Radiology  ?  ?ECHOCARDIOGRAM COMPLETE ? ?Result Date: 01/24/2022 ?   ECHOCARDIOGRAM REPORT   Patient Name:   ARRIETTY GRAMLEY Date of Exam: 01/24/2022 Medical Rec #:  [id number]       Height:       64.0 in Accession #:    [id number]      Weight:       170.0 lb Date of Birth:  [birth year]       BSA:          1.826 m? Patient Age:    85 years        BP:           91/59 mmHg Patient Gender: F               HR:           99 bpm. Exam Location:  Inpatient Procedure: 2D Echo, Cardiac Doppler and Color Doppler Indications:    Atrial fibrillation  History:        Patient has prior history of Echocardiogram examinations, most                 recent 09/09/2018. Arrythmias:Atrial Fibrillation. Acute kidney                 injury.  Sonographer:    Clayton Lefort RDCS (AE) Referring Phys: [id number] ERIC CHEN  Sonographer Comments: Patient unable to complete IVC collapse test. IMPRESSIONS  1.  Left ventricular ejection fraction, by estimation, is 45 to 50%. The left ventricle has mildly decreased function. The left ventricle demonstrates global hypokinesis. Left ventricular diastolic function could not be evaluated.  2. Right ventricular systolic function is normal. The right ventricular size is normal. There is normal pulmonary artery systolic pressure.  3. The mitral valve is normal in structure. Trivial mitral valve regurgitation. No evidence of mitral stenosis.  4. The aortic valve is tricuspid. There is mild calcification of the aortic valve. Aortic valve regurgitation is not visualized. No aortic stenosis is present.  5. The inferior vena cava is normal in size with <50% respiratory variability, suggesting right atrial pressure of 8 mmHg. Comparison(s): Changes from prior study are noted. EF decreased from prior, but patient is in afib RVR throughout current study. FINDINGS  Left Ventricle: Left ventricular ejection fraction, by estimation, is 45 to 50%. The left ventricle has mildly decreased function. The left ventricle demonstrates global hypokinesis. The left ventricular internal cavity size was normal in size. There is  borderline left ventricular hypertrophy. Left ventricular diastolic function could not be evaluated due to atrial fibrillation. Left ventricular diastolic function could not be evaluated. Right Ventricle: The right ventricular size is normal. No increase in right ventricular wall thickness. Right ventricular systolic function is normal. There is normal pulmonary artery systolic pressure. The tricuspid regurgitant velocity is 2.15 m/s, and  with an assumed right atrial pressure of 8 mmHg, the estimated right ventricular systolic pressure is [id number] mmHg. Left Atrium: Left  atrial size was normal in size. Right Atrium: Right atrial size was normal in size. Pericardium: There is no evidence of pericardial effusion. Mitral Valve: The mitral valve is normal in structure. Trivial mitral  valve regurgitation. No evidence of mitral valve stenosis. Tricuspid Valve: The tricuspid valve is normal in structure. Tricuspid valve regurgitation is mild . No evidence of tricuspid stenosis. Aortic Valve: The aortic valve is tricuspid. There is mild calcification of the aortic valve. Aortic valve regurgitation is not visualized. No aortic stenosis is present. Aortic valve mean gradient measures 2.0 mmHg. Aortic valve peak gradient measures 3.9 mmHg. Aortic valve area, by VTI measures 2.37 cm?. Pulmonic Valve: The pulmonic valve was grossly normal. Pulmonic valve regurgitation is mild. No evidence of pulmonic stenosis. Aorta: The aortic root, ascending aorta and aortic arch are all structurally normal, with no evidence of dilitation or obstruction. Venous: The inferior vena cava is normal in size with less than 50% respiratory variability, suggesting right atrial pressure of 8 mmHg. IAS/Shunts: The atrial septum is grossly normal.  LEFT VENTRICLE PLAX 2D LVIDd:         2.90 cm LVIDs:         1.90 cm LV PW:         0.90 cm LV IVS:        1.10 cm LVOT diam:     2.00 cm LV SV:         37 LV SV Index:   20 LVOT Area:     3.14 cm?  LV Volumes (MOD) LV vol d, MOD A2C: 60.9 ml LV vol d, MOD A4C: 71.0 ml LV vol s, MOD A2C: 42.2 ml LV vol s, MOD A4C: 36.4 ml LV SV MOD A2C:     18.7 ml LV SV MOD A4C:     71.0 ml LV SV MOD BP:      29.7 ml RIGHT VENTRICLE            IVC RV Basal diam:  3.40 cm    IVC diam: 1.70 cm RV S prime:     9.68 cm/s TAPSE (M-mode): 1.8 cm LEFT ATRIUM           Index        RIGHT ATRIUM           Index LA diam:      2.60 cm 1.42 cm/m?   RA Area:     11.50 cm? LA Vol (A2C): 36.9 ml 20.21 ml/m?  RA Volume:   24.40 ml  13.36 ml/m? LA Vol (A4C): 33.6 ml 18.40 ml/m?  AORTIC VALVE AV Area (Vmax):    2.54 cm? AV Area (Vmean):   2.51 cm? AV Area (VTI):     2.37 cm? AV Vmax:           98.40 cm/s AV Vmean:          66.300 cm/s AV VTI:            0.158 m AV Peak Grad:      3.9 mmHg AV Mean Grad:      2.0 mmHg  LVOT Vmax:         79.70 cm/s LVOT Vmean:        52.900 cm/s LVOT VTI:          0.119 m LVOT/AV VTI ratio: 0.75  AORTA Ao Root diam: 2.30 cm Ao Asc diam:  2.40 cm TRICUSPID VALVE TR Peak grad:   18.5 mmHg

## 2022-01-26 ENCOUNTER — Inpatient Hospital Stay (HOSPITAL_COMMUNITY): Payer: Medicare Other

## 2022-01-26 DIAGNOSIS — E870 Hyperosmolality and hypernatremia: Secondary | ICD-10-CM | POA: Diagnosis not present

## 2022-01-26 LAB — CBC
HCT: 34.9 % — ABNORMAL LOW (ref 36.0–46.0)
Hemoglobin: 11.2 g/dL — ABNORMAL LOW (ref 12.0–15.0)
MCH: 28.5 pg (ref 26.0–34.0)
MCHC: 32.1 g/dL (ref 30.0–36.0)
MCV: 88.8 fL (ref 80.0–100.0)
Platelets: 113 10*3/uL — ABNORMAL LOW (ref 150–400)
RBC: 3.93 MIL/uL (ref 3.87–5.11)
RDW: 15.9 % — ABNORMAL HIGH (ref 11.5–15.5)
WBC: 4 10*3/uL (ref 4.0–10.5)
nRBC: 0 % (ref 0.0–0.2)

## 2022-01-26 LAB — BASIC METABOLIC PANEL
Anion gap: 4 — ABNORMAL LOW (ref 5–15)
BUN: 18 mg/dL (ref 6–20)
CO2: 29 mmol/L (ref 22–32)
Calcium: 8.8 mg/dL — ABNORMAL LOW (ref 8.9–10.3)
Chloride: 104 mmol/L (ref 98–111)
Creatinine, Ser: 0.72 mg/dL (ref 0.44–1.00)
GFR, Estimated: >60 mL/min (ref >60)
Glucose, Bld: 85 mg/dL (ref 70–99)
Potassium: 4.1 mmol/L (ref 3.5–5.1)
Sodium: 137 mmol/L (ref 135–145)

## 2022-01-26 MED ORDER — MENTHOL 3 MG MT LOZG
1.0000 | LOZENGE | OROMUCOSAL | Status: DC | PRN
  Administered 2022-01-27: 3 mg via ORAL
  Filled 2022-01-26: qty 9

## 2022-01-26 MED ORDER — SIMETHICONE 80 MG PO CHEW
80.0000 mg | CHEWABLE_TABLET | Freq: Once | ORAL | Status: AC
  Administered 2022-01-26: 80 mg via ORAL
  Filled 2022-01-26: qty 1

## 2022-01-26 MED ORDER — LACTATED RINGERS IV SOLN: INTRAVENOUS | Status: AC

## 2022-01-26 MED ORDER — METOPROLOL TARTRATE 25 MG PO TABS
25.0000 mg | ORAL_TABLET | Freq: Two times a day (BID) | ORAL | Status: DC
  Administered 2022-01-26 – 2022-01-27 (×2): 25 mg via ORAL
  Filled 2022-01-26 (×4): qty 1

## 2022-01-26 NOTE — Progress Notes (Signed)
?   01/26/22 1211  ?Assess: MEWS Score  ?Temp 98 ?F (36.7 ?C)  ?BP (!) 89/63  ?Level of Consciousness Alert  ?O2 Device Room Air  ?Assess: MEWS Score  ?MEWS Temp 0  ?MEWS Systolic 1  ?MEWS Pulse 1  ?MEWS RR 0  ?MEWS LOC 0  ?MEWS Score 2  ?MEWS Score Color Yellow  ?Assess: if the MEWS score is Yellow or Red  ?Were vital signs taken at a resting state? Yes  ?Focused Assessment No change from prior assessment  ?Early Detection of Sepsis Score *See Row Information* Low  ?MEWS guidelines implemented *See Row Information* Yes  ?Treat  ?Pain Scale 0-10  ?Pain Score 0  ?Take Vital Signs  ?Increase Vital Sign Frequency  Yellow: Q 2hr X 2 then Q 4hr X 2, if remains yellow, continue Q 4hrs  ?Escalate  ?MEWS: Escalate Yellow: discuss with charge nurse/RN and consider discussing with provider and RRT  ?Notify: Charge Nurse/RN  ?Name of Charge Nurse/RN Notified Yoko, RN  ?Date Charge Nurse/RN Notified 01/26/22  ?Time Charge Nurse/RN Notified 1211  ?Notify: Provider  ?Provider Name/Title DO Faustino Congress  ?Date Provider Notified 01/26/22  ?Time Provider Notified 1200  ?Notification Type Page  ?Notification Reason Change in status;Other (Comment) ?(hypotension)  ?Provider response No new orders  ?Date of Provider Response 01/26/22  ?Time of Provider Response 1211  ? ? ?

## 2022-01-26 NOTE — Evaluation (Signed)
Physical Therapy Evaluation ?Patient Details ?Name: Kathy Brown ?MRN: [id number] ?DOB: 1981-06-04 ?Today's Date: 01/26/2022 ? ?History of Present Illness ? Pt is a 41 y.o. female who [place] [id number] [facility] due to pt being aggressive toward her parents and noncompliant with her meds. Pt admitted with hypernatremia, AKI, paranoid schizophrenia, acute metabolic encephalopathy, and permanent Afib with RVR. PMH: paranoid schizophrenia, intellectual disability, permanent atrial fibrillation not on oral anticoagulation, history of aggressive behavior ?  ?Clinical Impression ? Pt presents with condition above and deficits mentioned below, see PT Problem List. Pt is a poor historian and easily distracted, unable to report her PLOF or home set-up stating "I don't know". Attempted to call pt's mother for this information, but no answer. Currently, pt displays deficits in gross strength, coordination, balance, activity tolerance, and cognition. She required min guard for bed mobility and minA for transfers to stand and to take 2 steps at EOB. Pt was limited from further mobility by a decline in BP (See below) and an increase in her HR up to 172 bpm, RN and MD made aware. If pt plans to d/c to home or to a [facility] and they can safely manage her and provide the level of assistance she needs then recommend HHPT. However, if this is not the case, then recommend short-term rehab at a SNF. Will continue to follow acutely. ? ?BP:  ?90/53 supine ?94/58 sitting ?78/68 sitting after standing ?   ? ?Recommendations for follow up therapy are one component of a multi-disciplinary discharge planning process, led by the attending physician.  Recommendations may be updated based on patient status, additional functional criteria and insurance authorization. ? ?Follow Up Recommendations Skilled nursing-short term rehab (<3 hours/day) (if family or [facility] can manage her then HHPT at home or [facility]) ? ?   ?Assistance Recommended at Discharge Frequent or constant Supervision/Assistance  ?Patient can return home with the following ? A little help with walking and/or transfers;A little help with bathing/dressing/bathroom;Assistance with cooking/housework;Direct supervision/assist for medications management;Direct supervision/assist for financial management;Assist for transportation;Help with stairs or ramp for entrance ? ?  ?Equipment Recommendations Rolling walker (2 wheels);BSC/3in1 (unsure of home DME pt already has)  ?Recommendations for Other Services ?    ?  ?Functional Status Assessment Patient has had a recent decline in their functional status and demonstrates the ability to make significant improvements in function in a reasonable and predictable amount of time. (assumed, unsure though if this her baseline or not)  ? ?  ?Precautions / Restrictions Precautions ?Precautions: Fall;Other (comment) ?Precaution Comments: watch HR and BP ?Restrictions ?Weight Bearing Restrictions: No  ? ?  ? ?Mobility ? Bed Mobility ?Overal bed mobility: Needs Assistance ?Bed Mobility: Supine to Sit, Sit to Supine ?  ?  ?Supine to sit: Min guard, HOB elevated ?Sit to supine: Min guard, HOB elevated ?  ?General bed mobility comments: Min guard for safety, needing cues to direct pt. ?  ? ?Transfers ?Overall transfer level: Needs assistance ?Equipment used: 1 person hand held assist ?Transfers: Sit to/from Stand ?Sit to Stand: Min assist ?  ?  ?  ?  ?  ?General transfer comment: MinA to steady with power up to stand as pt displayed posterior sway. ?  ? ?Ambulation/Gait ?Ambulation/Gait assistance: Min assist ?Gait Distance (Feet): 2 Feet ?Assistive device: None, Rolling walker (2 wheels) ?Gait Pattern/deviations: Step-through pattern, Decreased stride length, Narrow base of support, Scissoring ?Gait velocity: reduced ?Gait velocity interpretation: <1.8 ft/sec, indicate of risk  for recurrent falls ?  ?General Gait Details: Pt taking a  couple steps impulsively at EOB eventually grabbing RW, displaying an unsteady, almost scissoring gait pattern, needing minA for stability. Pt's HR jumped to 172 bpm, thus was returned to sit for safety. ? ?Stairs ?  ?  ?  ?  ?  ? ?Wheelchair Mobility ?  ? ?Modified Rankin (Stroke Patients Only) ?  ? ?  ? ?Balance Overall balance assessment: Needs assistance ?Sitting-balance support: No upper extremity supported, Feet supported ?Sitting balance-Leahy Scale: Fair ?Sitting balance - Comments: Static sitting EOB with supervision for safety ?  ?Standing balance support: No upper extremity supported, Single extremity supported ?Standing balance-Leahy Scale: Fair ?Standing balance comment: Able to stand statically without UE support, but displays trunk sway and instability when trying to take a step, benefitting from UE support and up to minA ?  ?  ?  ?  ?  ?  ?  ?  ?  ?  ?  ?   ? ? ? ?Pertinent Vitals/Pain Pain Assessment ?Pain Assessment: Faces ?Faces Pain Scale: Hurts a little bit ?Pain Location: central chest when eating per pt ?Pain Descriptors / Indicators: Discomfort ?Pain Intervention(s): Limited activity within patient's tolerance, Monitored during session, Repositioned  ? ? ?Home Living Family/patient expects to be discharged to:: Unsure ?  ?  ?  ?  ?  ?  ?  ?  ?  ?Additional Comments: Pt unreliable historian, stating she was not sure who she lives with or if she uses an AD for mobility. Tried to call mother listed in pt's chart, but no answer.  ?  ?Prior Function Prior Level of Function : Needs assist ?  ?  ?  ?  ?  ?  ?Mobility Comments: Pt unreliable historian, stating she was not sure who she lives with or if she uses an AD for mobility. Tried to call mother listed in pt's chart, but no answer. Likely, pt needed some assistance for mobility or ADLs though ?  ?  ? ? ?Hand Dominance  ?   ? ?  ?Extremity/Trunk Assessment  ? Upper Extremity Assessment ?Upper Extremity Assessment: Defer to OT evaluation ?   ? ?Lower Extremity Assessment ?Lower Extremity Assessment: Generalized weakness ?  ? ?Cervical / Trunk Assessment ?Cervical / Trunk Assessment: Normal  ?Communication  ? Communication: No difficulties  ?Cognition Arousal/Alertness: Awake/alert ?Behavior During Therapy: Flat affect, Anxious, Impulsive ?Overall Cognitive Status: History of cognitive impairments - at baseline ?  ?  ?  ?  ?  ?  ?  ?  ?  ?  ?  ?  ?  ?  ?  ?  ?General Comments: Pt reporting having nightmares and being afraid, maintaining a flat affect throughout session. Unsure if pt was hallucinating, intermittently talking about other people in the room but when asked she denied seeing anyone except herself and the therapist in the room. Pt with hx of cognitive deficits. Poor safety and deficits awareness, memory, and easily distracted. ?  ?  ? ?  ?General Comments General comments (skin integrity, edema, etc.): HR up to 172 bpm when standing, returned to 100-110s with sitting; BP 90/53 supine, 94/58 sitting, 78/68 sitting after standing; pt unreliable historian and easily distracted and unable to accurately/reliably identify if she was dizzy or lightheaded ? ?  ?Exercises    ? ?Assessment/Plan  ?  ?PT Assessment Patient needs continued PT services  ?PT Problem List Decreased strength;Decreased activity tolerance;Decreased balance;Decreased mobility;Decreased coordination;Decreased cognition;Decreased knowledge  of use of DME;Decreased safety awareness;Cardiopulmonary status limiting activity ? ?   ?  ?PT Treatment Interventions DME instruction;Gait training;Stair training;Functional mobility training;Therapeutic activities;Therapeutic exercise;Balance training;Neuromuscular re-education;Cognitive remediation;Patient/family education   ? ?PT Goals (Current goals can be found in the Care Plan section)  ?Acute Rehab PT Goals ?Patient Stated Goal: to not be afraid ?PT Goal Formulation: With patient ?Time For Goal Achievement: 02/09/22 ?Potential to Achieve  Goals: Good ? ?  ?Frequency Min 3X/week ?  ? ? ?Co-evaluation   ?  ?  ?  ?  ? ? ?  ?AM-PAC PT "6 Clicks" Mobility  ?Outcome Measure Help needed turning from your back to your side while in a flat bed withou

## 2022-01-26 NOTE — Progress Notes (Signed)
? ?Progress Note ? ?Patient Name: Kathy Brown ?Date of Encounter: 01/26/2022 ? ?[facility] Cardiologist: Sanda Klein, MD  ? ?Subjective  ? ?Patient is sleeping comfortably  Flat in bed  ?Inpatient Medications  ?  ?Scheduled Meds: ? aspirin EC  81 mg Oral Daily  ? atorvastatin  40 mg Oral Daily  ? digoxin  0.25 mg Oral Daily  ? enoxaparin (LOVENOX) injection  40 mg Subcutaneous Q24H  ? polyethylene glycol  34 g Oral Once  ? risperiDONE  1 mg Oral BID  ? ?Continuous Infusions: ? ? ?PRN Meds: ?acetaminophen, benztropine, hydrOXYzine, menthol-cetylpyridinium, ondansetron (ZOFRAN) IV, traZODone  ? ?Vital Signs  ?  ?Vitals:  ? 01/25/22 2004 01/26/22 0007 01/26/22 0401 01/26/22 0700  ?BP:  96/76 97/70 108/68  ?Pulse: (!) 45 94 95 98  ?Resp: 18 13 14 18   ?Temp:  (!) 97.4 ?F (36.3 ?C) (!) 97.2 ?F (36.2 ?C) 98 ?F (36.7 ?C)  ?TempSrc:  Axillary Axillary Oral  ?SpO2: 100% 98% 100% 100%  ?Weight:      ?Height:      ? ? ?Intake/Output Summary (Last 24 hours) at 01/26/2022 0741 ?Last data filed at 01/25/2022 2106 ?Gross per 24 hour  ?Intake 1570.43 ml  ?Output 1900 ml  ?Net -329.57 ml  ? ? ?  01/23/2022  ?  9:13 PM 10/05/2021  ?  6:17 PM 10/05/2021  ?  6:16 PM  ?Last 3 Weights  ?Weight (lbs) 169 lb 15.6 oz 170 lb 224 lb 13.9 oz  ?Weight (kg) 77.1 kg 77.111 kg 102 kg  ?   ? ?Telemetry  ?  ?Atrial fibrillation.  Rates 70s to 170(transient)  Most HR beflow 100 .- Personally Reviewed ? ?ECG  ?  ?No new.- Personally Reviewed ? ?Physical Exam  ? ?GEN: No acute distress.   ?Neck: No JVD ?Cardiac: Irregularly irregular.  S1, S2.  No murmurs. ?Respiratory: Clear to auscultation bilaterally.anteriorly  ?GI: Soft, nontender, non-distended  ?MS: No edema; No deformity. ?Neuro: Deferred ?Psych: Deferred ?Labs  ?  ?High Sensitivity Troponin:  No results for input(s): TROPONINIHS in the last 720 hours.   ?Chemistry ?Recent Labs  ?Lab 01/23/22 ?2025 01/24/22 ?[id number] 01/24/22 ?[id number] 01/24/22 ?1406 01/25/22 ?0310  ?NA 155* 142 141 139 139  ?K 3.9  3.2* 3.3* 3.4* 3.6  ?CL 119* 109 107  --  107  ?CO2 26 28 27   --  28  ?GLUCOSE 121* 165* 196*  --  114*  ?BUN 46* 42* 42*  --  23*  ?CREATININE 1.62* 0.96 1.13*  --  1.02*  ?CALCIUM 10.9* 9.1 9.3  --  8.8*  ?MG  --   --  2.3  --   --   ?PROT 8.7* 6.4*  --   --  6.0*  ?ALBUMIN 4.9 3.6  --   --  3.1*  ?AST 26 19  --   --  14*  ?ALT 17 15  --   --  13  ?ALKPHOS 34* 26*  --   --  31*  ?BILITOT 1.3* 1.4*  --   --  0.8  ?GFRNONAA 41* >60 >60  --  >60  ?ANIONGAP 10 5 7   --  4*  ?  ?Lipids  ?Recent Labs  ?Lab 01/25/22 ?0310  ?CHOL 215*  ?TRIG 112  ?HDL 26*  ?LDLCALC 167*  ?CHOLHDL 8.3  ?  ?Hematology ?Recent Labs  ?Lab 01/23/22 ?2025 01/24/22 ?1406 01/25/22 ?0310  ?WBC 6.5  --  5.8  ?RBC 5.30*  --  4.08  ?  HGB 14.7 11.2* 11.4*  ?HCT 48.0* 33.0* 36.2  ?MCV 90.6  --  88.7  ?MCH 27.7  --  27.9  ?MCHC 30.6  --  31.5  ?RDW 17.9*  --  16.1*  ?PLT 227  --  111*  ? ?Thyroid  ?Recent Labs  ?Lab 01/24/22 ?7948  ?TSH 0.584  ?  ?BNPNo results for input(s): BNP, PROBNP in the last 168 hours.  ?DDimer No results for input(s): DDIMER in the last 168 hours.  ? ?Radiology  ?  ?ECHOCARDIOGRAM COMPLETE ? ?Result Date: 01/24/2022 ?   ECHOCARDIOGRAM REPORT   Patient Name:   Kathy Brown Date of Exam: 01/24/2022 Medical Rec #:  [id number]       Height:       64.0 in Accession #:    [id number]      Weight:       170.0 lb Date of Birth:  1981/10/15       BSA:          1.826 m? Patient Age:    40 years        BP:           91/59 mmHg Patient Gender: F               HR:           99 bpm. Exam Location:  Inpatient Procedure: 2D Echo, Cardiac Doppler and Color Doppler Indications:    Atrial fibrillation  History:        Patient has prior history of Echocardiogram examinations, most                 recent 09/09/2018. Arrythmias:Atrial Fibrillation. Acute kidney                 injury.  Sonographer:     Ludwig RDCS (AE) Referring Phys: [id number] ERIC CHEN  Sonographer Comments: Patient unable to complete IVC collapse test. IMPRESSIONS  1. Left ventricular  ejection fraction, by estimation, is 45 to 50%. The left ventricle has mildly decreased function. The left ventricle demonstrates global hypokinesis. Left ventricular diastolic function could not be evaluated.  2. Right ventricular systolic function is normal. The right ventricular size is normal. There is normal pulmonary artery systolic pressure.  3. The mitral valve is normal in structure. Trivial mitral valve regurgitation. No evidence of mitral stenosis.  4. The aortic valve is tricuspid. There is mild calcification of the aortic valve. Aortic valve regurgitation is not visualized. No aortic stenosis is present.  5. The inferior vena cava is normal in size with <50% respiratory variability, suggesting right atrial pressure of 8 mmHg. Comparison(s): Changes from prior study are noted. EF decreased from prior, but patient is in afib RVR throughout current study. FINDINGS  Left Ventricle: Left ventricular ejection fraction, by estimation, is 45 to 50%. The left ventricle has mildly decreased function. The left ventricle demonstrates global hypokinesis. The left ventricular internal cavity size was normal in size. There is  borderline left ventricular hypertrophy. Left ventricular diastolic function could not be evaluated due to atrial fibrillation. Left ventricular diastolic function could not be evaluated. Right Ventricle: The right ventricular size is normal. No increase in right ventricular wall thickness. Right ventricular systolic function is normal. There is normal pulmonary artery systolic pressure. The tricuspid regurgitant velocity is 2.15 m/s, and  with an assumed right atrial pressure of 8 mmHg, the estimated right ventricular systolic pressure is 26.5 mmHg. Left Atrium: Left atrial size was normal in size. Right  Atrium: Right atrial size was normal in size. Pericardium: There is no evidence of pericardial effusion. Mitral Valve: The mitral valve is normal in structure. Trivial mitral valve  regurgitation. No evidence of mitral valve stenosis. Tricuspid Valve: The tricuspid valve is normal in structure. Tricuspid valve regurgitation is mild . No evidence of tricuspid stenosis. Aortic Valve: The aortic valve is tricuspid. There is mild calcification of the aortic valve. Aortic valve regurgitation is not visualized. No aortic stenosis is present. Aortic valve mean gradient measures 2.0 mmHg. Aortic valve peak gradient measures 3.9 mmHg. Aortic valve area, by VTI measures 2.37 cm?. Pulmonic Valve: The pulmonic valve was grossly normal. Pulmonic valve regurgitation is mild. No evidence of pulmonic stenosis. Aorta: The aortic root, ascending aorta and aortic arch are all structurally normal, with no evidence of dilitation or obstruction. Venous: The inferior vena cava is normal in size with less than 50% respiratory variability, suggesting right atrial pressure of 8 mmHg. IAS/Shunts: The atrial septum is grossly normal.  LEFT VENTRICLE PLAX 2D LVIDd:         2.90 cm LVIDs:         1.90 cm LV PW:         0.90 cm LV IVS:        1.10 cm LVOT diam:     2.00 cm LV SV:         37 LV SV Index:   20 LVOT Area:     3.14 cm?  LV Volumes (MOD) LV vol d, MOD A2C: 60.9 ml LV vol d, MOD A4C: 71.0 ml LV vol s, MOD A2C: 42.2 ml LV vol s, MOD A4C: 36.4 ml LV SV MOD A2C:     18.7 ml LV SV MOD A4C:     71.0 ml LV SV MOD BP:      29.7 ml RIGHT VENTRICLE            IVC RV Basal diam:  3.40 cm    IVC diam: 1.70 cm RV S prime:     9.68 cm/s TAPSE (M-mode): 1.8 cm LEFT ATRIUM           Index        RIGHT ATRIUM           Index LA diam:      2.60 cm 1.42 cm/m?   RA Area:     11.50 cm? LA Vol (A2C): 36.9 ml 20.21 ml/m?  RA Volume:   24.40 ml  13.36 ml/m? LA Vol (A4C): 33.6 ml 18.40 ml/m?  AORTIC VALVE AV Area (Vmax):    2.54 cm? AV Area (Vmean):   2.51 cm? AV Area (VTI):     2.37 cm? AV Vmax:           98.40 cm/s AV Vmean:          66.300 cm/s AV VTI:            0.158 m AV Peak Grad:      3.9 mmHg AV Mean Grad:      2.0 mmHg LVOT  Vmax:         79.70 cm/s LVOT Vmean:        52.900 cm/s LVOT VTI:          0.119 m LVOT/AV VTI ratio: 0.75  AORTA Ao Root diam: 2.30 cm Ao Asc diam:  2.40 cm TRICUSPID VALVE TR Peak grad:   18.5 mmHg TR Vmax:

## 2022-01-26 NOTE — Progress Notes (Signed)
? ? ?PROGRESS NOTE ? ?[name] [id number] DOB: 1981-10-04 DOA: 01/23/2022 ?PCP: Pcp, No ? ?HPI/Recap of past 45 hours: ?41 year old African-American female with a history of paranoid schizophrenia, intellectual disability, permanent atrial fibrillation not on oral anticoagulation, history of aggressive behavior who presents with [facility] [employer] from behavioral health urgent care.  Reportedly patient has been aggressive toward her parents and not taking her medications.  Labs significant for hypernatremia with serum sodium of 155, hypovolemic.  Acute kidney injury likely prerenal from dehydration.  Admitted by hospitalist service, [place].  Seen by psychiatry, following. ? ?Hospital course complicated by A-fib with RVR for which cardiology was consulted.  2D echo done on 01/24/2022 revealed LVEF 45 to 50% with LV global hypokinesis.  Her home beta-blocker and calcium channel blocker were held due to soft BPs to avoid hypotension.  She was started on digoxin by cardiology on 01/25/2022. ?  ?Additional history copied from [facility] intake. ?  ?"the patient stopped eating, drinking, and bathing 2 weeks ago.  She states that the patient has been exhibiting outbursts that she describes as the patient crying, screaming, and talking to herself as if multiple people are present almost every night between 2 AM to 6 AM. She states that the patient has been peeing on herself because she is afraid to go to the bathroom for the past 2 weeks. She states that she does not believe that the patient has had a bowel movement in 2 weeks. She states that the patient loss about 100 pounds within the past couple months and 15 pounds in the past 2 weeks. Ms. Mare Ferrari states that the patient was here in January and did good for 2 weeks after she was discharged. She states that the patient stopped taking her medications. Patient resides with her mother Dannielle Karvonen and her three children. "  ?  ?01/26/2022: Patient was seen and  examined at bedside.  She has no new complaints.  Heart rate is improved with low 100s on digoxin.  BPs remain soft.  Appreciate cardiology's assistance. ? ?Assessment/Plan: ?Principal Problem: ?  Hypernatremia ?Active Problems: ?  AKI (acute kidney injury) ([place]) ?  Paranoid schizophrenia ([place]) ?  A-fib ([place]) ?  Rapid atrial fibrillation ([place]) ? ?Resolved hypovolemic hypernatremia ?Presented with serum sodium of 155, hypovolemic on exam, received D5W at 100 cc/h.  Repeated serum sodium 139 on 01/25/2022.  IV fluid has been stopped. ?Continue to encourage oral fluid intake to avoid dehydration. ? ?Hyperlipidemia ?Fasting lipid panel, LDL 167 ?On Lipitor 40 mg daily, also on aspirin 81 mg daily. ?  ?Resolved post IV fluid hydration: Prerenal AKI (acute kidney injury) ([place]) ?Back to her baseline creatinine. ?Continue to follow for toxic agents, dehydration and hypotension. ?Monitor urine output as able. ?  ?Permanent A-fib ([place]) with RVR, RVR is improved. ?Pt is non-compliant with her psych meds. Therefore, not a good candidate for long term systemic anticoagulation. Also with her hx of violence/acting out physically, she has an increased risk of bleeding. [place] VASC score of 1.  Defer decision to anticoagulate to cardiology.   ?On Cardizem and Toprol-XL had been held due to soft BPs.  Started on digoxin by cardiology on 01/25/2022.  ?2D echo done in 2019 with LVEF 55 to 60% with no regional wall motion abnormalities. ?2D echo on 01/24/2022 showed LVEF 45 to 50%, global hypokinesis.   ?Cardiology is following ?  ?Paranoid schizophrenia ([place]) ?Medical management per psychiatry. ?  ?Resolved acute metabolic encephalopathy, suspect multifactorial ?Contributed by hypernatremia,  underlying psych issues ?Continue to treat underlying conditions ?Continue to reorient as needed ?One-to-one sitter in place for patient's own safety ?  ?  ?DVT prophylaxis: Subcu Lovenox daily ?Code Status: Full Code  ?Family Communication: No family  members at bedside. ?Disposition Plan: return home vs admit to [facility] after discharge  ?Consults called: Psychiatry ?Admission status: Inpatient. ?  ?  ? ? ? ?Status is: Inpatient.  Patient requires at least 2 midnights for further evaluation and treatment of present condition. ? ? ? ? ?Objective: ?Vitals:  ? 01/26/22 0007 01/26/22 0401 01/26/22 0700 01/26/22 0806  ?BP: 96/76 97/70 108/68   ?Pulse: 94 95 98 (!) 108  ?Resp: 13 14 18    ?Temp: (!) 97.4 ?F (36.3 ?C) (!) 97.2 ?F (36.2 ?C) 98 ?F (36.7 ?C)   ?TempSrc: Axillary Axillary Oral   ?SpO2: 98% 100% 100%   ?Weight:      ?Height:      ? ? ?Intake/Output Summary (Last 24 hours) at 01/26/2022 1035 ?Last data filed at 01/26/2022 [id number] ?Gross per 24 hour  ?Intake 1450.43 ml  ?Output 1300 ml  ?Net 150.43 ml  ? ?Filed Weights  ? 01/23/22 2113  ?Weight: 77.1 kg  ? ? ?Exam: ? ?General: 41 y.o. year-old female well-developed and nourished in O2 sat.  She is alert and interactive. ?Cardiovascular: Irregular rate and rhythm no rubs or gallops. ?Respiratory: Clear to auscultation no wheezes or rales. ?Abdomen: Soft with normal bowel sounds. ?Musculoskeletal: No lower extremity edema bilaterally.   ?Skin: No ulcerative lesions seen. ?Psychiatry: Mood is appropriate for condition. ? ?Data Reviewed: ?CBC: ?Recent Labs  ?Lab 01/23/22 ?2025 01/24/22 ?1406 01/25/22 ?0310  ?WBC 6.5  --  5.8  ?HGB 14.7 11.2* 11.4*  ?HCT 48.0* 33.0* 36.2  ?MCV 90.6  --  88.7  ?PLT 227  --  111*  ? ?Basic Metabolic Panel: ?Recent Labs  ?Lab 01/23/22 ?2025 01/24/22 ?[id number] 01/24/22 ?[id number] 01/24/22 ?1406 01/25/22 ?0310  ?NA 155* 142 141 139 139  ?K 3.9 3.2* 3.3* 3.4* 3.6  ?CL 119* 109 107  --  107  ?CO2 26 28 27   --  28  ?GLUCOSE 121* 165* 196*  --  114*  ?BUN 46* 42* 42*  --  23*  ?CREATININE 1.62* 0.96 1.13*  --  1.02*  ?CALCIUM 10.9* 9.1 9.3  --  8.8*  ?MG  --   --  2.3  --   --   ? ?GFR: ?Estimated Creatinine Clearance: 75.2 mL/min (A) (by C-G formula based on SCr of 1.02 mg/dL (H)). ?Liver Function  Tests: ?Recent Labs  ?Lab 01/23/22 ?2025 01/24/22 ?[id number] 01/25/22 ?0310  ?AST 26 19 14*  ?ALT 17 15 13   ?ALKPHOS 34* 26* 31*  ?BILITOT 1.3* 1.4* 0.8  ?PROT 8.7* 6.4* 6.0*  ?ALBUMIN 4.9 3.6 3.1*  ? ?No results for input(s): LIPASE, AMYLASE in the last 168 hours. ?No results for input(s): AMMONIA in the last 168 hours. ?Coagulation Profile: ?No results for input(s): INR, PROTIME in the last 168 hours. ?Cardiac Enzymes: ?No results for input(s): CKTOTAL, CKMB, CKMBINDEX, TROPONINI in the last 168 hours. ?BNP (last 3 results) ?No results for input(s): PROBNP in the last 8760 hours. ?HbA1C: ?No results for input(s): HGBA1C in the last 72 hours. ?CBG: ?No results for input(s): GLUCAP in the last 168 hours. ?Lipid Profile: ?Recent Labs  ?  01/25/22 ?0310  ?CHOL 215*  ?HDL 26*  ?LDLCALC 167*  ?TRIG 112  ?CHOLHDL 8.3  ? ?Thyroid Function Tests: ?Recent Labs  ?  01/24/22 ?[id number]  ?TSH 0.584  ? ?Anemia Panel: ?No results for input(s): VITAMINB12, FOLATE, FERRITIN, TIBC, IRON, RETICCTPCT in the last 72 hours. ?Urine analysis: ?   ?Component Value Date/Time  ? COLORURINE STRAW (A) 01/24/2022 2324  ? APPEARANCEUR CLEAR 01/24/2022 2324  ? LABSPEC 1.003 (L) 01/24/2022 2324  ? PHURINE 6.0 01/24/2022 2324  ? GLUCOSEU NEGATIVE 01/24/2022 2324  ? [place] NEGATIVE 01/24/2022 2324  ? [place] NEGATIVE 01/24/2022 2324  ? [place] NEGATIVE 01/24/2022 2324  ? [place] NEGATIVE 01/24/2022 2324  ? UROBILINOGEN 0.2 09/20/2014 1135  ? NITRITE NEGATIVE 01/24/2022 2324  ? LEUKOCYTESUR NEGATIVE 01/24/2022 2324  ? ?Sepsis Labs: ?@LABRCNTIP (procalcitonin:4,lacticidven:4) ? ?)No results found for this or any previous visit (from the past 240 hour(s)).  ? ? ?Studies: ?No results found. ? ?Scheduled Meds: ? aspirin EC  81 mg Oral Daily  ? atorvastatin  40 mg Oral Daily  ? digoxin  0.25 mg Oral Daily  ? enoxaparin (LOVENOX) injection  40 mg Subcutaneous Q24H  ? polyethylene glycol  34 g Oral Once  ? risperiDONE  1 mg Oral BID  ? ? ?Continuous  Infusions: ? ? ? ? LOS: 2 days  ? ? ? ?Kayleen Memos, MD ?[facility] ?Pager [phone] ? ?If 7PM-7AM, please contact night-coverage ?[email or web address] ?Password [email or web address] ?01/26/2022, 10:35 AM  ?  ? ? ? ? ? ? ? ? ? ? ?

## 2022-01-27 DIAGNOSIS — E870 Hyperosmolality and hypernatremia: Secondary | ICD-10-CM | POA: Diagnosis not present

## 2022-01-27 LAB — CBC
HCT: 32.4 % — ABNORMAL LOW (ref 36.0–46.0)
Hemoglobin: 10.2 g/dL — ABNORMAL LOW (ref 12.0–15.0)
MCH: 28 pg (ref 26.0–34.0)
MCHC: 31.5 g/dL (ref 30.0–36.0)
MCV: 89 fL (ref 80.0–100.0)
Platelets: 116 10*3/uL — ABNORMAL LOW (ref 150–400)
RBC: 3.64 MIL/uL — ABNORMAL LOW (ref 3.87–5.11)
RDW: 16 % — ABNORMAL HIGH (ref 11.5–15.5)
WBC: 3.9 10*3/uL — ABNORMAL LOW (ref 4.0–10.5)
nRBC: 0 % (ref 0.0–0.2)

## 2022-01-27 LAB — BASIC METABOLIC PANEL
Anion gap: 3 — ABNORMAL LOW (ref 5–15)
BUN: 15 mg/dL (ref 6–20)
CO2: 31 mmol/L (ref 22–32)
Calcium: 9 mg/dL (ref 8.9–10.3)
Chloride: 104 mmol/L (ref 98–111)
Creatinine, Ser: 0.75 mg/dL (ref 0.44–1.00)
GFR, Estimated: >60 mL/min (ref >60)
Glucose, Bld: 93 mg/dL (ref 70–99)
Potassium: 4.2 mmol/L (ref 3.5–5.1)
Sodium: 138 mmol/L (ref 135–145)

## 2022-01-27 LAB — BRAIN NATRIURETIC PEPTIDE: B Natriuretic Peptide: 24.5 pg/mL (ref 0.0–100.0)

## 2022-01-27 NOTE — Progress Notes (Addendum)
RE: Kathy Brown  ? ?Date of Birth: 07/13/1981 ? ?Date: 01/27/2022 ? ?To Whom It May Concern: ? ?Please be advised that the above-named patient will require a short-term nursing home stay - anticipated 30 days or less for rehabilitation and strengthening. The plan is for return home. ?

## 2022-01-27 NOTE — NC FL2 (Signed)
?[place] MEDICAID FL2 LEVEL OF CARE SCREENING TOOL  ?  ? ?IDENTIFICATION  ?Patient Name: ?Kathy Brown Birthdate: [birth year] Sex: female Admission Date (Current Location): ?01/23/2022  ?[place] and [place] Number: ? [place] ?  Facility and Address:  ?[facility] [place], [place] [address], [place], [place] [address] ?     Provider Number: ?[id number]  ?Attending Physician Name and Address:  ?Terrilee Croak, MD ? Relative Name and Phone Number:  ?Rica Mast [phone] ?   ?Current Level of Care: ?Hospital Recommended Level of Care: ?[place] Prior Approval Number: ?  ? ?Date Approved/Denied: ?  PASRR Number: ?PASRR pending ? ?Discharge Plan: ?SNF ?  ? ?Current Diagnoses: ?Patient Active Problem List  ? Diagnosis Date Noted  ? Hypernatremia 01/23/2022  ? AKI (acute kidney injury) ([place]) 01/23/2022  ? Rapid atrial fibrillation ([place]) 01/23/2022  ? A-fib ([place]) 09/08/2018  ? Auditory hallucinations   ? Paranoid schizophrenia ([place])   ? Schizoaffective disorder ([place]) 03/25/2014  ? Psychosis ([place]) 03/25/2014  ? ? ?Orientation RESPIRATION BLADDER Height & Weight   ?  ?Self, Place ? Normal Incontinent Weight: 169 lb 15.6 oz (77.1 kg) ?Height:  5\' 4"  (162.6 cm)  ?BEHAVIORAL SYMPTOMS/MOOD NEUROLOGICAL BOWEL NUTRITION STATUS  ?    Continent (WDL) Diet (Please see discharge summary)  ?AMBULATORY STATUS COMMUNICATION OF NEEDS Skin   ?Limited Assist Verbally Other (Comment) (WDL) ?  ?  ?  ?    ?     ?     ? ? ?Personal Care Assistance Level of Assistance  ?Bathing, Feeding, Dressing Bathing Assistance: Limited assistance ?Feeding assistance: Independent (able to feed self) ?Dressing Assistance: Limited assistance ?   ? ?Functional Limitations Info  ?Sight, Hearing, Speech Sight Info: Adequate ?Hearing Info: Adequate ?Speech Info: Adequate  ? ? ?SPECIAL CARE FACTORS FREQUENCY  ?PT (By licensed PT), OT (By licensed OT)   ?  ?PT Frequency: 5x min weekly ?OT Frequency: 5x min weekly ?  ?  ?  ?    ? ? ?Contractures Contractures Info: Not present  ? ? ?Additional Factors Info  ?Code Status, Allergies, Psychotropic Code Status Info: FULL ?Allergies Info: No Known Allergies ?Psychotropic Info: risperiDONE (RISPERDAL) tablet 1 mg 2 times daily ?  ?  ?   ? ?Current Medications (01/27/2022):  This is the current hospital active medication list ?Current Facility-Administered Medications  ?Medication Dose Route Frequency Provider Last Rate Last Admin  ? acetaminophen (TYLENOL) tablet 650 mg  650 mg Oral Q4H PRN Kristopher Oppenheim, DO   650 mg at 01/25/22 0335  ? aspirin EC tablet 81 mg  81 mg Oral Daily Kristopher Oppenheim, DO   81 mg at 01/27/22 [id number]  ? atorvastatin (LIPITOR) tablet 40 mg  40 mg Oral Daily Irene Pap N, DO   40 mg at 01/27/22 [id number]  ? benztropine (COGENTIN) tablet 0.5 mg  0.5 mg Oral BID PRN Kristopher Oppenheim, DO      ? digoxin (LANOXIN) tablet 0.25 mg  0.25 mg Oral Daily Wilmot, Kobina A, MD   0.25 mg at 01/27/22 0953  ? enoxaparin (LOVENOX) injection 40 mg  40 mg Subcutaneous Q24H Irene Pap N, DO   40 mg at 01/26/22 1144  ? hydrOXYzine (ATARAX) tablet 25 mg  25 mg Oral TID PRN Kristopher Oppenheim, DO   25 mg at 01/25/22 2102  ? lactated ringers infusion   Intravenous Continuous Kayleen Memos, DO 50 mL/hr at 01/27/22 0640 Started During Downtime at 01/27/22 0640  ? menthol-cetylpyridinium (  CEPACOL) lozenge 3 mg  1 lozenge Oral PRN Rise Patience, MD   3 mg at 01/27/22 1111  ? metoprolol tartrate (LOPRESSOR) tablet 25 mg  25 mg Oral BID Fay Records, MD   25 mg at 01/27/22 [id number]  ? ondansetron (ZOFRAN) injection 4 mg  4 mg Intravenous Q6H PRN Kristopher Oppenheim, DO   4 mg at 01/25/22 1647  ? polyethylene glycol (MIRALAX / GLYCOLAX) packet 34 g  34 g Oral Once Kristopher Oppenheim, DO      ? risperiDONE (RISPERDAL) tablet 1 mg  1 mg Oral BID Kristopher Oppenheim, DO   1 mg at 01/27/22 [id number]  ? traZODone (DESYREL) tablet 50 mg  50 mg Oral QHS PRN Kristopher Oppenheim, DO   50 mg at 01/26/22 2216  ? ? ? ?Discharge Medications: ?Please see discharge summary for a  list of discharge medications. ? ?Relevant Imaging Results: ? ?Relevant Lab Results: ? ? ?Additional Information ?[id number], Not covid vaccinated ? ?Milas Gain, LCSWA ? ? ? ? ?

## 2022-01-27 NOTE — TOC Initial Note (Addendum)
Transition of Care (TOC) - Initial/Assessment Note  ? ? ?Patient Details  ?Name: Kathy Brown ?MRN: [id number] ?Date of Birth: Apr 08, 1981 ? ?Transition of Care (TOC) CM/SW Contact:    ?Milas Gain, LCSWA ?Phone Number: ?01/27/2022, 11:19 AM ? ?Clinical Narrative:               ? ?Update- Patients passr under review. CSW submitted requested clinicals to [facility] must for review.   ? ?CSW received consult for possible SNF placement at time of discharge. Due to patients current orientation CSW spoke with patients mother regarding PT recommendation of SNF placement at time of discharge. Patients mother expressed understanding of PT recommendation and is agreeable to SNF placement for patient at time of discharge. Patients mother gave CSW permission to fax out initial referral near the [place] area.  CSW discussed insurance authorization process with patients mother and possible barriers to SNF placement for patient. Patients mother understood and wants to try for SNF placement first and if not plan will be for patient to return home with home health services. Patients mother reports patient will have 24/7 supervision at home. Patients mother reports patient has not received the COVID vaccines.  No further questions reported at this time. CSW to continue to follow and assist with discharge planning needs.  ? ?Expected Discharge Plan: [place] ?Barriers to Discharge: Continued Medical Work up ? ? ?Patient Goals and CMS Choice ?  ?CMS Medicare.gov Compare Post Acute Care list provided to:: Patient Represenative (must comment) (Patients mother Danie Chandler) ?Choice offered to / list presented to : Parent (Patients mother) ? ?Expected Discharge Plan and Services ?Expected Discharge Plan: [place] ?In-house Referral: Clinical Social Work ?  ?  ?Living arrangements for the past 2 months: [place] ?                ?  ?  ?  ?  ?  ?  ?  ?  ?  ?  ? ?Prior Living Arrangements/Services ?Living  arrangements for the past 2 months: [place] ?Lives with:: Self, Parents (Lives with mother) ?Patient language and need for interpreter reviewed:: Yes ?Do you feel safe going back to the place where you live?: No   SNF  ?Need for Family Participation in Patient Care: Yes (Comment) ?Care giver support system in place?: Yes (comment) ?  ?Criminal Activity/Legal Involvement Pertinent to Current Situation/Hospitalization: No - Comment as needed ? ?Activities of Daily Living ?  ?  ? ?Permission Sought/Granted ?Permission sought to share information with : Case Manager, Family Supports, Customer service manager ?Permission granted to share information with : No ? Share Information with NAME: Due to patients current orientation spoke with patients mother ? Permission granted to share info w AGENCY: Due to patients current orientation spoke with patients mother/SNF ? Permission granted to share info w Relationship: Due to patients current orientation spoke with patients mother ? Permission granted to share info w Contact Information: Due to patients current orientation spoke with patients mother Rica Mast [phone] ? ?Emotional Assessment ?  ?  ?  ?Orientation: : Oriented to Self, Oriented to Place ?Alcohol / Substance Use: Not Applicable ?Psych Involvement: No (comment) ? ?Admission diagnosis:  Hypernatremia [E87.0] ?AKI (acute kidney injury) ([place]) [N17.9] ?Patient Active Problem List  ? Diagnosis Date Noted  ? Hypernatremia 01/23/2022  ? AKI (acute kidney injury) ([place]) 01/23/2022  ? Rapid atrial fibrillation ([place]) 01/23/2022  ? A-fib ([place]) 09/08/2018  ? Auditory hallucinations   ? Paranoid schizophrenia (  [place])   ? Schizoaffective disorder ([place]) 03/25/2014  ? Psychosis ([place]) 03/25/2014  ? ?PCP:  Pcp, No ?Pharmacy:   ?[facility]- [address] [place], [place] - [address] ?[address] ?[place] [address] ?Phone: [phone] Fax: [phone] ? ?[id number] -  [place], [place] [address] ?[place] ?[place] [address] ?Phone: [phone] Fax: [phone] ? ?[facility] - [place], [place] - [address] ?[address] ?[place] [address] ?Phone: [phone] Fax: [phone] ? ?[place], [place] [address] ?[place] ?[place] [address] ?Phone: [phone] Fax: [phone] ? ?[place], [place] - [place] ?[place] ?[place] [address] ?Phone: [phone] Fax: [phone] ? ? ? ? ?Social Determinants of Health (SDOH) Interventions ?  ? ?Readmission Risk Interventions ?   ? View : No data to display.  ?  ?  ?  ? ? ? ?

## 2022-01-27 NOTE — Evaluation (Signed)
Occupational Therapy Evaluation ?Patient Details ?Name: Kathy Brown ?MRN: [id number] ?DOB: 04-29-1981 ?Today's Date: 01/27/2022 ? ? ?History of Present Illness 41 y.o. female who presented [place] [facility] the [facility] from behavioral health urgent care due to pt being aggressive toward her parents and noncompliant with her meds. Pt admitted with hypernatremia, AKI, paranoid schizophrenia, acute metabolic encephalopathy, and permanent Afib with RVR. PMH: paranoid schizophrenia, intellectual disability, permanent atrial fibrillation not on oral anticoagulation, history of aggressive behavior  ? ?Clinical Impression ?  ?Pt lives with her parents and her 3 children. She is oriented to herself only. Pt only able to offer limited information about PLOF and home set up, but states the family shares one bathroom. She reports abdominal discomfort. No family available to confirm information provided by pt. Pt is currently functioning at a set up to supervision level. She was unaware of smelling of urine, needing cues to wash off and to change her gown. Recommending return home with her family's supervision.  ?   ? ?Recommendations for follow up therapy are one component of a multi-disciplinary discharge planning process, led by the attending physician.  Recommendations may be updated based on patient status, additional functional criteria and insurance authorization.  ? ?Follow Up Recommendations ? No OT follow up  ?  ?Assistance Recommended at Discharge Frequent or constant Supervision/Assistance  ?Patient can return home with the following A little help with bathing/dressing/bathroom;Direct supervision/assist for medications management;Direct supervision/assist for financial management;Assist for transportation;Assistance with cooking/housework ? ?  ?Functional Status Assessment ? Patient has had a recent decline in their functional status and demonstrates the ability to make significant improvements in function in a  reasonable and predictable amount of time.  ?Equipment Recommendations ? None recommended by OT  ?  ?Recommendations for Other Services   ? ? ?  ?Precautions / Restrictions Precautions ?Precautions: Other (comment) ?Precaution Comments: incontinent at times, without awareness ?Restrictions ?Weight Bearing Restrictions: No  ? ?  ? ?Mobility Bed Mobility ?  ?  ?  ?  ?  ?  ?  ?General bed mobility comments: pt received in chair ?  ? ?Transfers ?Overall transfer level: Needs assistance ?Equipment used: None ?Transfers: Sit to/from Stand ?Sit to Stand: Supervision ?  ?  ?  ?  ?  ?General transfer comment: supervision for safety, pt without awareness of IV line ?  ? ?  ?Balance Overall balance assessment: No apparent balance deficits (not formally assessed) ?  ?Sitting balance-Leahy Scale: Normal ?  ?  ?Standing balance support: No upper extremity supported ?Standing balance-Leahy Scale: Good ?  ?  ?  ?  ?  ?  ?  ?  ?  ?  ?  ?  ?   ? ?ADL either performed or assessed with clinical judgement  ? ?ADL Overall ADL's : Needs assistance/impaired ?Eating/Feeding: Independent;Sitting ?Eating/Feeding Details (indicate cue type and reason): no assist to open containers ?Grooming: Wash/dry hands;Standing;Supervision/safety ?  ?Upper Body Bathing: Supervision/ safety;Standing ?  ?Lower Body Bathing: Supervison/ safety;Sit to/from stand ?  ?Upper Body Dressing : Supervision/safety;Standing ?  ?Lower Body Dressing: Supervision/safety;Sit to/from stand ?  ?Toilet Transfer: Supervision/safety;Ambulation;Regular Toilet ?  ?Toileting- Clothing Manipulation and Hygiene: Supervision/safety;Sit to/from stand ?  ?  ?  ?Functional mobility during ADLs: Supervision/safety ?   ? ? ? ?Vision Baseline Vision/History: 0 No visual deficits ?   ?   ?Perception   ?  ?Praxis   ?  ? ?Pertinent Vitals/Pain Pain Assessment ?Pain Assessment: Faces ?Faces Pain Scale: Hurts a  little bit ?Pain Location: abdomen ?Pain Descriptors / Indicators: Discomfort ?Pain  Intervention(s): Monitored during session  ? ? ? ?Hand Dominance Right ?  ?Extremity/Trunk Assessment Upper Extremity Assessment ?Upper Extremity Assessment: Overall WFL for tasks assessed ?  ?Lower Extremity Assessment ?Lower Extremity Assessment: Defer to PT evaluation ?  ?Cervical / Trunk Assessment ?Cervical / Trunk Assessment: Normal ?  ?Communication Communication ?Communication: No difficulties ?  ?Cognition Arousal/Alertness: Awake/alert ?Behavior During Therapy: Flat affect ?Overall Cognitive Status: No family/caregiver present to determine baseline cognitive functioning ?  ?  ?  ?  ?  ?  ?  ?  ?  ?  ?  ?  ?  ?  ?  ?  ?General Comments: pt is a poor historian, able to state she lives with her parents in a home with one bathroom, states she has a tv in her closet, but it is broken, pt unaware she smells of urine, assisted to change gown ?Pt in afib with HR 71-93. ?  ?  ?General Comments    ? ?  ?Exercises   ?  ?Shoulder Instructions    ? ? ?Home Living Family/patient expects to be discharged to:: Private residence ?Living Arrangements: Children;Parent (parents and her 3 children) ?Available Help at Discharge: Family;Available 24 hours/day ?  ?  ?  ?  ?  ?  ?  ?  ?  ?  ?  ?  ?  ?  ?Additional Comments: pt unable to offer home set up other than the family of 6 share one bathroom ?  ? ?  ?Prior Functioning/Environment Prior Level of Function : Needs assist ?  ?  ?  ?  ?  ?  ?Mobility Comments: walked without device ?ADLs Comments: assisted for medications and IADLs ?  ? ?  ?  ?OT Problem List: Decreased cognition ?  ?   ?OT Treatment/Interventions: Self-care/ADL training  ?  ?OT Goals(Current goals can be found in the care plan section) Acute Rehab OT Goals ?OT Goal Formulation: With patient ?Time For Goal Achievement: 02/10/22 ?Potential to Achieve Goals: Good  ?OT Frequency: Min 2X/week ?  ? ?Co-evaluation   ?  ?  ?  ?  ? ?  ?AM-PAC OT "6 Clicks" Daily Activity     ?Outcome Measure Help from another person  eating meals?: None ?Help from another person taking care of personal grooming?: A Little ?Help from another person toileting, which includes using toliet, bedpan, or urinal?: A Little ?Help from another person bathing (including washing, rinsing, drying)?: A Little ?Help from another person to put on and taking off regular upper body clothing?: A Little ?Help from another person to put on and taking off regular lower body clothing?: A Little ?6 Click Score: 19 ?  ?End of Session   ? ?Activity Tolerance: Patient tolerated treatment well ?Patient left: in chair;with call bell/phone within reach;with chair alarm set ? ?OT Visit Diagnosis: Other symptoms and signs involving cognitive function  ?              ?Time: 1245-8099 ?OT Time Calculation (min): 15 min ?Charges:  OT General Charges ?$OT Visit: 1 Visit ?OT Evaluation ?$OT Eval Moderate Complexity: 1 Mod ? ?Martie Round, OTR/L ?Acute Rehabilitation Services ?Pager: [phone] ?Office: [phone]  ?Evern Bio ?01/27/2022, 2:50 PM ?

## 2022-01-27 NOTE — Progress Notes (Signed)
Physical Therapy Treatment/ Discharge ?Patient Details ?Name: Kathy Brown ?MRN: [id number] ?DOB: Mar 22, 1981 ?Today's Date: 01/27/2022 ? ? ?History of Present Illness 41 y.o. female who presented 02/03/80 to the police department from behavioral health urgent care due to pt being aggressive toward her parents and noncompliant with her meds. Pt admitted with hypernatremia, AKI, paranoid schizophrenia, acute metabolic encephalopathy, and permanent Afib with RVR. PMH: paranoid schizophrenia, intellectual disability, permanent atrial fibrillation not on oral anticoagulation, history of aggressive behavior ? ?  ?PT Comments  ? ? Pt pleasant with flat affect and lack of awareness for urine soaked linens. Pt with good mobility needing supervision for safety and lines but no physical assist with transfers or gait. Pt reports family at home and no need for AD at baseline and does not drive. Pt with functional status conducive to return home with family support for supervision for cognition and safety. No further acute therapy needs with daily ambulation and OOB recommended. Will sign off.   ? ?HR 88-100 ?  ?Recommendations for follow up therapy are one component of a multi-disciplinary discharge planning process, led by the attending physician.  Recommendations may be updated based on patient status, additional functional criteria and insurance authorization. ? ?Follow Up Recommendations ? No PT follow up ?  ?  ?Assistance Recommended at Discharge Intermittent Supervision/Assistance  ?Patient can return home with the following Direct supervision/assist for financial management;Assist for transportation;Direct supervision/assist for medications management ?  ?Equipment Recommendations ? None recommended by PT  ?  ?Recommendations for Other Services   ? ? ?  ?Precautions / Restrictions Precautions ?Precautions: Other (comment) ?Precaution Comments: incontinent at times ?Restrictions ?Weight Bearing Restrictions: No  ?   ? ?Mobility ? Bed Mobility ?Overal bed mobility: Needs Assistance ?  ?  ?  ?  ?Sit to supine: Supervision ?  ?General bed mobility comments: supervision for lines with HOB 20 degrees ?  ? ?Transfers ?Overall transfer level: Needs assistance ?  ?Transfers: Sit to/from Stand, Bed to chair/wheelchair/BSC ?  ?Stand pivot transfers: Min guard ?  ?  ?  ?  ?General transfer comment: supervision for lines and safety for stand with guarding for pivot to toilet ?  ? ?Ambulation/Gait ?Ambulation/Gait assistance: Min guard ?Gait Distance (Feet): 550 Feet ?Assistive device: None ?Gait Pattern/deviations: Step-through pattern, Decreased stride length ?  ?Gait velocity interpretation: >2.62 ft/sec, indicative of community ambulatory ?  ?General Gait Details: pt with steady gait with long hall ambulation ? ? ?Stairs ?  ?  ?  ?  ?  ? ? ?Wheelchair Mobility ?  ? ?Modified Rankin (Stroke Patients Only) ?  ? ? ?  ?Balance Overall balance assessment: No apparent balance deficits (not formally assessed) ?  ?Sitting balance-Leahy Scale: Normal ?Sitting balance - Comments: pt able to sit to take off underwear and perform pericare ?  ?Standing balance support: No upper extremity supported ?Standing balance-Leahy Scale: Good ?Standing balance comment: able to stand and pull up briefs without assist as well as turning without LOB ?  ?  ?  ?  ?  ?  ?  ?  ?  ?  ?  ?  ? ?  ?Cognition Arousal/Alertness: Awake/alert ?Behavior During Therapy: Flat affect ?Overall Cognitive Status: No family/caregiver present to determine baseline cognitive functioning ?  ?  ?  ?  ?  ?  ?  ?  ?  ?  ?  ?  ?  ?  ?  ?  ?General Comments: pt with flat affect, oriented to  person, place and time. Pt soaked in urine as purewick out of place and pt unaware. Unable to recall ages of her kids. No family present to state comparison to baseline ?  ?  ? ?  ?Exercises   ? ?  ?General Comments   ?  ?  ? ?Pertinent Vitals/Pain Pain Assessment ?Pain Assessment: No/denies pain   ? ? ?Home Living   ?  ?  ?  ?  ?  ?  ?  ?  ?  ?   ?  ?Prior Function    ?  ?  ?   ? ?PT Goals (current goals can now be found in the care plan section) Progress towards PT goals: Goals met/education completed, patient discharged from PT ? ?  ?Frequency ? ? ?   ? ? ? ?  ?PT Plan Discharge plan needs to be updated  ? ? ?Co-evaluation   ?  ?  ?  ?  ? ?  ?AM-PAC PT "6 Clicks" Mobility   ?Outcome Measure ? Help needed turning from your back to your side while in a flat bed without using bedrails?: None ?Help needed moving from lying on your back to sitting on the side of a flat bed without using bedrails?: None ?Help needed moving to and from a bed to a chair (including a wheelchair)?: A Little ?Help needed standing up from a chair using your arms (e.g., wheelchair or bedside chair)?: A Little ?Help needed to walk in hospital room?: A Little ?Help needed climbing 3-5 steps with a railing? : A Little ?6 Click Score: 20 ? ?  ?End of Session   ?Activity Tolerance: Patient tolerated treatment well ?Patient left: in chair;with call bell/phone within reach;with chair alarm set ?Nurse Communication: Mobility status ?PT Visit Diagnosis: Other abnormalities of gait and mobility (R26.89) ?  ? ? ?Time: 2277-3750 ?PT Time Calculation (min) (ACUTE ONLY): 26 min ? ?Charges:  $Gait Training: 8-22 mins ?$Therapeutic Activity: 8-22 mins          ?          ? ?Kathy Brown P, PT ?Acute Rehabilitation Services ?Pager: [phone] ?Office: [phone] ? ? ? ?Kathy Brown ?01/27/2022, 12:59 PM ? ?

## 2022-01-27 NOTE — Progress Notes (Signed)
? ?Progress Note ? ?Patient Name: Kathy Brown ?Date of Encounter: 01/27/2022 ? ?Primary Cardiologist: Thurmon Fair, MD  ? ?Subjective  ? ?Borderline blood pressure, but has tolerate tartrate 25 mg PO BID and digoxin without issue. ? ?Yesterday with therapy had elevated heart rates and potentially symptoms from low BP.  None today but has not been OOB.  ? ?Patient notes intermittent abdominal pain.  Notes that she sometimes has palpitations that make her happy or sad; unable to clarify further. ? ?Inpatient Medications  ?  ?Scheduled Meds: ? aspirin EC  81 mg Oral Daily  ? atorvastatin  40 mg Oral Daily  ? digoxin  0.25 mg Oral Daily  ? enoxaparin (LOVENOX) injection  40 mg Subcutaneous Q24H  ? metoprolol tartrate  25 mg Oral BID  ? polyethylene glycol  34 g Oral Once  ? risperiDONE  1 mg Oral BID  ? ?Continuous Infusions: ? lactated ringers 50 mL/hr at 01/27/22 0640  ? ?PRN Meds: ?acetaminophen, benztropine, hydrOXYzine, menthol-cetylpyridinium, ondansetron (ZOFRAN) IV, traZODone  ? ?Vital Signs  ?  ?Vitals:  ? 01/26/22 2049 01/27/22 0445 01/27/22 0640 01/27/22 0953  ?BP: 100/70 (!) 82/42 (!) 90/57 109/65  ?Pulse: 86 67  82  ?Resp: 13 12    ?Temp: 97.8 ?F (36.6 ?C) 97.9 ?F (36.6 ?C)    ?TempSrc: Oral Oral    ?SpO2: 99%     ?Weight:      ?Height:      ? ? ?Intake/Output Summary (Last 24 hours) at 01/27/2022 1101 ?Last data filed at 01/27/2022 0400 ?Gross per 24 hour  ?Intake 719.77 ml  ?Output 900 ml  ?Net -180.23 ml  ? ?Filed Weights  ? 01/23/22 2113  ?Weight: 77.1 kg  ? ? ?Telemetry  ?  ?AF Rates < 110 on average - Personally Reviewed ? ?Physical Exam  ? ?Gen: no distress ?Neck: No JVD ?Cardiac: No Rubs or Gallops, no Murmur, IRIR ?Respiratory: Clear to auscultation bilaterally, normal effort, normal  respiratory rate ?GI: Soft, nontender, non-distended  ?MS: No  edema;  moves all extremities ?Integument: Skin feels warm ?Neuro:  At time of evaluation, alert and oriented to person and place, more or less with  time, more or less with situation ?Psych:  calm ? ? ?Labs  ?  ?Chemistry ?Recent Labs  ?Lab 01/23/22 ?2025 01/24/22 ?2878 01/24/22 ?6767 01/25/22 ?0310 01/26/22 ?1228 01/27/22 ?2094  ?NA 155* 142   < > 139 137 138  ?K 3.9 3.2*   < > 3.6 4.1 4.2  ?CL 119* 109   < > 107 104 104  ?CO2 26 28   < > 28 29 31   ?GLUCOSE 121* 165*   < > 114* 85 93  ?BUN 46* 42*   < > 23* 18 15  ?CREATININE 1.62* 0.96   < > 1.02* 0.72 0.75  ?CALCIUM 10.9* 9.1   < > 8.8* 8.8* 9.0  ?PROT 8.7* 6.4*  --  6.0*  --   --   ?ALBUMIN 4.9 3.6  --  3.1*  --   --   ?AST 26 19  --  14*  --   --   ?ALT 17 15  --  13  --   --   ?ALKPHOS 34* 26*  --  31*  --   --   ?BILITOT 1.3* 1.4*  --  0.8  --   --   ?GFRNONAA 41* >60   < > >60 >60 >60  ?ANIONGAP 10 5   < > 4*  4* 3*  ? < > = values in this interval not displayed.  ?  ? ?Hematology ?Recent Labs  ?Lab 01/25/22 ?0310 01/26/22 ?1228 01/27/22 ?0149  ?WBC 5.8 4.0 3.9*  ?RBC 4.08 3.93 3.64*  ?HGB 11.4* 11.2* 10.2*  ?HCT 36.2 34.9* 32.4*  ?MCV 88.7 88.8 89.0  ?MCH 27.9 28.5 28.0  ?MCHC 31.5 32.1 31.5  ?RDW 16.1* 15.9* 16.0*  ?PLT 111* 113* 116*  ? ? ?Cardiac EnzymesNo results for input(s): TROPONINI in the last 168 hours. No results for input(s): TROPIPOC in the last 168 hours.  ? ?BNP ?Recent Labs  ?Lab 01/27/22 ?0149  ?BNP 24.5  ?  ? ?DDimer No results for input(s): DDIMER in the last 168 hours.  ? ?Radiology  ?  ?DG Abd 1 View ? ?Result Date: 01/26/2022 ?CLINICAL DATA:  Abdominal pain EXAM: ABDOMEN - 1 VIEW COMPARISON:  CT 01/08/2021 FINDINGS: The bowel gas pattern is normal. Scattered stool throughout the colon. No radio-opaque calculi or other significant radiographic abnormality are seen. IMPRESSION: Normal nonobstructive bowel gas pattern with stool-filled colon. Electronically Signed   By: Burman Nieves M.D.   On: 01/26/2022 22:33   ? ? ? ?Patient Profile  ?   ?41 y.o. female with AF RVR in the setting of paranoid schizophrenia, intellectual disability ? ?Assessment & Plan  ?  ?PAF ?HFmrEF ?- long term I  worry that there are no good options for Ms. Spencers atrial fibrillations ?- she is not a candidate for 1C agents in the setting of HFmrEF ?- She would need to be on anticoagulation to start a rhythm control therapy (thought high risk through past notes) ?- would be not be a candidate for Tikoysn and would be high risk for Amiodarone given her risperidone (this is a class effect issue) ?- rate control is limited by hypotension, tolerated metoprolol 25 mg PO BID with no room to increase ?- patient is on Digoxin 0.25; this is a new start, but will need to be monitored as overdose would lead to significant morbidity ?- would need anticoagulation to consider ablation  ? ?- we may need to tolerate higher exertional heart rates given limitations in therapy ? ?- if symptomatic hypotension we could attempt midodrine 10 mg TID to improve BP  ? ? ? ? ?For questions or updates, please contact [facility] ?Please consult [email or web address] for contact info under Cardiology/STEMI. ?  ?   ?Signed, ?Christell Constant, MD  ?01/27/2022, 11:01 AM   ? ?

## 2022-01-27 NOTE — Progress Notes (Signed)
Pt BP continuing to run soft and was due for 25mg  metoprolol with bedtime meds. BP was 91/58 and HR 75. Paged Dr. to ask if he wanted me to give or hold this medication and he advised to recheck BP in 1 hr and if SBP>100 than give metoprolol, but if the recheck SBP is still <100 to hold this dose. Recheck BP was 98/63 at 2228. Medication held and Dr. 2229. Will continue to monitor pt.  ?

## 2022-01-27 NOTE — Progress Notes (Signed)
?PROGRESS NOTE ? ?Kathy Brown  ?DOB: 1981-01-07  ?PCP: Pcp, No ?[id number]  ?DOA: 01/23/2022 ? LOS: 3 days  ?Hospital Day: 5 ? ?Brief narrative: ?Kathy Brown is a 41 y.o. female with PMH significant for paranoid schizophrenia, schizoaffective disorder, anxiety, intellectual disability, permanent A-fib not on oral anticoagulation, history of aggressive behavior who was brought to the ED on 3/30 from behavioral health urgent care.   ?Patient reportedly stopped eating, drinking and taking her meds 2 weeks prior.  She was exhibiting outburst of crying, screaming and talking to herself.  She was being aggressive toward her parents and not taking her medications.   ?In the ED, patient was hypertensive, hyponatremic with sodium level significantly up at 155, AKI.   ?Admitted to hospitalist service.  Psychiatry consulted.   ?Hospital course also complicated by A-fib with RVR for which cardiology was consulted.  ?See below for details. ? ?Subjective: ?Patient was seen and examined this morning. ?African-American female. ?Lying on bed.  Not in distress. ?Mental status seems stable. ? ?Principal Problem: ?  Hypernatremia ?Active Problems: ?  AKI (acute kidney injury) ([place]) ?  Paranoid schizophrenia ([place]) ?  A-fib ([place]) ?  Rapid atrial fibrillation ([place]) ?  ?Assessment and Plan: ?Resolved hypovolemic hypernatremia ?-Presented with dehydration from not eating or drinking, serum sodium level elevated to 155.  Improved to normal with hypotonic IV fluid.   ?-Currently off IV fluid.  Encourage oral hydration.   ?Recent Labs  ?Lab 01/23/22 ?2025 01/24/22 ?[id number] 01/24/22 ?[id number] 01/24/22 ?1406 01/25/22 ?0310 01/26/22 ?1228 01/27/22 ?[id number]  ?NA 155* 142 141 139 139 137 138  ? ?AKI ?-Creatinine was elevated to 1.62 because of dehydration.  Improved with IV fluid. ?Recent Labs  ?  10/05/21 ?2015 01/23/22 ?2025 01/24/22 ?[id number] 01/24/22 ?[id number] 01/25/22 ?0310 01/26/22 ?1228 01/27/22 ?[id number]  ?BUN 19 46* 42* 42* 23* 18 15  ?CREATININE 0.93  1.62* 0.96 1.13* 1.02* 0.72 0.75  ? ?Acute metabolic encephalopathy ?Paranoid schizophrenia  ?Schizophrenia ?-Medical management per psychiatry. ?-Continue benztropine, hydroxyzine, Risperdal, trazodone as needed ?-Acute alteration in mental status due to hyponatremia and underlying psych issues ?-Mental status improving. ? ?Permanent A-fib with RVR ?-Cardiology consult obtained.  Currently on metoprolol, digoxin. ?-CHA2DS2-VASc score of 1.  Not on anticoagulation because of noncompliance, episodes of aggressive behavior. ?-Echo on 01/24/2022 with EF 45 to 50%, global hypokinesis ? ?Hyperlipidemia ?-Fasting lipid panel, LDL 167 ?-On Lipitor 40 mg daily, also on aspirin 81 mg daily. ?   ?Goals of care ?  Code Status: Full Code  ? ?Mobility: Encourage ambulation ? ?Nutritional status:  ?Body mass index is 29.18 kg/m?.  ?  ?  ? ? ? ? ?Diet:  ?Diet Order   ? ?       ?  Diet regular Room service appropriate? Yes; Fluid consistency: Thin  Diet effective now       ?  ? ?  ?  ? ?  ? ? ?DVT prophylaxis:  ?enoxaparin (LOVENOX) injection 40 mg Start: 01/24/22 1200 ?SCDs Start: 01/24/22 0733 ?  ?Antimicrobials: None ?Fluid: LR at 50 mill per hour ?Consultants: Psychiatry ?Family Communication: None at bedside ? ?Status is: Inpatient ? ?Continue in-hospital care because: Pending psych recommendation ?Level of care: Telemetry Cardiac  ? ?Dispo: The patient is from: Home ?             Anticipated d/c is to: Pending clinical course ?             Patient currently is not medically stable to  d/c. ?  Difficult to place patient No ? ? ? ? ?Infusions:  ? lactated ringers 50 mL/hr at 01/27/22 0640  ? ? ?Scheduled Meds: ? aspirin EC  81 mg Oral Daily  ? atorvastatin  40 mg Oral Daily  ? digoxin  0.25 mg Oral Daily  ? enoxaparin (LOVENOX) injection  40 mg Subcutaneous Q24H  ? metoprolol tartrate  25 mg Oral BID  ? polyethylene glycol  34 g Oral Once  ? risperiDONE  1 mg Oral BID  ? ? ?PRN meds: ?acetaminophen, benztropine, hydrOXYzine,  menthol-cetylpyridinium, ondansetron (ZOFRAN) IV, traZODone  ? ?Antimicrobials: ?Anti-infectives (From admission, onward)  ? ? None  ? ?  ? ? ?Objective: ?Vitals:  ? 01/27/22 0953 01/27/22 1254  ?BP: 109/65   ?Pulse: 82 100  ?Resp:    ?Temp:    ?SpO2:    ? ? ?Intake/Output Summary (Last 24 hours) at 01/27/2022 1437 ?Last data filed at 01/27/2022 0400 ?Gross per 24 hour  ?Intake 479.77 ml  ?Output 900 ml  ?Net -420.23 ml  ? ?Filed Weights  ? 01/23/22 2113  ?Weight: 77.1 kg  ? ?Weight change:  ?Body mass index is 29.18 kg/m?.  ? ?Physical Exam: ?General exam: Pleasant, young African-American female.  Not in physical distress ?Skin: No rashes, lesions or ulcers. ?HEENT: Atraumatic, normocephalic, no obvious bleeding ?Lungs: Clear to auscultation bilaterally ?CVS: Regular rate and rhythm, no murmur ?GI/Abd soft, nontender, nondistended, bowel sound present ?CNS: Alert, awake, oriented to place and person ?Psychiatry: Mood appropriate ?Extremities: No pedal edema, no calf tenderness ? ?Data Review: I have personally reviewed the laboratory data and studies available. ? ?F/u labs ordered ?Unresulted Labs (From admission, onward)  ? ?  Start     Ordered  ? 01/28/22 [id number]  Basic metabolic panel  Tomorrow morning,   R       ?Question:  Specimen collection method  Answer:  Lab=Lab collect  ? 01/27/22 1437  ? 01/27/22 0500  CBC  Daily,   R     ?Question:  Specimen collection method  Answer:  Lab=Lab collect  ? 01/26/22 1401  ? ?  ?  ? ?  ? ? ?Signed, ?Terrilee Croak, MD ?[facility] ?01/27/2022 ? ? ? ? ? ? ? ? ? ? ? ? ?

## 2022-01-27 NOTE — Consult Note (Signed)
[facility] Face-to-Face Psychiatry Consult  ? ?Reason for Consult:  Paranoid schizophrenia  ?Referring Physician:  Ulyess Mort ?Patient Identification: Kathy Brown ?MRN:  [id number] ?Principal Diagnosis: Hypernatremia ?Diagnosis:  Principal Problem: ?  Hypernatremia ?Active Problems: ?  Paranoid schizophrenia (HCC) ?  A-fib (HCC) ?  AKI (acute kidney injury) (HCC) ?  Rapid atrial fibrillation (HCC) ? ? ?Total Time spent with patient: 15 minutes ? ?Subjective:   ?Kathy Brown is a 41 y.o. female was seen and evaluated face-to-face.  She is awake, alert and remains oriented to self and place. Endorses vague visual hallucinations (blurry objects in front of her) but denied recent AH, SI, HI. Endorses some tooth pain. Denies any side effects from risperidone and notes she thinks it has worked well for her in the past. She asks for her mother's phone number which is given to her. ? ?Chart reviewed.  Patient has a history with schizophrenia and was  restarted on Risperdal 1 mg p.o. twice daily.  She reports taking and tolerating medications well. Denying  medication side effects.  RN reports patient has been taking medication by mouth. Psychiatry to continue to follow.  Support,  encouragement and reassurance was provided.  ? ?HPI:" Kathy Brown is a 41 y.o. female admitted medically for 01/23/2022 for agitation/aggression towards family and not taking her medications. She carries the psychiatric diagnoses of paranoid schizophrenia and has a past medical history of  intellectual disability, atrial fibrillation. Psychiatry was consulted for Schizophrenia and medication management by Carollee Herter MD."  ? ?Past Psychiatric History:  ? ?Risk to Self:   ?Risk to Others:   ?Prior Inpatient Therapy:   ?Prior Outpatient Therapy:   ? ?Past Medical History:  ?Past Medical History:  ?Diagnosis Date  ? A-fib (HCC)   ? Anxiety   ? Atrial fibrillation with RVR (HCC) 10/15/2017  ? Auditory hallucinations   ? Chronic back pain   ?  Dysrhythmia   ? Atrial fibrillation  ? Paranoid schizophrenia (HCC)   ? Psychosis (HCC) 03/25/2014  ? Schizoaffective disorder (HCC) 03/25/2014  ? Schizophrenia (HCC)   ? Urinary incontinence   ?  ?Past Surgical History:  ?Procedure Laterality Date  ? TOOTH EXTRACTION Bilateral 09/08/2018  ? Procedure: DENTAL RESTORATION/EXTRACTIONS;  Surgeon: Ocie Doyne, DDS;  Location: [facility];  Service: Oral Surgery;  Laterality: Bilateral;  ? ?Family History:  ?Family History  ?Problem Relation Age of Onset  ? Hypertension Mother   ? Diabetes Mother   ? ?Family Psychiatric  History:  ?Social History:  ?Social History  ? ?Substance and Sexual Activity  ?Alcohol Use No  ?   ?Social History  ? ?Substance and Sexual Activity  ?Drug Use No  ?  ?Social History  ? ?Socioeconomic History  ? Marital status: Single  ?  Spouse name: Not on file  ? Number of children: Not on file  ? Years of education: Not on file  ? Highest education level: Not on file  ?Occupational History  ? Not on file  ?Tobacco Use  ? Smoking status: Never  ? Smokeless tobacco: Never  ?Vaping Use  ? Vaping Use: Never used  ?Substance and Sexual Activity  ? Alcohol use: No  ? Drug use: No  ? Sexual activity: Not Currently  ?  Birth control/protection: None  ?Other Topics Concern  ? Not on file  ?Social History Narrative  ? Lives mom, dad, grandmother, and three kids  ? ?Social Determinants of Health  ? ?Financial Resource Strain: Not on file  ?  Food Insecurity: Not on file  ?Transportation Needs: Not on file  ?Physical Activity: Not on file  ?Stress: Not on file  ?Social Connections: Not on file  ? ?Additional Social History: ?  ? ?Allergies:  No Known Allergies ? ?Labs:  ?Results for orders placed or performed during the hospital encounter of 01/23/22 (from the past 48 hour(s))  ?CBC     Status: Abnormal  ? Collection Time: 01/26/22 12:28 PM  ?Result Value Ref Range  ? WBC 4.0 4.0 - 10.5 K/uL  ? RBC 3.93 3.87 - 5.11 MIL/uL  ? Hemoglobin 11.2 (L) 12.0  - 15.0 g/dL  ? HCT 34.9 (L) 36.0 - 46.0 %  ? MCV 88.8 80.0 - 100.0 fL  ? MCH 28.5 26.0 - 34.0 pg  ? MCHC 32.1 30.0 - 36.0 g/dL  ? RDW 15.9 (H) 11.5 - 15.5 %  ? Platelets 113 (L) 150 - 400 K/uL  ?  Comment: Immature Platelet Fraction may be ?clinically indicated, consider ?ordering this additional test ?[id number] ?CONSISTENT WITH PREVIOUS RESULT ?REPEATED TO VERIFY ?  ? nRBC 0.0 0.0 - 0.2 %  ?  Comment: Performed at [facility], [address], [place], [place] [address]  ?Basic metabolic panel     Status: Abnormal  ? Collection Time: 01/26/22 12:28 PM  ?Result Value Ref Range  ? Sodium 137 135 - 145 mmol/L  ? Potassium 4.1 3.5 - 5.1 mmol/L  ? Chloride 104 98 - 111 mmol/L  ? CO2 29 22 - 32 mmol/L  ? Glucose, Bld 85 70 - 99 mg/dL  ?  Comment: Glucose reference range applies only to samples taken after fasting for at least 8 hours.  ? BUN 18 6 - 20 mg/dL  ? Creatinine, Ser 0.72 0.44 - 1.00 mg/dL  ? Calcium 8.8 (L) 8.9 - 10.3 mg/dL  ? GFR, Estimated >60 >60 mL/min  ?  Comment: (NOTE) ?Calculated using the CKD-EPI Creatinine Equation (2021) ?  ? Anion gap 4 (L) 5 - 15  ?  Comment: Performed at [facility], [address] [place], [place] [address]  ?CBC     Status: Abnormal  ? Collection Time: 01/27/22  1:49 AM  ?Result Value Ref Range  ? WBC 3.9 (L) 4.0 - 10.5 K/uL  ? RBC 3.64 (L) 3.87 - 5.11 MIL/uL  ? Hemoglobin 10.2 (L) 12.0 - 15.0 g/dL  ? HCT 32.4 (L) 36.0 - 46.0 %  ? MCV 89.0 80.0 - 100.0 fL  ? MCH 28.0 26.0 - 34.0 pg  ? MCHC 31.5 30.0 - 36.0 g/dL  ? RDW 16.0 (H) 11.5 - 15.5 %  ? Platelets 116 (L) 150 - 400 K/uL  ?  Comment: Immature Platelet Fraction may be ?clinically indicated, consider ?ordering this additional test ?[id number] ?CONSISTENT WITH PREVIOUS RESULT ?REPEATED TO VERIFY ?  ? nRBC 0.0 0.0 - 0.2 %  ?  Comment: Performed at [facility], [address], [place], [place] [address]  ?Brain natriuretic peptide     Status: None  ? Collection Time: 01/27/22  1:49 AM  ?Result Value Ref Range   ? B Natriuretic Peptide 24.5 0.0 - 100.0 pg/mL  ?  Comment: Performed at [facility], [address] [place], [place] [address]  ?Basic metabolic panel     Status: Abnormal  ? Collection Time: 01/27/22  5:46 AM  ?Result Value Ref Range  ? Sodium 138 135 - 145 mmol/L  ? Potassium 4.2 3.5 - 5.1  mmol/L  ? Chloride 104 98 - 111 mmol/L  ? CO2 31 22 - 32 mmol/L  ? Glucose, Bld 93 70 - 99 mg/dL  ?  Comment: Glucose reference range applies only to samples taken after fasting for at least 8 hours.  ? BUN 15 6 - 20 mg/dL  ? Creatinine, Ser 0.75 0.44 - 1.00 mg/dL  ? Calcium 9.0 8.9 - 10.3 mg/dL  ? GFR, Estimated >60 >60 mL/min  ?  Comment: (NOTE) ?Calculated using the CKD-EPI Creatinine Equation (2021) ?  ? Anion gap 3 (L) 5 - 15  ?  Comment: Performed at [facility], [address] [place], [place] [address]  ? ? ?Current Facility-Administered Medications  ?Medication Dose Route Frequency Provider Last Rate Last Admin  ? acetaminophen (TYLENOL) tablet 650 mg  650 mg Oral Q4H PRN Carollee Herterhen, Eric, DO   650 mg at 01/25/22 0335  ? aspirin EC tablet 81 mg  81 mg Oral Daily Carollee HerterChen, Eric, DO   81 mg at 01/27/22 [id number]  ? atorvastatin (LIPITOR) tablet 40 mg  40 mg Oral Daily Dow AdolphHall, Carole N, DO   40 mg at 01/27/22 [id number]  ? benztropine (COGENTIN) tablet 0.5 mg  0.5 mg Oral BID PRN Carollee Herterhen, Eric, DO      ? digoxin (LANOXIN) tablet 0.25 mg  0.25 mg Oral Daily Wilmot, Kobina A, MD   0.25 mg at 01/27/22 0953  ? enoxaparin (LOVENOX) injection 40 mg  40 mg Subcutaneous Q24H Dow AdolphHall, Carole N, DO   40 mg at 01/27/22 1223  ? hydrOXYzine (ATARAX) tablet 25 mg  25 mg Oral TID PRN Carollee Herterhen, Eric, DO   25 mg at 01/25/22 2102  ? menthol-cetylpyridinium (CEPACOL) lozenge 3 mg  1 lozenge Oral PRN Eduard ClosKakrakandy, Arshad N, MD   3 mg at 01/27/22 1111  ? metoprolol tartrate (LOPRESSOR) tablet 25 mg  25 mg Oral BID Pricilla Riffleoss, Paula V, MD   25 mg at 01/27/22 [id number]  ? ondansetron (ZOFRAN) injection 4 mg  4 mg Intravenous Q6H PRN Carollee Herterhen, Eric, DO   4 mg at 01/25/22 1647   ? polyethylene glycol (MIRALAX / GLYCOLAX) packet 34 g  34 g Oral Once Carollee Herterhen, Eric, DO      ? risperiDONE (RISPERDAL) tablet 1 mg  1 mg Oral BID Carollee Herterhen, Eric, DO   1 mg at 01/27/22 [id number]  ? traZODone (DESYREL) tab

## 2022-01-28 DIAGNOSIS — N179 Acute kidney failure, unspecified: Secondary | ICD-10-CM | POA: Diagnosis not present

## 2022-01-28 DIAGNOSIS — F200 Paranoid schizophrenia: Secondary | ICD-10-CM | POA: Diagnosis not present

## 2022-01-28 DIAGNOSIS — I482 Chronic atrial fibrillation, unspecified: Secondary | ICD-10-CM | POA: Diagnosis not present

## 2022-01-28 DIAGNOSIS — E870 Hyperosmolality and hypernatremia: Secondary | ICD-10-CM | POA: Diagnosis not present

## 2022-01-28 LAB — CBC
HCT: 32.6 % — ABNORMAL LOW (ref 36.0–46.0)
Hemoglobin: 10.2 g/dL — ABNORMAL LOW (ref 12.0–15.0)
MCH: 28 pg (ref 26.0–34.0)
MCHC: 31.3 g/dL (ref 30.0–36.0)
MCV: 89.6 fL (ref 80.0–100.0)
Platelets: 130 10*3/uL — ABNORMAL LOW (ref 150–400)
RBC: 3.64 MIL/uL — ABNORMAL LOW (ref 3.87–5.11)
RDW: 16.5 % — ABNORMAL HIGH (ref 11.5–15.5)
WBC: 4.7 10*3/uL (ref 4.0–10.5)
nRBC: 0 % (ref 0.0–0.2)

## 2022-01-28 LAB — BASIC METABOLIC PANEL
Anion gap: 5 (ref 5–15)
BUN: 14 mg/dL (ref 6–20)
CO2: 30 mmol/L (ref 22–32)
Calcium: 8.7 mg/dL — ABNORMAL LOW (ref 8.9–10.3)
Chloride: 105 mmol/L (ref 98–111)
Creatinine, Ser: 0.72 mg/dL (ref 0.44–1.00)
GFR, Estimated: >60 mL/min (ref >60)
Glucose, Bld: 85 mg/dL (ref 70–99)
Potassium: 4.2 mmol/L (ref 3.5–5.1)
Sodium: 140 mmol/L (ref 135–145)

## 2022-01-28 LAB — DIGOXIN LEVEL: Digoxin Level: 1.2 ng/mL (ref 0.8–2.0)

## 2022-01-28 MED ORDER — POLYETHYLENE GLYCOL 3350 17 G PO PACK
17.0000 g | PACK | Freq: Every day | ORAL | Status: DC
  Administered 2022-01-29 – 2022-01-31 (×2): 17 g via ORAL
  Filled 2022-01-28 (×2): qty 1

## 2022-01-28 MED ORDER — MAGNESIUM HYDROXIDE 400 MG/5ML PO SUSP
30.0000 mL | Freq: Every day | ORAL | Status: DC | PRN
  Administered 2022-01-28: 30 mL via ORAL
  Filled 2022-01-28: qty 30

## 2022-01-28 MED ORDER — BISACODYL 10 MG RE SUPP
10.0000 mg | Freq: Once | RECTAL | Status: AC
  Administered 2022-01-28: 10 mg via RECTAL
  Filled 2022-01-28: qty 1

## 2022-01-28 MED ORDER — METOPROLOL TARTRATE 12.5 MG HALF TABLET
12.5000 mg | ORAL_TABLET | Freq: Two times a day (BID) | ORAL | Status: DC
  Administered 2022-01-28 – 2022-01-29 (×4): 12.5 mg via ORAL
  Filled 2022-01-28 (×4): qty 1

## 2022-01-28 NOTE — Progress Notes (Signed)
Pt took her cardiac monitor off and she is refusing to put leads back on. ? ?Hinton Dyer, RN ?

## 2022-01-28 NOTE — Progress Notes (Addendum)
? ?Progress Note ? ?Patient Name: Kathy Brown ?Date of Encounter: 01/28/2022 ? ?[facility] Cardiologist: Thurmon Fair, MD  ? ?Subjective  ? ?Upon entering the room, patient is tearful, scared, and crying to see her parents. Student at bedside comforting.  ? ?Inpatient Medications  ?  ?Scheduled Meds: ? aspirin EC  81 mg Oral Daily  ? atorvastatin  40 mg Oral Daily  ? digoxin  0.25 mg Oral Daily  ? enoxaparin (LOVENOX) injection  40 mg Subcutaneous Q24H  ? metoprolol tartrate  12.5 mg Oral BID  ? polyethylene glycol  34 g Oral Once  ? risperiDONE  1 mg Oral BID  ? ?Continuous Infusions: ? ?PRN Meds: ?acetaminophen, benztropine, hydrOXYzine, magnesium hydroxide, menthol-cetylpyridinium, ondansetron (ZOFRAN) IV, traZODone  ? ?Vital Signs  ?  ?Vitals:  ? 01/27/22 2228 01/27/22 2339 01/28/22 0488 01/28/22 0752  ?BP: 98/63 98/63  105/74  ?Pulse: 80 80  69  ?Resp: 16 16  14   ?Temp: 98 ?F (36.7 ?C) 98 ?F (36.7 ?C)  97.6 ?F (36.4 ?C)  ?TempSrc: Oral   Oral  ?SpO2:      ?Weight:   77.3 kg   ?Height:      ? ? ?Intake/Output Summary (Last 24 hours) at 01/28/2022 0951 ?Last data filed at 01/28/2022 [phone] ?Gross per 24 hour  ?Intake 1359.01 ml  ?Output --  ?Net 1359.01 ml  ? ? ?  01/28/2022  ?  4:12 AM 01/23/2022  ?  9:13 PM 10/05/2021  ?  6:17 PM  ?Last 3 Weights  ?Weight (lbs) 170 lb 6.7 oz 169 lb 15.6 oz 170 lb  ?Weight (kg) 77.3 kg 77.1 kg 77.111 kg  ?   ? ?Telemetry  ?  ?Afib with resting rates in the 60-70s,  - Personally Reviewed ? ?ECG  ?  ?No new tracings - Personally Reviewed ? ?Physical Exam  ? ?GEN: crying, anxious ?Deferred full exam ?Neuro:  Nonfocal  ?Psych: crying, anxious  ? ?Labs  ?  ?High Sensitivity Troponin:  No results for input(s): TROPONINIHS in the last 720 hours.   ?Chemistry ?Recent Labs  ?Lab 01/23/22 ?2025 01/24/22 ?01/26/22 01/24/22 ?01/26/22 01/24/22 ?1406 01/25/22 ?0310 01/26/22 ?1228 01/27/22 ?03/29/22 01/28/22 ?0252  ?NA 155* 142 141   < > 139 137 138 140  ?K 3.9 3.2* 3.3*   < > 3.6 4.1 4.2 4.2  ?CL 119*  109 107  --  107 104 104 105  ?CO2 26 28 27   --  28 29 31 30   ?GLUCOSE 121* 165* 196*  --  114* 85 93 85  ?BUN 46* 42* 42*  --  23* 18 15 14   ?CREATININE 1.62* 0.96 1.13*  --  1.02* 0.72 0.75 0.72  ?CALCIUM 10.9* 9.1 9.3  --  8.8* 8.8* 9.0 8.7*  ?MG  --   --  2.3  --   --   --   --   --   ?PROT 8.7* 6.4*  --   --  6.0*  --   --   --   ?ALBUMIN 4.9 3.6  --   --  3.1*  --   --   --   ?AST 26 19  --   --  14*  --   --   --   ?ALT 17 15  --   --  13  --   --   --   ?ALKPHOS 34* 26*  --   --  31*  --   --   --   ?  BILITOT 1.3* 1.4*  --   --  0.8  --   --   --   ?GFRNONAA 41* >60 >60  --  >60 >60 >60 >60  ?ANIONGAP 10 5 7   --  4* 4* 3* 5  ? < > = values in this interval not displayed.  ?  ?Lipids  ?Recent Labs  ?Lab 01/25/22 ?0310  ?CHOL 215*  ?TRIG 112  ?HDL 26*  ?LDLCALC 167*  ?CHOLHDL 8.3  ?  ?Hematology ?Recent Labs  ?Lab 01/26/22 ?1228 01/27/22 ?0149 01/28/22 ?0252  ?WBC 4.0 3.9* 4.7  ?RBC 3.93 3.64* 3.64*  ?HGB 11.2* 10.2* 10.2*  ?HCT 34.9* 32.4* 32.6*  ?MCV 88.8 89.0 89.6  ?MCH 28.5 28.0 28.0  ?MCHC 32.1 31.5 31.3  ?RDW 15.9* 16.0* 16.5*  ?PLT 113* 116* 130*  ? ?Thyroid  ?Recent Labs  ?Lab 01/24/22 ?01/26/22  ?TSH 0.584  ?  ?BNP ?Recent Labs  ?Lab 01/27/22 ?0149  ?BNP 24.5  ?  ?DDimer No results for input(s): DDIMER in the last 168 hours.  ? ?Radiology  ?  ?DG Abd 1 View ? ?Result Date: 01/26/2022 ?CLINICAL DATA:  Abdominal pain EXAM: ABDOMEN - 1 VIEW COMPARISON:  CT 01/08/2021 FINDINGS: The bowel gas pattern is normal. Scattered stool throughout the colon. No radio-opaque calculi or other significant radiographic abnormality are seen. IMPRESSION: Normal nonobstructive bowel gas pattern with stool-filled colon. Electronically Signed   By: 01/10/2021 M.D.   On: 01/26/2022 22:33   ? ?Cardiac Studies  ? ?Echo 01/24/22: ?1. Left ventricular ejection fraction, by estimation, is 45 to 50%. The  ?left ventricle has mildly decreased function. The left ventricle  ?demonstrates global hypokinesis. Left ventricular diastolic  function could  ?not be evaluated.  ? 2. Right ventricular systolic function is normal. The right ventricular  ?size is normal. There is normal pulmonary artery systolic pressure.  ? 3. The mitral valve is normal in structure. Trivial mitral valve  ?regurgitation. No evidence of mitral stenosis.  ? 4. The aortic valve is tricuspid. There is mild calcification of the  ?aortic valve. Aortic valve regurgitation is not visualized. No aortic  ?stenosis is present.  ? 5. The inferior vena cava is normal in size with <50% respiratory  ?variability, suggesting right atrial pressure of 8 mmHg.  ? ?Patient Profile  ?   ?41 y.o. female with a hx of persistent Afib and mental health challenges including schizophrenia, hx of psychosis, PTSD, and anxiety who is followed for Afib  ? ?Assessment & Plan  ?  ?Permanent atrial fibrillation ?Afib RVR ?Hypotension ?Not a candidate for antiarrhythmic therapy. Rate control has been difficult due to hypotension. Continue metoprolol and digoxin. Will need to follow digoxin levels closely - today is day 4 of digoxin following IV load  ?Rates generally in the 60-70s while sleeping, but in the 140s with activity. Dig level 1.2 ?Deemed not an anticoagulation candidate given noncompliance and history of violence. Therefore, not an ablation candidate.  ?May need to consider a higher dose of metoprolol in the mornings with addition of midodrine. Rate control is good in the evenings.  ? ? ?HFmrEF ?LVEF 45-50% ?Suspect tachycardia mediated ?GDMT difficult given BP.  ?   ? ?For questions or updates, please contact [facility] ?Please consult [email or web address] for contact info under  ? ?  ?   ?Signed, ?20, PA  ?01/28/2022, 9:51 AM   ? ? ?Personally seen and examined. Agree with APP above with the following comments: ?Briefly ID,  paranoid schizophrenia AFA RVR ?Patient notes no symptoms, parents were not in the room at this time.  She walked with nursing and she has done well ?Exam  notable for IRIR heart rate; she is rate controlled ?Labs notable for dig level 1.2 (additions made to note above) ?Personally reviewed relevant tests; she is no longer on telemetry because it is uncomfortable for her ?Would recommend  ?- see 01/27/22 in regards to medications that she is unable to take for AF and HF ?- we will continue her digoxin; she is asymptomatic at higher heart rates ?- if AM heart rates again elevated will increase metoprolol AM dose to 25 mg and add midodrine 5mg  in AM ? ?Riley LamMahesh Cristin Szatkowski, MD ?Cardiologist ?[facility]  [facility]  ?[address], [place] ?[place], [place] [address] ?[phone]  ?12:03 PM ? ? ? ?

## 2022-01-28 NOTE — Progress Notes (Signed)
?PROGRESS NOTE ? ?Kathy Brown  ?DOB: 13-Aug-1981  ?PCP: Pcp, No ?[id number]  ?DOA: 01/23/2022 ? LOS: 4 days  ?Hospital Day: 6 ? ?Brief narrative: ?Kathy Brown is a 41 y.o. female with PMH significant for paranoid schizophrenia, schizoaffective disorder, anxiety, intellectual disability, permanent A-fib not on oral anticoagulation, history of aggressive behavior who was brought to the ED on 3/30 from behavioral health urgent care.   ?Patient reportedly stopped eating, drinking and taking her meds 2 weeks prior.  She was exhibiting outburst of crying, screaming and talking to herself.  She was being aggressive toward her parents and not taking her medications.   ?In the ED, patient was hypertensive, hyponatremic with sodium level significantly up at 155, AKI.   ?Admitted to hospitalist service.  Psychiatry consulted.   ?Hospital course also complicated by A-fib with RVR for which cardiology was consulted.  ?See below for details. ? ?Subjective: ?Sitting up on the side of bed, tearful appears anxious ?Says that she had a nightmare overnight ?Repeatedly says that she is very scared, but does not elaborate further ? ?Principal Problem: ?  Hypernatremia ?Active Problems: ?  AKI (acute kidney injury) ([place]) ?  Paranoid schizophrenia ([place]) ?  A-fib ([place]) ?  Rapid atrial fibrillation ([place]) ?  ?Assessment and Plan: ?Resolved hypovolemic hypernatremia ?-Presented with dehydration from not eating or drinking, serum sodium level elevated to 155.  Improved to normal with hypotonic IV fluid.   ?-Currently off IV fluid.  Encourage oral hydration.   ?Recent Labs  ?Lab 01/23/22 ?2025 01/24/22 ?[id number] 01/24/22 ?[id number] 01/24/22 ?1406 01/25/22 ?0310 01/26/22 ?1228 01/27/22 ?[id number] 01/28/22 ?0252  ?NA 155* 142 141 139 139 137 138 140  ? ?AKI ?-Creatinine was elevated to 1.62 because of dehydration.  Improved with IV fluid. ?Recent Labs  ?  10/05/21 ?2015 01/23/22 ?2025 01/24/22 ?[id number] 01/24/22 ?[id number] 01/25/22 ?0310 01/26/22 ?1228  01/27/22 ?[id number] 01/28/22 ?0252  ?BUN 19 46* 42* 42* 23* 18 15 14   ?CREATININE 0.93 1.62* 0.96 1.13* 1.02* 0.72 0.75 0.72  ? ?Acute metabolic encephalopathy ?Paranoid schizophrenia  ?Schizophrenia ?-Medical management per psychiatry. ?-Continue benztropine, hydroxyzine, Risperdal, trazodone as needed ?-Acute alteration in mental status due to hyponatremia and underlying psych issues ?-Mental status improving. ? ?Permanent A-fib with RVR ?-Cardiology consult obtained.  Currently on metoprolol, digoxin. ?-CHA2DS2-VASc score of 1.  Not on anticoagulation because of noncompliance, episodes of aggressive behavior. ?-Echo on 01/24/2022 with EF 45 to 50%, global hypokinesis ? ?Hyperlipidemia ?-Fasting lipid panel, LDL 167 ?-On Lipitor 40 mg daily, also on aspirin 81 mg daily. ? ?Constipation ?-Staff reports no stools for several days ?-Started on MiraLAX ?-Give Dulcolax suppository ?   ?Goals of care ?  Code Status: Full Code  ? ?Mobility: Encourage ambulation ? ?Nutritional status:  ?Body mass index is 29.25 kg/m?.  ?  ?  ? ? ? ? ?Diet:  ?Diet Order   ? ?       ?  Diet regular Room service appropriate? Yes; Fluid consistency: Thin  Diet effective now       ?  ? ?  ?  ? ?  ? ? ?DVT prophylaxis:  ?enoxaparin (LOVENOX) injection 40 mg Start: 01/24/22 1200 ?SCDs Start: 01/24/22 0733 ?  ?Antimicrobials: None ?Fluid:  ?Consultants: Psychiatry, cardiology ?Family Communication: None at bedside ? ?Status is: Inpatient ? ?Continue in-hospital care because: Pending psych recommendation ?Level of care: Telemetry Cardiac  ? ?Dispo: The patient is from: Home ?  Anticipated d/c is to: Pending clinical course ?             Patient currently is not medically stable to d/c. ?  Difficult to place patient No ? ? ? ? ?Infusions:  ? ? ? ?Scheduled Meds: ? aspirin EC  81 mg Oral Daily  ? atorvastatin  40 mg Oral Daily  ? digoxin  0.25 mg Oral Daily  ? enoxaparin (LOVENOX) injection  40 mg Subcutaneous Q24H  ? metoprolol tartrate  12.5  mg Oral BID  ? polyethylene glycol  17 g Oral Daily  ? risperiDONE  1 mg Oral BID  ? ? ?PRN meds: ?acetaminophen, benztropine, hydrOXYzine, magnesium hydroxide, menthol-cetylpyridinium, ondansetron (ZOFRAN) IV, traZODone  ? ?Antimicrobials: ?Anti-infectives (From admission, onward)  ? ? None  ? ?  ? ? ?Objective: ?Vitals:  ? 01/28/22 1159 01/28/22 1431  ?BP: 99/64   ?Pulse: 72   ?Resp: 16   ?Temp: 98.1 ?F (36.7 ?C)   ?SpO2:  100%  ? ? ?Intake/Output Summary (Last 24 hours) at 01/28/2022 2049 ?Last data filed at 01/28/2022 [id number] ?Gross per 24 hour  ?Intake 600 ml  ?Output --  ?Net 600 ml  ? ?Filed Weights  ? 01/23/22 2113 01/28/22 0412  ?Weight: 77.1 kg 77.3 kg  ? ?Weight change:  ?Body mass index is 29.25 kg/m?.  ? ?Physical Exam: ?General exam: Alert, awake, sitting up on side of the bed ?Respiratory system: Clear to auscultation. Respiratory effort normal. ?Cardiovascular system: Irregular. No murmurs, rubs, gallops. ?Gastrointestinal system: Abdomen is nondistended, soft and nontender. No organomegaly or masses felt. Normal bowel sounds heard. ?Central nervous system: Alert and oriented. No focal neurological deficits. ?Extremities: No C/C/E, +pedal pulses ?Skin: No rashes, lesions or ulcers ?Psychiatry: She is tearful, appears to be very anxious ? ? ?Data Review: I have personally reviewed the laboratory data and studies available. ? ?F/u labs ordered ?Unresulted Labs (From admission, onward)  ? ?  Start     Ordered  ? 01/27/22 0500  CBC  Daily,   R     ?Question:  Specimen collection method  Answer:  Lab=Lab collect  ? 01/26/22 1401  ? ?  ?  ? ?  ? ? ?Signed, ?Kathie Dike, MD ?[facility] ?01/28/2022 ? ? ? ? ? ? ? ? ? ? ? ? ?

## 2022-01-28 NOTE — Care Management Important Message (Signed)
Important Message ? ?Patient Details  ?Name: Kathy Brown ?MRN: [id number] ?Date of Birth: 24-Nov-1980 ? ? ?Medicare Important Message Given:  Yes ? ? ? ? ?Renie Ora ?01/28/2022, 8:18 AM ?

## 2022-01-28 NOTE — Progress Notes (Signed)
Mobility Specialist Progress Note  ? ? 01/28/22 1630  ?Mobility  ?Activity Ambulated with assistance in hallway  ?Level of Assistance Contact guard assist, steadying assist  ?Assistive Device Other (Comment) ?(HHA)  ?Distance Ambulated (ft) 360 ft  ?Activity Response Tolerated well  ?$Mobility charge 1 Mobility  ? ?Pre-Mobility: 79 HR, 101/57 BP ?Post-Mobility: 97 HR ? ?Pt received in bed and agreeable to mobility. C/o stomach pain they rated 10/10 during mobility. Pt to BR to attempt BM after session. Advised pt to pull string when ready and RN and NT aware. ? ?[place] ?Mobility Specialist  ?

## 2022-01-29 DIAGNOSIS — E870 Hyperosmolality and hypernatremia: Secondary | ICD-10-CM | POA: Diagnosis not present

## 2022-01-29 NOTE — Progress Notes (Signed)
?  Progress Note ? ? ?Patient: Kathy Brown NOI:[id number] DOB: 1981-02-17 DOA: 01/23/2022     Hospitalization day: 5 ?DOS: the patient was seen and examined on 01/29/2022 ? ?Brief hospital course: ?Kathy Brown is a 41 y.o. female with PMH significant for paranoid schizophrenia, schizoaffective disorder, anxiety, intellectual disability, permanent A-fib not on oral anticoagulation, history of aggressive behavior who was brought to the ED on 3/30 from behavioral health urgent care.   ?Patient reportedly stopped eating, drinking and taking her meds 2 weeks prior.  She was exhibiting outburst of crying, screaming and talking to herself.  She was being aggressive toward her parents and not taking her medications.   ?In the ED, patient was hypertensive, hyponatremic with sodium level significantly up at 155, AKI.   ?Admitted to hospitalist service.  Psychiatry consulted.   ?Hospital course also complicated by A-fib with RVR for which cardiology was consulted.  ?See below for details. ? ?Assessment and Plan: ?*Hypovolemic hypernatremia ?Now resolved. ?Treated with IV fluids. ?Continue to encourage oral hydration. ? ?AKI (acute kidney injury) (HCC) ?From poor p.o. intake. ?Serum creatinine was elevated to 1.6 on admission ?Now normal w.  Ith IVF. Hold nephrotoxic drugs. ? ?Acute metabolic encephalopathy ?Paranoid schizophrenia  ?Schizophrenia ?-Medical management per psychiatry. ?-Continue benztropine, hydroxyzine, Risperdal, trazodone as needed ?-Acute alteration in mental status due to hyponatremia and underlying psych issues ?-Mental status improving. ?  ?Permanent A-fib with RVR ?-Cardiology consult obtained.  Currently on metoprolol, digoxin. ?-CHA2DS2-VASc score of 1.  Not on anticoagulation because of noncompliance, episodes of aggressive behavior. ?-Echo on 01/24/2022 with EF 45 to 50%, global hypokinesis ?  ?Hyperlipidemia ?-Fasting lipid panel, LDL 167 ?-On Lipitor 40 mg daily, also on aspirin 81 mg daily. ?   ?Constipation ?-Staff reports no stools for several days ?-Started on MiraLAX ?-Give Dulcolax suppository ? ?Subjective: No nausea no vomiting no fever no chills. ? ?Physical Exam: ?Vitals:  ? 01/29/22 0616 01/29/22 0755 01/29/22 1114 01/29/22 2008  ?BP: 101/67 103/71 (!) 104/59 97/77  ?Pulse:  67 70 72  ?Resp: 11 15 16 17   ?Temp: (!) 97.4 ?F (36.3 ?C) (!) 97.5 ?F (36.4 ?C) (!) 97.2 ?F (36.2 ?C) 97.9 ?F (36.6 ?C)  ?TempSrc: Oral Oral Oral Oral  ?SpO2:  99% 99% 99%  ?Weight:      ?Height:      ? ?General: Appear in mild distress; no visible Abnormal Neck Mass Or lumps, Conjunctiva normal ?Cardiovascular: S1 and S2 Present, no Murmur, ?Respiratory: good respiratory effort, Bilateral Air entry present and CTA, no Crackles, no wheezes ?Abdomen: Bowel Sound present,  ?Extremities: no Pedal edema ?Neurology: alert and oriented to self ?Gait not checked due to patient safety concerns  ? ?Data Reviewed: ?I have Reviewed nursing notes, Vitals, and Lab results since pt's last encounter. Pertinent lab results CBC and BMP ?I have ordered test including CBC and BMP ?I have discussed pt's care plan and test results with psychiatric.  ? ?Family Communication: None at bedside ? ?Disposition: ?Status is: Inpatient ?Remains inpatient appropriate because: Ongoing issues with RVR and medication management for psychiatric requiring close observation. ? ?Author: ? , MD ?01/29/2022 8:14 PM ? ?For on call review [email or web address]. ?

## 2022-01-29 NOTE — Progress Notes (Signed)
? ?Progress Note ? ?Patient Name: Kathy Brown ?Date of Encounter: 01/29/2022 ? ?[facility] Cardiologist: Thurmon Fair, MD  ? ?Subjective  ? ?Nonverbal today.  Nodes her head yes or no to respond to questions. ?No chest pain, palpitations.  SOB.  She is hungry. ? ?Inpatient Medications  ?  ?Scheduled Meds: ? aspirin EC  81 mg Oral Daily  ? atorvastatin  40 mg Oral Daily  ? digoxin  0.25 mg Oral Daily  ? enoxaparin (LOVENOX) injection  40 mg Subcutaneous Q24H  ? metoprolol tartrate  12.5 mg Oral BID  ? polyethylene glycol  17 g Oral Daily  ? risperiDONE  1 mg Oral BID  ? ?Continuous Infusions: ? ?PRN Meds: ?acetaminophen, benztropine, hydrOXYzine, magnesium hydroxide, menthol-cetylpyridinium, ondansetron (ZOFRAN) IV, traZODone  ? ?Vital Signs  ?  ?Vitals:  ? 01/28/22 1431 01/28/22 2035 01/29/22 0616 01/29/22 0755  ?BP:  104/62 101/67 103/71  ?Pulse:    67  ?Resp:  20 11 15   ?Temp:  97.8 ?F (36.6 ?C) (!) 97.4 ?F (36.3 ?C) (!) 97.5 ?F (36.4 ?C)  ?TempSrc:  Oral Oral Oral  ?SpO2: 100%   99%  ?Weight:      ?Height:      ? ? ?Intake/Output Summary (Last 24 hours) at 01/29/2022 0929 ?Last data filed at 01/28/2022 2030 ?Gross per 24 hour  ?Intake 480 ml  ?Output --  ?Net 480 ml  ? ? ?  01/28/2022  ?  4:12 AM 01/23/2022  ?  9:13 PM 10/05/2021  ?  6:17 PM  ?Last 3 Weights  ?Weight (lbs) 170 lb 6.7 oz 169 lb 15.6 oz 170 lb  ?Weight (kg) 77.3 kg 77.1 kg 77.111 kg  ?   ? ?Telemetry  ?  ?AF Rates 50-70s - Personally Reviewed ? ?ECG  ?  ?No new tracings - Personally Reviewed ? ?Physical Exam  ? ?Gen: no distress  ?Neck: No JVD,  ?Cardiac: No Rubs or Gallops, no Murmur, IRIR +2 radial pulses ?Respiratory: Clear to auscultation bilaterally, normal effort, normal  respiratory rate ?Psych: Call affect, does not verbally respond to questions ? ? ?Labs  ?  ?High Sensitivity Troponin:  No results for input(s): TROPONINIHS in the last 720 hours.   ?Chemistry ?Recent Labs  ?Lab 01/23/22 ?2025 01/24/22 ?01/26/22 01/24/22 ?01/26/22 01/24/22 ?1406  01/25/22 ?0310 01/26/22 ?1228 01/27/22 ?03/29/22 01/28/22 ?0252  ?NA 155* 142 141   < > 139 137 138 140  ?K 3.9 3.2* 3.3*   < > 3.6 4.1 4.2 4.2  ?CL 119* 109 107  --  107 104 104 105  ?CO2 26 28 27   --  28 29 31 30   ?GLUCOSE 121* 165* 196*  --  114* 85 93 85  ?BUN 46* 42* 42*  --  23* 18 15 14   ?CREATININE 1.62* 0.96 1.13*  --  1.02* 0.72 0.75 0.72  ?CALCIUM 10.9* 9.1 9.3  --  8.8* 8.8* 9.0 8.7*  ?MG  --   --  2.3  --   --   --   --   --   ?PROT 8.7* 6.4*  --   --  6.0*  --   --   --   ?ALBUMIN 4.9 3.6  --   --  3.1*  --   --   --   ?AST 26 19  --   --  14*  --   --   --   ?ALT 17 15  --   --  13  --   --   --   ?  ALKPHOS 34* 26*  --   --  31*  --   --   --   ?BILITOT 1.3* 1.4*  --   --  0.8  --   --   --   ?GFRNONAA 41* >60 >60  --  >60 >60 >60 >60  ?ANIONGAP 10 5 7   --  4* 4* 3* 5  ? < > = values in this interval not displayed.  ?  ?Lipids  ?Recent Labs  ?Lab 01/25/22 ?0310  ?CHOL 215*  ?TRIG 112  ?HDL 26*  ?LDLCALC 167*  ?CHOLHDL 8.3  ?  ?Hematology ?Recent Labs  ?Lab 01/26/22 ?1228 01/27/22 ?0149 01/28/22 ?0252  ?WBC 4.0 3.9* 4.7  ?RBC 3.93 3.64* 3.64*  ?HGB 11.2* 10.2* 10.2*  ?HCT 34.9* 32.4* 32.6*  ?MCV 88.8 89.0 89.6  ?MCH 28.5 28.0 28.0  ?MCHC 32.1 31.5 31.3  ?RDW 15.9* 16.0* 16.5*  ?PLT 113* 116* 130*  ? ?Thyroid  ?Recent Labs  ?Lab 01/24/22 ?01/26/22  ?TSH 0.584  ?  ?BNP ?Recent Labs  ?Lab 01/27/22 ?0149  ?BNP 24.5  ?  ?DDimer No results for input(s): DDIMER in the last 168 hours.  ? ?Radiology  ?  ?No results found. ? ?Cardiac Studies  ? ?Echo 01/24/22: ?1. Left ventricular ejection fraction, by estimation, is 45 to 50%. The  ?left ventricle has mildly decreased function. The left ventricle  ?demonstrates global hypokinesis. Left ventricular diastolic function could  ?not be evaluated.  ? 2. Right ventricular systolic function is normal. The right ventricular  ?size is normal. There is normal pulmonary artery systolic pressure.  ? 3. The mitral valve is normal in structure. Trivial mitral valve  ?regurgitation. No  evidence of mitral stenosis.  ? 4. The aortic valve is tricuspid. There is mild calcification of the  ?aortic valve. Aortic valve regurgitation is not visualized. No aortic  ?stenosis is present.  ? 5. The inferior vena cava is normal in size with <50% respiratory  ?variability, suggesting right atrial pressure of 8 mmHg.  ? ?Patient Profile  ?   ?41 y.o. female with a hx of persistent Afib and mental health challenges including schizophrenia, hx of psychosis, PTSD, and anxiety who is followed for Afib  ? ?Assessment & Plan  ?  ?Permanent atrial fibrillation ?Afib RVR ?Hypotension ?- Has not need midodrine ?- continue digoxin 0.25 mg Po daily need outpatient dig level or follow up ?- continue metoprolol 12.5 mg PO BID ?- on ASA 81 mg  ?- see 01/27/22 for description of medications therapy that are not appropriate for her AF care ?- care to make sure digoxin is secure is important in outpatient because of overdose potential ? ?HFmrEF ?LVEF 45-50% ?- unable to use GDMT because of hypotension   ? ? ? ?[facility] will sign off.   ?Medication Recommendations:  Digoxin and metoprolol ?Other recommendations (labs, testing, etc):  digoxin level ?Follow up as an outpatient:  will arrange f/u  ? ? ? ?For questions or updates, please contact [facility] ?Please consult [email or web address] for contact info under  ? ?  ?   ?Signed, ?03/29/22, MD  ?01/29/2022, 9:29 AM   ? ? ? ? ?

## 2022-01-29 NOTE — Progress Notes (Signed)
Mobility Specialist Progress Note: ? ? 01/29/22 1400  ?Mobility  ?Activity Ambulated with assistance in hallway  ?Level of Assistance Minimal assist, patient does 75% or more  ?Assistive Device Other (Comment) ?(HHA)  ?Distance Ambulated (ft) 470 ft  ?Activity Response Tolerated well  ?$Mobility charge 1 Mobility  ? ?Pt present with flat affect, yet agreeable to mobility session. Required HHA for comfort/steadying. Pt back in bed with bed alarm on, all needs met.  ? ?Nelta Numbers ?Acute Rehab ?Phone: [phone] ?Office Phone: [phone] ? ?

## 2022-01-29 NOTE — Progress Notes (Signed)
Occupational Therapy Treatment ?Patient Details ?Name: Kathy Brown ?MRN: [id number] ?DOB: [birth year] ?Today's Date: 01/29/2022 ? ? ?History of present illness 41 y.o. female who [place] [facility] behavioral health urgent care due to pt being aggressive toward her parents and noncompliant with her meds. Pt admitted with hypernatremia, AKI, paranoid schizophrenia, acute metabolic encephalopathy, and permanent Afib with RVR. PMH: paranoid schizophrenia, intellectual disability, permanent atrial fibrillation not on oral anticoagulation, history of aggressive behavior ?  ?OT comments ? Patient received in bed and agreeable to OT with nodding yes.  Patient was not very verbal and responded to most questions with head nods. Patient was HHA with mobility and transfers for safety and supervision for self care tasks. Acute OT to continue to follow.   ? ?Recommendations for follow up therapy are one component of a multi-disciplinary discharge planning process, led by the attending physician.  Recommendations may be updated based on patient status, additional functional criteria and insurance authorization. ?   ?Follow Up Recommendations ? No OT follow up  ?  ?Assistance Recommended at Discharge Frequent or constant Supervision/Assistance  ?Patient can return home with the following ? A little help with bathing/dressing/bathroom;Direct supervision/assist for medications management;Direct supervision/assist for financial management;Assist for transportation;Assistance with cooking/housework ?  ?Equipment Recommendations ? None recommended by OT  ?  ?Recommendations for Other Services   ? ?  ?Precautions / Restrictions Precautions ?Precautions: Other (comment) (history of behaviors) ?Precaution Comments: incontinent at times, without awareness ?Restrictions ?Weight Bearing Restrictions: No  ? ? ?  ? ?Mobility Bed Mobility ?Overal bed mobility: Needs Assistance ?Bed Mobility: Supine to Sit, Sit to  Supine ?  ?  ?Supine to sit: Min guard, HOB elevated ?  ?  ?General bed mobility comments: min guard to assist with initiating ?  ? ?Transfers ?Overall transfer level: Needs assistance ?Equipment used: None ?Transfers: Sit to/from Stand ?Sit to Stand: Supervision ?  ?  ?  ?  ?  ?General transfer comment: min guard assist for safety during mobility ?  ?  ?Balance Overall balance assessment: No apparent balance deficits (not formally assessed) ?Sitting-balance support: No upper extremity supported, Feet supported ?Sitting balance-Leahy Scale: Normal ?  ?  ?Standing balance support: No upper extremity supported ?Standing balance-Leahy Scale: Good ?Standing balance comment: able to stand at sink for grooming task and stand to donn gown ?  ?  ?  ?  ?  ?  ?  ?  ?  ?  ?  ?   ? ?ADL either performed or assessed with clinical judgement  ? ?ADL Overall ADL's : Needs assistance/impaired ?Eating/Feeding: Independent;Sitting ?Eating/Feeding Details (indicate cue type and reason): no assist to open containers ?Grooming: Wash/dry hands;Wash/dry face;Supervision/safety;Standing ?Grooming Details (indicate cue type and reason): at sink ?  ?  ?  ?  ?Upper Body Dressing : Supervision/safety;Standing ?Upper Body Dressing Details (indicate cue type and reason): donn gown to cover back ?Lower Body Dressing: Supervision/safety;Sit to/from stand ?Lower Body Dressing Details (indicate cue type and reason): changed socks ?  ?  ?  ?  ?  ?  ?  ?General ADL Comments: patient hand held assist for mobility and required increased time to respond to commands ?  ? ?Extremity/Trunk Assessment   ?  ?  ?  ?  ?  ? ?Vision   ?  ?  ?Perception   ?  ?Praxis   ?  ? ?Cognition Arousal/Alertness: Awake/alert ?Behavior During Therapy: Flat affect, Impulsive ?Overall Cognitive Status: No family/caregiver present  to determine baseline cognitive functioning ?  ?  ?  ?  ?  ?  ?  ?  ?  ?  ?  ?  ?  ?  ?  ?  ?General Comments: little verbalization, responded mainly  with head nods ?  ?  ?   ?Exercises   ? ?  ?Shoulder Instructions   ? ? ?  ?General Comments    ? ? ?Pertinent Vitals/ Pain       Pain Assessment ?Pain Assessment: No/denies pain ? ?Home Living   ?  ?  ?  ?  ?  ?  ?  ?  ?  ?  ?  ?  ?  ?  ?  ?  ?  ?  ? ?  ?Prior Functioning/Environment    ?  ?  ?  ?   ? ?Frequency ? Min 2X/week  ? ? ? ? ?  ?Progress Toward Goals ? ?OT Goals(current goals can now be found in the care plan section) ? Progress towards OT goals: Progressing toward goals ? ?Acute Rehab OT Goals ?OT Goal Formulation: With patient ?Time For Goal Achievement: 02/10/22 ?Potential to Achieve Goals: Good ?ADL Goals ?Pt Will Transfer to Toilet: Independently;regular height toilet ?Pt Will Perform Toileting - Clothing Manipulation and hygiene: Independently;sit to/from stand ?Additional ADL Goal #1: Pt will perform basic ADLs independently.  ?Plan Discharge plan remains appropriate   ? ?Co-evaluation ? ? ?   ?  ?  ?  ?  ? ?  ?AM-PAC OT "6 Clicks" Daily Activity     ?Outcome Measure ? ? Help from another person eating meals?: None ?Help from another person taking care of personal grooming?: A Little ?Help from another person toileting, which includes using toliet, bedpan, or urinal?: A Little ?Help from another person bathing (including washing, rinsing, drying)?: A Little ?Help from another person to put on and taking off regular upper body clothing?: A Little ?Help from another person to put on and taking off regular lower body clothing?: A Little ?6 Click Score: 19 ? ?  ?End of Session Equipment Utilized During Treatment: Gait belt ? ?OT Visit Diagnosis: Other symptoms and signs involving cognitive function ?  ?Activity Tolerance Patient tolerated treatment well ?  ?Patient Left in chair;with call bell/phone within reach;with chair alarm set ?  ?Nurse Communication Mobility status ?  ? ?   ? ?Time: 1030-1051 ?OT Time Calculation (min): 21 min ? ?Charges: OT General Charges ?$OT Visit: 1 Visit ?OT  Treatments ?$Self Care/Home Management : 8-22 mins ? ?Alfonse Flavors, OTA ?Acute Rehabilitation Services  ?Pager [phone] ?Office [phone] ? ? ?Dewain Penning ?01/29/2022, 12:22 PM ?

## 2022-01-29 NOTE — TOC Progression Note (Signed)
Transition of Care (TOC) - Progression Note  ? ? ?Patient Details  ?Name: Nafisah Marenco ?MRN: [id number] ?Date of Birth: Oct 03, 1981 ? ?Transition of Care (TOC) CM/SW Contact  ?Milas Gain, LCSWA ?Phone Number: ?01/29/2022, 2:13 PM ? ?Clinical Narrative:    ? ?CSW received consult for outpatient resources for psychiatric follow up for patient.Due to patients current orientation,CSW spoke with patients mother Rica Mast regarding resources for outpatient psychiatric follow up for patient. CSW offered patients mother resources for psychiatry and counseling for patient. Rica Mast accepts resources to look over to help assist in making follow up appointment for patient. Patients mother  informed CSW she will be by this afternoon and will pick up resources. CSW informed Rica Mast she will leave resources in patients room to pick up. Patients mother thanked CSW. All questions answered. No further questions reported at this time. ? ?Expected Discharge Plan: [place] ?Barriers to Discharge: Continued Medical Work up ? ?Expected Discharge Plan and Services ?Expected Discharge Plan: [place] ?In-house Referral: Clinical Social Work ?  ?  ?Living arrangements for the past 2 months: [place] ?                ?  ?  ?  ?  ?  ?  ?  ?  ?  ?  ? ? ?Social Determinants of Health (SDOH) Interventions ?  ? ?Readmission Risk Interventions ?   ? View : No data to display.  ?  ?  ?  ? ? ?

## 2022-01-29 NOTE — Consult Note (Addendum)
[facility] Face-to-Face Psychiatry Consult  ? ?Reason for Consult:  Paranoid schizophrenia  ?Referring Physician:  Ulyess Mort ?Patient Identification: Kathy Brown ?MRN:  [id number] ?Principal Diagnosis: Hypernatremia ?Diagnosis:  Principal Problem: ?  Hypernatremia ?Active Problems: ?  Paranoid schizophrenia (HCC) ?  A-fib (HCC) ?  AKI (acute kidney injury) (HCC) ?  Rapid atrial fibrillation (HCC) ? ? ?Total Time spent with patient: 15 minutes ? ?Subjective:   ?Kathy Brown is a 41 y.o. female was seen and evaluated face-to-face.  She is awake, alert and remains oriented to self and place. Denies any current hallucinations; endorses feeling "weak", "lightheaded when I get up" and "scared". Wants to talk to her mom - wrote down phone # and attempted to help her dial out. No SI/HI. Endorses good appetite and fair sleep with some nightmares (not of things that have happened to her).  ? ? ?Collateral: ?Called mom - mom is happy with current progress (pt had not been eating, drinking, and exhibited purposeless violence prior to going to [facility]). Has not endorsed hallucinations to mom in some time. Is worried that she is still scared a lot of the time but this has generally improved. Would be happy to bring her home as long as she is able to get up and move around.  ? ?Chart reviewed. Has generally been able to talk to healthcare team, eating, not refusing risperidone. No documented episodes of aggression.  ? ? ?HPI:" Kathy Brown is a 41 y.o. female admitted medically for 01/23/2022 for agitation/aggression towards family and not taking her medications. She carries the psychiatric diagnoses of paranoid schizophrenia and has a past medical history of  intellectual disability, atrial fibrillation. Psychiatry was consulted for Schizophrenia and medication management by Carollee Herter MD."  ? ?Past Psychiatric History:  ? ?Risk to Self:   ?Risk to Others:   ?Prior Inpatient Therapy:   ?Prior Outpatient Therapy:   ? ?Past Medical  History:  ?Past Medical History:  ?Diagnosis Date  ? A-fib (HCC)   ? Anxiety   ? Atrial fibrillation with RVR (HCC) 10/15/2017  ? Auditory hallucinations   ? Chronic back pain   ? Dysrhythmia   ? Atrial fibrillation  ? Paranoid schizophrenia (HCC)   ? Psychosis (HCC) 03/25/2014  ? Schizoaffective disorder (HCC) 03/25/2014  ? Schizophrenia (HCC)   ? Urinary incontinence   ?  ?Past Surgical History:  ?Procedure Laterality Date  ? TOOTH EXTRACTION Bilateral 09/08/2018  ? Procedure: DENTAL RESTORATION/EXTRACTIONS;  Surgeon: Ocie Doyne, DDS;  Location: [facility];  Service: Oral Surgery;  Laterality: Bilateral;  ? ?Family History:  ?Family History  ?Problem Relation Age of Onset  ? Hypertension Mother   ? Diabetes Mother   ? ?Family Psychiatric  History:  ?Social History:  ?Social History  ? ?Substance and Sexual Activity  ?Alcohol Use No  ?   ?Social History  ? ?Substance and Sexual Activity  ?Drug Use No  ?  ?Social History  ? ?Socioeconomic History  ? Marital status: Single  ?  Spouse name: Not on file  ? Number of children: Not on file  ? Years of education: Not on file  ? Highest education level: Not on file  ?Occupational History  ? Not on file  ?Tobacco Use  ? Smoking status: Never  ? Smokeless tobacco: Never  ?Vaping Use  ? Vaping Use: Never used  ?Substance and Sexual Activity  ? Alcohol use: No  ? Drug use: No  ? Sexual activity: Not Currently  ?  Birth control/protection: None  ?Other Topics Concern  ? Not on file  ?Social History Narrative  ? Lives mom, dad, grandmother, and three kids  ? ?Social Determinants of Health  ? ?Financial Resource Strain: Not on file  ?Food Insecurity: Not on file  ?Transportation Needs: Not on file  ?Physical Activity: Not on file  ?Stress: Not on file  ?Social Connections: Not on file  ? ?Additional Social History: ?  ? ?Allergies:  No Known Allergies ? ?Labs:  ?Results for orders placed or performed during the hospital encounter of 01/23/22 (from the past 48  hour(s))  ?CBC     Status: Abnormal  ? Collection Time: 01/28/22  2:52 AM  ?Result Value Ref Range  ? WBC 4.7 4.0 - 10.5 K/uL  ? RBC 3.64 (L) 3.87 - 5.11 MIL/uL  ? Hemoglobin 10.2 (L) 12.0 - 15.0 g/dL  ? HCT 32.6 (L) 36.0 - 46.0 %  ? MCV 89.6 80.0 - 100.0 fL  ? MCH 28.0 26.0 - 34.0 pg  ? MCHC 31.3 30.0 - 36.0 g/dL  ? RDW 16.5 (H) 11.5 - 15.5 %  ? Platelets 130 (L) 150 - 400 K/uL  ?  Comment: REPEATED TO VERIFY  ? nRBC 0.0 0.0 - 0.2 %  ?  Comment: Performed at [facility], [address] [place], [place] [address]  ?Basic metabolic panel     Status: Abnormal  ? Collection Time: 01/28/22  2:52 AM  ?Result Value Ref Range  ? Sodium 140 135 - 145 mmol/L  ? Potassium 4.2 3.5 - 5.1 mmol/L  ? Chloride 105 98 - 111 mmol/L  ? CO2 30 22 - 32 mmol/L  ? Glucose, Bld 85 70 - 99 mg/dL  ?  Comment: Glucose reference range applies only to samples taken after fasting for at least 8 hours.  ? BUN 14 6 - 20 mg/dL  ? Creatinine, Ser 0.72 0.44 - 1.00 mg/dL  ? Calcium 8.7 (L) 8.9 - 10.3 mg/dL  ? GFR, Estimated >60 >60 mL/min  ?  Comment: (NOTE) ?Calculated using the CKD-EPI Creatinine Equation (2021) ?  ? Anion gap 5 5 - 15  ?  Comment: Performed at [facility], [address], [place], [place] [address]  ?Digoxin level     Status: None  ? Collection Time: 01/28/22 10:11 AM  ?Result Value Ref Range  ? Digoxin Level 1.2 0.8 - 2.0 ng/mL  ?  Comment: Performed at [facility], [address] [place], [place] [address]  ? ? ?Current Facility-Administered Medications  ?Medication Dose Route Frequency Provider Last Rate Last Admin  ? acetaminophen (TYLENOL) tablet 650 mg  650 mg Oral Q4H PRN Carollee Herter, DO   650 mg at 01/29/22 1601  ? aspirin EC tablet 81 mg  81 mg Oral Daily Carollee Herter, DO   81 mg at 01/29/22 0932  ? atorvastatin (LIPITOR) tablet 40 mg  40 mg Oral Daily Dow Adolph N, DO   40 mg at 01/29/22 3557  ? benztropine (COGENTIN) tablet 0.5 mg  0.5 mg Oral BID PRN Carollee Herter, DO      ? digoxin (LANOXIN) tablet  0.25 mg  0.25 mg Oral Daily Wilmot, Kobina A, MD   0.25 mg at 01/29/22 3220  ? enoxaparin (LOVENOX) injection 40 mg  40 mg Subcutaneous Q24H Dow Adolph N, DO   40 mg at 01/27/22 1223  ? hydrOXYzine (ATARAX) tablet 25 mg  25 mg Oral TID PRN Carollee Herter, DO  25 mg at 01/29/22 [id number]  ? magnesium hydroxide (MILK OF MAGNESIA) suspension 30 mL  30 mL Oral Daily PRN Erick BlinksMemon, Jehanzeb, MD   30 mL at 01/28/22 0825  ? menthol-cetylpyridinium (CEPACOL) lozenge 3 mg  1 lozenge Oral PRN Eduard ClosKakrakandy, Arshad N, MD   3 mg at 01/27/22 1111  ? metoprolol tartrate (LOPRESSOR) tablet 12.5 mg  12.5 mg Oral BID Erick BlinksMemon, Jehanzeb, MD   12.5 mg at 01/29/22 [id number]  ? ondansetron (ZOFRAN) injection 4 mg  4 mg Intravenous Q6H PRN Carollee Herterhen, Eric, DO   4 mg at 01/25/22 1647  ? polyethylene glycol (MIRALAX / GLYCOLAX) packet 17 g  17 g Oral Daily Erick BlinksMemon, Jehanzeb, MD   17 g at 01/29/22 [id number]  ? risperiDONE (RISPERDAL) tablet 1 mg  1 mg Oral BID Carollee Herterhen, Eric, DO   1 mg at 01/29/22 [id number]  ? traZODone (DESYREL) tablet 50 mg  50 mg Oral QHS PRN Carollee Herterhen, Eric, DO   50 mg at 01/28/22 2034  ? ? ?Musculoskeletal: ?Strength & Muscle Tone:  N/A ?Gait & Station: normal ?Patient leans: N/A ? ? ? ? ? ? ? ? ? ? ? ?Psychiatric Specialty Exam: ? ?Presentation  ?General Appearance: Appropriate for Environment ? ?Eye Contact:Good ? ?Speech:-- (quiet, reticent) ? ?Speech Volume:Decreased ? ?Handedness:Right ? ? ?Mood and Affect  ?Mood:-- ("scared") ? ?Affect:Congruent; Blunt ? ? ?Thought Process  ?Thought Processes:Coherent ? ?Descriptions of Associations:Intact ? ?Orientation:Partial ? ?Thought Content:-- (devoid of SI, HI, delusions, paranoia) ? ?History of Schizophrenia/Schizoaffective disorder:No ? ?Duration of Psychotic Symptoms:Greater than six months ? ?Hallucinations:Hallucinations: -- (Denied) ? ? ? ?Ideas of Reference:None ? ?Suicidal Thoughts:Suicidal Thoughts: No ? ? ? ?Homicidal Thoughts:Homicidal Thoughts: No ? ? ? ? ?Sensorium  ?Memory:Immediate Fair; Recent Poor;  Remote Poor ? ?Judgment:Poor ? ?Insight:Poor ? ? ?Executive Functions  ?Concentration:Poor ? ?Attention Span:Poor ? ?Recall:Poor ? ?Fund of Knowledge:Poor ? ?Language:Poor ? ? ?Psychomotor Activity  ?Psychomo

## 2022-01-30 DIAGNOSIS — E870 Hyperosmolality and hypernatremia: Secondary | ICD-10-CM | POA: Diagnosis not present

## 2022-01-30 MED ORDER — METOPROLOL SUCCINATE ER 25 MG PO TB24
25.0000 mg | ORAL_TABLET | Freq: Every day | ORAL | Status: DC
  Administered 2022-01-30 – 2022-01-31 (×2): 25 mg via ORAL
  Filled 2022-01-30 (×2): qty 1

## 2022-01-30 NOTE — Progress Notes (Signed)
[facility] ?PROGRESS NOTE ? ?Patient: Kathy Brown [id number]   ?PCP: Pcp, No DOB: 05/30/1981   ?DOA: 01/23/2022   DOS: 01/30/2022   ? ?Subjective: Refused all her medication last night as well as this morning.  No nausea or vomiting.  Denies any acute complaint. ? ?Objective:  ?Vitals:  ? 01/30/22 0556 01/30/22 1749  ?BP: 122/82 (P) 107/70  ?Pulse: 82   ?Resp: 16   ?Temp: 97.9 ?F (36.6 ?C)   ?SpO2: 97% (P) 100%  ?  ?S1-S2 present.  Irregular.  But rate controlled.  Clear to auscultation.  No other focal deficit.  No edema. ? ?Assessment and plan: ?Acute metabolic encephalopathy. ?Paranoid schizophrenia. ?Psychiatry was consulted. ?Currently signed off. ?Continue benztropine hydroxyzine Risperdal and trazodone. ?Patient currently agreeable to take all her medication. ?Remains at high risk for readmission if she does not take her medication. ?We will need to monitor overnight to ensure that the patient is able to take her medications in the morning and likely discharge in the morning. ? ?Author: ?Berle Mull, MD ?[facility] ?01/30/2022 6:21 PM   ?If 7PM-7AM, please contact night-coverage at [email or web address]  ?

## 2022-01-30 NOTE — Progress Notes (Signed)
Pt refusing all her AM meds, and still refusing telemetry. MD Patel aware. ?

## 2022-01-30 NOTE — Progress Notes (Signed)
Pt refused to take night medications. Attempted multiple times to administer. Md notified  ?

## 2022-01-30 NOTE — Progress Notes (Signed)
Pt refused AM labs

## 2022-01-31 ENCOUNTER — Inpatient Hospital Stay (HOSPITAL_COMMUNITY): Payer: Medicare Other

## 2022-01-31 DIAGNOSIS — E870 Hyperosmolality and hypernatremia: Secondary | ICD-10-CM | POA: Diagnosis not present

## 2022-01-31 MED ORDER — METOPROLOL SUCCINATE ER 25 MG PO TB24
25.0000 mg | ORAL_TABLET | Freq: Every day | ORAL | 0 refills | Status: AC
Start: 2022-01-31 — End: ?

## 2022-01-31 MED ORDER — ASPIRIN 81 MG PO TBEC: 81.0000 mg | DELAYED_RELEASE_TABLET | Freq: Every day | ORAL | 11 refills | Status: AC

## 2022-01-31 MED ORDER — ATORVASTATIN CALCIUM 40 MG PO TABS: 40.0000 mg | ORAL_TABLET | Freq: Every day | ORAL | 0 refills | Status: AC

## 2022-01-31 MED ORDER — POLYETHYLENE GLYCOL 3350 17 G PO PACK: 17.0000 g | PACK | Freq: Every day | ORAL | 0 refills | Status: AC

## 2022-01-31 MED ORDER — TRAZODONE HCL 50 MG PO TABS: 50.0000 mg | ORAL_TABLET | Freq: Every evening | ORAL | 30 refills | Status: AC | PRN

## 2022-01-31 MED ORDER — DIGOXIN 250 MCG PO TABS
0.2500 mg | ORAL_TABLET | Freq: Every day | ORAL | 0 refills | Status: AC
Start: 2022-01-31 — End: ?

## 2022-01-31 MED ORDER — RISPERIDONE 1 MG PO TABS: 1.0000 mg | ORAL_TABLET | Freq: Two times a day (BID) | ORAL | 0 refills | Status: AC

## 2022-01-31 NOTE — TOC Transition Note (Signed)
Transition of Care (TOC) - CM/SW Discharge Note ? ? ?Patient Details  ?Name: Kathy [name] ?MRN: [id number] ?Date of Birth: [birth year] ? ?Transition of Care [facility]) CM/SW Contact:  ?Durenda Guthrie, RN ?Phone Number: ?01/31/2022, 2:20 PM ? ? ?Clinical Narrative:  Patient has been scheduled a [facility] for appointment to establish PCP and hospital followup:   ? [facility] # [phone]  ? ? ?FOR APPT:  ? ?TIME : 2:30 PM  ?DATE: MAY 08 ,2023  ?Bertram Denver , NP  ?LOCATION: [address]  ? ?Appointment has been added AVS. ?  ?Barriers to Discharge: Continued Medical Work up ? ? ?Patient Goals and CMS Choice ?  ?CMS Medicare.gov Compare Post Acute Care list provided to:: Patient Represenative (must comment) (Patients mother [name]) ?Choice offered to / list presented to : Parent (Patients mother) ? ?Discharge Placement ?  ?           ?  ?  ?  ?  ? ?Discharge Plan and Services ?In-house Referral: Clinical Social Work ?  ?           ?  ?  ?  ?  ?  ?  ?  ?  ?  ?  ? ?Social Determinants of Health (SDOH) Interventions ?  ? ? ?Readmission Risk Interventions ?   ? View : No data to display.  ?  ?  ?  ? ? ? ? ? ?

## 2022-01-31 NOTE — Discharge Summary (Signed)
?Physician Discharge Summary ?  ?Patient: Kathy [name] MRN: [id number] DOB: December 28, 1980  ?Admit date:     01/23/2022  ?Discharge date: 01/31/2022  ?Discharge Physician: Lynden Oxford  ?PCP: Pcp, No ? ?Recommendations at discharge: ?Follow-up with PCP and cardiology as recommended. ? ?Discharge Diagnoses: ?Principal Problem: ?  Hypernatremia ?Active Problems: ?  [name] (acute kidney injury) (HCC) ?  Paranoid schizophrenia (HCC) ?  A-fib (HCC) ?  Rapid atrial fibrillation (HCC) ? ?Hospital Course: ?Kathy [name] is a 41 y.o. female with PMH significant for paranoid schizophrenia, schizoaffective disorder, anxiety, intellectual disability, permanent A-fib not on oral anticoagulation, history of aggressive behavior who was brought to the ED on 3/30 from behavioral health urgent care.   ?Patient reportedly stopped eating, drinking and taking her meds 2 weeks prior.  She was exhibiting outburst of crying, screaming and talking to herself.  She was being aggressive toward her parents and not taking her medications.   ?In the ED, patient was hypertensive, hyponatremic with sodium level significantly up at 155, [name].   ?Admitted to hospitalist service.  Psychiatry consulted.   ?Hospital course also complicated by A-fib with RVR for which cardiology was consulted.  ? ?Assessment and Plan: ?*Hypovolemic hypernatremia ?Now resolved. ?Treated with IV fluids. ?Continue to encourage oral hydration. ?  ?[name] (acute kidney injury) (HCC) ?From poor p.o. intake. ?Serum creatinine was elevated to 1.6 on admission ?Now normal with IVF. Hold nephrotoxic drugs. ?  ?Acute metabolic encephalopathy ?Paranoid schizophrenia  ?Schizophrenia ?-Medical management per psychiatry. ?-Continue benztropine, hydroxyzine, Risperdal, trazodone as needed ?-Acute alteration in mental status due to hyponatremia and underlying psych issues ?-Mental status improving. ?  ?Permanent A-fib with RVR ?-Cardiology consult obtained.  Currently on metoprolol,  digoxin. ?-CHA2DS2-VASc score of 1.  Not on anticoagulation because of noncompliance, episodes of aggressive behavior. ?-Echo on 01/24/2022 with EF 45 to 50%, global hypokinesis ?  ?Hyperlipidemia ?-Fasting lipid panel, LDL 167 ?-On Lipitor 40 mg daily, also on aspirin 81 mg daily. ?  ?Constipation resolved ?-Staff reports no stools for several days ?-Started on MiraLAX ?-Give Dulcolax suppository ? ?Consultants: Cardiology  ?Psychiatry ? ?Procedures performed:  ?Echocardiogram  ? ?DISCHARGE MEDICATION: ?Allergies as of 01/31/2022   ?No Known Allergies ?  ? ?  ?Medication List  ?  ? ?STOP taking these medications   ? ?diltiazem 120 MG 24 hr capsule ?Commonly known as: CARDIZEM CD ?  ? ?  ? ?TAKE these medications   ? ?aspirin 81 MG EC tablet ?Take 1 tablet (81 mg total) by mouth daily. Swallow whole. ?  ?atorvastatin 40 MG tablet ?Commonly known as: LIPITOR ?Take 1 tablet (40 mg total) by mouth daily. ?  ?benztropine 0.5 MG tablet ?Commonly known as: COGENTIN ?Take 1 tablet (0.5 mg total) by mouth 2 (two) times daily as needed for tremors (Extraparametal side effects). ?  ?digoxin 0.25 MG tablet ?Commonly known as: LANOXIN ?Take 1 tablet (0.25 mg total) by mouth daily. ?  ?hydrOXYzine 25 MG tablet ?Commonly known as: ATARAX ?Take 1 tablet (25 mg total) by mouth 3 (three) times daily as needed for anxiety. ?  ?metoprolol succinate 25 MG 24 hr tablet ?Commonly known as: TOPROL-XL ?Take 1 tablet (25 mg total) by mouth daily. ?  ?polyethylene glycol 17 g packet ?Commonly known as: MIRALAX / GLYCOLAX ?Take 17 g by mouth daily. ?  ?risperiDONE 1 MG tablet ?Commonly known as: RisperDAL ?Take 1 tablet (1 mg total) by mouth 2 (two) times daily. ?  ?traZODone 50 MG tablet ?Commonly known as:  DESYREL ?Take 1 tablet (50 mg total) by mouth at bedtime as needed for sleep. ?  ? ?  ? ? Follow-up Information   ? ? Joylene Grapes, NP Follow up on 02/27/2022.   ?Specialties: Nurse Practitioner, Family Medicine ?Why: @9 :45am for hospital  follow up. Please arrive 15 minutes early ?Contact information: ?[address] ?[place] ?[phone] ? ? ?  ?  ? ? [phone], NP Follow up.   ?Specialty: Nurse Practitioner ?Why: TIME : ?Contact information: ?[address] ?[address] ?[place] ?[phone] ? ? ?  ?  ? ? [phone], NP Follow up.   ?Specialty: Nurse Practitioner ?Why: TIME : 2:30 PM ?DATE: MAY 08 ,2023 ?May 10 , NP ?Contact information: ?[address] [place] ?[address] ?[place] ?[phone] ? ? ?  ?  ? ?  ?  ? ?  ? ?Disposition: Home ?Diet recommendation:  ?Discharge Diet Orders (From admission, onward)  ? ?  Start     Ordered  ? 01/31/22 0000  Diet - low sodium heart healthy       ? 01/31/22 0823  ? ?  ?  ? ?  ? ?Discharge Exam: ?Filed Weights  ? 01/29/22 03/31/22 01/30/22 0556 01/31/22 0604  ?Weight: 60.8 kg 58.8 kg 57.9 kg  ? ?General: Appear in no distress; no visible Abnormal Neck Mass Or lumps, Conjunctiva normal ?Cardiovascular: S1 and S2 Present, no Murmur, ?Respiratory: good respiratory effort, Bilateral Air entry present and CTA, no Crackles, no wheezes ?Abdomen: Bowel Sound present Non tender ?Extremities: no Pedal edema ?Neurology: alert and oriented to time, place, and person ?Gait not checked due to patient safety concerns  ? ?Condition at discharge: good ? ?The results of significant diagnostics from this hospitalization (including imaging, microbiology, ancillary and laboratory) are listed below for reference.  ? ?Imaging Studies: ?DG Abd 1 View ? ?Result Date: 01/26/2022 ?CLINICAL DATA:  Abdominal pain EXAM: ABDOMEN - 1 VIEW COMPARISON:  CT 01/08/2021 FINDINGS: The bowel gas pattern is normal. Scattered stool throughout the colon. No radio-opaque calculi or other significant radiographic abnormality are seen. IMPRESSION: Normal nonobstructive bowel gas pattern with stool-filled colon. Electronically Signed   By: 01/10/2021 M.D.   On: 01/26/2022 22:33   ? ?DG Abd Portable 1V ? ?Result Date: 01/31/2022 ?CLINICAL DATA:  Abdominal discomfort EXAM: PORTABLE ABDOMEN - 1 VIEW COMPARISON:  01/26/2022 FINDINGS: Bowel gas pattern is unremarkable. Stool is present in the right and transverse colon. No acute osseous abnormality. IMPRESSION: Normal bowel gas pattern. Electronically Signed   By: 03/28/2022 M.D.   On: 01/31/2022 12:26  ? ?ECHOCARDIOGRAM COMPLETE ? ?Result Date: 01/24/2022 ?   ECHOCARDIOGRAM REPORT   Patient Name:   Kathy [name] Date of Exam: 01/24/2022 Medical Rec #:  01/26/2022       Height:       64.0 in Accession #:    [id number]      Weight:       170.0 lb Date of Birth:  26-Jul-1981       BSA:          1.826 m? Patient Age:    40 years        BP:           91/59 mmHg Patient Gender: F               HR:           99 bpm. Exam  Location:  Inpatient Procedure: 2D Echo, Cardiac Doppler and Color Doppler Indications:    Atrial fibrillation  History:        Patient has prior history of Echocardiogram examinations, most                 recent 09/09/2018. Arrythmias:Atrial Fibrillation. Acute kidney                 injury.  Sonographer:    Ross LudwigArthur Guy RDCS (AE) Referring Phys: [id number] ERIC CHEN  Sonographer Comments: Patient unable to complete IVC collapse test. IMPRESSIONS  1. Left ventricular ejection fraction, by estimation, is 45 to 50%. The left ventricle has mildly decreased function. The left ventricle demonstrates global hypokinesis. Left ventricular diastolic function could not be evaluated.  2. Right ventricular systolic function is normal. The right ventricular size is normal. There is normal pulmonary artery systolic pressure.  3. The mitral valve is normal in structure. Trivial mitral valve regurgitation. No evidence of mitral stenosis.  4. The aortic valve is tricuspid. There is mild calcification of the aortic valve. Aortic valve regurgitation is not visualized. No aortic stenosis is present.  5. The inferior vena cava is normal in size with <50%  respiratory variability, suggesting right atrial pressure of 8 mmHg. Comparison(s): Changes from prior study are noted. EF decreased from prior, but patient is in afib RVR throughout current study. FINDINGS  Left Ventricle: Lef

## 2022-02-10 ENCOUNTER — Inpatient Hospital Stay: Payer: Medicare Other | Admitting: Nurse Practitioner

## 2022-02-20 ENCOUNTER — Inpatient Hospital Stay: Payer: Self-pay | Admitting: Nurse Practitioner

## 2022-02-27 ENCOUNTER — Ambulatory Visit: Payer: Medicare Other | Admitting: Nurse Practitioner

## 2022-03-03 ENCOUNTER — Inpatient Hospital Stay: Payer: Medicare Other | Admitting: Nurse Practitioner
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
## 2022-03-04 ENCOUNTER — Ambulatory Visit (INDEPENDENT_AMBULATORY_CARE_PROVIDER_SITE_OTHER): Payer: Medicare Other | Admitting: Nurse Practitioner

## 2022-03-04 ENCOUNTER — Encounter: Payer: Self-pay | Admitting: Nurse Practitioner

## 2022-03-04 VITALS — BP 122/87 | HR 93 | Ht 63.0 in | Wt 124.4 lb

## 2022-03-04 DIAGNOSIS — I4811 Longstanding persistent atrial fibrillation: Secondary | ICD-10-CM

## 2022-03-04 DIAGNOSIS — I519 Heart disease, unspecified: Secondary | ICD-10-CM

## 2022-03-04 DIAGNOSIS — D649 Anemia, unspecified: Secondary | ICD-10-CM | POA: Diagnosis not present

## 2022-03-04 DIAGNOSIS — Z79899 Other long term (current) drug therapy: Secondary | ICD-10-CM

## 2022-03-04 DIAGNOSIS — F209 Schizophrenia, unspecified: Secondary | ICD-10-CM | POA: Diagnosis not present

## 2022-03-04 DIAGNOSIS — Z91199 Patient's noncompliance with other medical treatment and regimen due to unspecified reason: Secondary | ICD-10-CM

## 2022-03-04 NOTE — Patient Instructions (Signed)
Medication Instructions:  ?Your physician recommends that you continue on your current medications as directed. Please refer to the Current Medication list given to you today.  ? ?*If you need a refill on your cardiac medications before your next appointment, please call your pharmacy* ? ? ?Lab Work: ?Your physician recommends that you complete labs today ?CBC ?Digoxin  ? ?If you have labs (blood work) drawn today and your tests are completely normal, you will receive your results only by: ?MyChart Message (if you have MyChart) OR ?A paper copy in the mail ?If you have any lab test that is abnormal or we need to change your treatment, we will call you to review the results. ? ? ?Testing/Procedures: ?NONE ordered at this time of appointment  ? ? ? ?Follow-Up: ?At [facility], you and your health needs are our priority.  As part of our continuing mission to provide you with exceptional heart care, we have created designated Provider Care Teams.  These Care Teams include your primary Cardiologist (physician) and Advanced Practice Providers (APPs -  Physician Assistants and Nurse Practitioners) who all work together to provide you with the care you need, when you need it. ? ?We recommend signing up for the patient portal called "MyChart".  Sign up information is provided on this After Visit Summary.  MyChart is used to connect with patients for Virtual Visits (Telemedicine).  Patients are able to view lab/test results, encounter notes, upcoming appointments, etc.  Non-urgent messages can be sent to your provider as well.   ?To learn more about what you can do with MyChart, go to [email or web address].   ? ?Your next appointment:   ?2-3 month(s) ? ?The format for your next appointment:   ?In Person ? ?Provider:   ?Sanda Klein, MD   ? ? ?Other Instructions ? ? ?Important Information About Sugar ? ? ? ? ? ? ?

## 2022-03-05 ENCOUNTER — Telehealth: Payer: Self-pay

## 2022-03-05 LAB — CBC
Hematocrit: 39.1 % (ref 34.0–46.6)
Hemoglobin: 12.7 g/dL (ref 11.1–15.9)
MCH: 28.3 pg (ref 26.6–33.0)
MCHC: 32.5 g/dL (ref 31.5–35.7)
MCV: 87 fL (ref 79–97)
Platelets: 246 10*3/uL (ref 150–450)
RBC: 4.48 x10E6/uL (ref 3.77–5.28)
RDW: 15.7 % — ABNORMAL HIGH (ref 11.7–15.4)
WBC: 3.9 10*3/uL (ref 3.4–10.8)

## 2022-03-05 LAB — DIGOXIN LEVEL: Digoxin, Serum: <0.4 ng/mL — ABNORMAL LOW (ref 0.5–0.9)

## 2022-03-05 NOTE — Telephone Encounter (Signed)
Lmom to discuss lab results. Waiting on a return call.  

## 2022-03-11 NOTE — Telephone Encounter (Signed)
Attempted to reach pt. VM was full, not able to leave a VM. Tried calling other number listed. Number was not in service. Will call pt back.  ?

## 2022-03-18 NOTE — Telephone Encounter (Signed)
Mailing letter to pt. Not able to reach pt.

## 2022-04-02 ENCOUNTER — Inpatient Hospital Stay: Payer: Medicare Other | Admitting: Nurse Practitioner

## 2022-05-07 ENCOUNTER — Emergency Department (HOSPITAL_COMMUNITY)
Admission: EM | Admit: 2022-05-07 | Discharge: 2022-05-21 | Disposition: A | Discharge status: Home / Self Care | Payer: Medicare Other | Attending: Emergency Medicine | Admitting: Emergency Medicine

## 2022-05-07 ENCOUNTER — Encounter (HOSPITAL_COMMUNITY): Payer: Self-pay | Admitting: Emergency Medicine

## 2022-05-07 DIAGNOSIS — F22 Delusional disorders: Secondary | ICD-10-CM | POA: Diagnosis not present

## 2022-05-07 DIAGNOSIS — D709 Neutropenia, unspecified: Secondary | ICD-10-CM | POA: Diagnosis not present

## 2022-05-07 DIAGNOSIS — F419 Anxiety disorder, unspecified: Secondary | ICD-10-CM | POA: Insufficient documentation

## 2022-05-07 DIAGNOSIS — U071 COVID-19: Secondary | ICD-10-CM | POA: Insufficient documentation

## 2022-05-07 DIAGNOSIS — Z1339 Encounter for screening examination for other mental health and behavioral disorders: Secondary | ICD-10-CM | POA: Insufficient documentation

## 2022-05-07 DIAGNOSIS — D72819 Decreased white blood cell count, unspecified: Secondary | ICD-10-CM | POA: Insufficient documentation

## 2022-05-07 DIAGNOSIS — F200 Paranoid schizophrenia: Secondary | ICD-10-CM | POA: Diagnosis not present

## 2022-05-07 DIAGNOSIS — Z7982 Long term (current) use of aspirin: Secondary | ICD-10-CM | POA: Insufficient documentation

## 2022-05-07 DIAGNOSIS — F29 Unspecified psychosis not due to a substance or known physiological condition: Secondary | ICD-10-CM | POA: Diagnosis present

## 2022-05-07 DIAGNOSIS — F600 Paranoid personality disorder: Secondary | ICD-10-CM | POA: Diagnosis not present

## 2022-05-07 DIAGNOSIS — Z79899 Other long term (current) drug therapy: Secondary | ICD-10-CM | POA: Insufficient documentation

## 2022-05-07 MED ORDER — ACETAMINOPHEN 500 MG PO TABS
1000.0000 mg | ORAL_TABLET | Freq: Once | ORAL | Status: AC
  Administered 2022-05-07: 1000 mg via ORAL
  Filled 2022-05-07: qty 2
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
## 2022-05-07 NOTE — ED Notes (Signed)
Patient noted to be screaming and crying loudly stating she is afraid. Patient taken to triage for re-assessment. Patient moved near triage away from the lobby for her safety d/t the lobby being noisy

## 2022-05-07 NOTE — ED Provider Notes (Signed)
Patient was brought to triage room after crying in the waiting room.  Patient reports that she woke up and was having a nightmare.  Patient reports that she was scared after waking up from a nightmare.  Patient is requesting something for pain.  We will give patient Tylenol at this time.   Berneice Heinrich 05/07/22 2020    Mancel Bale, MD 05/07/22 [phone]
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
## 2022-05-08 ENCOUNTER — Other Ambulatory Visit: Payer: Self-pay

## 2022-05-08 DIAGNOSIS — I4891 Unspecified atrial fibrillation: Secondary | ICD-10-CM | POA: Diagnosis not present

## 2022-05-08 DIAGNOSIS — F200 Paranoid schizophrenia: Secondary | ICD-10-CM | POA: Diagnosis not present

## 2022-05-08 LAB — CBC WITH DIFFERENTIAL/PLATELET
Abs Immature Granulocytes: 0.01 10*3/uL (ref 0.00–0.07)
Basophils Absolute: 0 10*3/uL (ref 0.0–0.1)
Basophils Relative: 1 %
Eosinophils Absolute: 0 10*3/uL (ref 0.0–0.5)
Eosinophils Relative: 1 %
HCT: 40.6 % (ref 36.0–46.0)
Hemoglobin: 13.2 g/dL (ref 12.0–15.0)
Immature Granulocytes: 0 %
Lymphocytes Relative: 58 %
Lymphs Abs: 2 10*3/uL (ref 0.7–4.0)
MCH: 28.6 pg (ref 26.0–34.0)
MCHC: 32.5 g/dL (ref 30.0–36.0)
MCV: 88.1 fL (ref 80.0–100.0)
Monocytes Absolute: 0.3 10*3/uL (ref 0.1–1.0)
Monocytes Relative: 9 %
Neutro Abs: 1.1 10*3/uL — ABNORMAL LOW (ref 1.7–7.7)
Neutrophils Relative %: 31 %
Platelets: 254 10*3/uL (ref 150–400)
RBC: 4.61 MIL/uL (ref 3.87–5.11)
RDW: 14.9 % (ref 11.5–15.5)
WBC: 3.4 10*3/uL — ABNORMAL LOW (ref 4.0–10.5)
nRBC: 0 % (ref 0.0–0.2)

## 2022-05-08 LAB — COMPREHENSIVE METABOLIC PANEL
ALT: 12 U/L (ref 0–44)
AST: 18 U/L (ref 15–41)
Albumin: 4 g/dL (ref 3.5–5.0)
Alkaline Phosphatase: 39 U/L (ref 38–126)
Anion gap: 11 (ref 5–15)
BUN: 8 mg/dL (ref 6–20)
CO2: 29 mmol/L (ref 22–32)
Calcium: 9.7 mg/dL (ref 8.9–10.3)
Chloride: 102 mmol/L (ref 98–111)
Creatinine, Ser: 0.92 mg/dL (ref 0.44–1.00)
GFR, Estimated: >60 mL/min (ref >60)
Glucose, Bld: 90 mg/dL (ref 70–99)
Potassium: 3.5 mmol/L (ref 3.5–5.1)
Sodium: 142 mmol/L (ref 135–145)
Total Bilirubin: 0.6 mg/dL (ref 0.3–1.2)
Total Protein: 6.9 g/dL (ref 6.5–8.1)

## 2022-05-08 LAB — I-STAT BETA HCG BLOOD, ED (MC, WL, AP ONLY): I-stat hCG, quantitative: <5 m[IU]/mL (ref <5)

## 2022-05-08 LAB — ETHANOL: Alcohol, Ethyl (B): <10 mg/dL (ref <10)

## 2022-05-08 MED ORDER — LORAZEPAM 2 MG/ML IJ SOLN
1.0000 mg | Freq: Once | INTRAMUSCULAR | Status: AC
  Administered 2022-05-08: 1 mg via INTRAMUSCULAR
  Filled 2022-05-08: qty 1

## 2022-05-08 MED ORDER — HYDROXYZINE HCL 25 MG PO TABS
25.0000 mg | ORAL_TABLET | Freq: Three times a day (TID) | ORAL | Status: DC | PRN
  Administered 2022-05-08 – 2022-05-17 (×8): 25 mg via ORAL
  Filled 2022-05-08 (×8): qty 1

## 2022-05-08 MED ORDER — BENZTROPINE MESYLATE 1 MG PO TABS
0.5000 mg | ORAL_TABLET | Freq: Two times a day (BID) | ORAL | Status: DC | PRN
  Administered 2022-05-13 – 2022-05-17 (×2): 0.5 mg via ORAL
  Filled 2022-05-08 (×2): qty 1

## 2022-05-08 MED ORDER — DIGOXIN 250 MCG PO TABS
0.2500 mg | ORAL_TABLET | Freq: Every day | ORAL | Status: DC
  Administered 2022-05-08 – 2022-05-21 (×9): 0.25 mg via ORAL
  Filled 2022-05-08 (×9): qty 1
  Filled 2022-05-08: qty 2
  Filled 2022-05-08 (×7): qty 1

## 2022-05-08 MED ORDER — RISPERIDONE 1 MG PO TABS
1.0000 mg | ORAL_TABLET | Freq: Two times a day (BID) | ORAL | Status: DC
  Administered 2022-05-08 – 2022-05-12 (×6): 1 mg via ORAL
  Filled 2022-05-08 (×11): qty 1

## 2022-05-08 MED ORDER — ATORVASTATIN CALCIUM 40 MG PO TABS
40.0000 mg | ORAL_TABLET | Freq: Every day | ORAL | Status: DC
  Administered 2022-05-08 – 2022-05-21 (×12): 40 mg via ORAL
  Filled 2022-05-08 (×14): qty 1

## 2022-05-08 MED ORDER — METOPROLOL SUCCINATE ER 25 MG PO TB24
25.0000 mg | ORAL_TABLET | Freq: Every day | ORAL | Status: DC
  Administered 2022-05-08 – 2022-05-20 (×7): 25 mg via ORAL
  Filled 2022-05-08 (×12): qty 1

## 2022-05-08 MED ORDER — ASPIRIN 81 MG PO TBEC
81.0000 mg | DELAYED_RELEASE_TABLET | Freq: Every day | ORAL | Status: DC
  Administered 2022-05-08 – 2022-05-21 (×11): 81 mg via ORAL
  Filled 2022-05-08 (×14): qty 1

## 2022-05-08 MED ORDER — TRAZODONE HCL 50 MG PO TABS
50.0000 mg | ORAL_TABLET | Freq: Every evening | ORAL | Status: DC | PRN
  Administered 2022-05-10 – 2022-05-20 (×9): 50 mg via ORAL
  Filled 2022-05-08 (×9): qty 1

## 2022-05-08 NOTE — ED Triage Notes (Signed)
Pt has had continual episodes of outbursts in the lobby. Pt states she feels that other people are going to attack her and she need to hurt them. Pt also states she wants evaluation of her schizophrenia. EDP in triage notified of the same.
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
## 2022-05-08 NOTE — ED Notes (Signed)
TTS recommends in pt treatment. Dr. Bebe Shaggy made aware of same
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
## 2022-05-08 NOTE — ED Notes (Signed)
TTS machine at bedside at this time per psych request

## 2022-05-08 NOTE — ED Notes (Signed)
Received verbal report from Katrina W RN at this time 

## 2022-05-08 NOTE — ED Notes (Signed)
Pt dressed out in purple scrubs. Pt belongings placed in locker #2

## 2022-05-08 NOTE — BH Assessment (Deleted)
TTS attempted TTS assessment, currently TTS cart in use at this time, will revisit at a later time.

## 2022-05-08 NOTE — BH Assessment (Addendum)
NT Alroy Dust informed clinician that patient had been given something to help with sleep earlier.  She is too sleepy to be assessed now.

## 2022-05-08 NOTE — BH Assessment (Signed)
TTS attempted to see Pt, will follow up with nurse.

## 2022-05-09 DIAGNOSIS — F200 Paranoid schizophrenia: Secondary | ICD-10-CM | POA: Diagnosis not present

## 2022-05-09 LAB — RAPID URINE DRUG SCREEN, HOSP PERFORMED
Amphetamines: NOT DETECTED
Barbiturates: NOT DETECTED
Benzodiazepines: NOT DETECTED
Cocaine: NOT DETECTED
Opiates: NOT DETECTED
Tetrahydrocannabinol: NOT DETECTED

## 2022-05-09 LAB — RESP PANEL BY RT-PCR (FLU A&B, COVID) ARPGX2
Influenza A by PCR: NEGATIVE
Influenza B by PCR: NEGATIVE
SARS Coronavirus 2 by RT PCR: POSITIVE — AB

## 2022-05-09 NOTE — Progress Notes (Signed)
Inpatient Behavioral Health Placement  Meets inpatient criteria per Sindy Guadeloupe, NP. There are no available beds at [facility] per [facility], RN.  Referral was sent to the following facilities;   Destination Service Provider Address Phone Fax  [facility]  [address], [place] [address] [phone]  [facility]  [address] [place] [address] [phone]  [facility]  [address] [place], [place] [address] [phone]  [facility]  [address] [place], [place] [address] [phone]  [facility]  [address] [place] [address] [phone]  [facility]  [address] [place] [address] [phone]  [facility]  [address], [place] [address] [phone]  [facility]  [address] [place] [address] [phone]  [facility]  [address] [place] [address] [phone]  [facility]  [address] [place] [address] [phone]  [facility]  [address] [place] [address] [phone]  [facility]  [address] [place] [address] [phone]  [facility]  [address] [place] [address] [phone]    Situation ongoing,  CSW will follow up.   Maryjean Ka, MSW, LCSWA 05/09/2022  @ 5:18 PM

## 2022-05-09 NOTE — Progress Notes (Signed)
Patient has been denied by [facility] due to no appropriate beds. Patient meets BH inpatient criteria per Sindy Guadeloupe, NP. Patient has been faxed out to the following facilities:    [facility]  [address], [place] [address] [phone]  [facility]  [address] [place] [address] [phone]  [facility]  [address] [place], [place] [address] [phone]  [facility]  [address] [place], [place] [address] [phone]  [facility]  [address] [place] [address] [phone]  [facility]  [address] [place] [address] [phone]  [facility]  [address], [place] [address] [phone]  [facility]  [address] [place] [address] [phone]  [facility]  [address], [place] [address] [phone]  [facility]  [address] [place] [address] [phone]  [facility]  [address] [place] [address] [phone]  [facility]  [address] [place] [address] [phone]  [facility]  [address] [place] [address] [phone]   Damita Dunnings, MSW, LCSW-A  8:20 PM 05/09/2022
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
## 2022-05-09 NOTE — ED Notes (Signed)
Verbal report given to April O RN

## 2022-05-09 NOTE — ED Notes (Signed)
Patient refusing all medications at this time.

## 2022-05-10 DIAGNOSIS — F22 Delusional disorders: Secondary | ICD-10-CM | POA: Diagnosis not present

## 2022-05-10 DIAGNOSIS — F600 Paranoid personality disorder: Secondary | ICD-10-CM | POA: Diagnosis not present

## 2022-05-10 DIAGNOSIS — F200 Paranoid schizophrenia: Secondary | ICD-10-CM | POA: Diagnosis not present

## 2022-05-10 MED ORDER — ONDANSETRON 4 MG PO TBDP
4.0000 mg | ORAL_TABLET | Freq: Three times a day (TID) | ORAL | Status: DC | PRN
  Administered 2022-05-15: 4 mg via ORAL
  Filled 2022-05-10: qty 1

## 2022-05-10 NOTE — Progress Notes (Signed)
Per Ophelia Shoulder, NP, patient meets criteria for inpatient treatment. There are no available beds at [facility] today. CSW faxed referrals to the following facilities for review:  [facility]  Pending - Request Sent N/A [address] [place] [address] [phone] --  [facility]  Pending - Request Sent N/A [address] [place] [address] [phone] --  [facility]  Pending - Request Sent N/A [address] [place], [place] [address] [phone] --  [facility]  Pending - Request Sent N/A [address], [place] [address] [phone] --  [facility]  Pending - Request Sent N/A [address] [place] [address] [phone] --  [facility]  Pending - Request Sent N/A [address], [place] [address] [phone] --  [facility]  Pending - Request Sent N/A [address] [place] [address] [phone] --  [facility]  Pending - Request Sent N/A [address] [place] [address] [phone] --  [facility]  Pending - Request Sent N/A [address] [place] [address] [phone] --  [facility]  Pending - Request Sent N/A [address] [place] [address] [phone] --  [facility]  Pending - Request Sent N/A [address] [name] [place] [address] [phone] --  [facility]  Pending - Request Sent N/A [address] [place] [address] [phone] --  [facility]  Pending - Request Sent N/A [address] [place] [address] [phone] --  [facility]  Pending - Request Sent N/A [address] [place] [address] [phone] --  [facility]  Pending - Request Sent N/A [address], [place] [address] [phone] --  [facility]  Pending - Request Sent N/A [address] [place] [address] [phone] --  [facility]  Pending - Request Sent N/A [address] [place] [address] [phone] --  [facility]  Pending - Request Sent N/A [address] [place], [place] [address] [phone] --  [facility]  Pending - Request Sent N/A [address] [place] [address] [phone] --  [facility]  Pending - Request Sent N/A [address], [place] [address] [phone] --  [facility]  Pending - Request Sent N/A [address] [place] [address] [phone] --   TTS will continue to seek bed placement.  Crissie Reese, MSW, Lenice Pressman Phone: [phone] Disposition/TOC

## 2022-05-10 NOTE — ED Notes (Signed)
Pt noted to intermittently pace the room and stare out the window.  Pt denied needs and when offered a drink sts "just get me whatever you want."  Pt calm and cooperative.

## 2022-05-10 NOTE — ED Notes (Signed)
Pt given toiletries and clean scrubs, so she could shower.  Bedside cleaning completed by NT/Sitter.

## 2022-05-10 NOTE — ED Notes (Signed)
Pt provided breakfast tray.  Currently, denies needs.
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
## 2022-05-10 NOTE — Consult Note (Signed)
[facility] ED ASSESSMENT   Reason for Consult:  Paranoia Referring Physician:  Dr. Denton Lank Patient Identification: Kathy Brown MRN:  [id number] ED Chief Complaint: Paranoid schizophrenia [facility])  Diagnosis:  Principal Problem:   Paranoid schizophrenia [facility])   ED Assessment Time Calculation: Start Time: 1230 Stop Time: 1300 Total Time in Minutes (Assessment Completion): 30   Subjective:   Kathy Brown is a 41 y.o. female patient with hx of schizophrenia presents to [facility] with auditory hallucinations, and feeling paranoid and that her food had been poisoned. Her mother reports she has not been sleeping for days. Mother also reports pt has not taken medications in around 1 month.   HPI:   Patient seen today in her room at [facility] for face to face evaluation. She is covid positive, and has been isolating in her room. She is very minimal with me, and usually only responded with "yes" or "no" to my questions. When I asked her why she came to the hospital she shrugged her shoulders. I asked if she is still feeling like people are trying to hurt her, she says "yes." I asked if she felt safe in the hospital and she said "no." Her appetite has increased, her breakfast tray was beside of her bed and it looked like she ate 100% of breakfast. She denies any suicidal or homicidal thoughts. She denies current auditory or visual hallucinations. She does appear to be responding to internal stimuli, at times through out assessment she would quickly look to the wall or around the room. It appears patient is not at her baseline due to the report from her mother. Her mother mentioned she has been off medications for around 1 month, and has grown paranoid and agitated at home. She became aggressive with her family and had not slept in a few days prior to coming to the hospital.   Pt refused all medications yesterday. When asked why she would do this she has no response for me. Talked to patient about the importance of  medication adherence inside and outside the hospital. She appeared to process what I was saying well, pt was compliant with all medications this morning. Patient has shown some improvements since hospital admission. Our conversation was linear, she is oriented x3, and denies any hallucinations at this time. Will continue current medication regimen and continue to recommend IP treatment at this time.   Past Psychiatric History:  - significant hx of paranoid schizophrenia, medication noncompliance, and agitation -previous inpatient psychiatric hospitalizations  Risk to Self or Others: Is the patient at risk to self? Yes Has the patient been a risk to self in the past 6 months? No Has the patient been a risk to self within the distant past? No Is the patient a risk to others? Yes Has the patient been a risk to others in the past 6 months? No Has the patient been a risk to others within the distant past? No  [place] Scale:  Flowsheet Row ED from 05/07/2022 in [facility] EMERGENCY DEPARTMENT ED to Hosp-Admission (Discharged) from 01/23/2022 in [facility] ED from 11/21/2021 in [facility]  C-SSRS RISK CATEGORY No Risk No Risk No Risk        Substance Abuse:  Alcohol / Drug Use Pain Medications: See MAR Prescriptions: See MAR Over the Counter: See MAR History of alcohol / drug use?: No history of alcohol / drug abuse Longest period of sobriety (when/how long): NA Withdrawal Symptoms: None  Past  Medical History:  Past Medical History:  Diagnosis Date   A-fib [facility])    Anxiety    Atrial fibrillation with RVR (HCC) 10/15/2017   Auditory hallucinations    Chronic back pain    Dysrhythmia    Atrial fibrillation   Paranoid schizophrenia (HCC)    Psychosis (HCC) 03/25/2014   Schizoaffective disorder (HCC) 03/25/2014   Schizophrenia (HCC)    Urinary incontinence     Past Surgical History:  Procedure Laterality Date   TOOTH  EXTRACTION Bilateral 09/08/2018   Procedure: DENTAL RESTORATION/EXTRACTIONS;  Surgeon: Ocie Doyne, DDS;  Location: [facility];  Service: Oral Surgery;  Laterality: Bilateral;   Family History:  Family History  Problem Relation Age of Onset   Hypertension Mother    Diabetes Mother     Social History:  Social History   Substance and Sexual Activity  Alcohol Use No     Social History   Substance and Sexual Activity  Drug Use No    Social History   Socioeconomic History   Marital status: Single    Spouse name: Not on file   Number of children: Not on file   Years of education: Not on file   Highest education level: Not on file  Occupational History   Not on file  Tobacco Use   Smoking status: Never   Smokeless tobacco: Never  Vaping Use   Vaping Use: Never used  Substance and Sexual Activity   Alcohol use: No   Drug use: No   Sexual activity: Not Currently    Birth control/protection: None  Other Topics Concern   Not on file  Social History Narrative   Lives mom, dad, grandmother, and three kids   Social Determinants of Health   Financial Resource Strain: Not on file  Food Insecurity: Not on file  Transportation Needs: Not on file  Physical Activity: Not on file  Stress: Not on file  Social Connections: Not on file   Additional Social History:    Allergies:  No Known Allergies  Labs:  Results for orders placed or performed during the hospital encounter of 05/07/22 (from the past 48 hour(s))  Urine rapid drug screen (hosp performed)     Status: None   Collection Time: 05/09/22 11:00 AM  Result Value Ref Range   Opiates NONE DETECTED NONE DETECTED   Cocaine NONE DETECTED NONE DETECTED   Benzodiazepines NONE DETECTED NONE DETECTED   Amphetamines NONE DETECTED NONE DETECTED   Tetrahydrocannabinol NONE DETECTED NONE DETECTED   Barbiturates NONE DETECTED NONE DETECTED    Comment: (NOTE) DRUG SCREEN FOR MEDICAL PURPOSES ONLY.  IF  CONFIRMATION IS NEEDED FOR ANY PURPOSE, NOTIFY LAB WITHIN 5 DAYS.  LOWEST DETECTABLE LIMITS FOR URINE DRUG SCREEN Drug Class                     Cutoff (ng/mL) Amphetamine and metabolites    1000 Barbiturate and metabolites    200 Benzodiazepine                 200 Tricyclics and metabolites     300 Opiates and metabolites        300 Cocaine and metabolites        300 THC                            50 Performed at [facility], [address], [place], [place] [address]  Current Facility-Administered Medications  Medication Dose Route Frequency Provider Last Rate Last Admin   aspirin EC tablet 81 mg  81 mg Oral Daily Cathren Laine, MD   81 mg at 05/10/22 0943   atorvastatin (LIPITOR) tablet 40 mg  40 mg Oral Daily Cathren Laine, MD   40 mg at 05/10/22 0942   benztropine (COGENTIN) tablet 0.5 mg  0.5 mg Oral BID PRN Cathren Laine, MD       digoxin Margit Banda) tablet 0.25 mg  0.25 mg Oral Daily Cathren Laine, MD   0.25 mg at 05/10/22 7829   hydrOXYzine (ATARAX) tablet 25 mg  25 mg Oral TID PRN Cathren Laine, MD   25 mg at 05/08/22 1329   metoprolol succinate (TOPROL-XL) 24 hr tablet 25 mg  25 mg Oral Daily Cathren Laine, MD   25 mg at 05/10/22 0943   ondansetron (ZOFRAN-ODT) disintegrating tablet 4 mg  4 mg Oral Q8H PRN Jacalyn Lefevre, MD       risperiDONE (RISPERDAL) tablet 1 mg  1 mg Oral BID Cathren Laine, MD   1 mg at 05/10/22 5621   traZODone (DESYREL) tablet 50 mg  50 mg Oral QHS PRN Cathren Laine, MD       Current Outpatient Medications  Medication Sig Dispense Refill   aspirin EC 81 MG EC tablet Take 1 tablet (81 mg total) by mouth daily. Swallow whole. 30 tablet 11   atorvastatin (LIPITOR) 40 MG tablet Take 1 tablet (40 mg total) by mouth daily. 30 tablet 0   benztropine (COGENTIN) 0.5 MG tablet Take 1 tablet (0.5 mg total) by mouth 2 (two) times daily as needed for tremors (Extraparametal side effects). 60 tablet 0   digoxin (LANOXIN) 0.25 MG tablet Take 1 tablet  (0.25 mg total) by mouth daily. 30 tablet 0   hydrOXYzine (ATARAX) 25 MG tablet Take 1 tablet (25 mg total) by mouth 3 (three) times daily as needed for anxiety. 30 tablet 0   metoprolol succinate (TOPROL-XL) 25 MG 24 hr tablet Take 1 tablet (25 mg total) by mouth daily. 30 tablet 0   polyethylene glycol (MIRALAX / GLYCOLAX) 17 g packet Take 17 g by mouth daily. (Patient taking differently: Take 17 g by mouth daily as needed for mild constipation.) 14 each 0   risperiDONE (RISPERDAL) 1 MG tablet Take 1 tablet (1 mg total) by mouth 2 (two) times daily. 60 tablet 0   traZODone (DESYREL) 50 MG tablet Take 1 tablet (50 mg total) by mouth at bedtime as needed for sleep. 30 tablet 30     Psychiatric Specialty Exam: Presentation  General Appearance: Fairly Groomed  Eye Contact:Fair  Speech:Clear and Coherent  Speech Volume:Decreased  Handedness:Right   Mood and Affect  Mood:Euthymic  Affect:Congruent   Thought Process  Thought Processes:Coherent  Descriptions of Associations:Intact  Orientation:Full (Time, Place and Person)  Thought Content:Delusions  History of Schizophrenia/Schizoaffective disorder:Yes  Duration of Psychotic Symptoms:Greater than six months  Hallucinations:Hallucinations: None  Ideas of Reference:None  Suicidal Thoughts:Suicidal Thoughts: No  Homicidal Thoughts:Homicidal Thoughts: No   Sensorium  Memory:Immediate Fair  Judgment:Impaired  Insight:Poor   Executive Functions  Concentration:Fair  Attention Span:Fair  Recall:Fair  Fund of Knowledge:Fair  Language:Fair   Psychomotor Activity  Psychomotor Activity:Psychomotor Activity: Normal   Assets  Assets:Desire for Improvement; Leisure Time; Physical Health; Resilience; Social Support    Sleep  Sleep:Sleep: Fair   Physical Exam: Physical Exam Neurological:     Mental Status: She is alert and oriented to person, place, and  time.  Psychiatric:        Thought Content:  Thought content is paranoid and delusional.    Review of Systems  Constitutional:  Positive for malaise/fatigue.  Psychiatric/Behavioral:         Paranoia   All other systems reviewed and are negative.  Blood pressure 122/80, pulse 65, temperature 98.2 F (36.8 C), temperature source Oral, resp. rate 16, SpO2 100 %. There is no height or weight on file to calculate BMI.  Medical Decision Making: At this time, will continue to recommend patient for IP treatment. RN, EDP, and LCSW notified of disposition. If no [facility] beds available, will fax out.   - no medication changes at this time  Disposition: Recommend psychiatric Inpatient admission when medically cleared.  Eligha Bridegroom, NP 05/10/2022 3:58 PM

## 2022-05-11 DIAGNOSIS — F600 Paranoid personality disorder: Secondary | ICD-10-CM | POA: Diagnosis not present

## 2022-05-11 DIAGNOSIS — F22 Delusional disorders: Secondary | ICD-10-CM | POA: Diagnosis not present

## 2022-05-11 DIAGNOSIS — F200 Paranoid schizophrenia: Secondary | ICD-10-CM | POA: Diagnosis not present

## 2022-05-11 MED ORDER — MIDODRINE HCL 5 MG PO TABS
10.0000 mg | ORAL_TABLET | Freq: Three times a day (TID) | ORAL | Status: AC
  Administered 2022-05-12 – 2022-05-14 (×7): 10 mg via ORAL
  Filled 2022-05-11 (×12): qty 2

## 2022-05-11 NOTE — ED Notes (Signed)
RN informed of Kathy Brown of pt manual BP of 86/58. PA made aware of pt refusal for EKG. Pt was tearful. PA informed RN to hold dig and metropolol, and PA will order midodrine for pt BP. Plan of care continues.

## 2022-05-11 NOTE — ED Notes (Signed)
Writer was told to get an EKG on pt and a manual BP. Deonna EMT took pt's manual BP, while this was happening, Clinical research associate was getting the EKG ready. Pt has been calm and complaint all morning before the attempt of getting the EKG. When the manual BP was taken, writer asked pt if it was okay to get an EKG on pt. Pt shook her head, indicating 'no'. Writer tried to explain what an EKG was and how important it was. Pt began saying "What are you going to do? I've done nothing wrong. I just want to lie down and sleep, I'm not bothering anybody". Pt began crying hysterically, then walked to a corner and urinated on herself. Writer and Deonna EMT attempted to calm pt down, pt continued to cry and yell out. Writer and EMT left the room, as soon as the writer closed the door, pt stopped crying. Pt pacing around the room and looking outside of her window into the purple zone. RN made aware of this incident.

## 2022-05-11 NOTE — ED Notes (Signed)
Writer got pt's vitals per requested by RN. Pt's BP was low. (81/52, 84/54, 94/51). BP taken on both arms and with a regular and small BP cuff. Writer asked pt if she felt fine, pt shook her head indicating 'no'. Writer was told by RN to provide pt with 2 cups of water, writer asked pt if she could drink/if she is thirsty which pt responded with 'no'. RN made aware of pt's BP and not wanting to drink her water. Pt continues to walk around her room, look out of the window and stare

## 2022-05-11 NOTE — ED Notes (Signed)
Pt refusing medication. RN and tech attempted to encourage and educate pt about medication and the importance of taking due to her blood pressure. Pt refused. EDP made aware of pt refusal.

## 2022-05-11 NOTE — ED Notes (Signed)
EDP at bedside rounding on pt.

## 2022-05-11 NOTE — ED Notes (Signed)
RN made aware of pt automatic BP reading in the 80s systolic. RN made Ray MD and Eulah Pont PA aware. RN advised to provide pt with water and recheck. Pt is alert and oriented and walking around in room.
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
## 2022-05-11 NOTE — ED Notes (Signed)
Patiently is currently walking back and forth in her room while staring through the window. Patient is calm otherwise.

## 2022-05-11 NOTE — ED Notes (Signed)
Pt received their lunch.

## 2022-05-11 NOTE — ED Notes (Signed)
RN advised to repeate EKG and manual BP.
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
## 2022-05-11 NOTE — Consult Note (Signed)
  Patient seen in her room this morning for face-to-face evaluation.  She would not speak to me nor engage much in conversation this morning.  I asked her if she is experiencing any auditory or visual hallucinations, she shook her head indicating no.  Denies current suicidal or homicidal ideations by shaking her head no.  I asked her if she still felt like people were trying to hurt her and she shrugged her shoulders.  Chart indicates that she was compliant with all medications yesterday will continue current medication regimen.  Will continue to recommend for inpatient psychiatric treatment.  There are no current beds available at [facility], will fax patient out for further search.

## 2022-05-11 NOTE — ED Notes (Signed)
Attempted to do EKG. Pt is upset and crying at this time. Patient also urinated on herself shortly after crying. Sitter and I went inside of room to obtain manual BP. 86/58 L arm. Will re-attempt EKG as requested.  RN is aware and PA notified.

## 2022-05-12 DIAGNOSIS — F600 Paranoid personality disorder: Secondary | ICD-10-CM | POA: Diagnosis not present

## 2022-05-12 DIAGNOSIS — F22 Delusional disorders: Secondary | ICD-10-CM | POA: Diagnosis not present

## 2022-05-12 DIAGNOSIS — F200 Paranoid schizophrenia: Secondary | ICD-10-CM | POA: Diagnosis not present

## 2022-05-12 MED ORDER — RISPERIDONE 1 MG PO TABS
2.0000 mg | ORAL_TABLET | Freq: Every day | ORAL | Status: DC
  Administered 2022-05-12 – 2022-05-20 (×9): 2 mg via ORAL
  Filled 2022-05-12 (×9): qty 2

## 2022-05-12 MED ORDER — RISPERIDONE 1 MG PO TABS
1.0000 mg | ORAL_TABLET | Freq: Every day | ORAL | Status: DC
  Administered 2022-05-13 – 2022-05-21 (×8): 1 mg via ORAL
  Filled 2022-05-12 (×8): qty 1

## 2022-05-12 NOTE — ED Notes (Signed)
Patient noted to be intermittently pacing around room. No needs expressed at this time by the patient.

## 2022-05-12 NOTE — Progress Notes (Signed)
[facility] Psych ED Progress Note  05/12/2022 11:09 AM Kathy Brown  MRN:  [id number]   Subjective:   Patient seen this morning in her room for face-to-face evaluation.  She will not verbally respond to any of my questions but was able to shake her head indicating yes or no to direct questions I asked.  She denies any suicidal or homicidal ideations.  Denies any auditory or visual hallucinations.  Denies feeling like anyone is trying to harm her today.  I attempted to ask her why she would not take her medications yesterday but she just stared at me and had no response.  Patient was compliant with medications this morning.  Patient's breakfast tray is beside of her it looks like she ate most of her meal.  She shook her head indicating yes when I asked her if she is sleeping well.  Patient was given as needed hydroxyzine this morning due to continuous pacing in her room.  She has had no aggression or anger outburst while in the emergency department.  She has mostly been cooperative and pleasant with staff.  Will continue to recommend inpatient psychiatric treatment.  Principal Problem: Paranoid schizophrenia (HCC) Diagnosis:  Principal Problem:   Paranoid schizophrenia (HCC)   ED Assessment Time Calculation: Start Time: 1000 Stop Time: 1020 Total Time in Minutes (Assessment Completion): 20   Past Psychiatric History:  See previous documentation  [place] Scale:  Flowsheet Row ED from 05/07/2022 in [facility] EMERGENCY DEPARTMENT ED to Hosp-Admission (Discharged) from 01/23/2022 in [facility] ED from 11/21/2021 in [facility]  C-SSRS RISK CATEGORY No Risk No Risk No Risk       Past Medical History:  Past Medical History:  Diagnosis Date   A-fib [facility])    Anxiety    Atrial fibrillation with RVR (HCC) 10/15/2017   Auditory hallucinations    Chronic back pain    Dysrhythmia    Atrial fibrillation   Paranoid schizophrenia  (HCC)    Psychosis (HCC) 03/25/2014   Schizoaffective disorder (HCC) 03/25/2014   Schizophrenia (HCC)    Urinary incontinence     Past Surgical History:  Procedure Laterality Date   TOOTH EXTRACTION Bilateral 09/08/2018   Procedure: DENTAL RESTORATION/EXTRACTIONS;  Surgeon: Ocie Doyne, DDS;  Location: [facility];  Service: Oral Surgery;  Laterality: Bilateral;   Family History:  Family History  Problem Relation Age of Onset   Hypertension Mother    Diabetes Mother     Social History:  Social History   Substance and Sexual Activity  Alcohol Use No     Social History   Substance and Sexual Activity  Drug Use No    Social History   Socioeconomic History   Marital status: Single    Spouse name: Not on file   Number of children: Not on file   Years of education: Not on file   Highest education level: Not on file  Occupational History   Not on file  Tobacco Use   Smoking status: Never   Smokeless tobacco: Never  Vaping Use   Vaping Use: Never used  Substance and Sexual Activity   Alcohol use: No   Drug use: No   Sexual activity: Not Currently    Birth control/protection: None  Other Topics Concern   Not on file  Social History Narrative   Lives mom, dad, grandmother, and three kids   Social Determinants of Health   Financial Resource Strain: Not on  file  Food Insecurity: Not on file  Transportation Needs: Not on file  Physical Activity: Not on file  Stress: Not on file  Social Connections: Not on file    Sleep: Fair  Appetite:  Good  Current Medications: Current Facility-Administered Medications  Medication Dose Route Frequency Provider Last Rate Last Admin   aspirin EC tablet 81 mg  81 mg Oral Daily Cathren Laine, MD   81 mg at 05/12/22 0805   atorvastatin (LIPITOR) tablet 40 mg  40 mg Oral Daily Cathren Laine, MD   40 mg at 05/12/22 0805   benztropine (COGENTIN) tablet 0.5 mg  0.5 mg Oral BID PRN Cathren Laine, MD       digoxin  Margit Banda) tablet 0.25 mg  0.25 mg Oral Daily Cathren Laine, MD   0.25 mg at 05/12/22 0805   hydrOXYzine (ATARAX) tablet 25 mg  25 mg Oral TID PRN Cathren Laine, MD   25 mg at 05/12/22 0805   metoprolol succinate (TOPROL-XL) 24 hr tablet 25 mg  25 mg Oral Daily Cathren Laine, MD   25 mg at 05/12/22 0805   midodrine (PROAMATINE) tablet 10 mg  10 mg Oral TID WC Army Melia A, PA-C   10 mg at 05/12/22 0805   ondansetron (ZOFRAN-ODT) disintegrating tablet 4 mg  4 mg Oral Q8H PRN Jacalyn Lefevre, MD       [START ON 05/13/2022] risperiDONE (RISPERDAL) tablet 1 mg  1 mg Oral Daily Eligha Bridegroom, NP       risperiDONE (RISPERDAL) tablet 2 mg  2 mg Oral QHS Eligha Bridegroom, NP       traZODone (DESYREL) tablet 50 mg  50 mg Oral QHS PRN Cathren Laine, MD   50 mg at 05/10/22 2121   Current Outpatient Medications  Medication Sig Dispense Refill   aspirin EC 81 MG EC tablet Take 1 tablet (81 mg total) by mouth daily. Swallow whole. 30 tablet 11   atorvastatin (LIPITOR) 40 MG tablet Take 1 tablet (40 mg total) by mouth daily. 30 tablet 0   benztropine (COGENTIN) 0.5 MG tablet Take 1 tablet (0.5 mg total) by mouth 2 (two) times daily as needed for tremors (Extraparametal side effects). 60 tablet 0   digoxin (LANOXIN) 0.25 MG tablet Take 1 tablet (0.25 mg total) by mouth daily. 30 tablet 0   hydrOXYzine (ATARAX) 25 MG tablet Take 1 tablet (25 mg total) by mouth 3 (three) times daily as needed for anxiety. 30 tablet 0   metoprolol succinate (TOPROL-XL) 25 MG 24 hr tablet Take 1 tablet (25 mg total) by mouth daily. 30 tablet 0   polyethylene glycol (MIRALAX / GLYCOLAX) 17 g packet Take 17 g by mouth daily. (Patient taking differently: Take 17 g by mouth daily as needed for mild constipation.) 14 each 0   risperiDONE (RISPERDAL) 1 MG tablet Take 1 tablet (1 mg total) by mouth 2 (two) times daily. 60 tablet 0   traZODone (DESYREL) 50 MG tablet Take 1 tablet (50 mg total) by mouth at bedtime as needed for sleep. 30  tablet 30    Lab Results: No results found for this or any previous visit (from the past 48 hour(s)).  Blood Alcohol level:  Lab Results  Component Value Date   [facility] <10 05/08/2022   ETH <10 01/23/2022     Psychiatric Specialty Exam:  Presentation  General Appearance: Fairly Groomed  Eye Contact:Fair  Speech:Other (comment) (would not verbally speak)  Speech Volume:Decreased  Handedness:Right   Mood and Affect  Mood:Euthymic  Affect:Flat   Thought Process  Thought Processes:Coherent  Descriptions of Associations:-- (pt would not speak)  Orientation:-- (would not speak)  Thought Content:-- (would not speak)  History of Schizophrenia/Schizoaffective disorder:Yes  Duration of Psychotic Symptoms:Greater than six months  Hallucinations:Hallucinations: None  Ideas of Reference:None  Suicidal Thoughts:Suicidal Thoughts: No  Homicidal Thoughts:Homicidal Thoughts: No   Sensorium  Memory:-- (N/A)  Judgment:-- (N/A)  Insight:Poor   Executive Functions  Concentration:Fair  Attention Span:Fair  Recall:Fair  Fund of Knowledge:Fair  Language:Fair   Psychomotor Activity  Psychomotor Activity:Psychomotor Activity: Restlessness   Assets  Assets:Physical Health; Social Support   Sleep  Sleep:Sleep: Good    Physical Exam: Physical Exam Neurological:     Mental Status: She is alert.  Psychiatric:        Mood and Affect: Affect is flat.        Thought Content: Thought content normal.    Review of Systems  Psychiatric/Behavioral:         Bizarre, pacing in room, will not verbally speak with staff   Blood pressure 119/79, pulse 62, temperature (!) 97.4 F (36.3 C), temperature source Oral, resp. rate 16, SpO2 100 %. There is no height or weight on file to calculate BMI.   Medical Decision Making: Case reviewed and discussed with Dr. Lucianne Muss. Will continue to recommend psychiatric inpatient treatment. No current available beds at [facility].  Psychiatry CSW notified to seek placement.    Problem 1: Psychosis - increased Risperidone to 1mg  qAM and 2mg  qPM -Nursing staff to continue to encourage medication compliance   , NP 05/12/2022, 11:09 AM
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
## 2022-05-12 NOTE — ED Notes (Signed)
Pt was incontinent of urine. Pt bed was soaking wet. Changed by sitter at this time to a clean scrubs and bed linens changed.
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
## 2022-05-12 NOTE — Progress Notes (Signed)
Inpatient Behavioral Health Placement  Pt meets inpatient criteria per Eligha Bridegroom, NP. There are no available beds at [facility] per [facility]. Rona Ravens, RN Referral was sent to the following facilities;   Destination Service Provider Address Phone Fax  [facility]  [address] [place] [address] [phone]  [facility]  [address] [place] [address] [phone]  [facility]  [address] [place], [place] [address] [phone]  [facility]  [address] [place], [place] [address] [phone]  [facility]  [address] [place] [address] [phone]  [facility]  [address] [place] [address] [phone]  [facility]  [address], [place] [address] [phone]  [facility]  [address] [place] [address] [phone]  [facility]  [address] [place] [address] [phone]  [facility]  [address], [place] [address] [phone]  [facility]  [address] [place] [address] [phone]  [facility]  [address], [place] [address] [phone]  [facility]  [address] [place] [address] [phone]  [facility]  [address] [place] [address] [phone]  [facility]  [address] [place] [address] [phone]  [facility]  [address] [place] [address] [phone]  [facility]  [address], [place] [address] [phone]  [facility]  [address] [place], [place] [address] [phone]  [facility]  [address], [place] [address] [phone]  [facility]  [address] [place] [address] [phone]  [facility]  [address] [place] [address] [phone]  [facility]  [address] [place] [address] [phone]    Situation ongoing,  CSW will follow up.   Maryjean Ka, MSW, [facility] 05/12/2022  @ 12:26 PM

## 2022-05-12 NOTE — ED Notes (Signed)
Pt ate breakfast and has been pacing in her room. Pt constantly looks out by the door then paces again. Took her meds with no difficulties. Pt refuses to talk but nods to questions. Appears guarded but calm at this time. Offered change tv channel for pt.Safety precautions maintained. Hydroxyzine given PRN for anxiety as pt has been pacing the room. Will continue to monitor.

## 2022-05-13 DIAGNOSIS — F200 Paranoid schizophrenia: Secondary | ICD-10-CM | POA: Diagnosis not present

## 2022-05-13 MED ORDER — STERILE WATER FOR INJECTION IJ SOLN
INTRAMUSCULAR | Status: AC
  Filled 2022-05-13: qty 10

## 2022-05-13 MED ORDER — OLANZAPINE 5 MG PO TBDP
10.0000 mg | ORAL_TABLET | Freq: Three times a day (TID) | ORAL | Status: DC | PRN
  Administered 2022-05-17 – 2022-05-20 (×4): 10 mg via ORAL
  Filled 2022-05-13 (×4): qty 2

## 2022-05-13 MED ORDER — ZIPRASIDONE MESYLATE 20 MG IM SOLR: 10.0000 mg | INTRAMUSCULAR | Status: AC | PRN

## 2022-05-13 MED ORDER — LORAZEPAM 1 MG PO TABS
1.0000 mg | ORAL_TABLET | ORAL | Status: AC | PRN
  Administered 2022-05-17: 1 mg via ORAL
  Filled 2022-05-13: qty 1

## 2022-05-13 MED ORDER — ZIPRASIDONE MESYLATE 20 MG IM SOLR
INTRAMUSCULAR | Status: AC
  Administered 2022-05-13: 10 mg via INTRAMUSCULAR
  Filled 2022-05-13: qty 20

## 2022-05-13 NOTE — ED Notes (Signed)
Pt mother came in to visit at this time. Mother updated by this RN regarding treatment plan which is inpatient treatment recommended by psychiatry.

## 2022-05-13 NOTE — ED Notes (Signed)
Pt appears calm at this time. Ate dinner and went to the bathroom. Pt came back to bed and is now asleep. Will continue to monitor.

## 2022-05-13 NOTE — ED Notes (Signed)
Pt willingly took meds at this time. Bed linens changed. Pt was dry but was assisted by RN to go to the bathroom. Pt urinated with staff's encouragement. Will continue to monitor.

## 2022-05-13 NOTE — ED Notes (Signed)
Breakfast order placed ?

## 2022-05-13 NOTE — ED Notes (Signed)
Pt assisted to bedside commode at this time. Pt urinated with staff's encouragement. Bed linen changed at this time. Pt is continent when encouraged by staff to go to the bathroom regularly. Will continue to monitor.

## 2022-05-13 NOTE — ED Notes (Signed)
Pt took 2 pills and refused the others. Will continue to try to encourage pt to take meds. Pt has the table in front of her door to block staff from entering. RN has removed the table twice at this time. Pt continues to be paranoid and looks out the door frequently. Refuses to talk at this time. Nods when asked questions. Ate breakfast. Safety precautions maintained. Sitter 1:1 continued. Will continue to monitor.

## 2022-05-13 NOTE — ED Notes (Signed)
Pt mother left the unit at this time. Pt started crying and sobbing at this time. Pt is now more paranoid stating she needs to talk to the doctor because she has to go home. Pt kept on repeating, "I have to go. I have to go home." Dr.Dykstra at bedside. Will continue to monitor.

## 2022-05-13 NOTE — Consult Note (Signed)
  Patient seen in her room for face-to-face evaluation.  When I walk up to her room she has her bedside tray pushed up against the door as if she is barricading herself inside.  I pushed the door open and asked her if she is trying to keep people out of her room and she states no. Today's the first day she has spoken to me with words.  I asked her why she has this pushed against the door from the inside as if she is try to keep people out of her room and she states "I do not know I did not do it." She denies current suicidal or homicidal thoughts.  Denies current auditory or visual hallucinations.  Denies any paranoid thoughts such as someone trying to harm her.  I asked her why she used the restroom on herself yesterday and did not walk to the bathroom.  She states "it must be the food that they are giving me here."  She is still bizarre during conversation and will still randomly refuse medications.  At this time we will continue to recommend for inpatient psychiatric treatment.  She tested positive for COVID 5 days ago, has denied any symptoms.  EDP and RN notified of disposition.  CSW notified as there are no available beds at [facility] right now, patient will be faxed out.

## 2022-05-13 NOTE — Progress Notes (Signed)
Inpatient  Meets inpatient criteria per , NP.  Referral was sent to the following facilities;   Destination Service Provider Address Phone Fax  [facility]  [address] [place] [address] [phone]  [facility]  [address] [place] [address] [phone]  [facility]  [address] [place], [place] [address] [phone]  [facility]  [address] [place], [place] [address] [phone]  [facility]  [address] [place] [address] [phone]  [facility]  [address] [place] [address] [phone]  [facility]  [address], [place] [address] [phone]  [facility]  [address] [place] [address] [phone]  [facility]  [address] [place] [address] [phone]  [facility]  [address] [place] [address] [phone]  [facility]  [address] [place] [address] [phone]  [facility]  [address], [place] [address] [phone]  [facility]  [address] [place] [address] [phone]  [facility]  [address] [place] [address] [phone]  [facility]  [address], [place] [address] [phone]  [facility]  [address] [place] [address] [phone]  [facility]  [address] [place] [address] [phone]  [facility]  [address] [place], [place] [address] [phone]  [facility]  [address], [place] [address] [phone]  [facility]  [address] [place] [address] [phone]  [facility]  [address] [place] [address] [phone]  [facility]  [address] [place] [address] [phone]    Situation ongoing,  CSW will follow up.   Maryjean Ka, MSW, [facility]

## 2022-05-13 NOTE — ED Notes (Signed)
Pt continues to refuse meds at this time. Agreed to get VS taken. Will continue to monitor.

## 2022-05-13 NOTE — ED Notes (Signed)
Continues to refuse VS and meds at this time. Will continue to monitor.

## 2022-05-13 NOTE — ED Notes (Signed)
Pt started crying and screaming out loud how she does not want to be here. Pt pacing in the room and moving on bed. Observed to have increasing agitation. Security notified. Geodon IM given at this time. Pt refused and started walking away from staff in room. Security assisted pt back on bed. After the IM med given, pt started screaming more. Safety precautions maintained. Will continue to monitor.

## 2022-05-14 ENCOUNTER — Encounter (HOSPITAL_COMMUNITY): Payer: Self-pay | Admitting: Registered Nurse

## 2022-05-14 DIAGNOSIS — F22 Delusional disorders: Secondary | ICD-10-CM | POA: Diagnosis not present

## 2022-05-14 DIAGNOSIS — F200 Paranoid schizophrenia: Secondary | ICD-10-CM | POA: Diagnosis not present

## 2022-05-14 DIAGNOSIS — F600 Paranoid personality disorder: Secondary | ICD-10-CM | POA: Diagnosis not present

## 2022-05-14 NOTE — ED Notes (Signed)
Pt up out of bed intermittently walking to the door and standing at the door looking out the window.

## 2022-05-14 NOTE — ED Provider Notes (Signed)
Emergency Medicine Observation Re-evaluation Note  Kathy Brown is a 41 y.o. female, seen on rounds today.  Pt initially presented to the ED for complaints of behavioral health eval.  Patient with hx schizophrenia, and chronic/recurrent behavioral health issues related to same. Hx periodic non-compliance w meds.  Pt recently expressed desire to be able to return home.  BH re-eval pending.   Physical Exam  BP (!) 94/55   Pulse 80   Temp 97.8 F (36.6 C) (Oral)   Resp 18   SpO2 99%  Physical Exam General: alert, content.  Cardiac: regular rate. Lungs: breathing comfortably. Psych: alert, content. Responds very briefly to questions/conversation limited. No definite delusional thoughts or hallucinations noted. Pt does express desire to return home.  Does appear at times quietly apprehensive.  ED Course / MDM    I have reviewed the labs performed to date as well as medications administered while in observation.  Recent changes in the last 24 hours include ED obs, med management, BH reassessment.   Plan  Pt has been restarted on meds for several days now.   Over 5 days ago, did have a +covid test result, and has been completely asymptomatic and afebrile the entire time - she is entirely clear from covid perspective.   Not entire clear from chart review what patients behavioral health baseline is - pt does appear improved from initial.  BH team to reassess, including to comment on whether they feel incremental benefit to patient from inpatient [facility] stay vs transition to home focused on med compliance and close outpatient BH f/u.        Cathren Laine, MD 05/14/22 1244

## 2022-05-14 NOTE — Progress Notes (Signed)
Inpatient Behavioral Health Placement   Meets inpatient criteria per Eligha Bridegroom, NP. There are no available beds at [facility]. Referral was sent to the following facilities;  Situation ongoing,  CSW will follow up.  Destination Service Provider Address Phone Fax  [facility]  [address] [place] [address] [phone]  [facility]  [address] [place] [address] [phone]  [facility]  [address] [place], [place] [address] [phone]  [facility]  [address] [place], [place] [address] [phone]  [facility]  [address] [place] [address] [phone]  [facility]  [address] [place] [address] [phone]  [facility]  [address], [place] [address] [phone]  [facility]  [address] [place] [address] [phone]  [facility]  [address] [place] [address] [phone]  [facility]  [address], [place] [address] [phone]  [facility]  [address] [place] [address] [phone]  [facility]  [address], [place] [address] [phone]  [facility]  [address] [place] [address] [phone]  [facility]  [address] [place] [address] [phone]  [facility]  [address] [place] [address] [phone]  [facility]  [address] [place] [address] [phone]  [facility]  [address] [place] [address]  [phone]  [facility]  [address] [place], [place] [address] [phone]  [facility]  [address], [place] [address] [phone]  [facility]  [address], [place] [address] [phone]  [facility]  [address] [place] [address] [phone]  [facility]  [address], [place] [address] [phone]  [facility]  [address] [place] [address] [phone]  [facility]  [address], [place] [address] [phone]  [facility]  [address], [place] [address] [phone]  [facility]  [address] [place] [address] [phone]   Maryjean Ka, MSW, [facility] 05/14/2022  @ 10:56 AM

## 2022-05-14 NOTE — Progress Notes (Signed)
Patient has been denied by [facility] due to no appropriate beds available. Patient meets BH inpatient criteria per Eligha Bridegroom, NP. Patient has been faxed out to the following facilities:    [facility]  [address] [place] [address] [phone]  [facility]  [address] [place] [address] [phone]  [facility]  [address] [place], [place] [address] [phone]  [facility]  [address] [place], [place] [address] [phone]  [facility]  [address] [place] [address] [phone]  [facility]  [address] [place] [address] [phone]  [facility]  [address] [place] [address] [phone]  [facility]  [address] [place] [address] [phone]  [facility]  [address], [place] [address] [phone]  [facility]  [address] [place] [address] [phone]  [facility]  [address] [place] [address] [phone]  [facility]  [address] [place] [address] [phone]  [facility]  [address] [place] [address] [phone]  [facility]  [address] [place] [address] [phone]  [facility]  [address], [place] [address] [phone]  [facility]  [address] [place] [address] [phone]  [facility]  [address] [place] [address] [phone]  [facility]  [address] [place],  [place] [address] [phone]  [facility]  [address], [place] [address] [phone]  [facility]  [address] [place] [address] [phone]  [facility]  [address], [place] [address] [phone]  [facility]  [address] [place] [address] [phone]  [facility]  [address] [place] [address] [phone]  [facility]  [address], [place] [address] [phone]  [facility]  [address] [place] [address] [phone]  [facility]  [address] [place] [address] [phone]   Damita Dunnings, MSW, LCSW-A  9:27 PM 05/14/2022

## 2022-05-14 NOTE — ED Notes (Signed)
Received verbal report from Dana C RN at this time 

## 2022-05-14 NOTE — Consult Note (Signed)
  Kathy Brown is a 41 y.o. female patient with hx of schizophrenia presents to [facility] with auditory hallucinations, and feeling paranoid and that her food had been poisoned. Her mother reports she has not been sleeping for days. Mother also reports pt has not taken medications in around 1 month.    [name], 41 y.o., female patient seen face to face by this provider, consulted with Dr. Nelly Rout; and chart reviewed on 05/14/22.  On evaluation Kathy Brown is sitting up in bed patient only responding to questions with the shake of her head yes or no.  When asked if she has been going to the bathroom she shakes head yes and yes also to showering.  Very strong malodor to room of urine but unable to tell if patient had urinated on self or just needed a shower.   Spoke to patients nurse who reports that patient has been very calm with no behavioral outburst or any issues.  States patient has been taking her medications and was up to shower yesterday.  States that patient is not talking much and will only answer or speak when asked a question.   Patient continues to appear paranoid with bizarre behavior.  Will continue to recommend inpatient psychiatric treatment.    Medication Management  aspirin EC  81 mg Oral Daily   atorvastatin  40 mg Oral Daily   digoxin  0.25 mg Oral Daily   metoprolol succinate  25 mg Oral Daily   risperiDONE  1 mg Oral Daily   risperiDONE  2 mg Oral QHS  EKG Interpretation  Date/Time:  Thursday May 08 2022 04:15:18 EDT Ventricular Rate:  79 PR Interval:    QRS Duration: 78 QT Interval:  364 QTC Calculation: 417 R Axis:   80 Text Interpretation: Atrial fibrillation Abnormal ECG When compared with ECG of 26-Jan-2022 09:05, Nonspecific ST and T wave abnormality is no longer present HEART RATE has decreased Confirmed by Dione Booze ([id number]) on 05/08/2022 4:42:17 AM    No changes in treatment plan at this time.  Will continue to recommend inpatient psychiatric  treatment  Disposition: Recommend psychiatric Inpatient admission when medically cleared.    Secure message sent to patients nurse, social work/TOC and [facility] informing:  Psychiatric reassessment completed.  Patient continues to need inpatient psychiatric treatment.  If no [facility] at [facility] patient will need to be faxed out.     Pearse Shiffler B. Santana Edell, NP

## 2022-05-15 DIAGNOSIS — F200 Paranoid schizophrenia: Secondary | ICD-10-CM

## 2022-05-15 NOTE — Consult Note (Signed)
Telepsych Consultation   Reason for Consult:  Psychiatric Reassessment  Referring Physician:  Theron Arista, PA-C Location of Patient:    [facility] Location of Provider: Other: virtual home office  Patient Identification: Jamyria Ozanich MRN:  [id number] Principal Diagnosis: Paranoid schizophrenia (HCC) Diagnosis:  Principal Problem:   Paranoid schizophrenia (HCC)   Total Time spent with patient: 30 minutes  Subjective:   Jaelle Campanile is a 41 y.o. female patient with hx of schizophrenia presents to [facility] with auditory hallucinations, and feeling paranoid and that her food had been poisoned. Her mother reports she has not been sleeping for days. Mother also reports pt has not taken medications in around 1 month.   HPI: Patient seen via telepsych by this provider; chart reviewed and consulted with Dr. Lucianne Muss on 05/15/22.  On evaluation Patte Winkel states her name and reports she's doing okay today.  She speaks is a soft tone and sometimes speech appears garbled but is coherent when asked to repeat responses.  She is more alert and oriented today.  Pt now spontaneously starts and is able to carry conversation with this Clinical research associate. Which is a marked improvement from even yesterday.  Pt states she's tired of being in the hospital and would like to go home. She reports taking as scheduled and denies GI side effects or involuntary movements. States she is not sleeping good, reports this is because she's In the hospital.  Otherwise, states he does not have sleep concerns at home.  When asked about her appetite, states she cannot recall if she ate breakfast.   Pt no longer appears psychotic, and denies SI/HI.  Endorses chronic AH that she describes as "just sounds, they are not telling me to hurt myself."    Pt states she lives with her mother and upon discharge she plans to return there.  When asked is this Clinical research associate could call her mother for collateral, she is very guarded and declines.     Past  Psychiatric History: as outlined above  Risk to Self:  no Risk to Others:  no Prior Inpatient Therapy:  yes Prior Outpatient Therapy:   yes  Past Medical History:  Past Medical History:  Diagnosis Date   A-fib (HCC)    Anxiety    Atrial fibrillation with RVR (HCC) 10/15/2017   Auditory hallucinations    Chronic back pain    Dysrhythmia    Atrial fibrillation   Paranoid schizophrenia (HCC)    Psychosis (HCC) 03/25/2014   Schizoaffective disorder (HCC) 03/25/2014   Schizophrenia (HCC)    Urinary incontinence     Past Surgical History:  Procedure Laterality Date   TOOTH EXTRACTION Bilateral 09/08/2018   Procedure: DENTAL RESTORATION/EXTRACTIONS;  Surgeon: Ocie Doyne, DDS;  Location: [facility];  Service: Oral Surgery;  Laterality: Bilateral;   Family History:  Family History  Problem Relation Age of Onset   Hypertension Mother    Diabetes Mother    Family Psychiatric  History: unknown Social History:  Social History   Substance and Sexual Activity  Alcohol Use No     Social History   Substance and Sexual Activity  Drug Use No    Social History   Socioeconomic History   Marital status: Single    Spouse name: Not on file   Number of children: Not on file   Years of education: Not on file   Highest education level: Not on file  Occupational History   Not on file  Tobacco Use   Smoking  status: Never   Smokeless tobacco: Never  Vaping Use   Vaping Use: Never used  Substance and Sexual Activity   Alcohol use: No   Drug use: No   Sexual activity: Not Currently    Birth control/protection: None  Other Topics Concern   Not on file  Social History Narrative   Lives mom, dad, grandmother, and three kids   Social Determinants of Health   Financial Resource Strain: Not on file  Food Insecurity: Not on file  Transportation Needs: Not on file  Physical Activity: Not on file  Stress: Not on file  Social Connections: Not on file   Additional  Social History:    Allergies:  No Known Allergies  Labs: No results found for this or any previous visit (from the past 48 hour(s)).  Medications:  Current Facility-Administered Medications  Medication Dose Route Frequency Provider Last Rate Last Admin   aspirin EC tablet 81 mg  81 mg Oral Daily Lajean Saver, MD   81 mg at 05/15/22 0947   atorvastatin (LIPITOR) tablet 40 mg  40 mg Oral Daily Lajean Saver, MD   40 mg at 05/15/22 0947   benztropine (COGENTIN) tablet 0.5 mg  0.5 mg Oral BID PRN Lajean Saver, MD   0.5 mg at 05/13/22 0816   digoxin (LANOXIN) tablet 0.25 mg  0.25 mg Oral Daily Lajean Saver, MD   0.25 mg at 05/15/22 [id number]   hydrOXYzine (ATARAX) tablet 25 mg  25 mg Oral TID PRN Lajean Saver, MD   25 mg at 05/15/22 1454   OLANZapine zydis (ZYPREXA) disintegrating tablet 10 mg  10 mg Oral Q8H PRN Lucrezia Starch, MD       And   LORazepam (ATIVAN) tablet 1 mg  1 mg Oral PRN Lucrezia Starch, MD       metoprolol succinate (TOPROL-XL) 24 hr tablet 25 mg  25 mg Oral Daily Lajean Saver, MD   25 mg at 05/15/22 0948   ondansetron (ZOFRAN-ODT) disintegrating tablet 4 mg  4 mg Oral Q8H PRN Isla Pence, MD   4 mg at 05/15/22 1256   risperiDONE (RISPERDAL) tablet 1 mg  1 mg Oral Daily Vesta Mixer, NP   1 mg at 05/15/22 0948   risperiDONE (RISPERDAL) tablet 2 mg  2 mg Oral QHS Vesta Mixer, NP   2 mg at 05/14/22 2145   traZODone (DESYREL) tablet 50 mg  50 mg Oral QHS PRN Lajean Saver, MD   50 mg at 05/13/22 2138   Current Outpatient Medications  Medication Sig Dispense Refill   aspirin EC 81 MG EC tablet Take 1 tablet (81 mg total) by mouth daily. Swallow whole. 30 tablet 11   atorvastatin (LIPITOR) 40 MG tablet Take 1 tablet (40 mg total) by mouth daily. 30 tablet 0   benztropine (COGENTIN) 0.5 MG tablet Take 1 tablet (0.5 mg total) by mouth 2 (two) times daily as needed for tremors (Extraparametal side effects). 60 tablet 0   digoxin (LANOXIN) 0.25 MG tablet Take 1  tablet (0.25 mg total) by mouth daily. 30 tablet 0   hydrOXYzine (ATARAX) 25 MG tablet Take 1 tablet (25 mg total) by mouth 3 (three) times daily as needed for anxiety. 30 tablet 0   metoprolol succinate (TOPROL-XL) 25 MG 24 hr tablet Take 1 tablet (25 mg total) by mouth daily. 30 tablet 0   polyethylene glycol (MIRALAX / GLYCOLAX) 17 g packet Take 17 g by mouth daily. (Patient taking differently: Take 17 g by mouth  daily as needed for mild constipation.) 14 each 0   risperiDONE (RISPERDAL) 1 MG tablet Take 1 tablet (1 mg total) by mouth 2 (two) times daily. 60 tablet 0   traZODone (DESYREL) 50 MG tablet Take 1 tablet (50 mg total) by mouth at bedtime as needed for sleep. 30 tablet 30    Musculoskeletal: Strength & Muscle Tone: within normal limits Gait & Station: normal Patient leans: N/A          Psychiatric Specialty Exam:  Presentation  General Appearance: Casual  Eye Contact:Fair  Speech:Garbled (somewhat garbled, asked several time to repeat her statements)  Speech Volume:Decreased  Handedness:Right   Mood and Affect  Mood:Anxious  Affect:Flat   Thought Process  Thought Processes:Coherent  Descriptions of Associations:Intact  Orientation:Full (Time, Place and Person)  Thought Content:Logical (has improved since admission but not at baseline)  History of Schizophrenia/Schizoaffective disorder:Yes  Duration of Psychotic Symptoms:Greater than six months  Hallucinations:Hallucinations: Auditory Description of Auditory Hallucinations: "just noises" denies command hallucinations  Ideas of Reference:None  Suicidal Thoughts:Suicidal Thoughts: No  Homicidal Thoughts:Homicidal Thoughts: No   Sensorium  Memory:Immediate Good; Recent Fair; Remote Fair (improving as mental health improves with med adherence)  Judgment:Fair (in the setting of hx of medication non-compliance)  Insight:Fair   Executive Functions  Concentration:Good  Attention  Span:Good  [place]   Psychomotor Activity  Psychomotor Activity:Psychomotor Activity: Normal   Assets  Assets:Desire for Improvement; Housing; Social Support (states he lives with her mother who is supportive of her but does not give permission to phone her mother for collateral)   Sleep  Sleep:Sleep: [place] Number of Hours of Sleep: 6    Physical Exam: Physical Exam Cardiovascular:     Rate and Rhythm: Tachycardia present.  Musculoskeletal:     Cervical back: Normal range of motion.  Neurological:     Mental Status: She is alert.    Review of Systems  Constitutional: Negative.   Eyes: Negative.   Cardiovascular:        Pulse was elevated as of 0751 this morning; was not rechecked  Gastrointestinal: Negative.   Genitourinary: Negative.   Musculoskeletal: Negative.   Skin: Negative.   Endo/Heme/Allergies: Negative.    Blood pressure 93/60, pulse (!) 110, temperature (!) 97.5 F (36.4 C), temperature source Oral, resp. rate 18, SpO2 98 %. There is no height or weight on file to calculate BMI.  Treatment Plan Summary: Patient with schizophrenia hospitalized in the setting of medication non-adherence and mental decompensation.  Patient has improved admissions and being restarted on psych medications, but not quite at baseline. As sleep remains a concern, and can contribute to mental decline, recommend she receive prn trazodone 50mg  po tonight for sleep.  If sleep improves, she can be re-evaluated tomorrow for possible discharge.  Daily contact with patient to assess and evaluate symptoms and progress in treatment and Medication management Continue medications as prescribed.   Disposition: Recommend psychiatric Inpatient admission when medically cleared.  Secure message sent to patients nurse, social work/TOC and [facility]  [facility] informing:  Psychiatric reassessment completed.  Patient continues to need inpatient psychiatric  treatment.  If no available bed at [facility] patient will need to be faxed out.     This service was provided via telemedicine using a 2-way, interactive audio and video technology.  Names of all persons participating in this telemedicine service and their role in this encounter. Name: Gaylen Albee Role: Patient   Name: Merlyn Lot Role:  PMHNP    Chales Abrahams, NP 05/15/2022 4:41 PM

## 2022-05-15 NOTE — Consult Note (Signed)
Sent message to nurse, requesting to see pt for psych reassessment via TTS machine.  Nurse to locate the machine and alert this Clinical research associate.

## 2022-05-15 NOTE — Progress Notes (Signed)
Inpatient Behavioral Health Placement  Pt meets inpatient criteria per Ophelia Shoulder, NP. There are [facility] available beds at [facility]. Referral was sent to the following facilities;    Destination Service Provider Address Phone Fax  [facility]  [address] [place] [address] [phone]  [facility]  [address] [place] [address] [phone]  [facility]  [address] [place], [place] [address] [phone]  [facility]  [address] [place], [place] [address] [phone]  [facility]  [address] [place] [address] [phone]  [facility]  [address] [place] [address] [phone]  [facility]  [address] [place] [address] [phone]  [facility]  [address] [place] [address] [phone]  [facility]  [address], [place] [address] [phone]  [facility]  [address], [place] [address] [phone]  [facility]  [address] [place] [address] [phone]  [facility]  [address] [place] [address] [phone]  [facility]  [address] [place] [address] [phone]  [facility]  [address] [place] [address] [phone]  [facility]  [address] [place] [address] [phone]  [facility]  [address] [place] [address] [phone]  [address] [facility]  [address] [place] [address] [phone]   [facility]  [address] [place], [place] [address] [phone]  [facility]  [address] [place] [address] [phone]  [facility]  [address], [place] [address] [phone]  [facility]  [address] [place] [address] [phone]  [facility]  [address], [place] [address] [phone]  [facility]  [address] [place] [address] [phone]  [facility]  [address], [place] [address] [phone]  [facility]  [address] [place] [address] [phone]  [facility]  [address] [place] [address] [phone]  [facility]  [address] [place] [address] [phone]   Situation ongoing,  CSW will follow up.   Maryjean Ka, MSW, [facility] 05/15/2022  @ 12:24 PM

## 2022-05-15 NOTE — ED Notes (Signed)
Pt vomited in room 

## 2022-05-15 NOTE — ED Notes (Signed)
Pt consumed 50% Of breakfast. Pt calm and cooperative with staff. Asking if she is going to be going home. Updated pt on plane of care. Pt denies any SI/HI/AH/VH at this time.

## 2022-05-15 NOTE — ED Notes (Signed)
Verbal report given to Lorren B RN at this time 

## 2022-05-15 NOTE — ED Notes (Signed)
Pt packing around room. Looking out the glass. Pt denies any pain. Pt calm and cooperative with staff.

## 2022-05-16 DIAGNOSIS — F200 Paranoid schizophrenia: Secondary | ICD-10-CM | POA: Diagnosis not present

## 2022-05-16 DIAGNOSIS — F22 Delusional disorders: Secondary | ICD-10-CM | POA: Diagnosis not present

## 2022-05-16 DIAGNOSIS — F600 Paranoid personality disorder: Secondary | ICD-10-CM | POA: Diagnosis not present

## 2022-05-16 NOTE — Progress Notes (Signed)
Inpatient Behavioral Health Placement  Pt meets inpatient criteria per Ophelia Shoulder, NP. There are no beds at [facility] per [facility] Smiley Houseman, RN.   Referral was sent to the following facilities;    Destination Service Provider Address Phone Fax  [facility]  [address] [place] [address] [phone]  [facility]  [address] [place] [address] [phone]  [facility]  [address] [place], [place] [address] [phone]  [facility]  [address] [place], [place] [address] [phone]  [facility]  [address] [place] [address] [phone]  [facility]  [address] [place] [address] [phone]  [facility]  [address], [place] [address] [phone]  [facility]  [address] [place] [address] [phone]  [facility]  [address], [place] [address] [phone]  [facility]  [address], [place] [address] [phone]  [facility]  [address] [place] [address] [phone]  [facility]  [address] [place] [address] [phone]  [facility]  [address] [place] [address] [phone]  [facility]  [address] [place] [address] [phone]  [facility]  [address] [place] [address] [phone]  [facility]  [address] [place] [address] [phone]  [facility]  [address] [place] [address] [phone]   [facility]  [address] [place], [place] [address] [phone]  [facility]  [address], [place] [address] [phone]  [facility]  [address], [place] [address] [phone]  [facility]  [address] [place] [address] [phone]  [facility]  [address], [place] [address] [phone]  [facility]  [address] [place] [address] [phone]  [facility]  [address] [place] [address] [phone]  [facility]  [address] [place] [address] [phone]  [facility]  [address] [place] [address] [phone]  [facility]  [address] [place] [address] [phone]   Situation ongoing,  CSW will follow up.   Maryjean Ka, MSW, LCSWA 05/16/2022  @ 3:33 PM
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
## 2022-05-16 NOTE — Progress Notes (Signed)
Patient has been denied by [facility] due to no appropriate beds available. Patient meets BH inpatient criteria per Sindy Guadeloupe, NP. Patient has been faxed out to the following facilities:   [facility]  [address], [place] [address] [phone]  [facility]  [address] [place] [address] [phone]  [facility]  [address] [place], [place] [address] [phone]  [facility]  [address] [place], [place] [address] [phone]  [facility]  [address] [place] [address] [phone]  [facility]  [address] [place] [address] [phone]  [facility]  [address], [place] [address] [phone]  [facility]  [address] [place] [address] [phone]  [facility]  [address], [place] [address] [phone]  [facility]  [address], [place] [address] [phone]  [facility]  [address] [place] [address] [phone]  [facility]  [address] [place] [address] [phone]  [facility]  [address] [place] [address] [phone]  [facility]  [address] [place] [address] [phone]  [facility]  [address] [place] [address] [phone]  [facility]  [address] [place] [address] [phone]  [facility]  [address], [place] [address] [phone]  [facility]  [address] [place],  [place] [address] [phone]  [facility]  [address], [place] [address] [phone]  [facility]  [address] [place] [address] [phone]  [facility]  [address], [place] [address] [phone]  [facility]  [address], [place] [address] [phone]  [facility]  [address] [place] [address] [phone]  [facility]  [address] [place] [address] [phone]  [facility]  [address] [place] [address] [phone]  [facility]  [address] [place] [address] [phone]  [facility]  [address], [place] [address] [phone]   Damita Dunnings, MSW, LCSW-A  10:48 PM 05/16/2022

## 2022-05-17 DIAGNOSIS — F22 Delusional disorders: Secondary | ICD-10-CM | POA: Diagnosis not present

## 2022-05-17 DIAGNOSIS — I4891 Unspecified atrial fibrillation: Secondary | ICD-10-CM | POA: Diagnosis not present

## 2022-05-17 DIAGNOSIS — F600 Paranoid personality disorder: Secondary | ICD-10-CM | POA: Diagnosis not present

## 2022-05-17 DIAGNOSIS — F200 Paranoid schizophrenia: Secondary | ICD-10-CM | POA: Diagnosis not present

## 2022-05-17 LAB — COMPREHENSIVE METABOLIC PANEL
ALT: 11 U/L (ref 0–44)
AST: 17 U/L (ref 15–41)
Albumin: 3.7 g/dL (ref 3.5–5.0)
Alkaline Phosphatase: 36 U/L — ABNORMAL LOW (ref 38–126)
Anion gap: 7 (ref 5–15)
BUN: 21 mg/dL — ABNORMAL HIGH (ref 6–20)
CO2: 28 mmol/L (ref 22–32)
Calcium: 9.8 mg/dL (ref 8.9–10.3)
Chloride: 100 mmol/L (ref 98–111)
Creatinine, Ser: 0.85 mg/dL (ref 0.44–1.00)
GFR, Estimated: >60 mL/min (ref >60)
Glucose, Bld: 82 mg/dL (ref 70–99)
Potassium: 4.2 mmol/L (ref 3.5–5.1)
Sodium: 135 mmol/L (ref 135–145)
Total Bilirubin: 0.3 mg/dL (ref 0.3–1.2)
Total Protein: 6.6 g/dL (ref 6.5–8.1)

## 2022-05-17 LAB — CBC
HCT: 35.8 % — ABNORMAL LOW (ref 36.0–46.0)
Hemoglobin: 12 g/dL (ref 12.0–15.0)
MCH: 29 pg (ref 26.0–34.0)
MCHC: 33.5 g/dL (ref 30.0–36.0)
MCV: 86.5 fL (ref 80.0–100.0)
Platelets: 56 10*3/uL — ABNORMAL LOW (ref 150–400)
RBC: 4.14 MIL/uL (ref 3.87–5.11)
RDW: 14.6 % (ref 11.5–15.5)
WBC: 2.6 10*3/uL — ABNORMAL LOW (ref 4.0–10.5)
nRBC: 0 % (ref 0.0–0.2)

## 2022-05-17 MED ORDER — LORAZEPAM 2 MG/ML IJ SOLN
1.0000 mg | Freq: Once | INTRAMUSCULAR | Status: DC
  Filled 2022-05-17: qty 1

## 2022-05-17 NOTE — Progress Notes (Signed)
CSW followed-up with Gerilyn Pilgrim with [facility] who advised that this patient is under review for placement. CSW provided treatment team contact number for further review.   Crissie Reese, MSW, Lenice Pressman Phone: [phone] Disposition/TOC

## 2022-05-17 NOTE — ED Notes (Signed)
Sitting up on the side of the bed, gets up to look out of room to shut the door and reopen it up. Repeat multiple times in short spirt's

## 2022-05-17 NOTE — ED Notes (Signed)
Screaming vulgarity at staff, verbalizing threats to harm staff. Call to have medications for calming.

## 2022-05-17 NOTE — ED Notes (Signed)
Patient is asleep in bed

## 2022-05-17 NOTE — ED Notes (Addendum)
Patient is taking a shower. Bed linens were changed.

## 2022-05-17 NOTE — ED Notes (Signed)
Patient is asleep.  

## 2022-05-17 NOTE — ED Notes (Signed)
Patient is back in room. Eating snacks.

## 2022-05-17 NOTE — ED Notes (Signed)
Kathy Brown begun screaming and yelling that she isn't drinking and she will not eat. Multiple attempts to run out of the ER. Pt brought back in by her sitter.

## 2022-05-17 NOTE — ED Notes (Signed)
Patient is awake and pleasant and took medication without any complications.

## 2022-05-17 NOTE — ED Notes (Signed)
Patient stated she needed some medication "so I wont have to curse anybody out and stop shaking." Medication was given and patient was encouraged to get some rest. Patient redirectable at this time. She is currently laying in bed.

## 2022-05-17 NOTE — ED Provider Notes (Signed)
Emergency Medicine Observation Re-evaluation Note  Kathy Brown is a 41 y.o. female, seen on rounds today.  Pt initially presented to the ED for complaints of psychiatric eval. Pt with acute on chronic psychosis, back on meds. Somewhat less paranoid thoughts/delusions than initially. No new c/o this AM.   Physical Exam  BP 95/61 (BP Location: Right Arm)   Pulse 75   Temp 97.9 F (36.6 C) (Oral)   Resp 16   SpO2 96%  Physical Exam General: resting, easily aroused.  Cardiac: reg rate Lungs: breathing comfortably Psych: calm, alert. Not currently/actively responding to internal stimuli.   ED Course / MDM    I have reviewed the labs performed to date as well as medications administered while in observation.  Recent changes in the last 24 hours include ED obs, med management, BH eval and placement.   Plan  [facility]spo per [facility] team.        Cathren Laine, MD 05/17/22 [phone]

## 2022-05-18 DIAGNOSIS — F22 Delusional disorders: Secondary | ICD-10-CM | POA: Diagnosis not present

## 2022-05-18 DIAGNOSIS — F600 Paranoid personality disorder: Secondary | ICD-10-CM | POA: Diagnosis not present

## 2022-05-18 DIAGNOSIS — F200 Paranoid schizophrenia: Secondary | ICD-10-CM | POA: Diagnosis not present

## 2022-05-18 NOTE — ED Notes (Signed)
Patient up to the bathroom. Patient is now back in room.

## 2022-05-18 NOTE — ED Notes (Signed)
Patient reports to RN that she would like something for her abdominal pain and heart burn.

## 2022-05-18 NOTE — ED Notes (Signed)
Patient continues to get out of bed and shut door.

## 2022-05-18 NOTE — ED Notes (Signed)
Patient is asleep.  

## 2022-05-18 NOTE — ED Notes (Signed)
Patient noted to be yelling at things not seen. RN approached patient and asked if she was seeing things that were not there, the patient reported that she was. RN asked patient if she would take medication to help and patient states yes.

## 2022-05-18 NOTE — ED Notes (Signed)
Patient is laying in bed watching TV 

## 2022-05-18 NOTE — ED Provider Notes (Signed)
Emergency Medicine Observation Re-evaluation Note  Caddie Randle is a 41 y.o. female, seen on rounds today.  Pt initially presented to the ED for complaints of schizoaffective disorder, with acute psychosis. Pt currently is calm. Paranoid thoughts/behaviors appear somewhat improved from prior.   Physical Exam  BP (!) 93/56 (BP Location: Right Arm)   Pulse 68   Temp 97.9 F (36.6 C) (Oral)   Resp 17   SpO2 99%  Physical Exam General: calm, alert.  Cardiac: regular rate Lungs: breathing comfortably Psych: normal mood and affect. Intermittent does appear to be responding to internal stimuli.   ED Course / MDM    I have reviewed the labs performed to date as well as medications administered while in observation.  Recent changes in the last 24 hours include ED obs, med management, reassessment.   Plan  BH team is managing/adjusting patients BH meds, and inpatient psych placement per [facility] team.   As pt slowly trends in positive direction, BH team will need to reassess when patient is at or near baseline in terms of transition back to outpatient environment with close bp f/u (if they are not able to secure inpatient placement at [facility] or elsewhere).        Cathren Laine, MD 05/18/22 1214

## 2022-05-18 NOTE — ED Notes (Signed)
Patient up to the bathroom. Sitter assisting patient.

## 2022-05-19 DIAGNOSIS — F600 Paranoid personality disorder: Secondary | ICD-10-CM | POA: Diagnosis not present

## 2022-05-19 DIAGNOSIS — F200 Paranoid schizophrenia: Secondary | ICD-10-CM | POA: Diagnosis not present

## 2022-05-19 DIAGNOSIS — F22 Delusional disorders: Secondary | ICD-10-CM | POA: Diagnosis not present

## 2022-05-19 MED ORDER — LORAZEPAM 2 MG/ML IJ SOLN
2.0000 mg | Freq: Once | INTRAMUSCULAR | Status: AC
  Administered 2022-05-19: 2 mg via INTRAMUSCULAR
  Filled 2022-05-19: qty 1

## 2022-05-19 MED ORDER — DIPHENHYDRAMINE HCL 50 MG/ML IJ SOLN: 25.0000 mg | Freq: Once | INTRAMUSCULAR | Status: DC

## 2022-05-19 MED ORDER — LORAZEPAM 1 MG PO TABS
1.0000 mg | ORAL_TABLET | Freq: Once | ORAL | Status: AC
  Administered 2022-05-19: 1 mg via ORAL
  Filled 2022-05-19: qty 1

## 2022-05-19 MED ORDER — QUETIAPINE FUMARATE 50 MG PO TABS
50.0000 mg | ORAL_TABLET | Freq: Every day | ORAL | Status: DC
  Administered 2022-05-19: 50 mg via ORAL
  Filled 2022-05-19: qty 1

## 2022-05-19 MED ORDER — ZIPRASIDONE MESYLATE 20 MG IM SOLR: 20.0000 mg | Freq: Once | INTRAMUSCULAR | Status: DC

## 2022-05-19 NOTE — Progress Notes (Signed)
Pt begin to slam door I asked her was she ok, or did she need somethimg she said she was fine. When I walked away she slammed the door again then proceeded to try and flip the mattress off the bed. I informed her that she could not do that . She said she didn't want the bed so I offered her a chair but she didn't want it. She began to yell and scream and continued to slam the door. The nurse and security approached and reiterated that she could not keep slamming the door and scream. She continued to scream and then begin to cry uncontrollably saying she had rights she was in [place] and she was going to tell Whammy

## 2022-05-19 NOTE — Progress Notes (Signed)
Inpatient Behavioral Health Placement  Meets inpatient criteria per Eligha Bridegroom, NP. There are [facility] beds [facility] per [facility] Malva Limes, RN Referral was sent to the following facilities;    Destination Service Provider Address Phone Fax  [facility]  [address], [place] [address] [phone]  [facility]  [address] [place] [address] [phone]  [facility]  [address] [place], [place] [address] [phone]  [facility]  [address] [place], [place] [address] [phone]  [facility]  [address] [place] [address] [phone]  [facility]  [address] [place] [address] [phone]  [facility]  [address] [place] [address] [phone]  [facility]  [address] [place] [address] [phone]  [facility]  [address] [place] [address] [phone]  [facility]  [address] [place] [address] [phone]  [facility]  [address] [place] [address] [phone]  [facility]  [address] [place] [address] [phone]  [facility]  [address] [place] [address] [phone]  [facility]  [address] [place] [address] [phone]  [facility]  [address] [place] [address] [phone]  [facility]  [address] [place] [address] [phone]  [facility]  [address] [place] [address] [phone]  [facility]  [address] [place], [place] [address] [phone]  [facility]  [address], [place] [address] [phone]  [facility]  [address] [place] [address] [phone]  [facility]  [address] [place] [address] [phone]  [facility]  [address], [place] [address] [phone]  [facility]  [address] [place] [address] [phone]  [facility]  [address], [place] [address] [phone]  [facility]  [address], [place] [address] [phone]  [facility]  [address] [place] [address] [phone]  [facility]  [address] [place] [address] [phone]   Situation ongoing,  CSW will follow up.   Maryjean Ka, MSW, LCSWA 05/19/2022  @ 6:30 PM

## 2022-05-19 NOTE — Consult Note (Signed)
  Attempted to reevaluate patient face-to-face in her room at [facility] ED.  She refused to speak with a provider.  Patient was able to shake her head indicating yes or no with some questions asked her.  Patient shook her head indicating no when asked if she had any thoughts of wanting to harm herself or other people.  She shook her head indicating yes when asked if she was having auditory hallucinations.  She would not further verbally elaborate on her auditory hallucinations.  She shook her head indicating no when asked if she was having visual hallucinations.  Patient has had selective mutism since being in the emergency department.  There are times when she will verbally speak with you in time she will not.  She has been more consistent with being compliant with medications.  Patient's mother visited her today and she reported to [place] the RN that she is still not at her baseline and is acting bizarre.  At this time will continue to recommend inpatient psychiatric treatment.  I have reached out to [facility] in hopes to have patient prioritized for thought disorder unit.  CSW notified and patient has been faxed out to other facilities.  EDP, RN, and LCSW notified of disposition.
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
## 2022-05-19 NOTE — ED Notes (Signed)
Pt noted to be pacing in her room, "fighting" with someone or something. Pt slamming door and hitting the wall. MD notified and orders placed.

## 2022-05-19 NOTE — ED Notes (Signed)
Breakfast order placed ?

## 2022-05-20 DIAGNOSIS — F600 Paranoid personality disorder: Secondary | ICD-10-CM | POA: Diagnosis not present

## 2022-05-20 DIAGNOSIS — F200 Paranoid schizophrenia: Secondary | ICD-10-CM | POA: Diagnosis not present

## 2022-05-20 DIAGNOSIS — F22 Delusional disorders: Secondary | ICD-10-CM | POA: Diagnosis not present

## 2022-05-20 MED ORDER — QUETIAPINE FUMARATE 100 MG PO TABS
100.0000 mg | ORAL_TABLET | Freq: Every day | ORAL | Status: DC
  Administered 2022-05-20: 100 mg via ORAL
  Filled 2022-05-20: qty 1

## 2022-05-20 NOTE — Progress Notes (Signed)
CSW sending referral to [facility] and will follow up.   Maryjean Ka, MSW, [facility] 05/20/2022 10:35 AM
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
## 2022-05-20 NOTE — Progress Notes (Signed)
Pt was accepted to [facility]; [facility]  Pt meets inpatient criteria per Eligha Bridegroom, NP  Attending Physician will be Dr. Jola Babinski  Report can be called to: [phone]  Pt can arrive after 9:00am  Care Team: Eligha Bridegroom, NP, Nelly Rout, MD, and Marrion Coy, RN.  Kelton Pillar, LCSWA 05/20/2022 @ 1:13 PM
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
## 2022-05-20 NOTE — ED Notes (Signed)
Breakfast order placed ?

## 2022-05-20 NOTE — Progress Notes (Signed)
Inpatient Behavioral Health Placement   Meets inpatient criteria per Eligha Bridegroom, NP. There are no available beds at [facility] per [facility] Malva Limes, RN Referral was sent to the following facilities;      Destination Service Provider Address Phone Fax  [facility]  [address], [place] [address] [phone]  [facility]  [address] [place] [address] [phone]  [facility]  [address] [place], [place] [address] [phone]  [facility]  [address] [place], [place] [address] [phone]  [facility]  [address] [place] [address] [phone]  [facility]  [address] [place] [address] [phone]  [facility]  [address], [place] [address] [phone]  [facility]  [address] [place] [address] [phone]  [facility]  [address], [place] [address] [phone]  [facility]  [address] [place] [address] [phone]  [facility]  [address] [place] [address] [phone]  [facility]  [address] [place] [address] [phone]  [facility]  [address] [place] [address] [phone]  [facility]  [address] [place] [address] [phone]  [facility]  [address], [place] [address] [phone]  [facility]  [address] [place] [address] [phone]  [facility]  [address] [place] [address] [phone]  [facility]  [address] [place], [place] [address] [phone]  [facility]  [address] [place] [address] [phone]  [facility]  [address] [place] [address] [phone]  [facility]  [address], [place] [address] [phone]  [facility]  [address] [place] [address] [phone]  [facility]  [address] [place] [address] [phone]  [facility]  [address], [place] [address] [phone]  [facility]  [address] [place] [address] [phone]  [facility]  [address] [place] [address] [phone]  [facility]  [address] [place] [address] [phone]    Situation ongoing,  CSW will follow up.   Maryjean Ka, MSW, [facility] 05/20/2022 10:36 AM
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
## 2022-05-21 DIAGNOSIS — K5900 Constipation, unspecified: Secondary | ICD-10-CM | POA: Diagnosis not present

## 2022-05-21 DIAGNOSIS — I4891 Unspecified atrial fibrillation: Secondary | ICD-10-CM | POA: Diagnosis not present

## 2022-05-21 DIAGNOSIS — Z79899 Other long term (current) drug therapy: Secondary | ICD-10-CM | POA: Diagnosis not present

## 2022-05-21 DIAGNOSIS — Z91148 Patient's other noncompliance with medication regimen for other reason: Secondary | ICD-10-CM | POA: Diagnosis not present

## 2022-05-21 DIAGNOSIS — F209 Schizophrenia, unspecified: Secondary | ICD-10-CM | POA: Diagnosis not present

## 2022-05-21 DIAGNOSIS — U071 COVID-19: Secondary | ICD-10-CM | POA: Diagnosis not present

## 2022-05-21 DIAGNOSIS — Z1339 Encounter for screening examination for other mental health and behavioral disorders: Secondary | ICD-10-CM | POA: Diagnosis not present

## 2022-05-21 DIAGNOSIS — I10 Essential (primary) hypertension: Secondary | ICD-10-CM | POA: Diagnosis not present

## 2022-05-21 DIAGNOSIS — F419 Anxiety disorder, unspecified: Secondary | ICD-10-CM | POA: Diagnosis not present

## 2022-05-21 DIAGNOSIS — R3981 Functional urinary incontinence: Secondary | ICD-10-CM | POA: Diagnosis not present

## 2022-05-21 DIAGNOSIS — G259 Extrapyramidal and movement disorder, unspecified: Secondary | ICD-10-CM | POA: Diagnosis not present

## 2022-05-21 DIAGNOSIS — Z7982 Long term (current) use of aspirin: Secondary | ICD-10-CM | POA: Diagnosis not present

## 2022-05-21 DIAGNOSIS — F200 Paranoid schizophrenia: Secondary | ICD-10-CM | POA: Diagnosis not present

## 2022-05-21 NOTE — ED Notes (Signed)
Report given to Jeannene Patella, RN at [facility]. Pt going to [facility] main [facility]

## 2022-05-21 NOTE — ED Notes (Signed)
Breakfast order placed ?

## 2022-05-21 NOTE — ED Notes (Signed)
IVC paperwork review: IVC exp. 05/27/2022; patient being transferred to [facility]

## 2022-05-28 ENCOUNTER — Ambulatory Visit: Payer: Medicare Other | Admitting: Cardiovascular Disease

## 2022-07-04 ENCOUNTER — Emergency Department (HOSPITAL_COMMUNITY)
Admission: EM | Admit: 2022-07-04 | Discharge: 2022-07-05 | Disposition: A | Discharge status: Other Acute Inpatient Hospital | Payer: Medicare Other | Attending: Emergency Medicine | Admitting: Emergency Medicine

## 2022-07-04 ENCOUNTER — Encounter (HOSPITAL_COMMUNITY): Payer: Self-pay | Admitting: Emergency Medicine

## 2022-07-04 DIAGNOSIS — Z20822 Contact with and (suspected) exposure to covid-19: Secondary | ICD-10-CM | POA: Diagnosis not present

## 2022-07-04 DIAGNOSIS — Z7982 Long term (current) use of aspirin: Secondary | ICD-10-CM | POA: Insufficient documentation

## 2022-07-04 DIAGNOSIS — I10 Essential (primary) hypertension: Secondary | ICD-10-CM | POA: Diagnosis not present

## 2022-07-04 DIAGNOSIS — R440 Auditory hallucinations: Secondary | ICD-10-CM | POA: Diagnosis present

## 2022-07-04 DIAGNOSIS — N9489 Other specified conditions associated with female genital organs and menstrual cycle: Secondary | ICD-10-CM | POA: Diagnosis not present

## 2022-07-04 DIAGNOSIS — F259 Schizoaffective disorder, unspecified: Secondary | ICD-10-CM | POA: Diagnosis not present

## 2022-07-04 DIAGNOSIS — R443 Hallucinations, unspecified: Secondary | ICD-10-CM | POA: Diagnosis not present

## 2022-07-04 LAB — CBC WITH DIFFERENTIAL/PLATELET
Abs Immature Granulocytes: 0.01 10*3/uL (ref 0.00–0.07)
Basophils Absolute: 0.1 10*3/uL (ref 0.0–0.1)
Basophils Relative: 1 %
Eosinophils Absolute: 0.1 10*3/uL (ref 0.0–0.5)
Eosinophils Relative: 2 %
HCT: 33.8 % — ABNORMAL LOW (ref 36.0–46.0)
Hemoglobin: 11 g/dL — ABNORMAL LOW (ref 12.0–15.0)
Immature Granulocytes: 0 %
Lymphocytes Relative: 42 %
Lymphs Abs: 1.6 10*3/uL (ref 0.7–4.0)
MCH: 29.5 pg (ref 26.0–34.0)
MCHC: 32.5 g/dL (ref 30.0–36.0)
MCV: 90.6 fL (ref 80.0–100.0)
Monocytes Absolute: 0.3 10*3/uL (ref 0.1–1.0)
Monocytes Relative: 7 %
Neutro Abs: 1.8 10*3/uL (ref 1.7–7.7)
Neutrophils Relative %: 48 %
Platelets: 261 10*3/uL (ref 150–400)
RBC: 3.73 MIL/uL — ABNORMAL LOW (ref 3.87–5.11)
RDW: 14.9 % (ref 11.5–15.5)
WBC: 3.8 10*3/uL — ABNORMAL LOW (ref 4.0–10.5)
nRBC: 0 % (ref 0.0–0.2)

## 2022-07-04 LAB — RAPID URINE DRUG SCREEN, HOSP PERFORMED
Amphetamines: NOT DETECTED
Barbiturates: NOT DETECTED
Benzodiazepines: NOT DETECTED
Cocaine: NOT DETECTED
Opiates: NOT DETECTED
Tetrahydrocannabinol: NOT DETECTED

## 2022-07-04 LAB — URINALYSIS, ROUTINE W REFLEX MICROSCOPIC
Bilirubin Urine: NEGATIVE
Glucose, UA: NEGATIVE mg/dL
Hgb urine dipstick: NEGATIVE
Ketones, ur: NEGATIVE mg/dL
Leukocytes,Ua: NEGATIVE
Nitrite: NEGATIVE
Protein, ur: NEGATIVE mg/dL
Specific Gravity, Urine: 1.005 (ref 1.005–1.030)
pH: 7 (ref 5.0–8.0)

## 2022-07-04 LAB — COMPREHENSIVE METABOLIC PANEL
ALT: 10 U/L (ref 0–44)
AST: 15 U/L (ref 15–41)
Albumin: 3.3 g/dL — ABNORMAL LOW (ref 3.5–5.0)
Alkaline Phosphatase: 41 U/L (ref 38–126)
Anion gap: 8 (ref 5–15)
BUN: 11 mg/dL (ref 6–20)
CO2: 29 mmol/L (ref 22–32)
Calcium: 9.2 mg/dL (ref 8.9–10.3)
Chloride: 103 mmol/L (ref 98–111)
Creatinine, Ser: 0.6 mg/dL (ref 0.44–1.00)
GFR, Estimated: >60 mL/min (ref >60)
Glucose, Bld: 103 mg/dL — ABNORMAL HIGH (ref 70–99)
Potassium: 3.8 mmol/L (ref 3.5–5.1)
Sodium: 140 mmol/L (ref 135–145)
Total Bilirubin: 0.3 mg/dL (ref 0.3–1.2)
Total Protein: 6.6 g/dL (ref 6.5–8.1)

## 2022-07-04 LAB — PREGNANCY, URINE: Preg Test, Ur: NEGATIVE

## 2022-07-04 LAB — RESP PANEL BY RT-PCR (FLU A&B, COVID) ARPGX2
Influenza A by PCR: NEGATIVE
Influenza B by PCR: NEGATIVE
SARS Coronavirus 2 by RT PCR: NEGATIVE

## 2022-07-04 LAB — I-STAT BETA HCG BLOOD, ED (MC, WL, AP ONLY): I-stat hCG, quantitative: <5 m[IU]/mL (ref <5)

## 2022-07-04 LAB — ETHANOL: Alcohol, Ethyl (B): <10 mg/dL (ref <10)

## 2022-07-04 MED ORDER — HALOPERIDOL 5 MG PO TABS
5.0000 mg | ORAL_TABLET | Freq: Three times a day (TID) | ORAL | Status: DC
  Administered 2022-07-04: 5 mg via ORAL
  Filled 2022-07-04: qty 1

## 2022-07-04 NOTE — ED Triage Notes (Signed)
Patient states she is here with complaint of having a nightmare this morning and has no appetite. Patient is alert and in no apparent distress at this time.

## 2022-07-04 NOTE — ED Provider Triage Note (Signed)
Emergency Medicine Provider Triage Evaluation Note  Kathy Brown , a 41 y.o. female  was evaluated in triage.  Pt complains of nightmare, mouth pain.  Patient states that she has had recurrent nightmares for the past 1 to 3 weeks.  She also reports mouth pain that hurts with chewing.  Denies difficulty swallowing, sore throat or difficulty breathing.  Denies fever, chills, night sweats.  Denies any recent medical changes.  Denies suicidal/homicidal ideation or auditory visual hallucinations..  Review of Systems  Positive: See above Negative:   Physical Exam  BP 134/77 (BP Location: Right Arm)   Pulse 89   Temp 98.5 F (36.9 C) (Oral)   Resp 16   SpO2 99%  Gen:   Awake, no distress   Resp:  Normal effort  MSK:   Moves extremities without difficulty  Other:  Lungs clear to auscultation.  No obvious murmurs gallops or rubs.  Regular rate and rhythm.  No abdominal tenderness on exam.  Medical Decision Making  Medically screening exam initiated at 4:40 PM.  Appropriate orders placed.  Kathy Brown was informed that the remainder of the evaluation will be completed by another provider, this initial triage assessment does not replace that evaluation, and the importance of remaining in the ED until their evaluation is complete.     Peter Garter, [place] 07/04/22 [phone]

## 2022-07-04 NOTE — Progress Notes (Signed)
Patient has been denied by [facility] due to no appropriate beds available. Patient meets BH inpatient criteria per Roselyn Bering, NP. Patient has been faxed out to the following facilities:   [facility]  [address] [place] [address] [phone]  [facility]  [address] [place]., [place] [address] [phone]  [facility]  [address] [place] [address] [phone]  [facility]  [address] [place] [address] [phone]  [facility]  [address] [place] [address] [phone]  [facility]  [address] [place] [address] [phone]  [facility]  [address] [place] [address] [phone]  [facility]  [address] [place], [place] [address] [phone]  [facility]  [address] [place], [place] [address] [phone]  [facility]  [phone] [address] [place] [address] [phone]  [facility]  [address] [place] [address] [phone]  [facility]  [address] [place] [address] [phone]  [facility]  [address], [place] [address] [phone]  [facility]  [address], [place] [address] [phone]   Damita Dunnings, MSW, LCSW-A  11:39 PM 07/04/2022
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
## 2022-07-04 NOTE — ED Notes (Signed)
Pt called 2x no answer 

## 2022-07-04 NOTE — ED Provider Notes (Addendum)
[facility] EMERGENCY DEPARTMENT Provider Note   CSN: [id number] Arrival date & time: 07/04/22  1554     History  Chief Complaint  Patient presents with   Nightmares    Kathy Brown is a 41 y.o. female.  Patient is a 41 year old female with past medical history of schizoaffective disorder and previous hospitalizations for psychosis presenting with mom for delusions, paranoia, auditory and visual hallucinations, stating the patient is talking to herself in the room, and nightmares.  Recently admitted to [facility] and discharged on 7 days ago on 06/27/2022.  She was previously taking melatonin, Benadryl, and trazodone.  She instructed to stop those medications at that time and was started on Haldol 5 mg tablets 3 times a day.  Mother states the patient has not been adjusting well to the new medication and symptoms have not improved at all.  The history is provided by the patient. No language interpreter was used.       Home Medications Prior to Admission medications   Medication Sig Start Date End Date Taking? Authorizing Provider  digoxin (LANOXIN) 0.25 MG tablet Take 1 tablet (0.25 mg total) by mouth daily. 01/31/22  Yes Rolly Salter, MD  haloperidol (HALDOL) 5 MG tablet Take 5 mg by mouth 3 (three) times daily.   Yes [provider]  aspirin EC 81 MG EC tablet Take 1 tablet (81 mg total) by mouth daily. Swallow whole. 01/31/22   Rolly Salter, MD  atorvastatin (LIPITOR) 40 MG tablet Take 1 tablet (40 mg total) by mouth daily. 01/31/22   Rolly Salter, MD  benztropine (COGENTIN) 0.5 MG tablet Take 1 tablet (0.5 mg total) by mouth 2 (two) times daily as needed for tremors (Extraparametal side effects). 11/22/21   Estella Husk, MD  hydrOXYzine (ATARAX) 25 MG tablet Take 1 tablet (25 mg total) by mouth 3 (three) times daily as needed for anxiety. 11/22/21   Estella Husk, MD  metoprolol succinate (TOPROL-XL) 25 MG 24 hr tablet Take 1 tablet (25  mg total) by mouth daily. 01/31/22   Rolly Salter, MD  polyethylene glycol (MIRALAX / GLYCOLAX) 17 g packet Take 17 g by mouth daily. Patient taking differently: Take 17 g by mouth daily as needed for mild constipation. 01/31/22   Rolly Salter, MD  risperiDONE (RISPERDAL) 1 MG tablet Take 1 tablet (1 mg total) by mouth 2 (two) times daily. 01/31/22 05/09/22  Rolly Salter, MD  traZODone (DESYREL) 50 MG tablet Take 1 tablet (50 mg total) by mouth at bedtime as needed for sleep. 01/31/22   Rolly Salter, MD      Allergies    Patient has no known allergies.    Review of Systems   Review of Systems  Constitutional:  Negative for chills and fever.  HENT:  Negative for ear pain and sore throat.   Eyes:  Negative for pain and visual disturbance.  Respiratory:  Negative for cough and shortness of breath.   Cardiovascular:  Negative for chest pain and palpitations.  Gastrointestinal:  Negative for abdominal pain and vomiting.  Genitourinary:  Negative for dysuria and hematuria.  Musculoskeletal:  Negative for arthralgias and back pain.  Skin:  Negative for color change and rash.  Neurological:  Negative for seizures and syncope.  Psychiatric/Behavioral:  Positive for hallucinations and sleep disturbance.   All other systems reviewed and are negative.   Physical Exam Updated Vital Signs BP 135/75 (BP Location: Right Arm)  Pulse 75   Temp 98.2 F (36.8 C) (Oral)   Resp 16   SpO2 97%  Physical Exam Vitals and nursing note reviewed.  Constitutional:      General: She is not in acute distress.    Appearance: She is well-developed.  HENT:     Head: Normocephalic and atraumatic.     Mouth/Throat:     Comments: No teeth. No swelling of gums, erythema, wounds, or fluctuance.  Eyes:     Conjunctiva/sclera: Conjunctivae normal.  Cardiovascular:     Rate and Rhythm: Normal rate and regular rhythm.     Heart sounds: No murmur heard. Pulmonary:     Effort: Pulmonary effort is normal. No  respiratory distress.     Breath sounds: Normal breath sounds.  Abdominal:     Palpations: Abdomen is soft.     Tenderness: There is no abdominal tenderness.  Musculoskeletal:        General: No swelling.     Cervical back: Neck supple.  Skin:    General: Skin is warm and dry.     Capillary Refill: Capillary refill takes less than 2 seconds.  Neurological:     Mental Status: She is alert.  Psychiatric:        Attention and Perception: She perceives auditory and visual hallucinations.        Mood and Affect: Mood normal. Affect is blunt.        Speech: She is noncommunicative.        Behavior: Behavior is slowed.     ED Results / Procedures / Treatments   Labs (all labs ordered are listed, but only abnormal results are displayed) Labs Reviewed  COMPREHENSIVE METABOLIC PANEL - Abnormal; Notable for the following components:      Result Value   Glucose, Bld 103 (*)    Albumin 3.3 (*)    All other components within normal limits  CBC WITH DIFFERENTIAL/PLATELET - Abnormal; Notable for the following components:   WBC 3.8 (*)    RBC 3.73 (*)    Hemoglobin 11.0 (*)    HCT 33.8 (*)    All other components within normal limits  URINALYSIS, ROUTINE W REFLEX MICROSCOPIC - Abnormal; Notable for the following components:   Color, Urine STRAW (*)    All other components within normal limits  RESP PANEL BY RT-PCR (FLU A&B, COVID) [id number]  PREGNANCY, URINE  RAPID URINE DRUG SCREEN, HOSP PERFORMED  ETHANOL  I-STAT BETA HCG BLOOD, ED ([facility], [facility], [facility] ONLY)    EKG None  Radiology No results found.  Procedures Procedures    Medications Ordered in ED Medications - No data to display  ED Course/ Medical Decision Making/ A&P                           Medical Decision Making Amount and/or Complexity of Data Reviewed Labs: ordered.   35:1 PM 41 year old female with past medical history of schizoaffective disorder and previous hospitalizations for psychosis presenting with mom for  delusions, paranoia, auditory and visual hallucinations, stating the patient is talking to herself in the room, and nightmares.  Physical exam patient is quiet, does not respond verbally but shakes her head yes or no, not currently responding to internal stimuli.  Pt reported mouth pain in triage. Points to front bottom gums. No teeth on exam. No swelling, erythema, wounds, or abscesses. Mom states patient "probably just ate a hard chip or something". No further intervention at  this time.   Long standing history of treatment for schizoaffective disorder.  Medically cleared at this time and safe to be evaluated by [facility] physician.  [facility] recommendations "TTS completed. Quintella Reichert, NP recommends inpatient psychiatric treatment. Appropriate facilities will be contacted for placement". Will order home meds while pending placement.     Final Clinical Impression(s) / ED Diagnoses Final diagnoses:  Schizoaffective disorder, unspecified type [facility])  Hallucinations    Rx / DC Orders ED Discharge Orders     None         Lianne Cure, DO [id number]    Campbell Stall P, DO [id number]    Campbell Stall P, DO [id number]

## 2022-07-05 DIAGNOSIS — Z20822 Contact with and (suspected) exposure to covid-19: Secondary | ICD-10-CM | POA: Diagnosis not present

## 2022-07-05 DIAGNOSIS — Z818 Family history of other mental and behavioral disorders: Secondary | ICD-10-CM | POA: Diagnosis not present

## 2022-07-05 DIAGNOSIS — F79 Unspecified intellectual disabilities: Secondary | ICD-10-CM | POA: Diagnosis not present

## 2022-07-05 DIAGNOSIS — I10 Essential (primary) hypertension: Secondary | ICD-10-CM | POA: Diagnosis not present

## 2022-07-05 DIAGNOSIS — F200 Paranoid schizophrenia: Secondary | ICD-10-CM | POA: Diagnosis not present

## 2022-07-05 DIAGNOSIS — E7800 Pure hypercholesterolemia, unspecified: Secondary | ICD-10-CM | POA: Diagnosis not present

## 2022-07-05 DIAGNOSIS — F29 Unspecified psychosis not due to a substance or known physiological condition: Secondary | ICD-10-CM | POA: Diagnosis not present

## 2022-07-05 DIAGNOSIS — R451 Restlessness and agitation: Secondary | ICD-10-CM | POA: Diagnosis not present

## 2022-07-05 NOTE — ED Notes (Signed)
Pt meets placement at [facility]. Accepting MD [place].

## 2022-07-05 NOTE — ED Notes (Signed)
This RN walked pt out to safe transport. Report given to [facility].

## 2022-07-05 NOTE — ED Notes (Signed)
Pt dressed out into purple scrubs, mother took pt's belongings.

## 2022-07-05 NOTE — ED Notes (Signed)
Patient verbalizes understanding of transfer to [facility]. Opportunities for questions and answers were provided. Pt d/c from ED and transferred via [employer].

## 2022-07-05 NOTE — ED Notes (Signed)
This RN called pt's mother to update about pt's transfer, no answer.

## 2022-07-06 DIAGNOSIS — F29 Unspecified psychosis not due to a substance or known physiological condition: Secondary | ICD-10-CM | POA: Diagnosis not present

## 2022-07-07 DIAGNOSIS — F29 Unspecified psychosis not due to a substance or known physiological condition: Secondary | ICD-10-CM | POA: Diagnosis not present

## 2022-07-08 DIAGNOSIS — F29 Unspecified psychosis not due to a substance or known physiological condition: Secondary | ICD-10-CM | POA: Diagnosis not present

## 2022-07-09 DIAGNOSIS — F29 Unspecified psychosis not due to a substance or known physiological condition: Secondary | ICD-10-CM | POA: Diagnosis not present

## 2022-07-10 DIAGNOSIS — F29 Unspecified psychosis not due to a substance or known physiological condition: Secondary | ICD-10-CM | POA: Diagnosis not present

## 2022-07-11 DIAGNOSIS — F29 Unspecified psychosis not due to a substance or known physiological condition: Secondary | ICD-10-CM | POA: Diagnosis not present

## 2022-07-12 DIAGNOSIS — F29 Unspecified psychosis not due to a substance or known physiological condition: Secondary | ICD-10-CM | POA: Diagnosis not present

## 2022-07-13 DIAGNOSIS — F29 Unspecified psychosis not due to a substance or known physiological condition: Secondary | ICD-10-CM | POA: Diagnosis not present

## 2022-07-14 DIAGNOSIS — F29 Unspecified psychosis not due to a substance or known physiological condition: Secondary | ICD-10-CM | POA: Diagnosis not present

## 2022-07-15 DIAGNOSIS — F29 Unspecified psychosis not due to a substance or known physiological condition: Secondary | ICD-10-CM | POA: Diagnosis not present

## 2022-07-16 DIAGNOSIS — F29 Unspecified psychosis not due to a substance or known physiological condition: Secondary | ICD-10-CM | POA: Diagnosis not present

## 2022-07-17 DIAGNOSIS — F29 Unspecified psychosis not due to a substance or known physiological condition: Secondary | ICD-10-CM | POA: Diagnosis not present

## 2022-07-19 DIAGNOSIS — F29 Unspecified psychosis not due to a substance or known physiological condition: Secondary | ICD-10-CM | POA: Diagnosis not present

## 2022-07-20 DIAGNOSIS — F29 Unspecified psychosis not due to a substance or known physiological condition: Secondary | ICD-10-CM | POA: Diagnosis not present

## 2022-07-21 DIAGNOSIS — F29 Unspecified psychosis not due to a substance or known physiological condition: Secondary | ICD-10-CM | POA: Diagnosis not present

## 2022-07-22 DIAGNOSIS — F29 Unspecified psychosis not due to a substance or known physiological condition: Secondary | ICD-10-CM | POA: Diagnosis not present

## 2022-07-23 DIAGNOSIS — F29 Unspecified psychosis not due to a substance or known physiological condition: Secondary | ICD-10-CM | POA: Diagnosis not present

## 2022-07-24 DIAGNOSIS — F29 Unspecified psychosis not due to a substance or known physiological condition: Secondary | ICD-10-CM | POA: Diagnosis not present

## 2022-07-25 DIAGNOSIS — F29 Unspecified psychosis not due to a substance or known physiological condition: Secondary | ICD-10-CM | POA: Diagnosis not present

## 2022-08-14 DIAGNOSIS — B353 Tinea pedis: Secondary | ICD-10-CM | POA: Diagnosis not present

## 2022-09-12 DIAGNOSIS — R32 Unspecified urinary incontinence: Secondary | ICD-10-CM | POA: Diagnosis not present

## 2022-09-12 DIAGNOSIS — Z23 Encounter for immunization: Secondary | ICD-10-CM | POA: Diagnosis not present

## 2022-09-12 DIAGNOSIS — Z0000 Encounter for general adult medical examination without abnormal findings: Secondary | ICD-10-CM | POA: Diagnosis not present

## 2022-09-12 DIAGNOSIS — Z79899 Other long term (current) drug therapy: Secondary | ICD-10-CM | POA: Diagnosis not present

## 2022-09-12 DIAGNOSIS — Z01419 Encounter for gynecological examination (general) (routine) without abnormal findings: Secondary | ICD-10-CM | POA: Diagnosis not present

## 2022-10-24 ENCOUNTER — Emergency Department (HOSPITAL_COMMUNITY)
Admission: EM | Admit: 2022-10-24 | Discharge: 2022-10-24 | Disposition: A | Discharge status: Home / Self Care | Payer: Medicare Other | Attending: Emergency Medicine | Admitting: Emergency Medicine

## 2022-10-24 DIAGNOSIS — B9789 Other viral agents as the cause of diseases classified elsewhere: Secondary | ICD-10-CM | POA: Diagnosis not present

## 2022-10-24 DIAGNOSIS — I4891 Unspecified atrial fibrillation: Secondary | ICD-10-CM | POA: Diagnosis not present

## 2022-10-24 DIAGNOSIS — Z79899 Other long term (current) drug therapy: Secondary | ICD-10-CM | POA: Insufficient documentation

## 2022-10-24 DIAGNOSIS — J069 Acute upper respiratory infection, unspecified: Secondary | ICD-10-CM | POA: Diagnosis not present

## 2022-10-24 DIAGNOSIS — R059 Cough, unspecified: Secondary | ICD-10-CM | POA: Diagnosis present

## 2022-10-24 DIAGNOSIS — Z7982 Long term (current) use of aspirin: Secondary | ICD-10-CM | POA: Insufficient documentation

## 2022-10-24 DIAGNOSIS — Z1152 Encounter for screening for COVID-19: Secondary | ICD-10-CM | POA: Diagnosis not present

## 2022-10-24 LAB — RESP PANEL BY RT-PCR (RSV, FLU A&B, COVID)  RVPGX2
Influenza A by PCR: NEGATIVE
Influenza B by PCR: NEGATIVE
Resp Syncytial Virus by PCR: NEGATIVE
SARS Coronavirus 2 by RT PCR: NEGATIVE

## 2022-10-24 MED ORDER — BENZONATATE 100 MG PO CAPS: 100.0000 mg | ORAL_CAPSULE | Freq: Three times a day (TID) | ORAL | 0 refills | Status: AC

## 2022-10-24 MED ORDER — GUAIFENESIN ER 600 MG PO TB12: 600.0000 mg | ORAL_TABLET | Freq: Two times a day (BID) | ORAL | 0 refills | Status: AC

## 2022-10-24 NOTE — ED Notes (Addendum)
This RN tried calling mother and uncle to update about discharge. Both did not answer the phone.

## 2022-10-24 NOTE — ED Provider Notes (Signed)
[facility] EMERGENCY DEPARTMENT Provider Note   CSN: [id number] Arrival date & time: 10/24/22  1424     History  Chief Complaint  Patient presents with   URI    Kathy Brown is a 41 y.o. female.  With past medical history of atrial fibrillation, paranoid schizophrenia who presents to the emergency department with cough.  Patient states that she has had a cough and congestion for about 1 week.  Describes coughing up mucus intermittently and feeling congestion in her chest and face.  She states that she has had fever but is unable to tell me objectively and number.  States that she has felt warm intermittently.  Also endorses rhinorrhea.  Denies shortness of breath.  She states that there have been some family numbers that have been sick but she denied this to the triage PA.  Not taking any medications for her symptoms.     URI Presenting symptoms: congestion and cough        Home Medications Prior to Admission medications   Medication Sig Start Date End Date Taking? Authorizing Provider  benzonatate (TESSALON) 100 MG capsule Take 1 capsule (100 mg total) by mouth every 8 (eight) hours. 10/24/22  Yes Cristopher Peru, PA-C  guaiFENesin (MUCINEX) 600 MG 12 hr tablet Take 1 tablet (600 mg total) by mouth 2 (two) times daily. 10/24/22  Yes Cristopher Peru, PA-C  aspirin EC 81 MG EC tablet Take 1 tablet (81 mg total) by mouth daily. Swallow whole. Patient not taking: Reported on 07/04/2022 01/31/22   Rolly Salter, MD  atorvastatin (LIPITOR) 40 MG tablet Take 1 tablet (40 mg total) by mouth daily. Patient not taking: Reported on 07/04/2022 01/31/22   Rolly Salter, MD  benztropine (COGENTIN) 0.5 MG tablet Take 1 tablet (0.5 mg total) by mouth 2 (two) times daily as needed for tremors (Extraparametal side effects). Patient not taking: Reported on 07/04/2022 11/22/21   Estella Husk, MD  digoxin (LANOXIN) 0.25 MG tablet Take 1 tablet (0.25 mg total) by mouth daily.  01/31/22   Rolly Salter, MD  diphenhydrAMINE (BENADRYL) 25 mg capsule Take 25 mg by mouth 3 (three) times daily.    [provider]  haloperidol (HALDOL) 5 MG tablet Take 5 mg by mouth 3 (three) times daily.    [provider]  hydrOXYzine (ATARAX) 25 MG tablet Take 1 tablet (25 mg total) by mouth 3 (three) times daily as needed for anxiety. Patient not taking: Reported on 07/04/2022 11/22/21   Estella Husk, MD  melatonin 5 MG TABS Take 5 mg by mouth at bedtime as needed (For sleep).    [provider]  metoprolol succinate (TOPROL-XL) 25 MG 24 hr tablet Take 1 tablet (25 mg total) by mouth daily. Patient not taking: Reported on 07/04/2022 01/31/22   Rolly Salter, MD  polyethylene glycol (MIRALAX / GLYCOLAX) 17 g packet Take 17 g by mouth daily. Patient not taking: Reported on 07/04/2022 01/31/22   Rolly Salter, MD  PRESCRIPTION MEDICATION Apply 1 Application topically 3 (three) times daily. Medication: Thermacare Patch Film Topical    [provider]  risperiDONE (RISPERDAL) 1 MG tablet Take 1 tablet (1 mg total) by mouth 2 (two) times daily. Patient not taking: Reported on 07/04/2022 01/31/22 05/09/22  Rolly Salter, MD  traZODone (DESYREL) 50 MG tablet Take 1 tablet (50 mg total) by mouth at bedtime as needed for sleep. Patient not taking: Reported on 07/04/2022 01/31/22  Rolly Salter, MD      Allergies    Patient has no known allergies.    Review of Systems   Review of Systems  HENT:  Positive for congestion.   Respiratory:  Positive for cough.   All other systems reviewed and are negative.   Physical Exam Updated Vital Signs BP (!) 126/98 (BP Location: Right Arm)   Pulse 94   Temp 98.6 F (37 C) (Oral)   Resp 18   Ht 5\' 5"  (1.651 m)   Wt 65.8 kg   LMP 10/24/2022   SpO2 100%   BMI 24.13 kg/m  Physical Exam Vitals and nursing note reviewed.  Constitutional:      General: She is not in acute distress.    Appearance: Normal appearance.  She is not ill-appearing or toxic-appearing.  HENT:     Head: Normocephalic and atraumatic.     Nose: Congestion present.     Mouth/Throat:     Mouth: Mucous membranes are moist.     Pharynx: Oropharynx is clear. No oropharyngeal exudate or posterior oropharyngeal erythema.  Eyes:     General: No scleral icterus.    Extraocular Movements: Extraocular movements intact.  Cardiovascular:     Rate and Rhythm: Normal rate and regular rhythm.     Pulses: Normal pulses.     Heart sounds: No murmur heard. Pulmonary:     Effort: Pulmonary effort is normal. No respiratory distress.     Breath sounds: Normal breath sounds. No stridor. No wheezing, rhonchi or rales.  Abdominal:     Palpations: Abdomen is soft.  Musculoskeletal:     Cervical back: Neck supple.  Skin:    General: Skin is warm and dry.     Capillary Refill: Capillary refill takes less than 2 seconds.     Findings: No rash.  Neurological:     General: No focal deficit present.     Mental Status: She is alert.  Psychiatric:        Mood and Affect: Mood normal.        Speech: Speech normal.        Behavior: Behavior is cooperative.        Cognition and Memory: Cognition is impaired.        Judgment: Judgment is impulsive.     ED Results / Procedures / Treatments   Labs (all labs ordered are listed, but only abnormal results are displayed) Labs Reviewed  RESP PANEL BY RT-PCR (RSV, FLU A&B, COVID)  RVPGX2   EKG None  Radiology No results found.  Procedures Procedures   Medications Ordered in ED Medications - No data to display  ED Course/ Medical Decision Making/ A&P                           Medical Decision Making Risk OTC drugs. Prescription drug management.  Initial Impression and Ddx 41 year old female who presents to the emergency department with cough and congestion.  Sounds congested.  There is no tonsillar swelling or exudates.  Lungs are clear bilaterally.  Is nontoxic, nonseptic in appearance  and hemodynamically stable.  Obtaining respiratory viral panel.  Do not feel that she needs chest x-ray at this time. Patient PMH that increases complexity of ED encounter: Paranoid schizophrenia, atrial fibrillation  Interpretation of Diagnostics I independent reviewed and interpreted the labs as followed: COVID, flu, RSV negative  - I independently visualized the following imaging with scope of interpretation limited to  determining acute life threatening conditions related to emergency care: Not indicated  Patient Reassessment and Ultimate Disposition/Management On assessment she is well-appearing, nonseptic, nontoxic in appearance. Lungs are clear bilaterally so did not pursue chest x-ray at this time. COVID, flu, RSV is negative Does sound congested on exam.  There is no tonsillar swelling or exudates concerning for strep.  Symptoms are most consistent with a viral upper respiratory infection.  Doubt sinusitis based on my exam.  Do not suspect underlying cardiopulmonary process.  Considered but think less likely dangerous causes of her symptoms including ACS, CHF, COPD, pneumonia, pneumothorax.  Will prescribe her Mucinex and Tessalon.    The patient has been appropriately medically screened and/or stabilized in the ED. I have low suspicion for any other emergent medical condition which would require further screening, evaluation or treatment in the ED or require inpatient management. At time of discharge the patient is hemodynamically stable and in no acute distress. I have discussed work-up results and diagnosis with patient and answered all questions. Patient is agreeable with discharge plan. We discussed strict return precautions for returning to the emergency department and they verbalized understanding.    Patient management required discussion with the following services or consulting groups:  None  Complexity of Problems Addressed Acute uncomplicated illness or injury with no  diagnostics  Additional Data Reviewed and Analyzed Further history obtained from: Past medical history and medications listed in the EMR, Prior ED visit notes, Care Everywhere, and Prior labs/imaging results  Patient Encounter Risk Assessment Prescriptions and SDOH impact on management  Final Clinical Impression(s) / ED Diagnoses Final diagnoses:  Viral upper respiratory tract infection    Rx / DC Orders ED Discharge Orders          Ordered    guaiFENesin (MUCINEX) 600 MG 12 hr tablet  2 times daily        10/24/22 1746    benzonatate (TESSALON) 100 MG capsule  Every 8 hours        10/24/22 1746              Cristopher Peru, PA-C 10/24/22 1751    Terald Sleeper, MD 10/24/22 1753

## 2022-10-24 NOTE — ED Notes (Signed)
Patient given cab voucher. Confirmed address with patient.

## 2022-10-24 NOTE — ED Triage Notes (Signed)
Pt to ED c/o cold symptoms x 1 week, Reports runny nose, cough, chest congestion and generalized pain. No alleviating or aggravating factors. Pt has not taken OTC medications

## 2022-10-24 NOTE — ED Provider Triage Note (Signed)
Emergency Medicine Provider Triage Evaluation Note  Kathy Brown , a 41 y.o. female  was evaluated in triage.  Pt complains of cough and congestion. States that it has been ongoing for the past 1 week.  Denies any known sick contacts.  She has not taken any medications for symptoms. Has not checked her temperature but states she feels warm.  Review of Systems  Positive:  Negative:   Physical Exam  BP (!) 126/98 (BP Location: Right Arm)   Pulse 94   Temp 98.6 F (37 C) (Oral)   Resp 18   Ht 5\' 5"  (1.651 m)   Wt 65.8 kg   LMP 10/24/2022   SpO2 100%   BMI 24.13 kg/m  Gen:   Awake, no distress   Resp:  Normal effort  MSK:   Moves extremities without difficulty  Other:   Medical Decision Making  Medically screening exam initiated at 3:10 PM.  Appropriate orders placed.  Kathy Brown was informed that the remainder of the evaluation will be completed by another provider, this initial triage assessment does not replace that evaluation, and the importance of remaining in the ED until their evaluation is complete.     [name], PA-C 10/24/22 1511

## 2022-10-24 NOTE — Discharge Instructions (Signed)
You were seen in the emergency department today for viral upper respiratory infection.  I have sent some cough medication to the [facility] on [address] as well as a medication called Mucinex which will help with congestion.  Please return to the emergency department for significantly worsening symptoms like significant shortness of breath, chest pain or continued symptoms with fever concerning for pneumonia.  Your lungs were clear here today.  Please also follow-up with primary care provider.  You do not have a primary care provider I have provided information to [facility] health community health and wellness.

## 2022-11-14 DIAGNOSIS — F209 Schizophrenia, unspecified: Secondary | ICD-10-CM | POA: Diagnosis not present

## 2022-12-17 ENCOUNTER — Emergency Department (HOSPITAL_COMMUNITY)
Admission: EM | Admit: 2022-12-17 | Discharge: 2022-12-18 | Disposition: A | Discharge status: Other Acute Inpatient Hospital | Payer: Medicare Other | Attending: Emergency Medicine | Admitting: Emergency Medicine

## 2022-12-17 ENCOUNTER — Ambulatory Visit (HOSPITAL_COMMUNITY)
Admission: EM | Admit: 2022-12-17 | Discharge: 2022-12-17 | Disposition: A | Discharge status: Other Acute Inpatient Hospital | Payer: Medicare Other

## 2022-12-17 DIAGNOSIS — Z046 Encounter for general psychiatric examination, requested by authority: Secondary | ICD-10-CM

## 2022-12-17 DIAGNOSIS — R259 Unspecified abnormal involuntary movements: Secondary | ICD-10-CM | POA: Diagnosis not present

## 2022-12-17 DIAGNOSIS — F29 Unspecified psychosis not due to a substance or known physiological condition: Secondary | ICD-10-CM

## 2022-12-17 DIAGNOSIS — R440 Auditory hallucinations: Secondary | ICD-10-CM | POA: Diagnosis not present

## 2022-12-17 DIAGNOSIS — Z20822 Contact with and (suspected) exposure to covid-19: Secondary | ICD-10-CM | POA: Diagnosis not present

## 2022-12-17 DIAGNOSIS — R9431 Abnormal electrocardiogram [ECG] [EKG]: Secondary | ICD-10-CM | POA: Diagnosis not present

## 2022-12-17 LAB — COMPREHENSIVE METABOLIC PANEL
ALT: 10 U/L (ref 0–44)
AST: 17 U/L (ref 15–41)
Albumin: 3.8 g/dL (ref 3.5–5.0)
Alkaline Phosphatase: 40 U/L (ref 38–126)
Anion gap: 13 (ref 5–15)
BUN: 8 mg/dL (ref 6–20)
CO2: 22 mmol/L (ref 22–32)
Calcium: 9.5 mg/dL (ref 8.9–10.3)
Chloride: 103 mmol/L (ref 98–111)
Creatinine, Ser: 0.89 mg/dL (ref 0.44–1.00)
GFR, Estimated: >60 mL/min (ref >60)
Glucose, Bld: 92 mg/dL (ref 70–99)
Potassium: 3.5 mmol/L (ref 3.5–5.1)
Sodium: 138 mmol/L (ref 135–145)
Total Bilirubin: 0.6 mg/dL (ref 0.3–1.2)
Total Protein: 7.1 g/dL (ref 6.5–8.1)

## 2022-12-17 LAB — CBC
HCT: 36.1 % (ref 36.0–46.0)
Hemoglobin: 11.1 g/dL — ABNORMAL LOW (ref 12.0–15.0)
MCH: 26.2 pg (ref 26.0–34.0)
MCHC: 30.7 g/dL (ref 30.0–36.0)
MCV: 85.3 fL (ref 80.0–100.0)
Platelets: 262 10*3/uL (ref 150–400)
RBC: 4.23 MIL/uL (ref 3.87–5.11)
RDW: 16.1 % — ABNORMAL HIGH (ref 11.5–15.5)
WBC: 2.9 10*3/uL — ABNORMAL LOW (ref 4.0–10.5)
nRBC: 0 % (ref 0.0–0.2)

## 2022-12-17 LAB — RAPID URINE DRUG SCREEN, HOSP PERFORMED
Amphetamines: NOT DETECTED
Barbiturates: NOT DETECTED
Benzodiazepines: NOT DETECTED
Cocaine: NOT DETECTED
Opiates: NOT DETECTED
Tetrahydrocannabinol: NOT DETECTED

## 2022-12-17 LAB — I-STAT BETA HCG BLOOD, ED (MC, WL, AP ONLY): I-stat hCG, quantitative: <5 m[IU]/mL (ref <5)

## 2022-12-17 LAB — ACETAMINOPHEN LEVEL: Acetaminophen (Tylenol), Serum: <10 ug/mL — ABNORMAL LOW (ref 10–30)

## 2022-12-17 LAB — ETHANOL: Alcohol, Ethyl (B): <10 mg/dL (ref <10)

## 2022-12-17 LAB — RESP PANEL BY RT-PCR (RSV, FLU A&B, COVID)  RVPGX2
Influenza A by PCR: NEGATIVE
Influenza B by PCR: NEGATIVE
Resp Syncytial Virus by PCR: NEGATIVE
SARS Coronavirus 2 by RT PCR: NEGATIVE

## 2022-12-17 LAB — SALICYLATE LEVEL: Salicylate Lvl: <7 mg/dL — ABNORMAL LOW (ref 7.0–30.0)

## 2022-12-17 MED ORDER — ACETAMINOPHEN 500 MG PO TABS
1000.0000 mg | ORAL_TABLET | Freq: Once | ORAL | Status: AC
  Administered 2022-12-17: 1000 mg via ORAL
  Filled 2022-12-17: qty 2

## 2022-12-17 NOTE — BH Assessment (Signed)
Clinician messaged Fuller Canada, RN: "Hey. It's Trey with TTS. Is the pt able to engage in the assessment? Can you fax the pt's IVC paperwork to [phone]? Is the pt medically cleared?"  Clinician awaiting response.    Vertell Novak, [place], [facility], [facility] Triage Specialist [phone]
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
## 2022-12-17 NOTE — ED Triage Notes (Signed)
Patient with history of schizophrenia brought in by [name] under IVC for evaluation of paranoia. Patient fearful but cooperative with [name].

## 2022-12-17 NOTE — Consult Note (Signed)
Attempted to see patient for psychiatric assessment via tts cart.  Per [place], NT, pt is in the hallway and no room available to complete assessment.  Advised to let tts know when patient can be placed in room for privacy during assessment.

## 2022-12-17 NOTE — ED Notes (Signed)
TTS in process-Monique,RN  

## 2022-12-17 NOTE — ED Notes (Signed)
Patient transported to [facility]. Patient to  acute for the [facility] at this time. Spoke with Benjamin Stain, DON and she agreed with decision.
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
## 2022-12-18 DIAGNOSIS — F411 Generalized anxiety disorder: Secondary | ICD-10-CM | POA: Diagnosis not present

## 2022-12-18 DIAGNOSIS — Z20822 Contact with and (suspected) exposure to covid-19: Secondary | ICD-10-CM | POA: Diagnosis not present

## 2022-12-18 DIAGNOSIS — E785 Hyperlipidemia, unspecified: Secondary | ICD-10-CM | POA: Diagnosis not present

## 2022-12-18 DIAGNOSIS — G8929 Other chronic pain: Secondary | ICD-10-CM | POA: Diagnosis not present

## 2022-12-18 DIAGNOSIS — F29 Unspecified psychosis not due to a substance or known physiological condition: Secondary | ICD-10-CM | POA: Diagnosis not present

## 2022-12-18 DIAGNOSIS — I4891 Unspecified atrial fibrillation: Secondary | ICD-10-CM | POA: Diagnosis not present

## 2022-12-18 DIAGNOSIS — F32A Depression, unspecified: Secondary | ICD-10-CM | POA: Diagnosis not present

## 2022-12-18 DIAGNOSIS — F200 Paranoid schizophrenia: Secondary | ICD-10-CM | POA: Diagnosis not present

## 2022-12-18 DIAGNOSIS — R45850 Homicidal ideations: Secondary | ICD-10-CM | POA: Diagnosis not present

## 2022-12-18 MED ORDER — HALOPERIDOL 5 MG PO TABS
5.0000 mg | ORAL_TABLET | Freq: Three times a day (TID) | ORAL | Status: DC
  Administered 2022-12-18: 5 mg via ORAL
  Filled 2022-12-18: qty 1

## 2022-12-18 MED ORDER — ATORVASTATIN CALCIUM 40 MG PO TABS
40.0000 mg | ORAL_TABLET | Freq: Every day | ORAL | Status: DC
  Administered 2022-12-18: 40 mg via ORAL
  Filled 2022-12-18: qty 1

## 2022-12-18 MED ORDER — ASPIRIN 81 MG PO TBEC
81.0000 mg | DELAYED_RELEASE_TABLET | Freq: Every day | ORAL | Status: DC
  Administered 2022-12-18: 81 mg via ORAL
  Filled 2022-12-18: qty 1

## 2022-12-18 MED ORDER — DIGOXIN 125 MCG PO TABS: 0.2500 mg | ORAL_TABLET | Freq: Every day | ORAL | Status: DC
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
## 2022-12-18 NOTE — ED Provider Notes (Signed)
Emergency Medicine Observation Re-evaluation Note  Kathy Brown is a 42 y.o. female, seen on rounds today.  Pt initially presented to the ED for complaints of No chief complaint on file. Currently, the patient is resting comfortably, she just had her breakfast..  Physical Exam  BP 115/75 (BP Location: Right Arm)   Pulse 66   Temp 98.4 F (36.9 C) (Oral)   Resp 17   SpO2 100%  Physical Exam General: No acute distress Cardiac: Regular rate Lungs: No respiratory distress Psych: Calm  ED Course / MDM  EKG:   I have reviewed the labs performed to date as well as medications administered while in observation.  Recent changes in the last 24 hours include no acute changes.  Nursing staff have no concerns at this point.  Behavioral health team had assessed the patient yesterday, the requested inpatient admission.  Patient is involuntarily committed.  Plan  Current plan is for continued expectant management in the emergency room.    Varney Biles, MD 12/18/22 1000

## 2022-12-18 NOTE — Progress Notes (Addendum)
Inpatient Behavioral Health placement  Pt meets inpatient criteria per Evette Georges, NP. There are no available beds at [facility] per [place], RN. Pt has history DX of IDD. CSW First shift to follow up and inquire about pt's IQ score and overall functioning. Referral was sent to the following facilities;     Service Provider Address Phone Fax  [place] [place] [address] [place]  [facility]  [address] [place] [address] [phone] [address] [place] [address] [phone]  [facility]  [place], [place] [address] [place]  [facility] [place] [address] [place]  [facility]  [address] [place] [address] [phone]  [facility]  [place], [place] [address] [phone]  [facility]  [address] [place] [address] [place]  [facility]  [address] [place] [address] [phone]  [facility]  [address] [place] [address] [phone]  [place] [address] [phone]  [facility]  [place] [address] [phone]  [facility]  [address], [place] [address] [phone] [facility]  [place] [address] [phone] [facility]  [address], [place] [address] [place]  [address] [place]  [address] [phone]  [facility]  [address] [place] [address] [place]  [facility] Office  [address], [place] [address] [phone]  [facility]  [address] [place] [address] [phone]  [facility]  [address] [place]  [address] [phone]    Situation ongoing,  CSW will follow up.   Benjaman Kindler, MSW, LCSWA 12/18/2022  @ 2:07 AM

## 2022-12-18 NOTE — BH Assessment (Addendum)
Clinician contacted after hours line for [facility] [phone]) based on information documented by the [facility] in the pt's IVC. Clinician spoke to [facility] who took clinician's name and contact information.   Clinician awaiting call back from DSS.   Vertell Novak, [place], [facility], [facility] Triage Specialist [phone]

## 2022-12-18 NOTE — Progress Notes (Addendum)
Pt was accepted to [facility] 12/18/2022. Bed assignment: Oak unit  Pt meets inpatient criteria per Merlyn Lot, NP  Attending Physician will be Brantley Fling, MD  Report can be called to: [phone]  Bed is ready now  Care Team Notified: Merlyn Lot, NP, [place] Dec, RN, and [address] LCSWA  [place], [place]  12/18/2022 9:59 AM

## 2022-12-18 NOTE — BH Assessment (Addendum)
Clinician spoke to Wes Early with [facility] and discussed areas of concern that was listed in the IVC. Clinician asked questions from [place].    Vertell Novak, [place], [facility], [facility] Triage Specialist [phone]

## 2022-12-19 DIAGNOSIS — F411 Generalized anxiety disorder: Secondary | ICD-10-CM | POA: Diagnosis not present

## 2022-12-19 DIAGNOSIS — F200 Paranoid schizophrenia: Secondary | ICD-10-CM | POA: Diagnosis not present

## 2022-12-20 DIAGNOSIS — F200 Paranoid schizophrenia: Secondary | ICD-10-CM | POA: Diagnosis not present

## 2022-12-20 DIAGNOSIS — F411 Generalized anxiety disorder: Secondary | ICD-10-CM | POA: Diagnosis not present

## 2022-12-21 DIAGNOSIS — F411 Generalized anxiety disorder: Secondary | ICD-10-CM | POA: Diagnosis not present

## 2022-12-21 DIAGNOSIS — F200 Paranoid schizophrenia: Secondary | ICD-10-CM | POA: Diagnosis not present

## 2022-12-22 DIAGNOSIS — F411 Generalized anxiety disorder: Secondary | ICD-10-CM | POA: Diagnosis not present

## 2022-12-22 DIAGNOSIS — F200 Paranoid schizophrenia: Secondary | ICD-10-CM | POA: Diagnosis not present

## 2022-12-23 DIAGNOSIS — F411 Generalized anxiety disorder: Secondary | ICD-10-CM | POA: Diagnosis not present

## 2022-12-23 DIAGNOSIS — F200 Paranoid schizophrenia: Secondary | ICD-10-CM | POA: Diagnosis not present

## 2022-12-24 DIAGNOSIS — F200 Paranoid schizophrenia: Secondary | ICD-10-CM | POA: Diagnosis not present

## 2022-12-24 DIAGNOSIS — F411 Generalized anxiety disorder: Secondary | ICD-10-CM | POA: Diagnosis not present

## 2023-08-05 ENCOUNTER — Emergency Department (HOSPITAL_COMMUNITY)
Admission: EM | Admit: 2023-08-05 | Discharge: 2023-08-06 | Disposition: A | Discharge status: Home / Self Care | Payer: Medicare Other | Attending: Emergency Medicine | Admitting: Emergency Medicine

## 2023-08-05 ENCOUNTER — Other Ambulatory Visit: Payer: Self-pay

## 2023-08-05 ENCOUNTER — Encounter (HOSPITAL_COMMUNITY): Payer: Self-pay | Admitting: Emergency Medicine

## 2023-08-05 DIAGNOSIS — Z1152 Encounter for screening for COVID-19: Secondary | ICD-10-CM | POA: Diagnosis not present

## 2023-08-05 DIAGNOSIS — Z79899 Other long term (current) drug therapy: Secondary | ICD-10-CM | POA: Diagnosis not present

## 2023-08-05 DIAGNOSIS — I4891 Unspecified atrial fibrillation: Secondary | ICD-10-CM | POA: Diagnosis not present

## 2023-08-05 DIAGNOSIS — F309 Manic episode, unspecified: Secondary | ICD-10-CM | POA: Diagnosis not present

## 2023-08-05 DIAGNOSIS — R451 Restlessness and agitation: Secondary | ICD-10-CM | POA: Diagnosis not present

## 2023-08-05 DIAGNOSIS — F200 Paranoid schizophrenia: Secondary | ICD-10-CM | POA: Diagnosis not present

## 2023-08-05 DIAGNOSIS — F29 Unspecified psychosis not due to a substance or known physiological condition: Secondary | ICD-10-CM | POA: Insufficient documentation

## 2023-08-05 DIAGNOSIS — Z7982 Long term (current) use of aspirin: Secondary | ICD-10-CM | POA: Diagnosis not present

## 2023-08-05 DIAGNOSIS — R58 Hemorrhage, not elsewhere classified: Secondary | ICD-10-CM | POA: Diagnosis not present

## 2023-08-05 LAB — CBC WITH DIFFERENTIAL/PLATELET
Abs Immature Granulocytes: 0.01 10*3/uL (ref 0.00–0.07)
Basophils Absolute: 0 10*3/uL (ref 0.0–0.1)
Basophils Relative: 1 %
Eosinophils Absolute: 0 10*3/uL (ref 0.0–0.5)
Eosinophils Relative: 1 %
HCT: 40.1 % (ref 36.0–46.0)
Hemoglobin: 11.9 g/dL — ABNORMAL LOW (ref 12.0–15.0)
Immature Granulocytes: 0 %
Lymphocytes Relative: 41 %
Lymphs Abs: 1.9 10*3/uL (ref 0.7–4.0)
MCH: 25.1 pg — ABNORMAL LOW (ref 26.0–34.0)
MCHC: 29.7 g/dL — ABNORMAL LOW (ref 30.0–36.0)
MCV: 84.4 fL (ref 80.0–100.0)
Monocytes Absolute: 0.3 10*3/uL (ref 0.1–1.0)
Monocytes Relative: 8 %
Neutro Abs: 2.2 10*3/uL (ref 1.7–7.7)
Neutrophils Relative %: 49 %
Platelets: DECREASED 10*3/uL (ref 150–400)
RBC: 4.75 MIL/uL (ref 3.87–5.11)
RDW: 18.3 % — ABNORMAL HIGH (ref 11.5–15.5)
WBC: 4.5 10*3/uL (ref 4.0–10.5)
nRBC: 0 % (ref 0.0–0.2)

## 2023-08-05 LAB — COMPREHENSIVE METABOLIC PANEL
ALT: 13 U/L (ref 0–44)
AST: 21 U/L (ref 15–41)
Albumin: 4.1 g/dL (ref 3.5–5.0)
Alkaline Phosphatase: 53 U/L (ref 38–126)
Anion gap: 7 (ref 5–15)
BUN: 9 mg/dL (ref 6–20)
CO2: 24 mmol/L (ref 22–32)
Calcium: 8.8 mg/dL — ABNORMAL LOW (ref 8.9–10.3)
Chloride: 104 mmol/L (ref 98–111)
Creatinine, Ser: 0.63 mg/dL (ref 0.44–1.00)
GFR, Estimated: >60 mL/min (ref >60)
Glucose, Bld: 87 mg/dL (ref 70–99)
Potassium: 3.9 mmol/L (ref 3.5–5.1)
Sodium: 135 mmol/L (ref 135–145)
Total Bilirubin: 0.8 mg/dL (ref 0.3–1.2)
Total Protein: 8 g/dL (ref 6.5–8.1)

## 2023-08-05 LAB — TROPONIN I (HIGH SENSITIVITY): Troponin I (High Sensitivity): 4 ng/L (ref <18)

## 2023-08-05 LAB — DIGOXIN LEVEL: Digoxin Level: <0.2 ng/mL — ABNORMAL LOW (ref 0.8–2.0)

## 2023-08-05 LAB — RESP PANEL BY RT-PCR (RSV, FLU A&B, COVID)  RVPGX2
Influenza A by PCR: NEGATIVE
Influenza B by PCR: NEGATIVE
Resp Syncytial Virus by PCR: NEGATIVE
SARS Coronavirus 2 by RT PCR: NEGATIVE

## 2023-08-05 LAB — RAPID URINE DRUG SCREEN, HOSP PERFORMED
Amphetamines: NOT DETECTED
Barbiturates: NOT DETECTED
Benzodiazepines: NOT DETECTED
Cocaine: NOT DETECTED
Opiates: NOT DETECTED
Tetrahydrocannabinol: NOT DETECTED

## 2023-08-05 LAB — ETHANOL: Alcohol, Ethyl (B): <10 mg/dL (ref <10)

## 2023-08-05 LAB — CK: Total CK: 162 U/L (ref 38–234)

## 2023-08-05 LAB — PREGNANCY, URINE: Preg Test, Ur: NEGATIVE

## 2023-08-05 MED ORDER — ACETAMINOPHEN 325 MG PO TABS
650.0000 mg | ORAL_TABLET | ORAL | Status: DC | PRN
  Administered 2023-08-05: 650 mg via ORAL
  Filled 2023-08-05 (×2): qty 2

## 2023-08-05 MED ORDER — ZOLPIDEM TARTRATE 5 MG PO TABS: 5.0000 mg | ORAL_TABLET | Freq: Every evening | ORAL | Status: DC | PRN

## 2023-08-05 MED ORDER — RISPERIDONE 1 MG PO TABS
1.0000 mg | ORAL_TABLET | Freq: Every day | ORAL | Status: DC
  Administered 2023-08-05: 1 mg via ORAL
  Filled 2023-08-05: qty 1

## 2023-08-05 MED ORDER — LORAZEPAM 1 MG PO TABS: 1.0000 mg | ORAL_TABLET | ORAL | Status: DC | PRN

## 2023-08-05 MED ORDER — RISPERIDONE 0.5 MG PO TBDP
2.0000 mg | ORAL_TABLET | Freq: Three times a day (TID) | ORAL | Status: DC | PRN
  Administered 2023-08-05: 2 mg via ORAL
  Filled 2023-08-05: qty 4

## 2023-08-05 MED ORDER — ZIPRASIDONE MESYLATE 20 MG IM SOLR: 20.0000 mg | INTRAMUSCULAR | Status: DC | PRN

## 2023-08-05 NOTE — ED Provider Notes (Deleted)
[facility] EMERGENCY DEPARTMENT AT [facility] Provider Note   CSN: [id number] Arrival date & time: 08/05/23  1152     History  Chief Complaint  Patient presents with   Manic Behavior    Kathy Brown is a 42 y.o. female.  HPI    42 year old female comes in with chief complaint of manic behavior.  Patient has history of schizoaffective disorder, psychosis.  She was just seen in the ER for agitation.  Patient was calm in the ER, discharged -however upon transport, she started screaming and swinging at the transport staff.  She was brought back to the emergency room.  Patient now calm again.  Transfer team not comfortable taking patient back this way.  We were still unable to get in touch with [facility] stays.  Home Medications Prior to Admission medications   Medication Sig Start Date End Date Taking? Authorizing Provider  aspirin EC 81 MG EC tablet Take 1 tablet (81 mg total) by mouth daily. Swallow whole. Patient not taking: Reported on 07/04/2022 01/31/22   Rolly Salter, MD  atorvastatin (LIPITOR) 40 MG tablet Take 1 tablet (40 mg total) by mouth daily. Patient not taking: Reported on 07/04/2022 01/31/22   Rolly Salter, MD  benzonatate (TESSALON) 100 MG capsule Take 1 capsule (100 mg total) by mouth every 8 (eight) hours. 10/24/22   Cristopher Peru, PA-C  benztropine (COGENTIN) 0.5 MG tablet Take 1 tablet (0.5 mg total) by mouth 2 (two) times daily as needed for tremors (Extraparametal side effects). Patient not taking: Reported on 07/04/2022 11/22/21   Estella Husk, MD  digoxin (LANOXIN) 0.25 MG tablet Take 1 tablet (0.25 mg total) by mouth daily. 01/31/22   Rolly Salter, MD  diphenhydrAMINE (BENADRYL) 25 mg capsule Take 25 mg by mouth 3 (three) times daily.    [provider]  guaiFENesin (MUCINEX) 600 MG 12 hr tablet Take 1 tablet (600 mg total) by mouth 2 (two) times daily. 10/24/22   Cristopher Peru, PA-C  haloperidol (HALDOL) 5  MG tablet Take 5 mg by mouth 3 (three) times daily.    [provider]  hydrOXYzine (ATARAX) 25 MG tablet Take 1 tablet (25 mg total) by mouth 3 (three) times daily as needed for anxiety. Patient not taking: Reported on 07/04/2022 11/22/21   Estella Husk, MD  melatonin 5 MG TABS Take 5 mg by mouth at bedtime as needed (For sleep).    [provider]  metoprolol succinate (TOPROL-XL) 25 MG 24 hr tablet Take 1 tablet (25 mg total) by mouth daily. Patient not taking: Reported on 07/04/2022 01/31/22   Rolly Salter, MD  polyethylene glycol (MIRALAX / GLYCOLAX) 17 g packet Take 17 g by mouth daily. Patient not taking: Reported on 07/04/2022 01/31/22   Rolly Salter, MD  PRESCRIPTION MEDICATION Apply 1 Application topically 3 (three) times daily. Medication: Thermacare Patch Film Topical    [provider]  risperiDONE (RISPERDAL) 1 MG tablet Take 1 tablet (1 mg total) by mouth 2 (two) times daily. Patient not taking: Reported on 07/04/2022 01/31/22 05/09/22  Rolly Salter, MD  traZODone (DESYREL) 50 MG tablet Take 1 tablet (50 mg total) by mouth at bedtime as needed for sleep. Patient not taking: Reported on 07/04/2022 01/31/22   Rolly Salter, MD      Allergies    Patient has no known allergies.    Review of Systems   Review of Systems  All  other systems reviewed and are negative.   Physical Exam Updated Vital Signs BP (!) 143/114   Pulse 95   Temp 98.5 F (36.9 C) (Oral)   Resp 10   SpO2 100%  Physical Exam Vitals and nursing note reviewed.  Constitutional:      Appearance: She is well-developed.  HENT:     Head: Normocephalic and atraumatic.  Eyes:     Extraocular Movements: Extraocular movements intact.  Cardiovascular:     Rate and Rhythm: Normal rate.  Pulmonary:     Effort: Pulmonary effort is normal.  Musculoskeletal:     Cervical back: Normal range of motion and neck supple.  Skin:    General: Skin is dry.  Neurological:     Mental Status:  She is alert. Mental status is at baseline.     Comments: No clear hyperreflexia or rigidity     ED Results / Procedures / Treatments   Labs (all labs ordered are listed, but only abnormal results are displayed) Labs Reviewed - No data to display  EKG None  Radiology No results found.  Procedures Procedures    Medications Ordered in ED Medications  risperiDONE (RISPERDAL M-TABS) disintegrating tablet 2 mg (has no administration in time range)    And  LORazepam (ATIVAN) tablet 1 mg (has no administration in time range)    And  ziprasidone (GEODON) injection 20 mg (has no administration in time range)  acetaminophen (TYLENOL) tablet 650 mg (has no administration in time range)  zolpidem (AMBIEN) tablet 5 mg (has no administration in time range)    ED Course/ Medical Decision Making/ A&P                                 Medical Decision Making Risk OTC drugs. Prescription drug management.   Patient comes to the ER with cc of agitation.  Patient [facility] for agitation overnight and patient not sleeping.  She was cooperative in the ER, discharged but was brought back in because of combative behavior  Patient has pertinent past medical history of A-fib, psychosis, schizoaffective disorder.  Patient came to the emergency room screaming and yelling, however Currently patient is calm again.  She informs me that she was just upset.  Pt denies nausea, emesis, fevers, chills, chest pains, shortness of breath, headaches, abdominal pain, uti like symptoms and patient has no active medical complaints. I have reviewed previous encounters for this patient and reviewed their primary medications.  Differential diagnosis considered for this patient includes: Depression Bipolar disorder Schizophrenia Substance abuse Suicidal ideation Acute withdrawal  I did consider serotonin syndrome in the differential, but patient has no autonomic  changes, does not show any rigidity, hyperreactivity or tremors.  She is agitated, but that could be because of decompensation of her psychosis.  Appropriate labs have been ordered. Patient is medically cleared for psychiatric evaluation.    Final Clinical Impression(s) / ED Diagnoses Final diagnoses:  Psychosis, unspecified psychosis type [facility])    Rx / DC Orders ED Discharge Orders     None         Derwood Kaplan, MD 08/05/23 1531

## 2023-08-05 NOTE — ED Provider Notes (Signed)
[facility] EMERGENCY DEPARTMENT AT [facility] Provider Note   CSN: [id number] Arrival date & time: 08/05/23  1152      History  Chief Complaint  Patient presents with   Manic Behavior    Kathy Brown is a 42 y.o. female.  HPI  42 year old female comes with chief complaint of manic behavior Patient brought to the emergency room from the streets.  Patient allegedly was yelling and screaming hysterically.  She is complaining of pain to her chest, mild and cramping in her legs.  She was also complaining to the EMS that she was having vaginal bleeding.  Patient has history of A-fib -and it appears that she is on digoxin.  Currently patient is not complaining of any shortness of breath, abdominal pain.  Her primary complaint medically is mild pain and chest pain.     Home Medications Prior to Admission medications   Medication Sig Start Date End Date Taking? Authorizing Provider  aspirin EC 81 MG EC tablet Take 1 tablet (81 mg total) by mouth daily. Swallow whole. Patient not taking: Reported on 07/04/2022 01/31/22   Rolly Salter, MD  atorvastatin (LIPITOR) 40 MG tablet Take 1 tablet (40 mg total) by mouth daily. Patient not taking: Reported on 07/04/2022 01/31/22   Rolly Salter, MD  benzonatate (TESSALON) 100 MG capsule Take 1 capsule (100 mg total) by mouth every 8 (eight) hours. 10/24/22   Cristopher Peru, PA-C  benztropine (COGENTIN) 0.5 MG tablet Take 1 tablet (0.5 mg total) by mouth 2 (two) times daily as needed for tremors (Extraparametal side effects). Patient not taking: Reported on 07/04/2022 11/22/21   Estella Husk, MD  digoxin (LANOXIN) 0.25 MG tablet Take 1 tablet (0.25 mg total) by mouth daily. 01/31/22   Rolly Salter, MD  diphenhydrAMINE (BENADRYL) 25 mg capsule Take 25 mg by mouth 3 (three) times daily.    [provider]  guaiFENesin (MUCINEX) 600 MG 12 hr tablet Take 1 tablet (600 mg total) by mouth 2 (two) times daily. 10/24/22   Cristopher Peru, PA-C  haloperidol (HALDOL) 5 MG tablet Take 5 mg by mouth 3 (three) times daily.    [provider]  hydrOXYzine (ATARAX) 25 MG tablet Take 1 tablet (25 mg total) by mouth 3 (three) times daily as needed for anxiety. Patient not taking: Reported on 07/04/2022 11/22/21   Estella Husk, MD  melatonin 5 MG TABS Take 5 mg by mouth at bedtime as needed (For sleep).    [provider]  metoprolol succinate (TOPROL-XL) 25 MG 24 hr tablet Take 1 tablet (25 mg total) by mouth daily. Patient not taking: Reported on 07/04/2022 01/31/22   Rolly Salter, MD  polyethylene glycol (MIRALAX / GLYCOLAX) 17 g packet Take 17 g by mouth daily. Patient not taking: Reported on 07/04/2022 01/31/22   Rolly Salter, MD  PRESCRIPTION MEDICATION Apply 1 Application topically 3 (three) times daily. Medication: Thermacare Patch Film Topical    [provider]  risperiDONE (RISPERDAL) 1 MG tablet Take 1 tablet (1 mg total) by mouth 2 (two) times daily. Patient not taking: Reported on 07/04/2022 01/31/22 05/09/22  Rolly Salter, MD  traZODone (DESYREL) 50 MG tablet Take 1 tablet (50 mg total) by mouth at bedtime as needed for sleep. Patient not taking: Reported on 07/04/2022 01/31/22   Rolly Salter, MD      Allergies    Patient has no known allergies.    Review of  Systems   Review of Systems  All other systems reviewed and are negative.   Physical Exam Updated Vital Signs BP (!) 143/114   Pulse 95   Temp 98.5 F (36.9 C) (Oral)   Resp 10   SpO2 100%  Physical Exam Vitals and nursing note reviewed.  Constitutional:      Appearance: She is well-developed.  HENT:     Head: Atraumatic.  Eyes:     Extraocular Movements: Extraocular movements intact.     Pupils: Pupils are equal, round, and reactive to light.  Cardiovascular:     Rate and Rhythm: Normal rate.  Pulmonary:     Effort: Pulmonary effort is normal.  Musculoskeletal:     Cervical back: Normal range of motion and  neck supple.  Skin:    General: Skin is warm and dry.  Neurological:     Mental Status: She is alert and oriented to person, place, and time.  Psychiatric:        Attention and Perception: Attention normal.        Mood and Affect: Mood is anxious.        Speech: Speech normal.        Behavior: Behavior is agitated.     ED Results / Procedures / Treatments   Labs (all labs ordered are listed, but only abnormal results are displayed) Labs Reviewed - No data to display  EKG None  Radiology No results found.  Procedures Procedures    Medications Ordered in ED Medications  risperiDONE (RISPERDAL M-TABS) disintegrating tablet 2 mg (has no administration in time range)    And  LORazepam (ATIVAN) tablet 1 mg (has no administration in time range)    And  ziprasidone (GEODON) injection 20 mg (has no administration in time range)  acetaminophen (TYLENOL) tablet 650 mg (has no administration in time range)  zolpidem (AMBIEN) tablet 5 mg (has no administration in time range)    ED Course/ Medical Decision Making/ A&P                                 Medical Decision Making Risk OTC drugs. Prescription drug management.   Patient comes to the ER with cc of screaming, yelling, agitation. Patient has pertinent past medical history of schizoaffective disorder, A-fib, CHF on digoxin Currently patient is complaining of pain to her chest, body, mild.  She is disorganized with her thoughts and needs redirection. To EMS she mention vaginal bleeding, but she denies any abdominal pain or bleeding to me.  Pt denies nausea, emesis, fevers, chills, shortness of breath, headaches, abdominal pain, uti like symptoms and patient has no active medical complaints.  I have reviewed previous encounters for this patient and reviewed their primary medications.  Differential diagnosis considered for this patient includes: Depression Bipolar disorder Schizophrenia Substance abuse Suicidal  ideation Acute withdrawal  Appropriate labs have been ordered. Patient is medically cleared for psychiatric evaluation.   Final Clinical Impression(s) / ED Diagnoses Final diagnoses:  Psychosis, unspecified psychosis type [facility])    Rx / DC Orders ED Discharge Orders     None         Derwood Kaplan, MD 08/05/23 1559

## 2023-08-05 NOTE — Progress Notes (Signed)
LCSW Progress Note  [id number]   Kathy Brown  08/05/2023  6:05 PM    Inpatient Behavioral Health Placement  Pt meets inpatient criteria per Phebe Colla, NP. There are no available beds within [facility] BH system per [facility] Danika Riley,RN. Referral was sent to the following facilities;   Destination  Service Provider Address Phone [facility]  [address] [place], [place] [address] [phone]  [facility]  [address] [place] [address] [phone]  [facility]  [address] [place] [address] [phone]  [facility]  [place] [address] [phone]  [facility]  [address] [place] [address] [phone]  [facility]  [address] [place], [place] [address] [phone]  [facility]  [address] [place], [place] [address] [phone]  [facility]  [address] [place] [address] [phone]  [facility] EFAX  [address] [place], [place] [phone]  [facility]  [address], [place] [address] [phone]  [facility]  [address], [place] [address] [phone]  [facility]  [address], [place] [address] [phone]  [facility]  [address] [place], [place] [address] [phone]  [facility]  [address], [place] [address] [phone]  [facility]  [place] [phone]  [facility]  [address] [place] [address] [phone]  [facility] Health Patient Placement  [facility], [place] [phone]  [facility]  [address] [place] [address] [phone]  [facility]  [address] [place] [address] [phone]  [facility]  [address] [place], [place] [address] [phone]  [facility]  [address] [place] [address] [phone]  [facility]  [address], [place] [address] [phone]  [facility]  [address], [place] [address] [phone]  [facility]  [address] [place] [address] [phone]    Situation ongoing,  CSW will follow up.    Maryjean Ka, MSW, LCSWA 08/05/2023 6:05 PM

## 2023-08-05 NOTE — ED Notes (Signed)
[facility] called pts mother Nadara Mustard) for collateral. Pts mother said that pt was in the street yelling and a neighbor called the police. Pt was transported to the hospital by EMS. Pt was not taking medication for about 3-4 months so she is now getting monthly injections through [facility]. Pt received her last injection last week.   Jacquelynn Cree, [facility]  08/05/23

## 2023-08-05 NOTE — Progress Notes (Signed)
Pt was accepted to [facility] TOMORROW  08/06/2023 ; Bed Assignment Deatra Canter A   Pt meets inpatient criteria per Joaquim Nam  Attending Physician will be Daine Floras, MD  Report can be called to: [phone]  Pt can arrive after: 8:00am  Care Team notified:Kyra Patric Dykes, Paramedic   Kelton Pillar, LCSWA 08/05/2023 @ 6:24 PM

## 2023-08-05 NOTE — Consult Note (Addendum)
[facility] ED ASSESSMENT   Reason for Consult:  Psych consult Referring Physician:  Derwood Kaplan Patient Identification: Kathy Brown MRN:  [id number] ED Chief Complaint: Paranoid schizophrenia [facility])  Diagnosis:  Principal Problem:   Paranoid schizophrenia [facility])   ED Assessment Time Calculation: Start Time: 1500 Stop Time: 1600 Total Time in Minutes (Assessment Completion): 60  HPI:  Kathy Brown is a 42 y.o. female patient brought in by EMS.  She was found yelling and crying hysterically saying "I need help. I need help". Patient has a history of schizoaffective disorder, psychosis, paranoid schizophrenia and a-fib.  Subjective:   Kathy Brown is a 42 y.o. female patient brought in by EMS.  She was found yelling and crying hysterically saying "I need help. I need help". Patient has a history of schizoaffective disorder, psychosis, paranoid schizophrenia and a-fib.  Kathy Brown, 42 y.o., female patient seen face to face by this provider, consulted with Dr. Viviano Simas; and chart reviewed on 08/05/23.  On evaluation Kathy Brown reports she was at home and that she is hearing voices telling her to hurt others.  Patient also stated she is unable to chew with her dentures in her mouth.  She says she lives with her parents and her children.  Patient states she currently has an ACTT team with [facility] and that her providers include Jasmin RN and Dr Alla German.        During evaluation Kathy Brown is laying in bed in no acute distress.  She is alert, oriented x 2, calm, cooperative and attentive.  Her mood is euthymic with congruent affect.  She has garbled speech, and relatively normal behavior.  Objectively there is evidence of psychosis/mania and delusional thinking.  Patient is unable to converse coherently. She denies suicidal/self-harm/homicidal ideation. She endorses psychosis. Patient answered questions appropriately.     Past Psychiatric History: Previous hospitalizations.  HX of  schizoaffective disorder, psychosis, paranoid schizophrenia  Risk to Self or Others: Is the patient at risk to self? No Has the patient been a risk to self in the past 6 months? No Has the patient been a risk to self within the distant past? No Is the patient a risk to others? Yes Has the patient been a risk to others in the past 6 months? Yes Has the patient been a risk to others within the distant past? Yes  [place] Scale:  Flowsheet Row ED from 08/05/2023 in [facility] Emergency Department at [facility] ED from 12/17/2022 in [facility] Emergency Department at [facility] ED from 10/24/2022 in [facility] Emergency Department at [facility]  C-SSRS RISK CATEGORY No Risk No Risk No Risk        Substance Abuse:   Patient denies   Past Medical History:  Past Medical History:  Diagnosis Date   A-fib [facility])    Anxiety    Atrial fibrillation with RVR (HCC) 10/15/2017   Auditory hallucinations    Chronic back pain    Dysrhythmia    Atrial fibrillation   Paranoid schizophrenia (HCC)    Psychosis (HCC) 03/25/2014   Schizoaffective disorder (HCC) 03/25/2014   Schizophrenia (HCC)    Urinary incontinence     Past Surgical History:  Procedure Laterality Date   TOOTH EXTRACTION Bilateral 09/08/2018   Procedure: DENTAL RESTORATION/EXTRACTIONS;  Surgeon: Ocie Doyne, DDS;  Location: [facility];  Service: Oral Surgery;  Laterality: Bilateral;   Family History:  Family History  Problem Relation Age of Onset   Hypertension Mother  Diabetes Mother    Family Psychiatric  History: None noted Social History:  Social History   Substance and Sexual Activity  Alcohol Use No     Social History   Substance and Sexual Activity  Drug Use No    Social History   Socioeconomic History   Marital status: Single    Spouse name: Not on file   Number of children: Not on file   Years of education: Not on file   Highest education level: Not on  file  Occupational History   Not on file  Tobacco Use   Smoking status: Never   Smokeless tobacco: Never  Vaping Use   Vaping status: Never Used  Substance and Sexual Activity   Alcohol use: No   Drug use: No   Sexual activity: Not Currently    Birth control/protection: None  Other Topics Concern   Not on file  Social History Narrative   Lives mom, dad, grandmother, and three kids   Social Determinants of Health   Financial Resource Strain: Not on file  Food Insecurity: Not on file  Transportation Needs: Not on file  Physical Activity: Not on file  Stress: Not on file  Social Connections: Not on file   Additional Social History:    Allergies:  No Known Allergies  Labs:  Results for orders placed or performed during the hospital encounter of 08/05/23 (from the past 48 hour(s))  Urine rapid drug screen (hosp performed)     Status: None   Collection Time: 08/05/23  3:42 PM  Result Value Ref Range   Opiates NONE DETECTED NONE DETECTED   Cocaine NONE DETECTED NONE DETECTED   Benzodiazepines NONE DETECTED NONE DETECTED   Amphetamines NONE DETECTED NONE DETECTED   Tetrahydrocannabinol NONE DETECTED NONE DETECTED   Barbiturates NONE DETECTED NONE DETECTED    Comment: (NOTE) DRUG SCREEN FOR MEDICAL PURPOSES ONLY.  IF CONFIRMATION IS NEEDED FOR ANY PURPOSE, NOTIFY LAB WITHIN 5 DAYS.  LOWEST DETECTABLE LIMITS FOR URINE DRUG SCREEN Drug Class                     Cutoff (ng/mL) Amphetamine and metabolites    1000 Barbiturate and metabolites    200 Benzodiazepine                 200 Opiates and metabolites        300 Cocaine and metabolites        300 THC                            50 Performed at [facility], [address], [place], [place] [address]     Current Facility-Administered Medications  Medication Dose Route Frequency Provider Last Rate Last Admin   acetaminophen (TYLENOL) tablet 650 mg  650 mg Oral Q4H PRN Rhunette Croft, Ankit, MD        risperiDONE (RISPERDAL M-TABS) disintegrating tablet 2 mg  2 mg Oral Q8H PRN Nanavati, Ankit, MD       And   LORazepam (ATIVAN) tablet 1 mg  1 mg Oral PRN Nanavati, Ankit, MD       And   ziprasidone (GEODON) injection 20 mg  20 mg Intramuscular PRN Nanavati, Ankit, MD       zolpidem (AMBIEN) tablet 5 mg  5 mg Oral QHS PRN Derwood Kaplan, MD       Current Outpatient Medications  Medication Sig Dispense Refill  aspirin EC 81 MG EC tablet Take 1 tablet (81 mg total) by mouth daily. Swallow whole. (Patient not taking: Reported on 07/04/2022) 30 tablet 11   atorvastatin (LIPITOR) 40 MG tablet Take 1 tablet (40 mg total) by mouth daily. (Patient not taking: Reported on 07/04/2022) 30 tablet 0   benzonatate (TESSALON) 100 MG capsule Take 1 capsule (100 mg total) by mouth every 8 (eight) hours. 21 capsule 0   benztropine (COGENTIN) 0.5 MG tablet Take 1 tablet (0.5 mg total) by mouth 2 (two) times daily as needed for tremors (Extraparametal side effects). (Patient not taking: Reported on 07/04/2022) 60 tablet 0   digoxin (LANOXIN) 0.25 MG tablet Take 1 tablet (0.25 mg total) by mouth daily. 30 tablet 0   diphenhydrAMINE (BENADRYL) 25 mg capsule Take 25 mg by mouth 3 (three) times daily.     guaiFENesin (MUCINEX) 600 MG 12 hr tablet Take 1 tablet (600 mg total) by mouth 2 (two) times daily. 20 tablet 0   haloperidol (HALDOL) 5 MG tablet Take 5 mg by mouth 3 (three) times daily.     hydrOXYzine (ATARAX) 25 MG tablet Take 1 tablet (25 mg total) by mouth 3 (three) times daily as needed for anxiety. (Patient not taking: Reported on 07/04/2022) 30 tablet 0   melatonin 5 MG TABS Take 5 mg by mouth at bedtime as needed (For sleep).     metoprolol succinate (TOPROL-XL) 25 MG 24 hr tablet Take 1 tablet (25 mg total) by mouth daily. (Patient not taking: Reported on 07/04/2022) 30 tablet 0   polyethylene glycol (MIRALAX / GLYCOLAX) 17 g packet Take 17 g by mouth daily. (Patient not taking: Reported on 07/04/2022) 14 each 0    PRESCRIPTION MEDICATION Apply 1 Application topically 3 (three) times daily. Medication: Thermacare Patch Film Topical     risperiDONE (RISPERDAL) 1 MG tablet Take 1 tablet (1 mg total) by mouth 2 (two) times daily. (Patient not taking: Reported on 07/04/2022) 60 tablet 0   traZODone (DESYREL) 50 MG tablet Take 1 tablet (50 mg total) by mouth at bedtime as needed for sleep. (Patient not taking: Reported on 07/04/2022) 30 tablet 30    Musculoskeletal: Strength & Muscle Tone: within normal limits Gait & Station: normal Patient leans: N/A   Psychiatric Specialty Exam: Presentation  General Appearance:  Appropriate for Environment  Eye Contact: Fair  Speech: Garbled  Speech Volume: Normal  Handedness: Right   Mood and Affect  Mood: Euthymic  Affect: Congruent   Thought Process  Thought Processes: Disorganized  Descriptions of Associations:Circumstantial  Orientation:Full (Time, Place and Person)  Thought Content:WDL  History of Schizophrenia/Schizoaffective disorder:Yes  Duration of Psychotic Symptoms:Greater than six months  Hallucinations:Hallucinations: Auditory Description of Auditory Hallucinations: voices tell me to hurt people  Ideas of Reference:None  Suicidal Thoughts:Suicidal Thoughts: No  Homicidal Thoughts:Homicidal Thoughts: No   Sensorium  Memory: Immediate Fair; Recent Fair; Remote Fair  Judgment: Impaired  Insight: Poor   Executive Functions  Concentration: Fair  Attention Span: Fair  Recall: Fair  Fund of Knowledge: Poor  Language: Fair   Psychomotor Activity  Psychomotor Activity: Psychomotor Activity: Normal   Assets  Assets: Desire for Improvement; Leisure Time; Housing    Sleep  Sleep: Sleep: Fair Number of Hours of Sleep: 6   Physical Exam: Physical Exam Vitals and nursing note reviewed.  Constitutional:      Appearance: She is obese.  Eyes:     Pupils: Pupils are equal, round, and reactive  to light.  Pulmonary:  Effort: Pulmonary effort is normal.  Skin:    General: Skin is dry.  Neurological:     Mental Status: She is alert.    Review of Systems  Psychiatric/Behavioral:  Positive for hallucinations.   All other systems reviewed and are negative.  Blood pressure (!) 143/114, pulse 95, temperature 98.5 F (36.9 C), temperature source Oral, resp. rate 10, SpO2 100%. There is no height or weight on file to calculate BMI.  Medical Decision Making: Patient case reviewed and discussed with Dr Viviano Simas.  Patient is experiencing auditory command hallucinations telling her to hurt other people.  Patient is a danger to others and requires inpatient psychiatric hospitalization for stabilization and treatment.     Problem 1: Paranoid schizophrenia -Risperdal 1mg  PO QHS  Disposition: Recommend inpatient psychiatric hospitalization for stabilization and treatment.     Thomes Lolling, NP 08/05/2023 4:32 PM

## 2023-08-05 NOTE — ED Triage Notes (Signed)
[place] the streets, c/o mouth pain, feet and chest pain, and cramps. Found yelling and crying hysterically upon EMS arrival. Yelling out, "I need help, I need help." Stated she is being deprived of feminine products and is bleeding vaginally. Mother arrived on scene, but poor historian.

## 2023-08-06 DIAGNOSIS — I509 Heart failure, unspecified: Secondary | ICD-10-CM | POA: Diagnosis not present

## 2023-08-06 DIAGNOSIS — I4891 Unspecified atrial fibrillation: Secondary | ICD-10-CM | POA: Diagnosis present

## 2023-08-06 DIAGNOSIS — Z7901 Long term (current) use of anticoagulants: Secondary | ICD-10-CM | POA: Diagnosis not present

## 2023-08-06 DIAGNOSIS — F309 Manic episode, unspecified: Secondary | ICD-10-CM | POA: Diagnosis not present

## 2023-08-06 DIAGNOSIS — Z91148 Patient's other noncompliance with medication regimen for other reason: Secondary | ICD-10-CM | POA: Diagnosis not present

## 2023-08-06 DIAGNOSIS — F29 Unspecified psychosis not due to a substance or known physiological condition: Secondary | ICD-10-CM | POA: Diagnosis not present

## 2023-08-06 DIAGNOSIS — R45851 Suicidal ideations: Secondary | ICD-10-CM | POA: Diagnosis not present

## 2023-08-06 DIAGNOSIS — F250 Schizoaffective disorder, bipolar type: Secondary | ICD-10-CM | POA: Diagnosis not present

## 2023-08-06 DIAGNOSIS — D649 Anemia, unspecified: Secondary | ICD-10-CM | POA: Diagnosis not present

## 2023-08-06 NOTE — ED Notes (Signed)
Report given to Karin Golden, Charity fundraiser at [employer]

## 2023-08-06 NOTE — ED Notes (Addendum)
Sheriff called for transport  

## 2023-08-06 NOTE — ED Provider Notes (Signed)
Emergency Medicine Observation Re-evaluation Note  Kathy Brown is a 42 y.o. female, seen on rounds today.  Pt initially presented to the ED for complaints of Manic Behavior Currently, the patient is resting comfortably, awaiting placement for manic behavior.  Physical Exam  BP 118/68 (BP Location: Right Arm)   Pulse 82   Temp 97.6 F (36.4 C) (Oral)   Resp 16   SpO2 99%  Physical Exam General: Awake, resting comfortably Lungs: Normal respiratory effort Psych: Calm, cooperative  ED Course / MDM  EKG:EKG Interpretation Date/Time:  Wednesday August 05 2023 15:23:17 EDT Ventricular Rate:  86 PR Interval:    QRS Duration:  80 QT Interval:  341 QTC Calculation: 408 R Axis:   74  Text Interpretation: Atrial fibrillation When compared with ECG of EARLIER SAME DATE No significant change was found Confirmed by Dione Booze ([id number]) on 08/06/2023 5:06:24 AM  I have reviewed the labs performed to date as well as medications administered while in observation.  Recent changes in the last 24 hours include no changes.  Plan  Current plan is for psychiatric admission. EMTALA performed, patient evaluated and transferred for admission.   Laurence Spates, MD 08/06/23 [phone]

## 2023-08-06 NOTE — ED Notes (Signed)
Called to set up [employer]

## 2023-08-06 NOTE — ED Notes (Signed)
Attempted to call [facility] to give report 3x. No one answered.

## 2023-08-06 NOTE — ED Notes (Signed)
Called [facility] again and spoke to [place], Charity fundraiser. She was giving meds and stated that she would call me back for patient report.

## 2023-08-24 NOTE — Progress Notes (Signed)
*  Viral screening prior to psych admission*   Estelle June DO    Please clarify the medical condition(s) necessitating the order for SARS RSV,Flu A&B   Medical record documentation maintained by the performing physician should clearly indicate the medical necessity of the service being performed.     Please exercise your independent, professional judgment when responding. A specific answer is not anticipated or expected.     Thank You,  Michel Bickers [facility] [place]

## 2023-10-20 ENCOUNTER — Emergency Department (HOSPITAL_COMMUNITY)
Admission: EM | Admit: 2023-10-20 | Discharge: 2023-10-20 | Disposition: A | Discharge status: Home / Self Care | Payer: Medicare Other | Attending: Emergency Medicine | Admitting: Emergency Medicine

## 2023-10-20 ENCOUNTER — Other Ambulatory Visit: Payer: Self-pay

## 2023-10-20 DIAGNOSIS — Z7982 Long term (current) use of aspirin: Secondary | ICD-10-CM | POA: Diagnosis not present

## 2023-10-20 DIAGNOSIS — Z79899 Other long term (current) drug therapy: Secondary | ICD-10-CM | POA: Insufficient documentation

## 2023-10-20 DIAGNOSIS — R58 Hemorrhage, not elsewhere classified: Secondary | ICD-10-CM | POA: Diagnosis not present

## 2023-10-20 DIAGNOSIS — R001 Bradycardia, unspecified: Secondary | ICD-10-CM | POA: Diagnosis not present

## 2023-10-20 DIAGNOSIS — R110 Nausea: Secondary | ICD-10-CM | POA: Insufficient documentation

## 2023-10-20 DIAGNOSIS — R1084 Generalized abdominal pain: Secondary | ICD-10-CM | POA: Diagnosis not present

## 2023-10-20 DIAGNOSIS — I4891 Unspecified atrial fibrillation: Secondary | ICD-10-CM | POA: Diagnosis not present

## 2023-10-20 DIAGNOSIS — R1013 Epigastric pain: Secondary | ICD-10-CM | POA: Diagnosis not present

## 2023-10-20 DIAGNOSIS — R109 Unspecified abdominal pain: Secondary | ICD-10-CM

## 2023-10-20 LAB — CBC WITH DIFFERENTIAL/PLATELET
Abs Immature Granulocytes: 0.01 10*3/uL (ref 0.00–0.07)
Basophils Absolute: 0 10*3/uL (ref 0.0–0.1)
Basophils Relative: 1 %
Eosinophils Absolute: 0 10*3/uL (ref 0.0–0.5)
Eosinophils Relative: 1 %
HCT: 36.8 % (ref 36.0–46.0)
Hemoglobin: 11 g/dL — ABNORMAL LOW (ref 12.0–15.0)
Immature Granulocytes: 0 %
Lymphocytes Relative: 33 %
Lymphs Abs: 1.5 10*3/uL (ref 0.7–4.0)
MCH: 25.6 pg — ABNORMAL LOW (ref 26.0–34.0)
MCHC: 29.9 g/dL — ABNORMAL LOW (ref 30.0–36.0)
MCV: 85.8 fL (ref 80.0–100.0)
Monocytes Absolute: 0.5 10*3/uL (ref 0.1–1.0)
Monocytes Relative: 10 %
Neutro Abs: 2.6 10*3/uL (ref 1.7–7.7)
Neutrophils Relative %: 55 %
Platelets: 225 10*3/uL (ref 150–400)
RBC: 4.29 MIL/uL (ref 3.87–5.11)
RDW: 15.7 % — ABNORMAL HIGH (ref 11.5–15.5)
WBC: 4.7 10*3/uL (ref 4.0–10.5)
nRBC: 0 % (ref 0.0–0.2)

## 2023-10-20 LAB — COMPREHENSIVE METABOLIC PANEL
ALT: 11 U/L (ref 0–44)
AST: 19 U/L (ref 15–41)
Albumin: 3.5 g/dL (ref 3.5–5.0)
Alkaline Phosphatase: 48 U/L (ref 38–126)
Anion gap: 10 (ref 5–15)
BUN: 8 mg/dL (ref 6–20)
CO2: 22 mmol/L (ref 22–32)
Calcium: 8.8 mg/dL — ABNORMAL LOW (ref 8.9–10.3)
Chloride: 107 mmol/L (ref 98–111)
Creatinine, Ser: 0.76 mg/dL (ref 0.44–1.00)
GFR, Estimated: >60 mL/min (ref >60)
Glucose, Bld: 82 mg/dL (ref 70–99)
Potassium: 4 mmol/L (ref 3.5–5.1)
Sodium: 139 mmol/L (ref 135–145)
Total Bilirubin: 0.4 mg/dL (ref <1.2)
Total Protein: 6.7 g/dL (ref 6.5–8.1)

## 2023-10-20 LAB — URINALYSIS, ROUTINE W REFLEX MICROSCOPIC
Bilirubin Urine: NEGATIVE
Glucose, UA: NEGATIVE mg/dL
Hgb urine dipstick: NEGATIVE
Ketones, ur: NEGATIVE mg/dL
Leukocytes,Ua: NEGATIVE
Nitrite: NEGATIVE
Protein, ur: NEGATIVE mg/dL
Specific Gravity, Urine: 1.012 (ref 1.005–1.030)
pH: 5 (ref 5.0–8.0)

## 2023-10-20 LAB — HCG, SERUM, QUALITATIVE: Preg, Serum: NEGATIVE

## 2023-10-20 LAB — LIPASE, BLOOD: Lipase: 32 U/L (ref 11–51)

## 2023-10-20 LAB — DIGOXIN LEVEL: Digoxin Level: <0.2 ng/mL — ABNORMAL LOW (ref 0.8–2.0)

## 2023-10-20 MED ORDER — ONDANSETRON 4 MG PO TBDP
8.0000 mg | ORAL_TABLET | Freq: Once | ORAL | Status: AC
  Administered 2023-10-20: 8 mg via ORAL
  Filled 2023-10-20: qty 2

## 2023-10-20 MED ORDER — ACETAMINOPHEN 500 MG PO TABS
1000.0000 mg | ORAL_TABLET | Freq: Once | ORAL | Status: AC
  Administered 2023-10-20: 1000 mg via ORAL
  Filled 2023-10-20: qty 2

## 2023-10-20 MED ORDER — FAMOTIDINE 20 MG PO TABS
20.0000 mg | ORAL_TABLET | Freq: Once | ORAL | Status: AC
  Administered 2023-10-20: 20 mg via ORAL
  Filled 2023-10-20: qty 1

## 2023-10-20 MED ORDER — ALUM & MAG HYDROXIDE-SIMETH 200-200-20 MG/5ML PO SUSP
30.0000 mL | Freq: Once | ORAL | Status: AC
  Administered 2023-10-20: 30 mL via ORAL
  Filled 2023-10-20: qty 30

## 2023-10-20 NOTE — ED Notes (Signed)
Patient discharged with a bus pass. Patient also given sandwich bag and drink, Patient given opportunity to ask questions. No concerns at this time.

## 2023-10-20 NOTE — ED Notes (Signed)
This RN and another RN tried two times without any success in starting an IV

## 2023-10-20 NOTE — ED Provider Notes (Signed)
  Physical Exam  BP 106/76   Pulse 70   Temp 97.9 F (36.6 C) (Oral)   Resp 17   Ht 5\' 5"  (1.651 m)   Wt 65.8 kg   SpO2 100%   BMI 24.14 kg/m   Physical Exam  Procedures  Procedures  ED Course / MDM   Clinical Course as of 10/20/23 1714  Tue Oct 20, 2023  1446 Digoxin level [JR]  1513 Hx schizoaffective. Here with abd pain / nausea, benign exam. Gi cocktail, sxs improved. On dig. Having episodes of bradycardia. Awaiting dig level. If normal dc [AH]  1713 Digoxin level(!) Patient's oxygen level is not elevated. [AH]    Clinical Course User Index [AH] Arthor Captain, PA-C [JR] Gareth Eagle, PA-C   Medical Decision Making Amount and/or Complexity of Data Reviewed Labs: ordered. Decision-making details documented in ED Course.  Risk OTC drugs. Prescription drug management.   Patient's digoxin level low.  No concerns for dig toxicity.  Patient's abdominal pain is resolved and she is asking to eat.  Appropriate for discharge       Arthor Captain, PA-C 10/20/23 1714    Loetta Rough, MD 10/20/23 [phone]

## 2023-10-20 NOTE — Discharge Instructions (Signed)
Abdominal (belly) pain can be caused by many things. Your caregiver performed an examination and possibly ordered blood/urine tests and imaging (CT scan, x-rays, ultrasound). Many cases can be observed and treated at home after initial evaluation in the emergency department. Even though you are being discharged home, abdominal pain can be unpredictable. Therefore, you need a repeated exam if your pain does not resolve, returns, or worsens. Most patients with abdominal pain don't have to be admitted to the hospital or have surgery, but serious problems like appendicitis and gallbladder attacks can start out as nonspecific pain. Many abdominal conditions cannot be diagnosed in one visit, so follow-up evaluations are very important. °SEEK IMMEDIATE MEDICAL ATTENTION IF: °The pain does not go away or becomes severe.  °A temperature above 101 develops.  °Repeated vomiting occurs (multiple episodes).  °The pain becomes localized to portions of the abdomen. The right side could possibly be appendicitis. In an adult, the left lower portion of the abdomen could be colitis or diverticulitis.  °Blood is being passed in stools or vomit (bright red or black tarry stools).  °Return also if you develop chest pain, difficulty breathing, dizziness or fainting, or become confused, poorly responsive, or inconsolable (young children). ° °

## 2023-10-20 NOTE — ED Triage Notes (Signed)
BIB [facility] [place] [facility] [place]. Patient c/o of abdominal pain and heart burn that has been going on for more than a week. Patient states pain is 10/10, having generalized abdominal pain, no nausea, vomiting.

## 2023-10-20 NOTE — ED Provider Notes (Signed)
[facility] EMERGENCY DEPARTMENT AT [facility] Provider Note   CSN: [id number] Arrival date & time: 10/20/23  1109     History {Add pertinent medical, surgical, social history, OB history to HPI:1} Chief Complaint  Patient presents with   Abdominal Pain   HPI Kathy Brown is a 42 y.o. female with history of schizoaffective disorder, atrial fibrillation presenting for abdominal pain.  States it has been going on for a week but worse this morning.  Primarily in the epigastric region.  It is nonradiating and worse after meals pill.  Feels like a sharp pain.  Endorses nausea but no vomiting or diarrhea.  Denies chest pain and shortness of breath.   Abdominal Pain      Home Medications Prior to Admission medications   Medication Sig Start Date End Date Taking? Authorizing Provider  aspirin EC 81 MG EC tablet Take 1 tablet (81 mg total) by mouth daily. Swallow whole. Patient not taking: Reported on 07/04/2022 01/31/22   Rolly Salter, MD  atorvastatin (LIPITOR) 40 MG tablet Take 1 tablet (40 mg total) by mouth daily. Patient not taking: Reported on 07/04/2022 01/31/22   Rolly Salter, MD  benzonatate (TESSALON) 100 MG capsule Take 1 capsule (100 mg total) by mouth every 8 (eight) hours. 10/24/22   Cristopher Peru, PA-C  benztropine (COGENTIN) 0.5 MG tablet Take 1 tablet (0.5 mg total) by mouth 2 (two) times daily as needed for tremors (Extraparametal side effects). Patient not taking: Reported on 07/04/2022 11/22/21   Estella Husk, MD  digoxin (LANOXIN) 0.25 MG tablet Take 1 tablet (0.25 mg total) by mouth daily. 01/31/22   Rolly Salter, MD  diphenhydrAMINE (BENADRYL) 25 mg capsule Take 25 mg by mouth 3 (three) times daily.    [provider]  guaiFENesin (MUCINEX) 600 MG 12 hr tablet Take 1 tablet (600 mg total) by mouth 2 (two) times daily. 10/24/22   Cristopher Peru, PA-C  haloperidol (HALDOL) 5 MG tablet Take 5 mg by mouth 3 (three) times daily.    [provider]  haloperidol decanoate (HALDOL DECANOATE) 100 MG/ML injection Inject into the muscle every 28 (twenty-eight) days.    [provider]  hydrOXYzine (ATARAX) 25 MG tablet Take 1 tablet (25 mg total) by mouth 3 (three) times daily as needed for anxiety. Patient not taking: Reported on 07/04/2022 11/22/21   Estella Husk, MD  hydrOXYzine (VISTARIL) 25 MG capsule Take 25 mg by mouth 2 (two) times daily. 07/15/23   [provider]  melatonin 5 MG TABS Take 5 mg by mouth at bedtime as needed (For sleep).    [provider]  metoprolol succinate (TOPROL-XL) 25 MG 24 hr tablet Take 1 tablet (25 mg total) by mouth daily. Patient not taking: Reported on 07/04/2022 01/31/22   Rolly Salter, MD  polyethylene glycol (MIRALAX / GLYCOLAX) 17 g packet Take 17 g by mouth daily. Patient not taking: Reported on 07/04/2022 01/31/22   Rolly Salter, MD  PRESCRIPTION MEDICATION Apply 1 Application topically 3 (three) times daily. Medication: Thermacare Patch Film Topical    [provider]  risperiDONE (RISPERDAL) 1 MG tablet Take 1 tablet (1 mg total) by mouth 2 (two) times daily. 01/31/22 08/05/23  Rolly Salter, MD  traZODone (DESYREL) 50 MG tablet Take 1 tablet (50 mg total) by mouth at bedtime as needed for sleep. Patient not taking: Reported on 07/04/2022 01/31/22   Rolly Salter, MD  Allergies    Patient has no known allergies.    Review of Systems   Review of Systems  Gastrointestinal:  Positive for abdominal pain.    Physical Exam Updated Vital Signs BP 136/80 (BP Location: Right Arm)   Pulse (!) 48   Temp 98.4 F (36.9 C) (Oral)   Resp 18   Ht 5\' 5"  (1.651 m)   Wt 65.8 kg   SpO2 100%   BMI 24.14 kg/m  Physical Exam Vitals and nursing note reviewed.  HENT:     Head: Normocephalic and atraumatic.     Mouth/Throat:     Mouth: Mucous membranes are moist.  Eyes:     General:        Right eye: No discharge.        Left eye: No discharge.      Conjunctiva/sclera: Conjunctivae normal.  Cardiovascular:     Rate and Rhythm: Normal rate and regular rhythm.     Pulses: Normal pulses.     Heart sounds: Normal heart sounds.  Pulmonary:     Effort: Pulmonary effort is normal.     Breath sounds: Normal breath sounds.  Abdominal:     General: Abdomen is flat.     Palpations: Abdomen is soft.     Tenderness: There is no abdominal tenderness.  Skin:    General: Skin is warm and dry.  Neurological:     General: No focal deficit present.  Psychiatric:        Mood and Affect: Mood normal.     ED Results / Procedures / Treatments   Labs (all labs ordered are listed, but only abnormal results are displayed) Labs Reviewed - No data to display  EKG None  Radiology No results found.  Procedures Procedures  {Document cardiac monitor, telemetry assessment procedure when appropriate:1}  Medications Ordered in ED Medications - No data to display  ED Course/ Medical Decision Making/ A&P   {   Click here for ABCD2, HEART and other calculatorsREFRESH Note before signing :1}                              Medical Decision Making Amount and/or Complexity of Data Reviewed Labs: ordered.  Risk OTC drugs. Prescription drug management.   ***  {Document critical care time when appropriate:1} {Document review of labs and clinical decision tools ie heart score, Chads2Vasc2 etc:1}  {Document your independent review of radiology images, and any outside records:1} {Document your discussion with family members, caretakers, and with consultants:1} {Document social determinants of health affecting pt's care:1} {Document your decision making why or why not admission, treatments were needed:1} Final Clinical Impression(s) / ED Diagnoses Final diagnoses:  None    Rx / DC Orders ED Discharge Orders     None
